# Patient Record
Sex: Female | Born: 1990 | Race: White | Hispanic: No | Marital: Married | Smoking: Current every day smoker
Health system: Southern US, Community
[De-identification: ages and names within clinical notes are randomized; demographics above are authoritative.]

## PROBLEM LIST (undated history)

## (undated) ENCOUNTER — Inpatient Hospital Stay (HOSPITAL_COMMUNITY): Payer: Self-pay

## (undated) DIAGNOSIS — F419 Anxiety disorder, unspecified: Secondary | ICD-10-CM

## (undated) DIAGNOSIS — N3010 Interstitial cystitis (chronic) without hematuria: Secondary | ICD-10-CM

## (undated) DIAGNOSIS — M797 Fibromyalgia: Secondary | ICD-10-CM

## (undated) DIAGNOSIS — K219 Gastro-esophageal reflux disease without esophagitis: Secondary | ICD-10-CM

## (undated) DIAGNOSIS — K589 Irritable bowel syndrome without diarrhea: Secondary | ICD-10-CM

## (undated) DIAGNOSIS — E039 Hypothyroidism, unspecified: Secondary | ICD-10-CM

## (undated) DIAGNOSIS — R011 Cardiac murmur, unspecified: Secondary | ICD-10-CM

## (undated) DIAGNOSIS — E063 Autoimmune thyroiditis: Secondary | ICD-10-CM

## (undated) DIAGNOSIS — F411 Generalized anxiety disorder: Secondary | ICD-10-CM

## (undated) DIAGNOSIS — F32A Depression, unspecified: Secondary | ICD-10-CM

## (undated) DIAGNOSIS — F988 Other specified behavioral and emotional disorders with onset usually occurring in childhood and adolescence: Secondary | ICD-10-CM

## (undated) HISTORY — PX: NO PAST SURGERIES: SHX2092

## (undated) HISTORY — PX: APPENDECTOMY: SHX54

## (undated) HISTORY — PX: UPPER GI ENDOSCOPY: SHX6162

## (undated) HISTORY — PX: MYRINGOTOMY WITH TUBE PLACEMENT: SHX5663

## (undated) HISTORY — PX: OTHER SURGICAL HISTORY: SHX169

## (undated) HISTORY — PX: TYMPANOSTOMY TUBE PLACEMENT: SHX32

---
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
## 2011-01-20 ENCOUNTER — Encounter (HOSPITAL_COMMUNITY): Payer: Self-pay | Admitting: Psychiatry

## 2011-04-02 NOTE — Procedures (Signed)
EEG NUMBER:  [id number].   CLINICAL HISTORY:  A 20 year old with a bitemporal headaches and syncope.  Study is being done to look for the presence of seizures.   PROCEDURE:  The tracing is carried out on a 32-channel digital Cadwell  recorder reformatted to 16-channel montages with one devoted to EKG. The  patient was awake during the recording. The International 10/20 system of  lead placement used. The patient takes Topamax for her headaches.   DESCRIPTION OF FINDINGS:  Dominant frequency is 10 Hz 20 microvolt activity,  is well regulated, background activity is a mixture of under 20 microvolt  theta range components and frontally predominant beta.  Hyperventilation and  photic stimulation caused no significant change in background. The patient  does not change state of arousal.   EKG showed a regular sinus rhythm with ventricular response of 72 beats per  minute.   IMPRESSION:  Normal waking record.      Deanna Artis. Sharene Skeans, M.D.  Electronically Signed     ZOX:WRUE  D:  12/28/2005 22:50:08  T:  12/29/2005 11:37:41  Job #:  [id number]
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
## 2011-04-02 NOTE — Discharge Summary (Signed)
Linda [name], Linda [name] NO.:  [id number]   MEDICAL RECORD NO.:  [id number]           PATIENT TYPE:  OBV   LOCATION:  A315                          FACILITY:  [facility]   PHYSICIAN:  Donna Bernard, M.D.DATE OF BIRTH:  May 07, 1991   DATE OF ADMISSION:  DATE OF DISCHARGE:  01/10/2007LH                                 DISCHARGE SUMMARY   FINAL DIAGNOSES:  1.  Near syncope.  2.  Hyperventilation.  3.  Acute anxiety.  4.  Headaches.   DISPOSITION:  1.  Patient is discharged home.  2.  Patient to remain out of school today and return tomorrow.  3.  Follow up with our office as scheduled.  4.  Xanax 0.5 mg #15 one as needed for anxiety, may repeat one in 6 hours.      Use sparingly.   HISTORY AND PHYSICAL:  For initial H&P, please see H&P as dictated.   HOSPITAL COURSE:  This patient is a 20 year old white female who has had  several spells in the last six weeks that sounded pretty much like vasovagal  episodes accompanied by near syncope and high anxiety and headaches.  She  was in the midst of workup.  We were due to do an MRI and MRA soon on her  for this.  She is also due to see a Neurologist soon.  She came to the  emergency room after having suffered a particularly severe spell (please see  H&P for further details).  When ER was preparing to discharge her, the  patient nearly fell out and nearly passed out and developed crying and  shaking and was brought in for observation.  I saw the patient that evening  and literally sat at the bedside for an entire hour discussing with the  family the nature of vasovagal episodes and the many varying symptoms that  can occur with this.  I feel this is a high likelihood.  The patient has  done well in the hospital.  We did use low-dose Xanax and Reglan and  __________ for symptomatology.  Today, the patient is feeling much better.  Her appetite is back.  Her neurological and physiological exam is within  normal limits.  The  patient is discharged home.   DIAGNOSIS:  Same.   DISPOSITION:  As noted above.     Donna Bernard, M.D.  Electronically Signed    WSL/MEDQ  D:  11/24/2005  T:  11/24/2005  Job:  [id number]

## 2011-09-08 ENCOUNTER — Encounter (INDEPENDENT_AMBULATORY_CARE_PROVIDER_SITE_OTHER): Payer: 59 | Admitting: Psychiatry

## 2011-09-08 DIAGNOSIS — F909 Attention-deficit hyperactivity disorder, unspecified type: Secondary | ICD-10-CM

## 2011-10-13 ENCOUNTER — Encounter (HOSPITAL_COMMUNITY): Payer: 59 | Admitting: Psychiatry

## 2011-10-16 ENCOUNTER — Other Ambulatory Visit (HOSPITAL_COMMUNITY): Payer: Self-pay | Admitting: Psychiatry

## 2012-02-26 ENCOUNTER — Emergency Department (HOSPITAL_COMMUNITY)
Admission: EM | Admit: 2012-02-26 | Discharge: 2012-02-27 | Disposition: A | Discharge status: Home / Self Care | Payer: No Typology Code available for payment source | Attending: Emergency Medicine | Admitting: Emergency Medicine

## 2012-02-26 ENCOUNTER — Emergency Department (HOSPITAL_COMMUNITY): Payer: No Typology Code available for payment source

## 2012-02-26 ENCOUNTER — Encounter (HOSPITAL_COMMUNITY): Payer: Self-pay | Admitting: *Deleted

## 2012-02-26 DIAGNOSIS — R42 Dizziness and giddiness: Secondary | ICD-10-CM | POA: Insufficient documentation

## 2012-02-26 DIAGNOSIS — S139XXA Sprain of joints and ligaments of unspecified parts of neck, initial encounter: Secondary | ICD-10-CM | POA: Insufficient documentation

## 2012-02-26 DIAGNOSIS — S0990XA Unspecified injury of head, initial encounter: Secondary | ICD-10-CM | POA: Insufficient documentation

## 2012-02-26 DIAGNOSIS — M542 Cervicalgia: Secondary | ICD-10-CM | POA: Insufficient documentation

## 2012-02-26 DIAGNOSIS — S161XXA Strain of muscle, fascia and tendon at neck level, initial encounter: Secondary | ICD-10-CM

## 2012-02-26 DIAGNOSIS — R51 Headache: Secondary | ICD-10-CM | POA: Insufficient documentation
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
## 2012-02-27 MED ORDER — HYDROCODONE-ACETAMINOPHEN 5-325 MG PO TABS: 1.0000 | ORAL_TABLET | Freq: Four times a day (QID) | ORAL | Status: AC | PRN

## 2012-02-27 MED ORDER — HYDROCODONE-ACETAMINOPHEN 5-325 MG PO TABS
1.0000 | ORAL_TABLET | Freq: Once | ORAL | Status: DC
  Filled 2012-02-27: qty 1

## 2012-02-27 MED ORDER — NAPROXEN 500 MG PO TABS: 500.0000 mg | ORAL_TABLET | Freq: Two times a day (BID) | ORAL | Status: DC
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
## 2012-03-07 ENCOUNTER — Ambulatory Visit (HOSPITAL_COMMUNITY)
Admission: RE | Admit: 2012-03-07 | Discharge: 2012-03-07 | Disposition: A | Discharge status: Home / Self Care | Payer: No Typology Code available for payment source | Source: Ambulatory Visit | Attending: Family Medicine | Admitting: Family Medicine

## 2012-03-07 DIAGNOSIS — M542 Cervicalgia: Secondary | ICD-10-CM | POA: Insufficient documentation

## 2012-03-07 DIAGNOSIS — IMO0002 Reserved for concepts with insufficient information to code with codable children: Secondary | ICD-10-CM | POA: Insufficient documentation

## 2012-03-07 DIAGNOSIS — M25519 Pain in unspecified shoulder: Secondary | ICD-10-CM | POA: Insufficient documentation

## 2012-03-07 DIAGNOSIS — S161XXA Strain of muscle, fascia and tendon at neck level, initial encounter: Secondary | ICD-10-CM | POA: Insufficient documentation

## 2012-03-07 DIAGNOSIS — IMO0001 Reserved for inherently not codable concepts without codable children: Secondary | ICD-10-CM | POA: Insufficient documentation

## 2012-03-07 DIAGNOSIS — M6281 Muscle weakness (generalized): Secondary | ICD-10-CM | POA: Insufficient documentation
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
## 2012-03-09 ENCOUNTER — Ambulatory Visit (HOSPITAL_COMMUNITY)
Admission: RE | Admit: 2012-03-09 | Discharge: 2012-03-09 | Disposition: A | Discharge status: Home / Self Care | Payer: No Typology Code available for payment source | Source: Ambulatory Visit | Attending: Family Medicine | Admitting: Family Medicine

## 2012-03-09 DIAGNOSIS — IMO0002 Reserved for concepts with insufficient information to code with codable children: Secondary | ICD-10-CM

## 2012-03-09 DIAGNOSIS — S161XXA Strain of muscle, fascia and tendon at neck level, initial encounter: Secondary | ICD-10-CM

## 2012-03-09 DIAGNOSIS — M25519 Pain in unspecified shoulder: Secondary | ICD-10-CM

## 2012-03-09 NOTE — Progress Notes (Signed)
Occupational Therapy Treatment  Patient Details  Name: Linda [name] MRN: [id number] Date of Birth: 1991/05/26  Today's Date: 03/09/2012 Time: 9147-8295 Time Calculation (min): 38 min Manual Therapy 38' Visit#: 2  of 8   Re-eval: 04/04/12    Subjective Symptoms/Limitations Symptoms: S:  I did the exercises, they made me sore, but not too sore. Pain Assessment Currently in Pain?: Yes Pain Score:   5 Pain Location: Shoulder Pain Orientation: Left Pain Type: Acute pain  Precautions/Restrictions     Exercise/Treatments    Manual Therapy Manual Therapy: Myofascial release Myofascial Release: MFR to platysmus, bilateral SCM, trapezius, pectoral region, chest release, scapular release, cervcial and bilateral arm manual traction, suboccipital release (vetebral artery test completed) completed this date to decrease pain and restrictions and increase pain free mobility in her cervical and shoulder regions.  [phone]  Occupational Therapy Assessment and Plan OT Assessment and Plan Clinical Impression Statement: A:  Patient with decreased pain and increased mobility this date.  Discussed making a conscious effort to swing arms when walking and to turn at neck rather than waist when looking to right and left.  Patient visually less guarded and more relaxed after MFR session. OT Plan: P:  Continue MFR to decrease pain and restrictions.  Add therapeutic exercises.   Goals Short Term Goals Time to Complete Short Term Goals: 2 weeks Short Term Goal 1: Patient will be educated on HEP. Short Term Goal 2: Patient will decrease fascial restrictions from moderate to min mod in her cervical and shoulder regions. Short Term Goal 3: Patient will decrease pain in cervical and left shoulder region to 4/10 when styling her hair. Short Term Goal 4: Patient will rotate at neck rather than hips when looking right or left 50% of the time. Short Term Goal 5: Patient will increase left shoulder AROM by  50% for increased ability to fasten her bra and comb her hair. Long Term Goals Time to Complete Long Term Goals: 4 weeks Long Term Goal 1: Patient will return to full duties at work and school. Long Term Goal 2: Patient will decrease fascial restrictions to minimal in her cervical and shoulder region. Long Term Goal 3: Patient will decrease pain to 1-2/10 in her cervical and shoulder regions while completing work and school activities. Long Term Goal 4: Patient will increase neck and shoulder AROM to WNL for increased ability to complete ADLs. Long Term Goal 5: Patient will increase left shoulder strength to 5/5 for increased ability to lift boxes at work. Additional Long Term Goals?: Yes Long Term Goal 6: Patient will rotate at neck rather than hips when looking right or left 100% of the time.  Problem List Patient Active Problem List  Diagnoses  . Cervical strain  . Sprain and strain of unspecified site of shoulder and upper arm  . Pain in joint, shoulder region    End of Session Activity Tolerance: Patient tolerated treatment well General Behavior During Session: [facility] for tasks performed Cognition: [facility] for tasks performed  GO No functional reporting required  Shirlean Mylar, OTR/L  03/09/2012, 9:47 AM

## 2012-03-14 ENCOUNTER — Ambulatory Visit (HOSPITAL_COMMUNITY)
Admission: RE | Admit: 2012-03-14 | Discharge: 2012-03-14 | Disposition: A | Discharge status: Home / Self Care | Payer: No Typology Code available for payment source | Source: Ambulatory Visit | Attending: Family Medicine | Admitting: Family Medicine

## 2012-03-14 DIAGNOSIS — IMO0002 Reserved for concepts with insufficient information to code with codable children: Secondary | ICD-10-CM

## 2012-03-14 DIAGNOSIS — M25519 Pain in unspecified shoulder: Secondary | ICD-10-CM

## 2012-03-14 DIAGNOSIS — S161XXA Strain of muscle, fascia and tendon at neck level, initial encounter: Secondary | ICD-10-CM

## 2012-03-14 NOTE — Progress Notes (Signed)
Occupational Therapy Treatment  Patient Details  Name: Linda [name] MRN: [id number] Date of Birth: Apr 09, 1991  Today's Date: 03/14/2012 Time: 1191-4782 Time Calculation (min): 69 min  Visit#: 3  of 8   Re-eval: 04/04/12 Manual Therapy 852-930 38' Therapeutic Exercise [id number] 31' Assessment Diagnosis: Cervical and Left Shoulder Strain Next MD Visit: 04/03/12  Subjective Symptoms/Limitations Symptoms: S:  It is a little better  Precautions/Restrictions     Exercise/Treatments Supine Protraction: PROM;AAROM;10 reps Horizontal ABduction: PROM;AAROM;10 reps External Rotation: PROM;AAROM;10 reps Internal Rotation: PROM;AAROM;10 reps Flexion: PROM;AAROM;10 reps ABduction: PROM;AAROM;10 reps Seated Elevation: AROM;10 reps Extension: AROM;10 reps Retraction: AROM;10 reps Row: AROM;10 reps   Therapy Ball Flexion: 10 reps ABduction: 10 reps   Cervical Exercises  Neck Flexion: AROM;10 reps Neck Extension: AROM;10 reps Neck Retraction: AROM;10 reps Neck Lateral Flexion - Right: AROM;10 reps Neck Lateral Flexion - Left: AROM;10 reps Neck Rotation - Right: AROM;10 reps Neck Rotation - Left: AROM;10 reps     Manual Therapy Manual Therapy: Myofascial release Myofascial Release: MFR and manual stretching to bilateral UE, cross hand chest release, scapular release, bilateral SCM and suboccipital release to decrease pain and restrictions and increase pain  free AROM and return to ADL activities.  Occupational Therapy Assessment and Plan OT Assessment and Plan Clinical Impression Statement: A:  Added multiple new exercises which patient tolerated well and combination of manual and exercise decreased pain to 1/10 Rehab Potential: Excellent OT Plan: P:  Increase reps with AAROM and AROM.   Goals Short Term Goals Time to Complete Short Term Goals: 2 weeks Short Term Goal 1: Patient will be educated on HEP. Short Term Goal 2: Patient will decrease fascial restrictions  from moderate to min mod in her cervical and shoulder regions. Short Term Goal 3: Patient will decrease pain in cervical and left shoulder region to 4/10 when styling her hair. Short Term Goal 4: Patient will rotate at neck rather than hips when looking right or left 50% of the time. Short Term Goal 5: Patient will increase left shoulder AROM by 50% for increased ability to fasten her bra and comb her hair. Long Term Goals Time to Complete Long Term Goals: 4 weeks Long Term Goal 1: Patient will return to full duties at work and school. Long Term Goal 2: Patient will decrease fascial restrictions to minimal in her cervical and shoulder region. Long Term Goal 3: Patient will decrease pain to 1-2/10 in her cervical and shoulder regions while completing work and school activities. Long Term Goal 4: Patient will increase neck and shoulder AROM to WNL for increased ability to complete ADLs. Long Term Goal 5: Patient will increase left shoulder strength to 5/5 for increased ability to lift boxes at work. Additional Long Term Goals?: Yes Long Term Goal 6: Patient will rotate at neck rather than hips when looking right or left 100% of the time.  Problem List Patient Active Problem List  Diagnoses  . Cervical strain  . Sprain and strain of unspecified site of shoulder and upper arm  . Pain in joint, shoulder region    End of Session Activity Tolerance: Patient tolerated treatment well General Behavior During Session: [facility] for tasks performed Cognition: [facility] for tasks performed  GO No functional reporting required   Dequavious Harshberger L. Sourish Allender, COTA/L  03/14/2012, 1:46 PM

## 2012-03-16 ENCOUNTER — Ambulatory Visit (HOSPITAL_COMMUNITY)
Admission: RE | Admit: 2012-03-16 | Discharge: 2012-03-16 | Disposition: A | Discharge status: Home / Self Care | Payer: No Typology Code available for payment source | Source: Ambulatory Visit | Attending: Family Medicine | Admitting: Family Medicine

## 2012-03-16 DIAGNOSIS — IMO0001 Reserved for inherently not codable concepts without codable children: Secondary | ICD-10-CM | POA: Insufficient documentation

## 2012-03-16 DIAGNOSIS — M6281 Muscle weakness (generalized): Secondary | ICD-10-CM | POA: Insufficient documentation

## 2012-03-16 DIAGNOSIS — S161XXA Strain of muscle, fascia and tendon at neck level, initial encounter: Secondary | ICD-10-CM

## 2012-03-16 DIAGNOSIS — M25519 Pain in unspecified shoulder: Secondary | ICD-10-CM | POA: Insufficient documentation

## 2012-03-16 DIAGNOSIS — M542 Cervicalgia: Secondary | ICD-10-CM | POA: Insufficient documentation

## 2012-03-16 DIAGNOSIS — IMO0002 Reserved for concepts with insufficient information to code with codable children: Secondary | ICD-10-CM

## 2012-03-16 NOTE — Progress Notes (Signed)
Occupational Therapy Treatment Patient Details  Name: Linda [name] MRN: [id number] Date of Birth: Jul 15, 1991  Today's Date: 03/16/2012 Time: 4696-2952 OT Time Calculation (min): 64 min Manual Therapy [phone] 27' Therapeutic Exercises [id number] 37' Visit#: 4  of 8   Re-eval: 04/04/12     Subjective Symptoms/Limitations Symptoms: S:  I feel like I am 80% better. Pain Assessment Currently in Pain?: No/denies Pain Score: 0-No pain  Precautions/Restrictions   N/A  Exercise/Treatments Supine Protraction: PROM;AROM;10 reps Horizontal ABduction: PROM;AROM;10 reps External Rotation: PROM;AROM;10 reps Internal Rotation: PROM;AROM;10 reps Flexion: PROM;AROM;10 reps ABduction: PROM;AROM;10 reps Seated Elevation: AROM;10 reps Extension: 10 reps;Theraband (red) Retraction: 10 reps;Theraband (red) Row: 10 reps;Theraband (red) Protraction: AROM;10 reps Horizontal ABduction: AROM;10 reps External Rotation: AROM;10 reps Internal Rotation: AROM;10 reps Flexion: AROM;10 reps Abduction: AROM;10 reps Therapy Ball Flexion: 15 reps ABduction: 15 reps ROM / Strengthening / Isometric Strengthening UBE (Upper Arm Bike): 3' and 3' 1.5 Thumb Tacks: 1' "W" Arms: 10 X to V Arms: 10 Prot/Ret//Elev/Dep: 1'   Cervical Exercises  Neck Flexion: AROM;10 reps Neck Extension: AROM;10 reps Neck Retraction: AROM;10 reps Neck Lateral Flexion - Right: AROM;10 reps Neck Lateral Flexion - Left: AROM;10 reps Neck Rotation - Right: AROM;10 reps Neck Rotation - Left: AROM;10 reps   Manual Therapy Manual Therapy: Myofascial release Myofascial Release: MFR and manual stretching to bilateral upper extremities, scapular release, bilateral SCM and suboccipital relese, manual traction to cervical and BUE.  [phone]  Occupational Therapy Assessment and Plan OT Assessment and Plan Clinical Impression Statement: A:  Patient using neck rotation more so than trunk rotation when looking right and left.  Less  guarding and more fluid arm movements when walking. OT Plan: P:  Attempt 1# in supine.   Goals Short Term Goals Time to Complete Short Term Goals: 2 weeks Short Term Goal 1: Patient will be educated on HEP. Short Term Goal 1 Progress: Progressing toward goal Short Term Goal 2: Patient will decrease fascial restrictions from moderate to min mod in her cervical and shoulder regions. Short Term Goal 2 Progress: Progressing toward goal Short Term Goal 3: Patient will decrease pain in cervical and left shoulder region to 4/10 when styling her hair. Short Term Goal 3 Progress: Progressing toward goal Short Term Goal 4: Patient will rotate at neck rather than hips when looking right or left 50% of the time. Short Term Goal 4 Progress: Progressing toward goal Short Term Goal 5: Patient will increase left shoulder AROM by 50% for increased ability to fasten her bra and comb her hair. Short Term Goal 5 Progress: Progressing toward goal Long Term Goals Time to Complete Long Term Goals: 4 weeks Long Term Goal 1: Patient will return to full duties at work and school. Long Term Goal 1 Progress: Progressing toward goal Long Term Goal 2: Patient will decrease fascial restrictions to minimal in her cervical and shoulder region. Long Term Goal 2 Progress: Progressing toward goal Long Term Goal 3: Patient will decrease pain to 1-2/10 in her cervical and shoulder regions while completing work and school activities. Long Term Goal 3 Progress: Progressing toward goal Long Term Goal 4: Patient will increase neck and shoulder AROM to WNL for increased ability to complete ADLs. Long Term Goal 4 Progress: Progressing toward goal Long Term Goal 5: Patient will increase left shoulder strength to 5/5 for increased ability to lift boxes at work. Long Term Goal 5 Progress: Progressing toward goal Additional Long Term Goals?: Yes Long Term Goal 6: Patient will rotate at  neck rather than hips when looking right or left  100% of the time. Long Term Goal 6 Progress: Progressing toward goal  Problem List Patient Active Problem List  Diagnoses  . Cervical strain  . Sprain and strain of unspecified site of shoulder and upper arm  . 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  GO No functional reporting required  Shirlean Mylar, OTR/L  03/16/2012, 10:17 AM

## 2012-03-21 ENCOUNTER — Ambulatory Visit (HOSPITAL_COMMUNITY): Payer: No Typology Code available for payment source | Admitting: Occupational Therapy

## 2012-03-23 ENCOUNTER — Ambulatory Visit (HOSPITAL_COMMUNITY)
Admission: RE | Admit: 2012-03-23 | Discharge: 2012-03-23 | Disposition: A | Discharge status: Home / Self Care | Payer: No Typology Code available for payment source | Source: Ambulatory Visit | Attending: Family Medicine | Admitting: Family Medicine

## 2012-03-23 DIAGNOSIS — M25519 Pain in unspecified shoulder: Secondary | ICD-10-CM

## 2012-03-23 DIAGNOSIS — IMO0002 Reserved for concepts with insufficient information to code with codable children: Secondary | ICD-10-CM

## 2012-03-23 DIAGNOSIS — S161XXA Strain of muscle, fascia and tendon at neck level, initial encounter: Secondary | ICD-10-CM

## 2012-03-23 NOTE — Progress Notes (Signed)
Occupational Therapy Treatment Patient Details  Name: Linda [name] MRN: [id number] Date of Birth: 31-Jul-1991  Today's Date: 03/23/2012 Time: 4098-1191 OT Time Calculation (min): 58 min Manual THerapy 852-920 28' Therapeutic Exercises 920-950 30' Visit#: 5  of 8   Re-eval: 04/04/12    Subjective Symptoms/Limitations Symptoms: S:  I feel like I am 90% better. Pain Assessment Currently in Pain?: No/denies Pain Score: 0-No pain Pain Orientation: Left Pain Type: Acute pain O:  Exercise/Treatments Seated Extension: 15 reps;Theraband (green) Retraction: 15 reps;Theraband (green) Row: 15 reps;Theraband (green) Protraction: Strengthening;10 reps Protraction Weight (lbs): 1 Horizontal ABduction: Strengthening;10 reps Horizontal ABduction Weight (lbs): 1 External Rotation: Strengthening;10 reps External Rotation Weight (lbs): 1 Internal Rotation: Strengthening;10 reps Internal Rotation Weight (lbs): 1 Flexion: Strengthening;10 reps Flexion Weight (lbs): 1 Abduction: Strengthening;10 reps ABduction Weight (lbs): 1 Therapy Ball Flexion: Other (comment) (dc) ABduction: Other (comment) (dc) ROM / Strengthening / Isometric Strengthening UBE (Upper Arm Bike): 3' and 3' 2.0 Cybex Press: 1.5 plate;15 reps Cybex Row: 1.5 plate;15 reps Thumb Tacks: 1' "W" Arms: 1# x 15 X to V Arms: 1# x 15 Prot/Ret//Elev/Dep: 1'      Manual Therapy Manual Therapy: Myofascial release Myofascial Release: MFR and manual stretching to bilateral upper extremities, scapular release, bilateral SCM and suboccipital release, manual traction to cervical region and LUE.  [phone]  Occupational Therapy Assessment and Plan OT Assessment and Plan Clinical Impression Statement: A: Minimal fascial restrictions noted this date.  Transitioned to strengthening rather than mobility exercises. OT Plan: P:  Increase to 2# with seated strengthening as tolerated.   Goals Short Term Goals Time to Complete Short  Term Goals: 2 weeks Short Term Goal 1: Patient will be educated on HEP. Short Term Goal 1 Progress: Progressing toward goal Short Term Goal 2: Patient will decrease fascial restrictions from moderate to min mod in her cervical and shoulder regions. Short Term Goal 2 Progress: Progressing toward goal Short Term Goal 3: Patient will decrease pain in cervical and left shoulder region to 4/10 when styling her hair. Short Term Goal 3 Progress: Progressing toward goal Short Term Goal 4: Patient will rotate at neck rather than hips when looking right or left 50% of the time. Short Term Goal 4 Progress: Progressing toward goal Short Term Goal 5: Patient will increase left shoulder AROM by 50% for increased ability to fasten her bra and comb her hair. Short Term Goal 5 Progress: Progressing toward goal Long Term Goals Time to Complete Long Term Goals: 4 weeks Long Term Goal 1: Patient will return to full duties at work and school. Long Term Goal 1 Progress: Progressing toward goal Long Term Goal 2: Patient will decrease fascial restrictions to minimal in her cervical and shoulder region. Long Term Goal 2 Progress: Progressing toward goal Long Term Goal 3: Patient will decrease pain to 1-2/10 in her cervical and shoulder regions while completing work and school activities. Long Term Goal 3 Progress: Progressing toward goal Long Term Goal 4: Patient will increase neck and shoulder AROM to WNL for increased ability to complete ADLs. Long Term Goal 4 Progress: Progressing toward goal Long Term Goal 5: Patient will increase left shoulder strength to 5/5 for increased ability to lift boxes at work. Long Term Goal 5 Progress: Progressing toward goal Additional Long Term Goals?: Yes Long Term Goal 6: Patient will rotate at neck rather than hips when looking right or left 100% of the time. Long Term Goal 6 Progress: Progressing toward goal  Problem List Patient Active  Problem List  Diagnoses  . Cervical  strain  . Sprain and strain of unspecified site of shoulder and upper arm  . Pain in joint, shoulder region    End of Session Activity Tolerance: Patient tolerated treatment well General Behavior During Session: [facility] for tasks performed Cognition: [facility] for tasks performed  GO No functional reporting required  Shirlean Mylar, OTR/L  03/23/2012, 10:30 AM

## 2012-03-28 ENCOUNTER — Telehealth (HOSPITAL_COMMUNITY): Payer: Self-pay

## 2012-03-28 ENCOUNTER — Ambulatory Visit (HOSPITAL_COMMUNITY): Payer: No Typology Code available for payment source | Admitting: Occupational Therapy

## 2012-03-30 ENCOUNTER — Ambulatory Visit (HOSPITAL_COMMUNITY): Payer: No Typology Code available for payment source | Admitting: Specialist

## 2012-04-04 ENCOUNTER — Ambulatory Visit (HOSPITAL_COMMUNITY): Payer: No Typology Code available for payment source | Admitting: Occupational Therapy

## 2012-04-06 ENCOUNTER — Ambulatory Visit (HOSPITAL_COMMUNITY): Payer: No Typology Code available for payment source | Admitting: Specialist

## 2012-04-11 ENCOUNTER — Ambulatory Visit (HOSPITAL_COMMUNITY)
Admission: RE | Admit: 2012-04-11 | Discharge: 2012-04-11 | Disposition: A | Discharge status: Home / Self Care | Payer: No Typology Code available for payment source | Source: Ambulatory Visit | Attending: Family Medicine | Admitting: Family Medicine

## 2012-04-11 ENCOUNTER — Ambulatory Visit (HOSPITAL_COMMUNITY): Payer: No Typology Code available for payment source | Admitting: Occupational Therapy

## 2012-04-11 DIAGNOSIS — S161XXA Strain of muscle, fascia and tendon at neck level, initial encounter: Secondary | ICD-10-CM

## 2012-04-11 DIAGNOSIS — IMO0002 Reserved for concepts with insufficient information to code with codable children: Secondary | ICD-10-CM

## 2012-04-11 DIAGNOSIS — M25519 Pain in unspecified shoulder: Secondary | ICD-10-CM

## 2012-04-11 NOTE — Progress Notes (Signed)
Occupational Therapy Treatment Patient Details  Name: JELITZA MANNINEN MRN: [id number] Date of Birth: September 01, 1991  Today's Date: 04/11/2012 Time: 4098-1191 OT Time Calculation (min): 22 min  Visit#: 6  of 8   Re-eval:   Assessment Diagnosis: Cervical and Left Shoulder Strain   Subjective Symptoms/Limitations Symptoms: S:  I havn't had any pain.  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 Pain Assessment Currently in Pain?: No/denies  Precautions/Restrictions     Occupational Therapy Assessment and Plan OT Assessment and Plan Clinical Impression Statement: A:  See progress note Rehab Potential: Excellent OT Plan: P:  D/C to HEP   Goals Short Term Goals Time to Complete Short Term Goals: 2 weeks Short Term Goal 1: Patient will be educated on HEP. Short Term Goal 1 Progress: Met Short Term Goal 2: Patient will decrease fascial restrictions from moderate to min mod in her cervical and shoulder regions. Short Term Goal 2 Progress: Met Short Term Goal 3: Patient will decrease pain in cervical and left shoulder region to 4/10 when styling her hair. Short Term Goal 3 Progress: Met Short Term Goal 4: Patient will rotate at neck rather than hips when looking right or left 50% of the time. Short Term Goal 4 Progress: Met Short Term Goal 5: Patient will increase left shoulder AROM by 50% for increased ability to fasten her bra and comb her hair. Short Term Goal 5 Progress: Met Long Term Goals Time to Complete Long Term Goals: 4 weeks Long Term Goal 1: Patient will return to full duties at work and school. Long Term Goal 1 Progress: Met Long Term Goal 2: Patient will decrease fascial restrictions to minimal in her cervical and shoulder region. Long Term Goal 2 Progress: Met Long Term Goal 3: Patient will decrease pain to 1-2/10 in her cervical and shoulder regions while completing work and school activities. Long Term Goal 3 Progress: Met Long Term Goal 4: Patient will increase neck and  shoulder AROM to WNL for increased ability to complete ADLs. Long Term Goal 4 Progress: Met Long Term Goal 5: Patient will increase left shoulder strength to 5/5 for increased ability to lift boxes at work. Additional Long Term Goals?: Yes Long Term Goal 6: Patient will rotate at neck rather than hips when looking right or left 100% of the time. Long Term Goal 6 Progress: Met  Problem List Patient Active Problem List  Diagnoses  . Cervical strain  . Sprain and strain of unspecified site of shoulder and upper arm  . Pain in joint, shoulder region    End of Session Activity Tolerance: Patient tolerated treatment well General Behavior During Session: [facility] for tasks performed Cognition: [facility] for tasks performed OT Plan of Care OT Home Exercise Plan: Educated on cervical stretches.  Written, demonstration and verbal instructions given to patient.    Trai Ells L. Butch Otterson, COTA/L  04/11/2012, 10:04 AM

## 2012-04-13 ENCOUNTER — Ambulatory Visit (HOSPITAL_COMMUNITY): Payer: No Typology Code available for payment source | Admitting: Specialist

## 2012-06-20 ENCOUNTER — Other Ambulatory Visit: Payer: Self-pay | Admitting: Family Medicine

## 2012-06-20 DIAGNOSIS — R102 Pelvic and perineal pain: Secondary | ICD-10-CM

## 2012-06-22 ENCOUNTER — Ambulatory Visit (HOSPITAL_COMMUNITY)
Admission: RE | Admit: 2012-06-22 | Discharge: 2012-06-22 | Disposition: A | Discharge status: Home / Self Care | Payer: 59 | Source: Ambulatory Visit | Attending: Family Medicine | Admitting: Family Medicine

## 2012-06-22 ENCOUNTER — Other Ambulatory Visit: Payer: Self-pay | Admitting: Family Medicine

## 2012-06-22 ENCOUNTER — Ambulatory Visit (HOSPITAL_COMMUNITY): Payer: 59

## 2012-06-22 ENCOUNTER — Other Ambulatory Visit (HOSPITAL_COMMUNITY): Payer: Self-pay | Admitting: Family Medicine

## 2012-06-22 DIAGNOSIS — R102 Pelvic and perineal pain: Secondary | ICD-10-CM

## 2012-06-22 DIAGNOSIS — R300 Dysuria: Secondary | ICD-10-CM | POA: Insufficient documentation

## 2012-06-22 DIAGNOSIS — N949 Unspecified condition associated with female genital organs and menstrual cycle: Secondary | ICD-10-CM | POA: Insufficient documentation

## 2012-09-28 ENCOUNTER — Inpatient Hospital Stay (HOSPITAL_COMMUNITY): Payer: 59

## 2012-09-28 ENCOUNTER — Inpatient Hospital Stay (HOSPITAL_COMMUNITY)
Admission: AD | Admit: 2012-09-28 | Discharge: 2012-09-28 | Disposition: A | Discharge status: Home / Self Care | Payer: 59 | Source: Ambulatory Visit | Attending: Family Medicine | Admitting: Family Medicine

## 2012-09-28 ENCOUNTER — Encounter (HOSPITAL_COMMUNITY): Payer: Self-pay

## 2012-09-28 DIAGNOSIS — R109 Unspecified abdominal pain: Secondary | ICD-10-CM | POA: Insufficient documentation

## 2012-09-28 DIAGNOSIS — O26899 Other specified pregnancy related conditions, unspecified trimester: Secondary | ICD-10-CM

## 2012-09-28 DIAGNOSIS — O99891 Other specified diseases and conditions complicating pregnancy: Secondary | ICD-10-CM | POA: Insufficient documentation

## 2012-09-28 DIAGNOSIS — N949 Unspecified condition associated with female genital organs and menstrual cycle: Secondary | ICD-10-CM

## 2012-09-28 HISTORY — DX: Irritable bowel syndrome, unspecified: K58.9

## 2012-09-28 LAB — CBC
Hemoglobin: 12.2 g/dL (ref 12.0–15.0)
MCH: 28.8 pg (ref 26.0–34.0)
MCHC: 32.8 g/dL (ref 30.0–36.0)
Platelets: 283 10*3/uL (ref 150–400)
RDW: 12.6 % (ref 11.5–15.5)
WBC: 10.1 10*3/uL (ref 4.0–10.5)

## 2012-09-28 LAB — URINE MICROSCOPIC-ADD ON

## 2012-09-28 LAB — URINALYSIS, ROUTINE W REFLEX MICROSCOPIC
Nitrite: NEGATIVE
Protein, ur: NEGATIVE mg/dL
Urobilinogen, UA: 0.2 mg/dL (ref 0.0–1.0)

## 2012-09-28 NOTE — MAU Note (Signed)
3 positive pregnancy tests at home. Lower abdominal & vaginal pain x1 week, sharp and intermittent. Denies vaginal bleeding. Vaginal discharge is clear, no odor.

## 2012-09-28 NOTE — Discharge Instructions (Signed)
Pregnancy - First Trimester During sexual intercourse, millions of sperm go into the vagina. Only 1 sperm will penetrate and fertilize the female egg while it is in the Fallopian tube. One week later, the fertilized egg implants into the wall of the uterus. An embryo begins to develop into a baby. At 6 to 8 weeks, the eyes and face are formed and the heartbeat can be seen on ultrasound. At the end of 12 weeks (first trimester), all the baby's organs are formed. Now that you are pregnant, you will want to do everything you can to have a healthy baby. Two of the most important things are to get good prenatal care and follow your caregiver's instructions. Prenatal care is all the medical care you receive before the baby's birth. It is given to prevent, find, and treat problems during the pregnancy and childbirth. PRENATAL EXAMS  During prenatal visits, your weight, blood pressure and urine are checked. This is done to make sure you are healthy and progressing normally during the pregnancy.  A pregnant woman should gain 25 to 35 pounds during the pregnancy. However, if you are over weight or underweight, your caregiver will advise you regarding your weight.  Your caregiver will ask and answer questions for you.  Blood work, cervical cultures, other necessary tests and a Pap test are done during your prenatal exams. These tests are done to check on your health and the probable health of your baby. Tests are strongly recommended and done for HIV with your permission. This is the virus that causes AIDS. These tests are done because medications can be given to help prevent your baby from being born with this infection should you have been infected without knowing it. Blood work is also used to find out your blood type, previous infections and follow your blood levels (hemoglobin).  Low hemoglobin (anemia) is common during pregnancy. Iron and vitamins are given to help prevent this. Later in the pregnancy, blood  tests for diabetes will be done along with any other tests if any problems develop. You may need tests to make sure you and the baby are doing well.  You may need other tests to make sure you and the baby are doing well. CHANGES DURING THE FIRST TRIMESTER (THE FIRST 3 MONTHS OF PREGNANCY) Your body goes through many changes during pregnancy. They vary from person to person. Talk to your caregiver about changes you notice and are concerned about. Changes can include:  Your menstrual period stops.  The egg and sperm carry the genes that determine what you look like. Genes from you and your partner are forming a baby. The female genes determine whether the baby is a boy or a girl.  Your body increases in girth and you may feel bloated.  Feeling sick to your stomach (nauseous) and throwing up (vomiting). If the vomiting is uncontrollable, call your caregiver.  Your breasts will begin to enlarge and become tender.  Your nipples may stick out more and become darker.  The need to urinate more. Painful urination may mean you have a bladder infection.  Tiring easily.  Loss of appetite.  Cravings for certain kinds of food.  At first, you may gain or lose a couple of pounds.  You may have changes in your emotions from day to day (excited to be pregnant or concerned something may go wrong with the pregnancy and baby).  You may have more vivid and strange dreams. HOME CARE INSTRUCTIONS   It is very important   to avoid all smoking, alcohol and un-prescribed drugs during your pregnancy. These affect the formation and growth of the baby. Avoid chemicals while pregnant to ensure the delivery of a healthy infant.  Start your prenatal visits by the 12th week of pregnancy. They are usually scheduled monthly at first, then more often in the last 2 months before delivery. Keep your caregiver's appointments. Follow your caregiver's instructions regarding medication use, blood and lab tests, exercise, and  diet.  During pregnancy, you are providing food for you and your baby. Eat regular, well-balanced meals. Choose foods such as meat, fish, milk and other low fat dairy products, vegetables, fruits, and whole-grain breads and cereals. Your caregiver will tell you of the ideal weight gain.  You can help morning sickness by keeping soda crackers at the bedside. Eat a couple before arising in the morning. You may want to use the crackers without salt on them.  Eating 4 to 5 small meals rather than 3 large meals a day also may help the nausea and vomiting.  Drinking liquids between meals instead of during meals also seems to help nausea and vomiting.  A physical sexual relationship may be continued throughout pregnancy if there are no other problems. Problems may be early (premature) leaking of amniotic fluid from the membranes, vaginal bleeding, or belly (abdominal) pain.  Exercise regularly if there are no restrictions. Check with your caregiver or physical therapist if you are unsure of the safety of some of your exercises. Greater weight gain will occur in the last 2 trimesters of pregnancy. Exercising will help:  Control your weight.  Keep you in shape.  Prepare you for labor and delivery.  Help you lose your pregnancy weight after you deliver your baby.  Wear a good support or jogging bra for breast tenderness during pregnancy. This may help if worn during sleep too.  Ask when prenatal classes are available. Begin classes when they are offered.  Do not use hot tubs, steam rooms or saunas.  Wear your seat belt when driving. This protects you and your baby if you are in an accident.  Avoid raw meat, uncooked cheese, cat litter boxes and soil used by cats throughout the pregnancy. These carry germs that can cause birth defects in the baby.  The first trimester is a good time to visit your dentist for your dental health. Getting your teeth cleaned is OK. Use a softer toothbrush and brush  gently during pregnancy.  Ask for help if you have financial, counseling or nutritional needs during pregnancy. Your caregiver will be able to offer counseling for these needs as well as refer you for other special needs.  Do not take any medications or herbs unless told by your caregiver.  Inform your caregiver if there is any mental or physical domestic violence.  Make a list of emergency phone numbers of family, friends, hospital, and police and fire departments.  Write down your questions. Take them to your prenatal visit.  Do not douche.  Do not cross your legs.  If you have to stand for long periods of time, rotate you feet or take small steps in a circle.  You may have more vaginal secretions that may require a sanitary pad. Do not use tampons or scented sanitary pads. MEDICATIONS AND DRUG USE IN PREGNANCY  Take prenatal vitamins as directed. The vitamin should contain 1 milligram of folic acid. Keep all vitamins out of reach of children. Only a couple vitamins or tablets containing iron may be   fatal to a baby or young child when ingested.  Avoid use of all medications, including herbs, over-the-counter medications, not prescribed or suggested by your caregiver. Only take over-the-counter or prescription medicines for pain, discomfort, or fever as directed by your caregiver. Do not use aspirin, ibuprofen, or naproxen unless directed by your caregiver.  Let your caregiver also know about herbs you may be using.  Alcohol is related to a number of birth defects. This includes fetal alcohol syndrome. All alcohol, in any form, should be avoided completely. Smoking will cause low birth rate and premature babies.  Street or illegal drugs are very harmful to the baby. They are absolutely forbidden. A baby born to an addicted mother will be addicted at birth. The baby will go through the same withdrawal an adult does.  Let your caregiver know about any medications that you have to take  and for what reason you take them. MISCARRIAGE IS COMMON DURING PREGNANCY A miscarriage does not mean you did something wrong. It is not a reason to worry about getting pregnant again. Your caregiver will help you with questions you may have. If you have a miscarriage, you may need minor surgery. SEEK MEDICAL CARE IF:  You have any concerns or worries during your pregnancy. It is better to call with your questions if you feel they cannot wait, rather than worry about them. SEEK IMMEDIATE MEDICAL CARE IF:   An unexplained oral temperature above 102 F (38.9 C) develops, or as your caregiver suggests.  You have leaking of fluid from the vagina (birth canal). If leaking membranes are suspected, take your temperature and inform your caregiver of this when you call.  There is vaginal spotting or bleeding. Notify your caregiver of the amount and how many pads are used.  You develop a bad smelling vaginal discharge with a change in the color.  You continue to feel sick to your stomach (nauseated) and have no relief from remedies suggested. You vomit blood or coffee ground-like materials.  You lose more than 2 pounds of weight in 1 week.  You gain more than 2 pounds of weight in 1 week and you notice swelling of your face, hands, feet, or legs.  You gain 5 pounds or more in 1 week (even if you do not have swelling of your hands, face, legs, or feet).  You get exposed to German measles and have never had them.  You are exposed to fifth disease or chickenpox.  You develop belly (abdominal) pain. Round ligament discomfort is a common non-cancerous (benign) cause of abdominal pain in pregnancy. Your caregiver still must evaluate this.  You develop headache, fever, diarrhea, pain with urination, or shortness of breath.  You fall or are in a car accident or have any kind of trauma.  There is mental or physical violence in your home. Document Released: 10/26/2001 Document Revised: 01/24/2012  Document Reviewed: 04/29/2009 [employer] Patient Information 2013 [employer].  

## 2012-09-28 NOTE — MAU Note (Signed)
Patient states she has noticed a sharp, stabbing pain in the vaginal area over last week. No bleeding associated, some increased frequency of urination.

## 2012-09-28 NOTE — MAU Provider Note (Signed)
History     CSN: [id number]  Arrival date and time: 09/28/12 2001   First Provider Initiated Contact with Patient 09/28/12 2117      Chief Complaint  Patient presents with  . Abdominal Pain   HPI  Linda [name] is a 21 y.o. G1P0 who presents today with abdominal pain X 1 week. She has a history of IBS.  LNMP: 07/29/12  Past Medical History  Diagnosis Date  . Asthma   . IBS (irritable bowel syndrome)     Past Surgical History  Procedure Date  . Upper gi endoscopy   . Tympanostomy tube placement     Family History  Problem Relation Age of Onset  . Cancer Paternal Uncle   . COPD Maternal Grandfather   . Stroke Paternal Grandfather     History  Substance Use Topics  . Smoking status: Former Smoker -- 0.5 packs/day    Types: Cigarettes  . Smokeless tobacco: Not on file  . Alcohol Use: No    Allergies:  Allergies  Allergen Reactions  . Sulfa Antibiotics Diarrhea and Nausea Only    Prescriptions prior to admission  Medication Sig Dispense Refill  . acetaminophen (TYLENOL) 500 MG tablet Take 500 mg by mouth daily as needed. For pain/headache      . albuterol (PROVENTIL HFA;VENTOLIN HFA) 108 (90 BASE) MCG/ACT inhaler Inhale 2 puffs into the lungs 2 (two) times daily as needed. For shortness of breath/asthma      . ibuprofen (ADVIL,MOTRIN) 200 MG tablet Take 800 mg by mouth daily as needed. For pain      . loratadine (CLARITIN) 10 MG tablet Take 10 mg by mouth daily.      . meclizine (ANTIVERT) 25 MG tablet Take 25 mg by mouth daily as needed. For dizziness      . Melatonin 3 MG TABS Take 1 tablet by mouth at bedtime.      . promethazine (PHENERGAN) 25 MG tablet Take 12.5 mg by mouth daily as needed. For nausea        Review of Systems  Constitutional: Negative for fever.  Gastrointestinal: Positive for nausea, diarrhea and constipation.  Genitourinary: Positive for dysuria and frequency. Negative for urgency.  Neurological: Positive for dizziness.  Negative for headaches.   Physical Exam   Blood pressure 117/69, pulse 86, temperature 98.4 F (36.9 C), temperature source Oral, resp. rate 18, height 5\' 5"  (1.651 m), weight 99.882 kg (220 lb 3.2 oz), last menstrual period 08/15/2012, SpO2 100.00%.  Physical Exam  Nursing note and vitals reviewed. Constitutional: She is oriented to person, place, and time. She appears well-developed and well-nourished.  Cardiovascular: Normal rate.   Respiratory: Effort normal.  GI: Soft. She exhibits no distension.  Genitourinary:        External: normal Vagina: normal. Scant amount of white discharge Cervix: pink, smooth. No CMT Uterus: 6 weeks size   Neurological: She is alert and oriented to person, place, and time.  Skin: Skin is warm and dry.    MAU Course  Procedures  *RADIOLOGY REPORT*  Clinical Data: early pregnancy and pain,pelvic pain, obesity; ;  OBSTETRIC <14 WK [place] AND TRANSVAGINAL OB US  Technique: Both transabdominal and transvaginal ultrasound  examinations were performed for complete evaluation of the  gestation as well as the maternal uterus, adnexal regions, and  pelvic cul-de-sac.  Comparison: 06/22/2012 pelvic ultrasound.  Findings: There is a single intrauterine gestation. Crown-rump  length is 3.8 mm for an estimated gestational age of [redacted] weeks 1  day.  Fetal heart rate 122 beats per minute. No subchorionic hemorrhage.  Ovaries are symmetric in size and echotexture. No adnexal masses.  No free fluid.  IMPRESSION:  6-week-1-day intrauterine pregnancy. Fetal heart rate 122 beats  per minute.  Original Report Authenticated By: Charlett Nose, M.D.  Results for orders placed during the hospital encounter of 09/28/12 (from the past 24 hour(s))  CBC     Status: Normal   Collection Time   09/28/12  8:07 PM      Component Value Range   WBC 10.1  4.0 - 10.5 K/uL   RBC 4.23  3.87 - 5.11 MIL/uL   Hemoglobin 12.2  12.0 - 15.0 g/dL   HCT 16.1  09.6 - 04.5 %   MCV 87.9   78.0 - 100.0 fL   MCH 28.8  26.0 - 34.0 pg   MCHC 32.8  30.0 - 36.0 g/dL   RDW 40.9  81.1 - 91.4 %   Platelets 283  150 - 400 K/uL  ABO/RH     Status: Normal (Preliminary result)   Collection Time   09/28/12  8:07 PM      Component Value Range   ABO/RH(D) A POS    HCG, QUANTITATIVE, PREGNANCY     Status: Abnormal   Collection Time   09/28/12  8:07 PM      Component Value Range   hCG, Beta Chain, Quant, S 17016 (*) <5 mIU/mL  URINALYSIS, ROUTINE W REFLEX MICROSCOPIC     Status: Abnormal   Collection Time   09/28/12  9:30 PM      Component Value Range   Color, Urine YELLOW  YELLOW   APPearance CLEAR  CLEAR   Specific Gravity, Urine 1.010  1.005 - 1.030   pH 6.5  5.0 - 8.0   Glucose, UA NEGATIVE  NEGATIVE mg/dL   Hgb urine dipstick TRACE (*) NEGATIVE   Bilirubin Urine NEGATIVE  NEGATIVE   Ketones, ur NEGATIVE  NEGATIVE mg/dL   Protein, ur NEGATIVE  NEGATIVE mg/dL   Urobilinogen, UA 0.2  0.0 - 1.0 mg/dL   Nitrite NEGATIVE  NEGATIVE   Leukocytes, UA SMALL (*) NEGATIVE  URINE MICROSCOPIC-ADD ON     Status: Abnormal   Collection Time   09/28/12  9:30 PM      Component Value Range   Squamous Epithelial / LPF FEW (*) RARE   WBC, UA 7-10  <3 WBC/hpf   RBC / HPF 0-2  <3 RBC/hpf   Bacteria, UA FEW (*) RARE     Assessment and Plan   1. Pelvic pain complicating pregnancy   Begin prenatal care as soon as possible.   [name] 09/28/2012, 9:19 PM

## 2012-09-28 NOTE — MAU Note (Signed)
LMP was beginning of October or some time in September but was not normal for patient. States she has irregular periods.

## 2012-09-29 LAB — GC/CHLAMYDIA PROBE AMP, GENITAL: GC Probe Amp, Genital: NEGATIVE

## 2012-09-29 NOTE — MAU Provider Note (Signed)
Chart reviewed and agree with management and plan.  

## 2012-09-30 LAB — URINE CULTURE

## 2012-11-23 ENCOUNTER — Encounter (HOSPITAL_COMMUNITY): Payer: Self-pay | Admitting: *Deleted

## 2012-11-23 ENCOUNTER — Inpatient Hospital Stay (HOSPITAL_COMMUNITY)
Admission: AD | Admit: 2012-11-23 | Discharge: 2012-11-23 | Disposition: A | Discharge status: Home / Self Care | Payer: 59 | Source: Ambulatory Visit | Attending: Obstetrics & Gynecology | Admitting: Obstetrics & Gynecology

## 2012-11-23 DIAGNOSIS — O26899 Other specified pregnancy related conditions, unspecified trimester: Secondary | ICD-10-CM

## 2012-11-23 DIAGNOSIS — R109 Unspecified abdominal pain: Secondary | ICD-10-CM

## 2012-11-23 DIAGNOSIS — O99891 Other specified diseases and conditions complicating pregnancy: Secondary | ICD-10-CM | POA: Insufficient documentation

## 2012-11-23 LAB — URINALYSIS, ROUTINE W REFLEX MICROSCOPIC
Ketones, ur: NEGATIVE mg/dL
Leukocytes, UA: NEGATIVE
Protein, ur: NEGATIVE mg/dL
Urobilinogen, UA: 0.2 mg/dL (ref 0.0–1.0)

## 2012-11-23 LAB — CBC
MCHC: 33.6 g/dL (ref 30.0–36.0)
MCV: 87.3 fL (ref 78.0–100.0)
Platelets: 240 10*3/uL (ref 150–400)
RDW: 13 % (ref 11.5–15.5)
WBC: 7.9 10*3/uL (ref 4.0–10.5)

## 2012-11-23 LAB — WET PREP, GENITAL
Clue Cells Wet Prep HPF POC: NONE SEEN
Trich, Wet Prep: NONE SEEN

## 2012-11-23 NOTE — MAU Provider Note (Addendum)
History     CSN: [id number]  Arrival date and time: 11/23/12 1150   First Provider Initiated Contact with Patient 11/23/12 1323      Chief Complaint  Patient presents with  . Abdominal Pain   HPI  Pt is [redacted]w[redacted]d pregnant and presents with diffuse lower abdominal pain and some more intense on her right lower quadrant.  The pain began yesterday morning and she took 2 Tylenol with relief.  The pain resumes last night and then resolved and pt was able to sleep last night.  She has not eaten today.  The pain is worse when she moves.  She also states that she has a clear vaginal discharge that burns.  Pt denies spotting or bleeding or UTI symptoms.  Pt has a hx of IBS controlled with Miralax. Pt has been taking Zofran for nausea.    Past Medical History  Diagnosis Date  . Asthma   . IBS (irritable bowel syndrome)     Past Surgical History  Procedure Date  . Upper gi endoscopy   . Tympanostomy tube placement     Family History  Problem Relation Age of Onset  . Cancer Paternal Uncle   . COPD Maternal Grandfather   . Stroke Paternal Grandfather     History  Substance Use Topics  . Smoking status: Former Smoker -- 0.5 packs/day    Types: Cigarettes  . Smokeless tobacco: Never Used  . Alcohol Use: No    Allergies:  Allergies  Allergen Reactions  . Sulfa Antibiotics Diarrhea and Nausea Only    Prescriptions prior to admission  Medication Sig Dispense Refill  . acetaminophen (TYLENOL) 500 MG tablet Take 500 mg by mouth daily as needed. For pain/headache      . albuterol (PROVENTIL HFA;VENTOLIN HFA) 108 (90 BASE) MCG/ACT inhaler Inhale 2 puffs into the lungs 2 (two) times daily as needed. For shortness of breath/asthma      . loratadine (CLARITIN) 10 MG tablet Take 10 mg by mouth daily.      . meclizine (ANTIVERT) 25 MG tablet Take 25 mg by mouth daily as needed. For dizziness      . promethazine (PHENERGAN) 25 MG tablet Take 12.5 mg by mouth daily as needed. For nausea         Review of Systems  Constitutional: Positive for chills. Negative for fever.  Gastrointestinal: Positive for nausea and abdominal pain.  Genitourinary: Negative for dysuria, urgency and frequency.  Neurological: Negative for headaches.   Physical Exam   Blood pressure 118/62, pulse 79, temperature 98.3 F (36.8 C), temperature source Oral, resp. rate 18, height 5\' 5"  (1.651 m), weight 211 lb (95.709 kg), last menstrual period 08/15/2012.  Physical Exam  Nursing note and vitals reviewed. Constitutional: She is oriented to person, place, and time. She appears well-developed and well-nourished.  HENT:  Head: Normocephalic.  Eyes: Pupils are equal, round, and reactive to light.  Cardiovascular: Normal rate.   Respiratory: Effort normal.  GI: Soft.  Genitourinary:       Cervix  Closed, long; no vaginal discharge noted; uterus gravid 14 week size nontender  Musculoskeletal: Normal range of motion.  Neurological: She is alert and oriented to person, place, and time.  Skin: Skin is warm and dry.  Psychiatric: She has a normal mood and affect.    MAU Course  Procedures  Results for orders placed during the hospital encounter of 11/23/12 (from the past 24 hour(s))  URINALYSIS, ROUTINE W REFLEX MICROSCOPIC  Status: Normal   Collection Time   11/23/12 12:16 PM      Component Value Range   Color, Urine YELLOW  YELLOW   APPearance CLEAR  CLEAR   Specific Gravity, Urine 1.020  1.005 - 1.030   pH 8.0  5.0 - 8.0   Glucose, UA NEGATIVE  NEGATIVE mg/dL   Hgb urine dipstick NEGATIVE  NEGATIVE   Bilirubin Urine NEGATIVE  NEGATIVE   Ketones, ur NEGATIVE  NEGATIVE mg/dL   Protein, ur NEGATIVE  NEGATIVE mg/dL   Urobilinogen, UA 0.2  0.0 - 1.0 mg/dL   Nitrite NEGATIVE  NEGATIVE   Leukocytes, UA NEGATIVE  NEGATIVE  CBC     Status: Abnormal   Collection Time   11/23/12  1:38 PM      Component Value Range   WBC 7.9  4.0 - 10.5 K/uL   RBC 3.95  3.87 - 5.11 MIL/uL   Hemoglobin 11.6 (*)  12.0 - 15.0 g/dL   HCT 16.1 (*) 09.6 - 04.5 %   MCV 87.3  78.0 - 100.0 fL   MCH 29.4  26.0 - 34.0 pg   MCHC 33.6  30.0 - 36.0 g/dL   RDW 40.9  81.1 - 91.4 %   Platelets 240  150 - 400 K/uL  WET PREP, GENITAL     Status: Abnormal   Collection Time   11/23/12  1:53 PM      Component Value Range   Yeast Wet Prep HPF POC NONE SEEN  NONE SEEN   Trich, Wet Prep NONE SEEN  NONE SEEN   Clue Cells Wet Prep HPF POC NONE SEEN  NONE SEEN   WBC, Wet Prep HPF POC FEW (*) NONE SEEN  GC/chlamydia culture pending  Assessment and Plan  Abdominal pain in pregnancy- discussed possible reasons- round ligament information given and comfort measures Confirmed IUP [redacted]w[redacted]d-  F/u for prenatal care ASAP and then will get anatomy scan with provider  [facility] 11/23/2012, 1:26 PM

## 2012-11-23 NOTE — MAU Note (Signed)
Started yesterday morning. Took 2 tylenol and it stopped, started again last night. Was able to sleep, but woke up this morning with pain.  Has been feeling really dehydrated, is burning up right now.  Been really nauseous. Denies diarrhea.

## 2012-12-02 NOTE — MAU Provider Note (Signed)
If pt returns to MAU, would do cervical exam.  Attestation of Attending Supervision of Advanced Practitioner (CNM/NP): Evaluation and management procedures were performed by the Advanced Practitioner under my supervision and collaboration. I have reviewed the Advanced Practitioner's note and chart, and I agree with the management and plan.  Masako Overall H. 12:18 PM

## 2012-12-05 NOTE — MAU Provider Note (Signed)
Attestation of Attending Supervision of Advanced Practitioner (CNM/NP): Evaluation and management procedures were performed by the Advanced Practitioner under my supervision and collaboration. I have reviewed the Advanced Practitioner's note and chart, and I agree with the management and plan.  Davidlee Jeanbaptiste H. 2:31 PM

## 2012-12-29 ENCOUNTER — Emergency Department (INDEPENDENT_AMBULATORY_CARE_PROVIDER_SITE_OTHER)
Admission: EM | Admit: 2012-12-29 | Discharge: 2012-12-29 | Disposition: A | Discharge status: Home / Self Care | Payer: 59 | Source: Home / Self Care | Attending: Family Medicine | Admitting: Family Medicine

## 2012-12-29 ENCOUNTER — Encounter (HOSPITAL_COMMUNITY): Payer: Self-pay | Admitting: *Deleted

## 2012-12-29 DIAGNOSIS — K219 Gastro-esophageal reflux disease without esophagitis: Secondary | ICD-10-CM

## 2012-12-29 DIAGNOSIS — J029 Acute pharyngitis, unspecified: Secondary | ICD-10-CM

## 2012-12-29 LAB — POCT INFECTIOUS MONO SCREEN: Mono Screen: NEGATIVE

## 2012-12-29 MED ORDER — BENZOCAINE 20 % MT GEL: 1.0000 "application " | Freq: Two times a day (BID) | OROMUCOSAL | Status: DC | PRN

## 2012-12-29 MED ORDER — FAMOTIDINE 10 MG PO TABS: 40.0000 mg | ORAL_TABLET | Freq: Two times a day (BID) | ORAL | Status: DC | PRN

## 2012-12-29 NOTE — ED Notes (Signed)
Patient states she went to doctor last Monday and was given Amoxicillin but has had no relief. She states she now has bilateral ear pain and fever/chills. Patient is 5 months pregnant.

## 2012-12-29 NOTE — ED Provider Notes (Signed)
History     CSN: [id number]  Arrival date & time 12/29/12  1717   First MD Initiated Contact with Patient 12/29/12 1723      Chief Complaint  Patient presents with  . URI    (Consider location/radiation/quality/duration/timing/severity/associated sxs/prior treatment) HPI Comments: 22 year old female 5 months pregnant, former smoker with history of asthma. Here complaining of sore throat for almost 2 weeks. Patient states she was seen by her primary care provider last Monday and was diagnosed with upper respiratory infection and had a prescription for amoxicillin the patient has taken 4 days. Denies relief of her sore throat. Patient reports symptoms were associated with subjective fever and chills initially. Has not have fever today she has not taken any medications for fever and her temperature here is 97.8 Fahrenheit. Reports bilateral ear pain and nasal congestion. Denies abdominal pain or rash. No headache, nausea, vomiting or diarrhea.   Patient admits to acid reflux and reports intermittent recent sternal burning sensation.  Not taking any medications for reflux.   Past Medical History  Diagnosis Date  . Asthma   . IBS (irritable bowel syndrome)     Past Surgical History  Procedure Laterality Date  . Upper gi endoscopy    . Tympanostomy tube placement      Family History  Problem Relation Age of Onset  . Cancer Paternal Uncle   . COPD Maternal Grandfather   . Stroke Paternal Grandfather     History  Substance Use Topics  . Smoking status: Former Smoker -- 0.50 packs/day    Types: Cigarettes  . Smokeless tobacco: Never Used  . Alcohol Use: No    OB History   Grav Para Term Preterm Abortions TAB SAB Ect Mult Living   1               Review of Systems  Constitutional: Negative for fever and appetite change.  HENT: Positive for ear pain, congestion and sore throat. Negative for trouble swallowing.   Gastrointestinal: Negative for nausea, vomiting, abdominal  pain and diarrhea.  Skin: Negative for rash.  All other systems reviewed and are negative.    Allergies  Sulfa antibiotics  Home Medications   Current Outpatient Rx  Name  Route  Sig  Dispense  Refill  . acetaminophen (TYLENOL) 500 MG tablet   Oral   Take 500 mg by mouth daily as needed. For pain/headache         . albuterol (PROVENTIL HFA;VENTOLIN HFA) 108 (90 BASE) MCG/ACT inhaler   Inhalation   Inhale 2 puffs into the lungs 2 (two) times daily as needed. For shortness of breath/asthma         . benzocaine (HURRICAINE) 20 % GEL   Mouth/Throat   Use as directed 1 application in the mouth or throat 2 (two) times daily as needed.   11.9 g   0   . famotidine (PEPCID) 10 MG tablet   Oral   Take 4 tablets (40 mg total) by mouth 2 (two) times daily as needed for heartburn.   20 tablet   0   . ondansetron (ZOFRAN) 4 MG tablet   Oral   Take 4 mg by mouth daily as needed. For nausea         . Phenazopyridine HCl (AZO TABS PO)   Oral   Take 1 tablet by mouth once.         . promethazine (PHENERGAN) 25 MG tablet   Oral   Take 12.5 mg by mouth  daily as needed. For nausea           BP 95/56  Pulse 88  Temp(Src) 97.8 F (36.6 C) (Oral)  Resp 18  SpO2 97%  LMP 08/15/2012  Physical Exam  Vitals reviewed. Constitutional: She is oriented to person, place, and time. She appears well-developed and well-nourished. No distress.  HENT:  Head: Normocephalic and atraumatic.  Nasal Congestion with erythema and swelling of nasal turbinates, clear rhinorrhea. pharyngeal erythema with few ulcerated areas no exudates. No uvula deviation. No trismus. TM's increased vascular markings and some dullness bilaterally appears to be clear fluid behind and some calcium deposits opacifying the membrane, no redness, no swelling.  Eyes: Conjunctivae and EOM are normal. Pupils are equal, round, and reactive to light. Right eye exhibits no discharge. Left eye exhibits no discharge. No  scleral icterus.  Neck: Neck supple. No thyromegaly present.  Cardiovascular: Normal rate, regular rhythm and normal heart sounds.   Pulmonary/Chest: Effort normal and breath sounds normal. No respiratory distress. She has no wheezes. She has no rales. She exhibits no tenderness.  Abdominal: Soft. There is no tenderness.  No HSM pregnant  Lymphadenopathy:    She has no cervical adenopathy.  Neurological: She is alert and oriented to person, place, and time.  Skin: No rash noted. She is not diaphoretic.    ED Course  Procedures (including critical care time)  Labs Reviewed  POCT INFECTIOUS MONO SCREEN  POCT RAPID STREP A ([facility]RG CARE ONLY)   No results found.   1. Pharyngitis   2. Acid reflux       MDM  Impress sore throat and pharyngitis related to findings on examination related to acid reflux. Strep and mono RapidTest were both normal. Reassuring physical exam. Supportive care about acid reflux during pregnancy discussed with patient and provided in writing. Also recommended to use over-the-counter TUMS 1-3 times a day as needed. If no improvement or worsening; prescribed Pepcid low dose 10 mg twice a day when necessary. Prescribed Orabase gel when necessary. I think is appropriate to complete amoxicillin for possible resolving ear infection although I don't think amoxicillin is going to improve her sore throat. Recommended to take prenatal vitamins and followup with her OB or primary care provider to monitor her symptoms.       Sharin Grave, MD 12/30/12 [phone]

## 2013-01-17 ENCOUNTER — Encounter (HOSPITAL_COMMUNITY): Payer: Self-pay | Admitting: *Deleted

## 2013-01-17 ENCOUNTER — Inpatient Hospital Stay (HOSPITAL_COMMUNITY)
Admission: AD | Admit: 2013-01-17 | Discharge: 2013-01-17 | Disposition: A | Discharge status: Home / Self Care | Payer: 59 | Source: Ambulatory Visit | Attending: Obstetrics and Gynecology | Admitting: Obstetrics and Gynecology

## 2013-01-17 DIAGNOSIS — R079 Chest pain, unspecified: Secondary | ICD-10-CM

## 2013-01-17 DIAGNOSIS — O99891 Other specified diseases and conditions complicating pregnancy: Secondary | ICD-10-CM | POA: Insufficient documentation

## 2013-01-17 DIAGNOSIS — K219 Gastro-esophageal reflux disease without esophagitis: Secondary | ICD-10-CM | POA: Insufficient documentation

## 2013-01-17 DIAGNOSIS — S29011A Strain of muscle and tendon of front wall of thorax, initial encounter: Secondary | ICD-10-CM

## 2013-01-17 HISTORY — DX: Cardiac murmur, unspecified: R01.1

## 2013-01-17 MED ORDER — FAMOTIDINE 20 MG PO TABS
20.0000 mg | ORAL_TABLET | Freq: Once | ORAL | Status: AC
  Administered 2013-01-17: 20 mg via ORAL
  Filled 2013-01-17: qty 1

## 2013-01-17 MED ORDER — ACETAMINOPHEN 325 MG PO TABS
650.0000 mg | ORAL_TABLET | Freq: Once | ORAL | Status: AC
  Administered 2013-01-17: 650 mg via ORAL
  Filled 2013-01-17: qty 2

## 2013-01-17 MED ORDER — CYCLOBENZAPRINE HCL 10 MG PO TABS: 10.0000 mg | ORAL_TABLET | Freq: Two times a day (BID) | ORAL | Status: DC | PRN

## 2013-01-17 MED ORDER — CYCLOBENZAPRINE HCL 10 MG PO TABS
10.0000 mg | ORAL_TABLET | Freq: Once | ORAL | Status: AC
  Administered 2013-01-17: 10 mg via ORAL
  Filled 2013-01-17: qty 1

## 2013-01-17 NOTE — MAU Note (Signed)
Started 3-4 days ago.  Feels like someone is reaching in grabbing and squeezing heart.  Pt has hx of asthma- states has been hard to breath.

## 2013-01-17 NOTE — MAU Provider Note (Signed)
History     CSN: [id number]  Arrival date and time: 01/17/13 4098   First Shlomo Seres Initiated Contact with Patient 01/17/13 1841      Chief Complaint  Patient presents with  . Chest Pain   HPI Ms. Linda [name] is a 22 y.o. G1P0 at [redacted]w[redacted]d who presents to MAU today with complaint of chest pain x 3-4 days. The pain is sharp and comes and goes. She states that she has had some difficulty breathing since the pain started occasionally. She rates the pain at 7/10 now. It is limited to the left side of her chest near the midline. The patient states that she had an episode of chest pain about 2 years ago and had EKG that showed "palpitations." That was performed at her PCP. She denies vaginal bleeding, vaginal discharge or abdominal pain. The patient called [facility] when the pain started and they told her to call her PCP. She called her PCP and he advised her to come here for evaluation. The patient states that she had URI within the last few weeks and has also had issues with reflex symptoms during the pregnancy that she has been taking Tums BID for without adequate control.   OB History   Grav Para Term Preterm Abortions TAB SAB Ect Mult Living   1               Past Medical History  Diagnosis Date  . Asthma   . IBS (irritable bowel syndrome)     Past Surgical History  Procedure Laterality Date  . Upper gi endoscopy    . Tympanostomy tube placement      Family History  Problem Relation Age of Onset  . Cancer Paternal Uncle   . COPD Maternal Grandfather   . Stroke Paternal Grandfather     History  Substance Use Topics  . Smoking status: Former Smoker -- 0.50 packs/day    Types: Cigarettes  . Smokeless tobacco: Never Used  . Alcohol Use: No    Allergies:  Allergies  Allergen Reactions  . Sulfa Antibiotics Diarrhea and Nausea Only    Prescriptions prior to admission  Medication Sig Dispense Refill  . acetaminophen (TYLENOL) 500 MG tablet Take 500 mg by  mouth daily as needed. For pain/headache      . albuterol (PROVENTIL HFA;VENTOLIN HFA) 108 (90 BASE) MCG/ACT inhaler Inhale 2 puffs into the lungs 2 (two) times daily as needed. For shortness of breath/asthma      . benzocaine (HURRICAINE) 20 % GEL Use as directed 1 application in the mouth or throat 2 (two) times daily as needed.  11.9 g  0  . famotidine (PEPCID) 10 MG tablet Take 4 tablets (40 mg total) by mouth 2 (two) times daily as needed for heartburn.  20 tablet  0  . ondansetron (ZOFRAN) 4 MG tablet Take 4 mg by mouth daily as needed. For nausea      . Phenazopyridine HCl (AZO TABS PO) Take 1 tablet by mouth once.      . promethazine (PHENERGAN) 25 MG tablet Take 12.5 mg by mouth daily as needed. For nausea        Review of Systems  Cardiovascular: Positive for chest pain and palpitations.  Gastrointestinal: Negative for abdominal pain.   Physical Exam   Blood pressure 119/55, pulse 97, temperature 98.1 F (36.7 C), temperature source Oral, resp. rate 20, last menstrual period 08/15/2012, SpO2 100.00%.  Physical Exam  Constitutional: She is oriented to person,  place, and time. She appears well-developed and well-nourished. No distress.  HENT:  Head: Normocephalic and atraumatic.  Cardiovascular: Normal rate, regular rhythm and normal heart sounds.   Respiratory: Effort normal and breath sounds normal. No respiratory distress.  GI: Soft. Bowel sounds are normal. She exhibits no distension and no mass. There is no tenderness. There is no rebound and no guarding.  Musculoskeletal:       Arms: Neurological: She is alert and oriented to person, place, and time.  Skin: Skin is warm and dry. No erythema.  Psychiatric: She has a normal mood and affect.   EKG - Normal sinus rhythm  MAU Course  Procedures None  MDM EKG - normal sinus rhythm Flexeril, Tylenol and Pepcid today in MAU for most likely cause of pain.  Possible muscular origin as pain is reproducible with  palpation History of Reflux with this pregnancy, not adequately controlled at this time Patient reports significant improvement of symptoms  Assessment and Plan  A: Chest wall strain Acid reflux  P: Discharge home Patient encouraged to continue tylenol as needed for discomfort Patient encouraged to pick up Rx for Pepcid previously prescribed for reflux Rx for Flexeril sent to patient's pharmacy, may be used PRN for chest discomfort AVS contains information about GERD diet Patient should keep follow-up with [facility] for prenatal care as scheduled Patient may return to MAU as needed or if her condition were to change or worsen   Freddi Starr, PA-C  01/17/2013, 6:47 PM

## 2013-01-17 NOTE — MAU Note (Signed)
Pt states she has been having chest pain for 3-4 days and pt states pain has become worse today. Pt was referred by primary doc after being referred to primary doc by ob doc.

## 2013-01-17 NOTE — Progress Notes (Signed)
Pt states she is feeling better after medication. Pt states her mother is on her way to pick pt up

## 2013-01-30 ENCOUNTER — Ambulatory Visit (INDEPENDENT_AMBULATORY_CARE_PROVIDER_SITE_OTHER): Payer: 59 | Admitting: Nurse Practitioner

## 2013-01-30 ENCOUNTER — Encounter: Payer: Self-pay | Admitting: Nurse Practitioner

## 2013-01-30 VITALS — BP 124/66 | Temp 98.3°F | Wt 220.0 lb

## 2013-01-30 DIAGNOSIS — J300 Vasomotor rhinitis: Secondary | ICD-10-CM

## 2013-01-30 DIAGNOSIS — K121 Other forms of stomatitis: Secondary | ICD-10-CM

## 2013-01-30 DIAGNOSIS — R609 Edema, unspecified: Secondary | ICD-10-CM

## 2013-01-30 DIAGNOSIS — O0001 Abdominal pregnancy with intrauterine pregnancy: Secondary | ICD-10-CM

## 2013-01-30 DIAGNOSIS — K1370 Unspecified lesions of oral mucosa: Secondary | ICD-10-CM

## 2013-01-30 LAB — POCT UA - MICROSCOPIC ONLY: WBC, Ur, HPF, POC: 0

## 2013-01-30 LAB — POCT UA - GLUCOSE/PROTEIN
Glucose, UA: NEGATIVE
Protein, UA: NEGATIVE

## 2013-01-30 NOTE — Progress Notes (Signed)
  Subjective:    Patient ID: Linda [name], female    DOB: [birth year], 22 y.o.   MRN: [id number]  HPI Subjective: Presents complaints of sore area on the left side of her tongue for the past 2 days. Was white in appearance last night, now is clear. Occasional pain depending on what she eats. No fever. No rash. Nausea but no vomiting. Now on Pepcid for her acid reflux. Mild head congestion. Some popping in the left ear, no pain. Occasional cough. Slight wheeze at times, has not needed her inhaler. Patient is 6 months pregnant. Off-and-on mild edema in the extremities more so in the feet. Gets regular followup with her OB doctor. No headache or photosensitivity.    Review of Systems     Objective:   Physical ExamObjective: NAD. Alert, oriented. TMs clear effusion, no erythema. Shallow minimally erythematous erosion noted on the left side of the tongue, oral mucosa clear. Pharynx clear. Neck supple with minimal adenopathy. Lungs clear. Heart regular rate rhythm. Lower extremities trace pitting edema. Strong pedal pulses. Toes warm with good capillary refill. UA negative for protein. Urine microscopic epi cells TNTC. BP 124/66.        Assessment & Plan:   Assessment: #1 peripheral edema #2 abdominal/intrauterine pregnancy #3 mouth ulcer resolving #4 vasomotor rhinitis Plan: #1 limit sodium in her diet. Continue regular followup with her obstetrician. Warning signs reviewed regarding preeclampsia. Benadryl mixed with Maalox 1:1 switch and spit when necessary. OTC meds for her sinus congestion per obstetrician. Call back by the end of the week if no improvement, sooner if worse. [facility]

## 2013-02-07 ENCOUNTER — Encounter (HOSPITAL_COMMUNITY): Payer: Self-pay | Admitting: *Deleted

## 2013-02-07 ENCOUNTER — Inpatient Hospital Stay (HOSPITAL_COMMUNITY)
Admission: AD | Admit: 2013-02-07 | Discharge: 2013-02-07 | Disposition: A | Discharge status: Home / Self Care | Payer: 59 | Source: Ambulatory Visit | Attending: Obstetrics & Gynecology | Admitting: Obstetrics & Gynecology

## 2013-02-07 DIAGNOSIS — J029 Acute pharyngitis, unspecified: Secondary | ICD-10-CM | POA: Insufficient documentation

## 2013-02-07 DIAGNOSIS — R030 Elevated blood-pressure reading, without diagnosis of hypertension: Secondary | ICD-10-CM | POA: Insufficient documentation

## 2013-02-07 DIAGNOSIS — J45901 Unspecified asthma with (acute) exacerbation: Secondary | ICD-10-CM | POA: Insufficient documentation

## 2013-02-07 DIAGNOSIS — B370 Candidal stomatitis: Secondary | ICD-10-CM | POA: Insufficient documentation

## 2013-02-07 DIAGNOSIS — M7989 Other specified soft tissue disorders: Secondary | ICD-10-CM | POA: Insufficient documentation

## 2013-02-07 DIAGNOSIS — O99891 Other specified diseases and conditions complicating pregnancy: Secondary | ICD-10-CM | POA: Insufficient documentation

## 2013-02-07 DIAGNOSIS — J069 Acute upper respiratory infection, unspecified: Secondary | ICD-10-CM | POA: Insufficient documentation

## 2013-02-07 HISTORY — DX: Interstitial cystitis (chronic) without hematuria: N30.10

## 2013-02-07 LAB — CBC
Hemoglobin: 10.8 g/dL — ABNORMAL LOW (ref 12.0–15.0)
Platelets: 237 10*3/uL (ref 150–400)
RBC: 3.72 MIL/uL — ABNORMAL LOW (ref 3.87–5.11)
WBC: 9.6 10*3/uL (ref 4.0–10.5)

## 2013-02-07 LAB — URINALYSIS, ROUTINE W REFLEX MICROSCOPIC
Bilirubin Urine: NEGATIVE
Leukocytes, UA: NEGATIVE
Nitrite: NEGATIVE
Specific Gravity, Urine: 1.015 (ref 1.005–1.030)
Urobilinogen, UA: 0.2 mg/dL (ref 0.0–1.0)
pH: 7.5 (ref 5.0–8.0)

## 2013-02-07 LAB — COMPREHENSIVE METABOLIC PANEL
ALT: 23 U/L (ref 0–35)
Calcium: 9.6 mg/dL (ref 8.4–10.5)
GFR calc Af Amer: >90 mL/min (ref >90)
Glucose, Bld: 80 mg/dL (ref 70–99)
Sodium: 138 mEq/L (ref 135–145)
Total Protein: 6.3 g/dL (ref 6.0–8.3)

## 2013-02-07 MED ORDER — FLUCONAZOLE 150 MG PO TABS
150.0000 mg | ORAL_TABLET | ORAL | Status: AC
  Administered 2013-02-07: 150 mg via ORAL
  Filled 2013-02-07: qty 1

## 2013-02-07 MED ORDER — LIDOCAINE VISCOUS 2 % MT SOLN: 20.0000 mL | OROMUCOSAL | Status: DC | PRN

## 2013-02-07 MED ORDER — LIDOCAINE VISCOUS 2 % MT SOLN
20.0000 mL | OROMUCOSAL | Status: DC
  Filled 2013-02-07: qty 20

## 2013-02-07 NOTE — MAU Note (Signed)
Patient states that she has been getting prenatal care with [facility] until about 3 weeks ago and she quit the practice. Is waiting for them to transfer her records to [facility] but has not been done yet so she is without a MD at this time. States she has asthma and has been taking a Z-pack for URI with 2 days left on RX(given by primary MD). States she has a sore throat, increased swelling and difficulty breathing at times. Inhaler does not work. Denies bleeding, leaking or contractions. Reports a little fetal movement today.

## 2013-02-07 NOTE — MAU Provider Note (Signed)
Chief Complaint:  Sore Throat and Leg Swelling   First Provider Initiated Contact with Patient 02/07/13 1920      HPI: Linda [name] is a 22 y.o. G1P0 at 69w2dwho presents to maternity admissions reporting upper respiratory infection x2 in last month, treated with abx by her primary care provider.  The first infection resolved but came back 2 weeks ago and has not improved.  She had a white irritated tongue last week and now has blisters on her tongue.  She has nasal and chest congestion and reports some SOB with this.  She has not had fever or chills at any point during her symptoms.  She has a hx of asthma treated in the past with inhaled steroids and albuterol, but has only used PRN albuterol for the last 2 years.  She is concerned about her leg swelling also and has heard that high blood pressure can be related to this.  Pt denies trying albuterol for respiratory difficulty.  She reports good fetal movement, denies LOF, vaginal bleeding, vaginal itching/burning, urinary symptoms, h/a, epigastric pain, visual disturbances, dizziness, n/v, or fever/chills.    Past Medical History: Past Medical History  Diagnosis Date  . Asthma   . IBS (irritable bowel syndrome)   . Heart murmur   . Interstitial cystitis     Past obstetric history: OB History   Grav Para Term Preterm Abortions TAB SAB Ect Mult Living   1              # Outc Date GA Lbr Len/2nd Wgt Sex Del Anes PTL Lv   1 CUR               Past Surgical History: Past Surgical History  Procedure Laterality Date  . Upper gi endoscopy    . Tympanostomy tube placement    . No past surgeries      Family History: Family History  Problem Relation Age of Onset  . Cancer Paternal Uncle   . COPD Maternal Grandfather   . Stroke Paternal Grandfather     Social History: History  Substance Use Topics  . Smoking status: Former Smoker -- 0.50 packs/day    Types: Cigarettes  . Smokeless tobacco: Never Used  . Alcohol Use: No     Allergies:  Allergies  Allergen Reactions  . Sulfa Antibiotics Diarrhea and Nausea Only    Meds:  Prescriptions prior to admission  Medication Sig Dispense Refill  . acetaminophen (TYLENOL) 500 MG tablet Take 500 mg by mouth daily as needed for pain. For pain/headache      . albuterol (PROVENTIL HFA;VENTOLIN HFA) 108 (90 BASE) MCG/ACT inhaler Inhale 2 puffs into the lungs 2 (two) times daily as needed. For shortness of breath/asthma      . famotidine (PEPCID) 10 MG tablet Take 40 mg by mouth daily as needed for heartburn.      . loratadine (CLARITIN) 10 MG tablet Take 10 mg by mouth daily as needed for allergies.      Marland Kitchen ondansetron (ZOFRAN) 4 MG tablet Take 4 mg by mouth daily as needed. For nausea      . Pediatric Multiple Vit-C-FA (FLINSTONES GUMMIES OMEGA-3 DHA PO) Take 2 each by mouth daily.      . [DISCONTINUED] famotidine (PEPCID) 10 MG tablet Take 4 tablets (40 mg total) by mouth 2 (two) times daily as needed for heartburn.  20 tablet  0    ROS: Pertinent findings in history of present illness.  Physical Exam  Blood pressure 131/68, pulse 87, resp. rate 16, height 5\' 6"  (1.676 m), weight 99.973 kg (220 lb 6.4 oz), last menstrual period 08/15/2012, SpO2 99.00%. GENERAL: Well-developed, well-nourished female in no acute distress.  HEENT: normocephalic, atraumatic Ears: TM's grey normal cone of light, scars along the posterior margin from tympanostomy tubes Nose: nasal mucosa is bluish and boggy, no purulent discharge Throat: tonsils non-erythematous, no exudates or soft palate petechiae Throat: 0.5 cm circumferential ulcerations with white boarder surrounding lesion on the posterior aspect of tongue    HEART: RRR, no m/g/r RESP: normal effort, CLAB, no w/r/r/ ABDOMEN: Soft, non-tender, gravid appropriate for gestational age, normoactive bowel sounds throughout EXTREMITIES: Nontender, no edema NEURO: alert and oriented SPECULUM EXAM: Deferred     FHT:  Baseline 140 ,  moderate variability, accelerations present (15x15), no decelerations Contractions: None  Results for orders placed during the hospital encounter of 02/07/13 (from the past 24 hour(s))  URINALYSIS, ROUTINE W REFLEX MICROSCOPIC     Status: Abnormal   Collection Time    02/07/13  3:30 PM      Result Value Range   Color, Urine YELLOW  YELLOW   APPearance CLOUDY (*) CLEAR   Specific Gravity, Urine 1.015  1.005 - 1.030   pH 7.5  5.0 - 8.0   Glucose, UA NEGATIVE  NEGATIVE mg/dL   Hgb urine dipstick NEGATIVE  NEGATIVE   Bilirubin Urine NEGATIVE  NEGATIVE   Ketones, ur NEGATIVE  NEGATIVE mg/dL   Protein, ur NEGATIVE  NEGATIVE mg/dL   Urobilinogen, UA 0.2  0.0 - 1.0 mg/dL   Nitrite NEGATIVE  NEGATIVE   Leukocytes, UA NEGATIVE  NEGATIVE  CBC     Status: Abnormal   Collection Time    02/07/13  8:00 PM      Result Value Range   WBC 9.6  4.0 - 10.5 K/uL   RBC 3.72 (*) 3.87 - 5.11 MIL/uL   Hemoglobin 10.8 (*) 12.0 - 15.0 g/dL   HCT 16.1 (*) 09.6 - 04.5 %   MCV 87.4  78.0 - 100.0 fL   MCH 29.0  26.0 - 34.0 pg   MCHC 33.2  30.0 - 36.0 g/dL   RDW 40.9  81.1 - 91.4 %   Platelets 237  150 - 400 K/uL    A/P: Linda [name] is a 22 y.o. G1P0 at 34w2dwho presents to maternity admissions reporting upper respiratory infection x2 in last month, treated with abx by her primary care provider.   # Asthma exacerbation: Pt mentions hx increased work of breathing and shortness of breath.  Pt did not try albuterol inhaler for relief.  Differential: Anxiety attack: Pt's breathing reported improved without intervention.  PE: Pt reports hx of bilateral lower extremity edema, however denies pain with deep inspiration and was able to maintain 100% O2 on room air.   -- lungs clear to auscultation -- cn't albuterol prn  -- f/u w/ pcp for pulmonary function testing, consider inhaled corticosteroid as needed  # Oral candidiasis: Pt mentions extensive antibiotic hx 2/2 upper respiratory infection.  White lesion  possible for candidial infection vs herpes orals.  Differential: Vitamin deficiency: consider niacin deficiency for changes to tongue.  Cron's disease: pt has hx of ibs, consider possible cron's disease with oral involvement.   -- oral diflucan -- viscous lidocaine -- f/u with pcp  # Elevated blood pressures per pt: Pt mentions hx of increasing blood pressures and swelling of lower extremities.  Pt concerned for pre-eclampsia due to swelling  and increasing blood pressures.  Pt has had normal blood pressures in MAU and a negative protein on urinalysis.   --continue routine prenatal care --reassurance provided about normal pregnancy, reasons for isolated increase in blood pressure, normal blood pressures today  Gregor Hams, MD Family Practice Resident   I have seen this patient and agree with the above resident's note.  [place], [place] Certified Nurse-Midwife

## 2013-02-18 ENCOUNTER — Inpatient Hospital Stay (HOSPITAL_COMMUNITY)
Admission: AD | Admit: 2013-02-18 | Discharge: 2013-02-18 | Disposition: A | Discharge status: Home / Self Care | Payer: 59 | Source: Ambulatory Visit | Attending: Obstetrics and Gynecology | Admitting: Obstetrics and Gynecology

## 2013-02-18 ENCOUNTER — Encounter (HOSPITAL_COMMUNITY): Payer: Self-pay

## 2013-02-18 DIAGNOSIS — O368190 Decreased fetal movements, unspecified trimester, not applicable or unspecified: Secondary | ICD-10-CM

## 2013-02-18 DIAGNOSIS — O368120 Decreased fetal movements, second trimester, not applicable or unspecified: Secondary | ICD-10-CM

## 2013-02-18 DIAGNOSIS — Z3689 Encounter for other specified antenatal screening: Secondary | ICD-10-CM

## 2013-02-18 DIAGNOSIS — R109 Unspecified abdominal pain: Secondary | ICD-10-CM | POA: Insufficient documentation

## 2013-02-18 HISTORY — DX: Anxiety disorder, unspecified: F41.9

## 2013-02-18 LAB — URINALYSIS, ROUTINE W REFLEX MICROSCOPIC
Nitrite: NEGATIVE
Specific Gravity, Urine: <1.005 — ABNORMAL LOW (ref 1.005–1.030)
pH: 7 (ref 5.0–8.0)

## 2013-02-18 LAB — URINE MICROSCOPIC-ADD ON

## 2013-02-18 NOTE — MAU Note (Addendum)
Pt states that baby has not been moving as much since yesterday. States she has some abdominal cramping that started yesterday as well, but denies vaginal bleeding.

## 2013-02-18 NOTE — MAU Provider Note (Signed)
Chief Complaint:  Decreased Fetal Movement   First Provider Initiated Contact with Patient 02/18/13 2109      HPI: Linda [name] is a 22 y.o. G1P0 at 77w6dwho presents to maternity admissions reporting decreased fetal movement all day today.  She has also had some intermittent cramping no more than 1-2 times per hour, which has not been painful.  She denies LOF, vaginal bleeding, vaginal itching/burning, urinary symptoms, h/a, dizziness, n/v, or fever/chills.     Past Medical History: Past Medical History  Diagnosis Date  . Asthma   . IBS (irritable bowel syndrome)   . Heart murmur   . Interstitial cystitis   . Anxiety     Past obstetric history: OB History   Grav Para Term Preterm Abortions TAB SAB Ect Mult Living   1              # Outc Date GA Lbr Len/2nd Wgt Sex Del Anes PTL Lv   1 CUR               Past Surgical History: Past Surgical History  Procedure Laterality Date  . Upper gi endoscopy    . Tympanostomy tube placement    . No past surgeries      Family History: Family History  Problem Relation Age of Onset  . Cancer Paternal Uncle   . COPD Maternal Grandfather   . Stroke Paternal Grandfather     Social History: History  Substance Use Topics  . Smoking status: Former Smoker -- 0.50 packs/day    Types: Cigarettes  . Smokeless tobacco: Never Used  . Alcohol Use: No    Allergies:  Allergies  Allergen Reactions  . Sulfa Antibiotics Diarrhea and Nausea Only    Meds:  Prescriptions prior to admission  Medication Sig Dispense Refill  . acetaminophen (TYLENOL) 500 MG tablet Take 500 mg by mouth daily as needed for pain. For pain/headache      . albuterol (PROVENTIL HFA;VENTOLIN HFA) 108 (90 BASE) MCG/ACT inhaler Inhale 2 puffs into the lungs 2 (two) times daily as needed. For shortness of breath/asthma      . famotidine (PEPCID) 10 MG tablet Take 40 mg by mouth daily as needed for heartburn.      . loratadine (CLARITIN) 10 MG tablet Take 10 mg by  mouth daily as needed for allergies.      Marland Kitchen ondansetron (ZOFRAN) 4 MG tablet Take 4 mg by mouth daily as needed. For nausea      . Pediatric Multiple Vit-C-FA (FLINSTONES GUMMIES OMEGA-3 DHA PO) Take 2 each by mouth daily.        ROS: Pertinent findings in history of present illness.  Physical Exam  Blood pressure 129/65, pulse 94, temperature 97 F (36.1 C), temperature source Oral, resp. rate 18, height 5\' 5"  (1.651 m), weight 100.699 kg (222 lb), last menstrual period 08/15/2012, SpO2 100.00%. GENERAL: Well-developed, well-nourished female in no acute distress.  HEENT: normocephalic HEART: normal rate RESP: normal effort ABDOMEN: Soft, non-tender, gravid appropriate for gestational age EXTREMITIES: Nontender, no edema NEURO: alert and oriented SPECULUM EXAM: Deferred     FHT:  Baseline 150 , moderate variability, accelerations present (15x15), no decelerations Contractions: None on toco or by palpation   Labs: Results for orders placed during the hospital encounter of 02/18/13 (from the past 24 hour(s))  URINALYSIS, ROUTINE W REFLEX MICROSCOPIC     Status: Abnormal   Collection Time    02/18/13  7:15 PM  Result Value Range   Color, Urine YELLOW  YELLOW   APPearance CLEAR  CLEAR   Specific Gravity, Urine <1.005 (*) 1.005 - 1.030   pH 7.0  5.0 - 8.0   Glucose, UA NEGATIVE  NEGATIVE mg/dL   Hgb urine dipstick TRACE (*) NEGATIVE   Bilirubin Urine NEGATIVE  NEGATIVE   Ketones, ur NEGATIVE  NEGATIVE mg/dL   Protein, ur NEGATIVE  NEGATIVE mg/dL   Urobilinogen, UA 0.2  0.0 - 1.0 mg/dL   Nitrite NEGATIVE  NEGATIVE   Leukocytes, UA TRACE (*) NEGATIVE  URINE MICROSCOPIC-ADD ON     Status: Abnormal   Collection Time    02/18/13  7:15 PM      Result Value Range   Squamous Epithelial / LPF RARE  RARE   WBC, UA 3-6  <3 WBC/hpf   RBC / HPF 3-6  <3 RBC/hpf   Bacteria, UA FEW (*) RARE    Assessment: 1. Decreased fetal movement in pregnancy, second trimester, not applicable or  unspecified fetus   2. NST (non-stress test) reactive     Plan: Called Dr Jackelyn Knife to review assessment and findings Discharge home Labor precautions and fetal kick counts (discussed [facility] normally start at 28 weeks) Drink plenty of fluids F/U as scheduled in office Return to MAU as needed    Medication List    ASK your doctor about these medications       acetaminophen 500 MG tablet  Commonly known as:  TYLENOL  Take 500 mg by mouth daily as needed for pain. For pain/headache     albuterol 108 (90 BASE) MCG/ACT inhaler  Commonly known as:  PROVENTIL HFA;VENTOLIN HFA  Inhale 2 puffs into the lungs 2 (two) times daily as needed. For shortness of breath/asthma     CLARITIN 10 MG tablet  Generic drug:  loratadine  Take 10 mg by mouth daily as needed for allergies.     famotidine 10 MG tablet  Commonly known as:  PEPCID  Take 40 mg by mouth daily as needed for heartburn.     FLINSTONES GUMMIES OMEGA-3 DHA PO  Take 2 each by mouth daily.     ondansetron 4 MG tablet  Commonly known as:  ZOFRAN  Take 4 mg by mouth daily as needed. For nausea        Sharen Counter Certified Nurse-Midwife 02/18/2013 9:22 PM

## 2013-02-20 LAB — URINE CULTURE

## 2013-02-22 NOTE — Progress Notes (Signed)
FHT from 4-6 reviewed.  Reactive NST, no significant decels or ctx.

## 2013-02-27 LAB — OB RESULTS CONSOLE HEPATITIS B SURFACE ANTIGEN: Hepatitis B Surface Ag: NEGATIVE

## 2013-03-18 ENCOUNTER — Inpatient Hospital Stay (HOSPITAL_COMMUNITY)
Admission: AD | Admit: 2013-03-18 | Discharge: 2013-03-23 | DRG: 765 | Disposition: A | Discharge status: Home / Self Care | Payer: 59 | Source: Ambulatory Visit | Attending: Obstetrics and Gynecology | Admitting: Obstetrics and Gynecology

## 2013-03-18 ENCOUNTER — Inpatient Hospital Stay (HOSPITAL_COMMUNITY): Payer: 59

## 2013-03-18 ENCOUNTER — Encounter (HOSPITAL_COMMUNITY): Payer: Self-pay | Admitting: Obstetrics and Gynecology

## 2013-03-18 DIAGNOSIS — O4590 Premature separation of placenta, unspecified, unspecified trimester: Principal | ICD-10-CM | POA: Diagnosis present

## 2013-03-18 DIAGNOSIS — O368190 Decreased fetal movements, unspecified trimester, not applicable or unspecified: Secondary | ICD-10-CM | POA: Diagnosis present

## 2013-03-18 DIAGNOSIS — O4100X0 Oligohydramnios, unspecified trimester, not applicable or unspecified: Secondary | ICD-10-CM | POA: Diagnosis present

## 2013-03-18 DIAGNOSIS — Z98891 History of uterine scar from previous surgery: Secondary | ICD-10-CM

## 2013-03-18 DIAGNOSIS — R209 Unspecified disturbances of skin sensation: Secondary | ICD-10-CM | POA: Diagnosis not present

## 2013-03-18 LAB — COMPREHENSIVE METABOLIC PANEL
ALT: 11 U/L (ref 0–35)
AST: 14 U/L (ref 0–37)
Alkaline Phosphatase: 211 U/L — ABNORMAL HIGH (ref 39–117)
CO2: 22 mEq/L (ref 19–32)
Calcium: 8.9 mg/dL (ref 8.4–10.5)
GFR calc Af Amer: >90 mL/min (ref >90)
GFR calc non Af Amer: >90 mL/min (ref >90)
Glucose, Bld: 120 mg/dL — ABNORMAL HIGH (ref 70–99)
Potassium: 2.7 mEq/L — CL (ref 3.5–5.1)
Sodium: 138 mEq/L (ref 135–145)

## 2013-03-18 LAB — CBC
HCT: 31.7 % — ABNORMAL LOW (ref 36.0–46.0)
Hemoglobin: 10.5 g/dL — ABNORMAL LOW (ref 12.0–15.0)
WBC: 13.3 10*3/uL — ABNORMAL HIGH (ref 4.0–10.5)

## 2013-03-18 LAB — URINALYSIS, ROUTINE W REFLEX MICROSCOPIC
Bilirubin Urine: NEGATIVE
Hgb urine dipstick: NEGATIVE
Ketones, ur: >80 mg/dL — AB
Leukocytes, UA: NEGATIVE
Specific Gravity, Urine: 1.02 (ref 1.005–1.030)
Urobilinogen, UA: 0.2 mg/dL (ref 0.0–1.0)
pH: 7.5 (ref 5.0–8.0)

## 2013-03-18 MED ORDER — LACTATED RINGERS IV SOLN
INTRAVENOUS | Status: DC
  Administered 2013-03-19: 01:00:00 via INTRAVENOUS

## 2013-03-18 MED ORDER — NIFEDIPINE 10 MG PO CAPS
10.0000 mg | ORAL_CAPSULE | Freq: Once | ORAL | Status: AC
  Administered 2013-03-18: 10 mg via ORAL
  Filled 2013-03-18: qty 1

## 2013-03-18 MED ORDER — BUTORPHANOL TARTRATE 1 MG/ML IJ SOLN
1.0000 mg | INTRAMUSCULAR | Status: DC | PRN
  Administered 2013-03-18: 1 mg via INTRAVENOUS
  Filled 2013-03-18: qty 1

## 2013-03-18 MED ORDER — POTASSIUM CHLORIDE CRYS ER 20 MEQ PO TBCR
20.0000 meq | EXTENDED_RELEASE_TABLET | Freq: Two times a day (BID) | ORAL | Status: DC
  Administered 2013-03-19 (×3): 20 meq via ORAL
  Filled 2013-03-18 (×6): qty 1

## 2013-03-18 MED ORDER — ACETAMINOPHEN 325 MG PO TABS: 650.0000 mg | ORAL_TABLET | ORAL | Status: DC | PRN

## 2013-03-18 MED ORDER — HYDROMORPHONE HCL PF 1 MG/ML IJ SOLN: 1.0000 mg | INTRAMUSCULAR | Status: DC | PRN

## 2013-03-18 MED ORDER — CALCIUM CARBONATE ANTACID 500 MG PO CHEW: 2.0000 | CHEWABLE_TABLET | ORAL | Status: DC | PRN

## 2013-03-18 MED ORDER — PRENATAL MULTIVITAMIN CH
1.0000 | ORAL_TABLET | Freq: Every day | ORAL | Status: DC
  Filled 2013-03-18: qty 1

## 2013-03-18 MED ORDER — LORAZEPAM 2 MG/ML IJ SOLN
0.5000 mg | Freq: Once | INTRAMUSCULAR | Status: AC
  Administered 2013-03-19: 0.5 mg via INTRAVENOUS
  Filled 2013-03-18: qty 1

## 2013-03-18 MED ORDER — NIFEDIPINE 10 MG PO CAPS
20.0000 mg | ORAL_CAPSULE | Freq: Once | ORAL | Status: AC
  Administered 2013-03-18: 20 mg via ORAL
  Filled 2013-03-18: qty 2

## 2013-03-18 MED ORDER — BETAMETHASONE SOD PHOS & ACET 6 (3-3) MG/ML IJ SUSP
12.0000 mg | INTRAMUSCULAR | Status: AC
  Administered 2013-03-18 – 2013-03-19 (×2): 12 mg via INTRAMUSCULAR
  Filled 2013-03-18 (×2): qty 2

## 2013-03-18 MED ORDER — DOCUSATE SODIUM 100 MG PO CAPS
100.0000 mg | ORAL_CAPSULE | Freq: Every day | ORAL | Status: DC
  Filled 2013-03-18: qty 1

## 2013-03-18 MED ORDER — ZOLPIDEM TARTRATE 5 MG PO TABS
5.0000 mg | ORAL_TABLET | Freq: Every evening | ORAL | Status: DC | PRN
  Administered 2013-03-19: 5 mg via ORAL
  Filled 2013-03-18: qty 1

## 2013-03-18 NOTE — MAU Note (Signed)
Pt attempted to get out of bed to get dressed to go home. Pain increased and she felt a gush of blood. Dr. Jackelyn Knife notified and plans to admit to [facility] for observation. Dr. Jackelyn Knife will enter the orders.

## 2013-03-18 NOTE — Progress Notes (Signed)
Pt refuses terbutaline. Pt told that if she continues to contract, she will continue.  to bleed and may go into labor . Pt says that she feels anxious and nauseus already.

## 2013-03-18 NOTE — MAU Provider Note (Signed)
  History     CSN: [id number]  Arrival date and time: 03/18/13 1234   None     Chief Complaint  Patient presents with  . Vaginal Bleeding   HPI  Linda [name] is a 22 y.o. G1P0 at [redacted]w[redacted]d who presents today with vaginal bleeding and pain. She states that around 0500 today she started to have constant pain, and then at about 1200 she noticed heavy bright red bleeding. The pain has gone from being constant to being intermittent, but is an 8/10. She denies any complications with the pregnancy other than "some fluctuating blood pressures".   Past Medical History  Diagnosis Date  . Asthma   . IBS (irritable bowel syndrome)   . Heart murmur   . Interstitial cystitis   . Anxiety     Past Surgical History  Procedure Laterality Date  . Upper gi endoscopy    . Tympanostomy tube placement    . No past surgeries      Family History  Problem Relation Age of Onset  . Cancer Paternal Uncle   . COPD Maternal Grandfather   . Stroke Paternal Grandfather     History  Substance Use Topics  . Smoking status: Former Smoker -- 0.50 packs/day    Types: Cigarettes  . Smokeless tobacco: Never Used  . Alcohol Use: No    Allergies:  Allergies  Allergen Reactions  . Sulfa Antibiotics Diarrhea and Nausea Only    Prescriptions prior to admission  Medication Sig Dispense Refill  . acetaminophen (TYLENOL) 500 MG tablet Take 500 mg by mouth daily as needed for pain. For pain/headache      . albuterol (PROVENTIL HFA;VENTOLIN HFA) 108 (90 BASE) MCG/ACT inhaler Inhale 2 puffs into the lungs 2 (two) times daily as needed. For shortness of breath/asthma      . famotidine (PEPCID) 10 MG tablet Take 40 mg by mouth daily as needed for heartburn.      . loratadine (CLARITIN) 10 MG tablet Take 10 mg by mouth daily as needed for allergies.      Marland Kitchen ondansetron (ZOFRAN) 4 MG tablet Take 4 mg by mouth daily as needed. For nausea      . Pediatric Multiple Vit-C-FA (FLINSTONES GUMMIES OMEGA-3 DHA PO) Take  2 each by mouth daily.        ROS Physical Exam   Last menstrual period 08/15/2012.  Physical Exam  Nursing note and vitals reviewed. Constitutional: She is oriented to person, place, and time. She appears well-developed and well-nourished. No distress.  Cardiovascular: Normal rate.   Respiratory: Effort normal.  GI: Soft.  Genitourinary:   External: no lesion, bloody Vagina: moderate amount of BRB in the vagina Cervix: appears normal, difficult to assess 2/2 to oozing of blood. Overall healthy and blood is not coming from cervix. Appears closed, but again, difficult to assess. Will defer digital exam pending ultrasound.  Uterus: AGA, non-tender, ctx q2  Neurological: She is alert and oriented to person, place, and time.  Skin: Skin is warm and dry.  Psychiatric: She has a normal mood and affect.    MAU Course  Procedures  1259: Dr. Jackelyn Knife notified, will come to the unit to evaluate the patient.   Assessment and Plan  Vaginal bleeding in the 3rd trimester Contractions Reassruing FHTs at this time.   Care assumed by Dr. Jackelyn Knife.   [name] 03/18/2013, 1:00 PM

## 2013-03-18 NOTE — Progress Notes (Signed)
Moderate amt of bright red bleeding noted on peri pad.

## 2013-03-18 NOTE — Progress Notes (Signed)
See initial eval note by CNM.  30 W 6D with vaginal bleeding FHT reassuring, probable ctx q 3 min VE- closed/30/-3, minimal blood on the glove U/s by tech, no obvious abruption, no previa, possible clot in amniotic cavity  Will try Procardia for ctx and monitor.  If ctx and bleeding improve may be able to send home.  If ctx or bleeding persist will admit for observation.

## 2013-03-18 NOTE — Progress Notes (Signed)
Dr. Jackelyn Knife notified that pt is refusing both terbutaline and procardia. Orders received for 1/2mg  iv ativan. Maybe pt will reconsider terbutaline if she feels less anxious.

## 2013-03-18 NOTE — Progress Notes (Signed)
Dr. Jackelyn Knife notified of pt's vaginal bleeding, uc's, and c/o sl vag pressure with uc's. Orders received for either procardia 20mg  po or terbutaline .25mg  sq. Discussed options with pt and family. Pt refuses procardia. States she does not like how it made her feel. States she will think about taking the sq injection of terbutaline and will let us know what she decides.

## 2013-03-18 NOTE — MAU Note (Signed)
Linda [name] is here with complaints of vaginal bleeding; she is [redacted]w[redacted]d, G1. Pt first noticed the bleeding at 1130

## 2013-03-18 NOTE — MAU Note (Signed)
Dr.Meisinger called and notified of patients Fetal strip and pain. Pt is not feeling any contractions and no contractions have been picked up on the monitor. Pt complains of right sided abdominal pain that comes and goes. Bleeding is small amount; pad assessed, quarter size of frank blood on pad. Ok to discharge patient home with bleeding precautions.

## 2013-03-19 LAB — BASIC METABOLIC PANEL
CO2: 20 mEq/L (ref 19–32)
Calcium: 8.9 mg/dL (ref 8.4–10.5)
GFR calc Af Amer: >90 mL/min (ref >90)
GFR calc non Af Amer: >90 mL/min (ref >90)
Sodium: 138 mEq/L (ref 135–145)

## 2013-03-19 LAB — CBC
HCT: 27.8 % — ABNORMAL LOW (ref 36.0–46.0)
Hemoglobin: 8.9 g/dL — ABNORMAL LOW (ref 12.0–15.0)
MCH: 28.2 pg (ref 26.0–34.0)
MCHC: 32 g/dL (ref 30.0–36.0)
MCV: 88 fL (ref 78.0–100.0)
RBC: 3.16 MIL/uL — ABNORMAL LOW (ref 3.87–5.11)

## 2013-03-19 MED ORDER — ONDANSETRON HCL 4 MG PO TABS
8.0000 mg | ORAL_TABLET | Freq: Once | ORAL | Status: DC
  Filled 2013-03-19: qty 2

## 2013-03-19 MED ORDER — LACTATED RINGERS IV SOLN
Freq: Once | INTRAVENOUS | Status: AC
  Administered 2013-03-19: 09:00:00 via INTRAVENOUS

## 2013-03-19 MED ORDER — POTASSIUM CHLORIDE 10 MEQ/100ML IV SOLN
10.0000 meq | INTRAVENOUS | Status: AC
  Administered 2013-03-19 (×2): 10 meq via INTRAVENOUS
  Filled 2013-03-19 (×3): qty 100

## 2013-03-19 NOTE — Progress Notes (Signed)
Patient ID: Linda [name], female   DOB: 1991-05-04, 22 y.o.   MRN: [id number]  Pt with continued pain.  Long discussion with pt and family re abruption, fetal well-being, and decreased K+ - inclusing mgmt, Also d/w family and pt r/b/a of LTCS as likely route of delivery.  D/w pt patients as mother AND child.    AFVSS gen NAD  FHTs 150's, mod var; some variables toco none  Abd soft, some guarding fundally, no rebound  22yo at 31wk with likely abruption  NICU consult Oral K+ - CMP in AM Cont continuous monitoring (EFM and toco)  Cont type and screen [place] in AM with MFM to eval baby and placenta

## 2013-03-19 NOTE — Progress Notes (Signed)
Pt c/o numbness and tingling in hands and feet. Pt also c/o not being able to move her fingers and thumbs. Both thumbs are noted to be stiff. Discussed signs/symptoms of Hypokalemia with pt and family members.

## 2013-03-19 NOTE — Progress Notes (Signed)
Ur chart review completed.  

## 2013-03-19 NOTE — Progress Notes (Signed)
RN called to room after pt up void.  Pt changing her pad and covered one peripad and off the sides on the blue pad.  No clots noted.

## 2013-03-19 NOTE — Progress Notes (Signed)
MD notified of VB episode.  No orders at this time.  RN will cont to monitor

## 2013-03-19 NOTE — Progress Notes (Signed)
Tracing maternal HR

## 2013-03-19 NOTE — Progress Notes (Signed)
Pt rolling all over the bed c/o pain in her IV site, hand and arm.  Fetal Heart tracing intermittently tracing.  Ice pack on arm not helping.  Offered to pt that we could change her IV site.  Pt agrees to change IV site.

## 2013-03-19 NOTE — H&P (Signed)
History    CSN: [id number]  Arrival date and time: 03/18/13 1234  None  Chief Complaint   Patient presents with   .  Vaginal Bleeding    HPI  KNIYAH KHUN is a 22 y.o. G1P0 at [redacted]w[redacted]d who presents today with vaginal bleeding and pain. She states that around 0500 today she started to have constant pain, and then at about 1200 she noticed heavy bright red bleeding. The pain has gone from being constant to being intermittent, but is an 8/10. She denies any complications with the pregnancy other than "some fluctuating blood pressures".  Past Medical History   Diagnosis  Date   .  Asthma    .  IBS (irritable bowel syndrome)    .  Heart murmur    .  Interstitial cystitis    .  Anxiety     Past Surgical History   Procedure  Laterality  Date   .  Upper gi endoscopy     .  Tympanostomy tube placement     .  No past surgeries      Family History   Problem  Relation  Age of Onset   .  Cancer  Paternal Uncle    .  COPD  Maternal Grandfather    .  Stroke  Paternal Grandfather     History   Substance Use Topics   .  Smoking status:  Former Smoker -- 0.50 packs/day     Types:  Cigarettes   .  Smokeless tobacco:  Never Used   .  Alcohol Use:  No    Allergies:  Allergies   Allergen  Reactions   .  Sulfa Antibiotics  Diarrhea and Nausea Only    Prescriptions prior to admission   Medication  Sig  Dispense  Refill   .  acetaminophen (TYLENOL) 500 MG tablet  Take 500 mg by mouth daily as needed for pain. For pain/headache     .  albuterol (PROVENTIL HFA;VENTOLIN HFA) 108 (90 BASE) MCG/ACT inhaler  Inhale 2 puffs into the lungs 2 (two) times daily as needed. For shortness of breath/asthma     .  famotidine (PEPCID) 10 MG tablet  Take 40 mg by mouth daily as needed for heartburn.     .  loratadine (CLARITIN) 10 MG tablet  Take 10 mg by mouth daily as needed for allergies.     Marland Kitchen  ondansetron (ZOFRAN) 4 MG tablet  Take 4 mg by mouth daily as needed. For nausea     .  Pediatric Multiple  Vit-C-FA (FLINSTONES GUMMIES OMEGA-3 DHA PO)  Take 2 each by mouth daily.      ROS  Physical Exam   Last menstrual period 08/15/2012.  Physical Exam  Nursing note and vitals reviewed.  Constitutional: She is oriented to person, place, and time. She appears well-developed and well-nourished. No distress.  Cardiovascular: Normal rate.  Respiratory: Effort normal.  GI: Soft.  Genitourinary:   External: no lesion, bloody Vagina: moderate amount of BRB in the vagina Cervix: appears normal, difficult to assess 2/2 to oozing of blood. Overall healthy and blood is not coming from cervix. Appears closed, but again, difficult to assess. Will defer digital exam pending ultrasound.  Uterus: AGA, non-tender, ctx q2  Neurological: She is alert and oriented to person, place, and time.  Skin: Skin is warm and dry.  Psychiatric: She has a normal mood and affect.   MAU Course   Procedures  1259: Dr. Jackelyn Knife notified, will  come to the unit to evaluate the patient.  Assessment and Plan   Vaginal bleeding in the 3rd trimester  Contractions  Reassruing FHTs at this time.  Care assumed by Dr. Jackelyn Knife.   31W 0D today, admitted due to increasing pain and steady light bleeding last night.  Pt declined any tocolytics.  On CMP, found to be hypokalemic, labs o/w normal.  PO K+ ordered. This am, bleeding is scant, ctx and pain are improved.  C/o cramps in hands and feet. Afeb, VSS FHT- Cat I, no ctx tracing HGB stable Will saline lock IV, continue PO K+ and try some IV K+ since having symptoms, advance to regular diet, recheck labs at noon.  Received first betamethasone yesterday pm, will get second dose later today.  Will evaluate later today for continued observation vs. Discharge.

## 2013-03-19 NOTE — Progress Notes (Signed)
MD informed of FHR decels

## 2013-03-19 NOTE — Progress Notes (Signed)
Back in the room with the pt and pot now refusing the Zofran at this time. Saying that she is feeling better but still a little dizzy.  Pt also stating that she didn't get much sleep last night and that she was exhausted.  RN encouraging rest, pt stating that she cannot nap with the K+ infusing.  Pt encouraged that after her next 2 doses were completed maybe she could nap.  Pt seems frustrated but tolerating at this time.

## 2013-03-19 NOTE — Consult Note (Signed)
Asked by Dr. Ellyn Hack to provide prenatal consultation for patient at risk for preterm delivery due to vaginal bleeding/presumed abruption.  Mother is 21y.o. Linda [name] is now 31.[redacted] weeks EGA, with pregnancy complicated by bleeding of bright red blood and pain.  She is being treated with betamethasone (2nd dose about 1730 today).  Discussed usual expectations for preterm infant at [redacted] weeks gestation with patient and her mother, including possible needs for DR resuscitation, respiratory support, IV access, and antibiotics.  Presented optimistic prognosis for survival but with possible complications and possible length of stay in NICU until 36 - [redacted] wks EGA.  Discussed advantages of feeding with mother's milk, she plans to pump postnatally.  Patient was attentive and had appropriate questions, she was appreciative of my input.  Thank you for the consultation.  Face-to-face time 20 minutes Total time 30 minutes

## 2013-03-19 NOTE — Progress Notes (Signed)
Md called and informed that pt c/o dizziness and nausea. VSS.  Pt was asking if the K+ could make her feel like that. Pt informed that it was unlikely the K+, pt was nauseated this morning before the K+ IV started, unable to eat a lot.  Orders received form MD for Zofran.

## 2013-03-20 ENCOUNTER — Encounter (HOSPITAL_COMMUNITY): Payer: Self-pay | Admitting: Anesthesiology

## 2013-03-20 ENCOUNTER — Observation Stay (HOSPITAL_COMMUNITY): Payer: 59 | Admitting: Anesthesiology

## 2013-03-20 ENCOUNTER — Encounter (HOSPITAL_COMMUNITY)
Admission: AD | Disposition: A | Discharge status: Home / Self Care | Payer: Self-pay | Source: Ambulatory Visit | Attending: Obstetrics and Gynecology

## 2013-03-20 ENCOUNTER — Inpatient Hospital Stay (HOSPITAL_COMMUNITY): Payer: 59

## 2013-03-20 LAB — COMPREHENSIVE METABOLIC PANEL
ALT: 8 U/L (ref 0–35)
AST: 11 U/L (ref 0–37)
Albumin: 2.4 g/dL — ABNORMAL LOW (ref 3.5–5.2)
Alkaline Phosphatase: 181 U/L — ABNORMAL HIGH (ref 39–117)
Glucose, Bld: 117 mg/dL — ABNORMAL HIGH (ref 70–99)
Potassium: 3 mEq/L — ABNORMAL LOW (ref 3.5–5.1)
Sodium: 139 mEq/L (ref 135–145)
Total Protein: 5.9 g/dL — ABNORMAL LOW (ref 6.0–8.3)

## 2013-03-20 LAB — CBC
HCT: 27.4 % — ABNORMAL LOW (ref 36.0–46.0)
MCHC: 31.8 g/dL (ref 30.0–36.0)
MCV: 89.5 fL (ref 78.0–100.0)
RDW: 14.1 % (ref 11.5–15.5)

## 2013-03-20 SURGERY — Surgical Case
Anesthesia: Spinal | Site: Abdomen | Wound class: Clean Contaminated

## 2013-03-20 MED ORDER — 0.9 % SODIUM CHLORIDE (POUR BTL) OPTIME
TOPICAL | Status: DC | PRN
  Administered 2013-03-20: 1000 mL

## 2013-03-20 MED ORDER — WITCH HAZEL-GLYCERIN EX PADS: 1.0000 "application " | MEDICATED_PAD | CUTANEOUS | Status: DC | PRN

## 2013-03-20 MED ORDER — DIPHENHYDRAMINE HCL 50 MG/ML IJ SOLN: 12.5000 mg | INTRAMUSCULAR | Status: DC | PRN

## 2013-03-20 MED ORDER — NALBUPHINE HCL 10 MG/ML IJ SOLN
5.0000 mg | INTRAMUSCULAR | Status: DC | PRN
  Filled 2013-03-20: qty 1

## 2013-03-20 MED ORDER — PHENYLEPHRINE HCL 10 MG/ML IJ SOLN
INTRAMUSCULAR | Status: DC | PRN
  Administered 2013-03-20 (×2): 80 ug via INTRAVENOUS
  Administered 2013-03-20: 40 ug via INTRAVENOUS

## 2013-03-20 MED ORDER — MEPERIDINE HCL 25 MG/ML IJ SOLN: 6.2500 mg | INTRAMUSCULAR | Status: DC | PRN

## 2013-03-20 MED ORDER — LACTATED RINGERS IV SOLN
INTRAVENOUS | Status: DC
  Administered 2013-03-20 (×3): via INTRAVENOUS

## 2013-03-20 MED ORDER — SODIUM CHLORIDE 0.9 % IJ SOLN: 3.0000 mL | INTRAMUSCULAR | Status: DC | PRN

## 2013-03-20 MED ORDER — SCOPOLAMINE 1 MG/3DAYS TD PT72
MEDICATED_PATCH | TRANSDERMAL | Status: AC
  Filled 2013-03-20: qty 1

## 2013-03-20 MED ORDER — ZOLPIDEM TARTRATE 5 MG PO TABS: 5.0000 mg | ORAL_TABLET | Freq: Every evening | ORAL | Status: DC | PRN

## 2013-03-20 MED ORDER — MORPHINE SULFATE 0.5 MG/ML IJ SOLN
INTRAMUSCULAR | Status: AC
  Filled 2013-03-20: qty 10

## 2013-03-20 MED ORDER — HYDROMORPHONE HCL PF 1 MG/ML IJ SOLN
INTRAMUSCULAR | Status: AC
  Filled 2013-03-20: qty 1

## 2013-03-20 MED ORDER — PHENYLEPHRINE HCL 10 MG/ML IJ SOLN
INTRAMUSCULAR | Status: DC | PRN
  Administered 2013-03-20: 80 ug via INTRAVENOUS

## 2013-03-20 MED ORDER — NALBUPHINE HCL 10 MG/ML IJ SOLN
5.0000 mg | INTRAMUSCULAR | Status: DC | PRN
  Administered 2013-03-20: 5 mg via SUBCUTANEOUS
  Filled 2013-03-20: qty 1

## 2013-03-20 MED ORDER — SIMETHICONE 80 MG PO CHEW: 80.0000 mg | CHEWABLE_TABLET | ORAL | Status: DC | PRN

## 2013-03-20 MED ORDER — KETOROLAC TROMETHAMINE 60 MG/2ML IM SOLN: 60.0000 mg | Freq: Once | INTRAMUSCULAR | Status: AC | PRN

## 2013-03-20 MED ORDER — DIBUCAINE 1 % RE OINT: 1.0000 "application " | TOPICAL_OINTMENT | RECTAL | Status: DC | PRN

## 2013-03-20 MED ORDER — LANOLIN HYDROUS EX OINT: 1.0000 "application " | TOPICAL_OINTMENT | CUTANEOUS | Status: DC | PRN

## 2013-03-20 MED ORDER — DIPHENHYDRAMINE HCL 25 MG PO CAPS: 25.0000 mg | ORAL_CAPSULE | Freq: Four times a day (QID) | ORAL | Status: DC | PRN

## 2013-03-20 MED ORDER — CITRIC ACID-SODIUM CITRATE 334-500 MG/5ML PO SOLN
ORAL | Status: AC
  Administered 2013-03-20: 30 mL
  Filled 2013-03-20: qty 15

## 2013-03-20 MED ORDER — HYDROMORPHONE HCL PF 1 MG/ML IJ SOLN: 0.2500 mg | INTRAMUSCULAR | Status: DC | PRN

## 2013-03-20 MED ORDER — OXYTOCIN 10 UNIT/ML IJ SOLN
INTRAMUSCULAR | Status: AC
  Filled 2013-03-20: qty 3

## 2013-03-20 MED ORDER — MENTHOL 3 MG MT LOZG: 1.0000 | LOZENGE | OROMUCOSAL | Status: DC | PRN

## 2013-03-20 MED ORDER — NALOXONE HCL 1 MG/ML IJ SOLN
1.0000 ug/kg/h | INTRAVENOUS | Status: DC | PRN
  Filled 2013-03-20: qty 2

## 2013-03-20 MED ORDER — ONDANSETRON HCL 4 MG/2ML IJ SOLN
INTRAMUSCULAR | Status: DC | PRN
  Administered 2013-03-20: 4 mg via INTRAVENOUS

## 2013-03-20 MED ORDER — ALBUTEROL SULFATE HFA 108 (90 BASE) MCG/ACT IN AERS
2.0000 | INHALATION_SPRAY | Freq: Two times a day (BID) | RESPIRATORY_TRACT | Status: DC | PRN
  Filled 2013-03-20: qty 6.7

## 2013-03-20 MED ORDER — PRENATAL MULTIVITAMIN CH
1.0000 | ORAL_TABLET | Freq: Every day | ORAL | Status: DC
  Administered 2013-03-21 – 2013-03-23 (×3): 1 via ORAL
  Filled 2013-03-20 (×3): qty 1

## 2013-03-20 MED ORDER — OXYTOCIN 40 UNITS IN LACTATED RINGERS INFUSION - SIMPLE MED: 62.5000 mL/h | INTRAVENOUS | Status: AC

## 2013-03-20 MED ORDER — KETOROLAC TROMETHAMINE 30 MG/ML IJ SOLN: 30.0000 mg | Freq: Four times a day (QID) | INTRAMUSCULAR | Status: AC | PRN

## 2013-03-20 MED ORDER — LACTATED RINGERS IV SOLN
INTRAVENOUS | Status: DC
  Administered 2013-03-20 – 2013-03-21 (×3): via INTRAVENOUS

## 2013-03-20 MED ORDER — CEFAZOLIN SODIUM-DEXTROSE 2-3 GM-% IV SOLR
INTRAVENOUS | Status: DC | PRN
  Administered 2013-03-20: 2 g via INTRAVENOUS

## 2013-03-20 MED ORDER — CEFAZOLIN SODIUM-DEXTROSE 2-3 GM-% IV SOLR
INTRAVENOUS | Status: AC
  Filled 2013-03-20: qty 50

## 2013-03-20 MED ORDER — MORPHINE SULFATE (PF) 0.5 MG/ML IJ SOLN
INTRAMUSCULAR | Status: DC | PRN
  Administered 2013-03-20: .2 mg via INTRATHECAL

## 2013-03-20 MED ORDER — KETOROLAC TROMETHAMINE 60 MG/2ML IM SOLN
INTRAMUSCULAR | Status: AC
  Administered 2013-03-20: 60 mg via INTRAMUSCULAR
  Filled 2013-03-20: qty 2

## 2013-03-20 MED ORDER — ONDANSETRON HCL 4 MG/2ML IJ SOLN: 4.0000 mg | Freq: Three times a day (TID) | INTRAMUSCULAR | Status: DC | PRN

## 2013-03-20 MED ORDER — IBUPROFEN 600 MG PO TABS
600.0000 mg | ORAL_TABLET | Freq: Four times a day (QID) | ORAL | Status: DC
  Administered 2013-03-21 – 2013-03-23 (×8): 600 mg via ORAL
  Filled 2013-03-20 (×9): qty 1

## 2013-03-20 MED ORDER — FENTANYL CITRATE 0.05 MG/ML IJ SOLN
INTRAMUSCULAR | Status: DC | PRN
  Administered 2013-03-20: 12.5 ug via INTRATHECAL

## 2013-03-20 MED ORDER — OXYCODONE-ACETAMINOPHEN 5-325 MG PO TABS
1.0000 | ORAL_TABLET | ORAL | Status: DC | PRN
  Filled 2013-03-20: qty 1

## 2013-03-20 MED ORDER — DIPHENHYDRAMINE HCL 25 MG PO CAPS
25.0000 mg | ORAL_CAPSULE | ORAL | Status: DC | PRN
  Administered 2013-03-20: 25 mg via ORAL
  Filled 2013-03-20: qty 1

## 2013-03-20 MED ORDER — DIPHENHYDRAMINE HCL 50 MG/ML IJ SOLN: 25.0000 mg | INTRAMUSCULAR | Status: DC | PRN

## 2013-03-20 MED ORDER — ONDANSETRON HCL 4 MG/2ML IJ SOLN: 4.0000 mg | INTRAMUSCULAR | Status: DC | PRN

## 2013-03-20 MED ORDER — ONDANSETRON HCL 4 MG PO TABS: 4.0000 mg | ORAL_TABLET | ORAL | Status: DC | PRN

## 2013-03-20 MED ORDER — PHENYLEPHRINE 40 MCG/ML (10ML) SYRINGE FOR IV PUSH (FOR BLOOD PRESSURE SUPPORT)
PREFILLED_SYRINGE | INTRAVENOUS | Status: AC
  Filled 2013-03-20: qty 5

## 2013-03-20 MED ORDER — TETANUS-DIPHTH-ACELL PERTUSSIS 5-2.5-18.5 LF-MCG/0.5 IM SUSP
0.5000 mL | Freq: Once | INTRAMUSCULAR | Status: AC
  Administered 2013-03-22: 0.5 mL via INTRAMUSCULAR
  Filled 2013-03-20: qty 0.5

## 2013-03-20 MED ORDER — KETOROLAC TROMETHAMINE 30 MG/ML IJ SOLN: 15.0000 mg | Freq: Once | INTRAMUSCULAR | Status: DC | PRN

## 2013-03-20 MED ORDER — KETOROLAC TROMETHAMINE 30 MG/ML IJ SOLN
30.0000 mg | Freq: Four times a day (QID) | INTRAMUSCULAR | Status: AC | PRN
  Administered 2013-03-20 – 2013-03-21 (×3): 30 mg via INTRAVENOUS
  Filled 2013-03-20 (×3): qty 1

## 2013-03-20 MED ORDER — SCOPOLAMINE 1 MG/3DAYS TD PT72
1.0000 | MEDICATED_PATCH | Freq: Once | TRANSDERMAL | Status: DC
  Administered 2013-03-20: 1.5 mg via TRANSDERMAL

## 2013-03-20 MED ORDER — SIMETHICONE 80 MG PO CHEW
80.0000 mg | CHEWABLE_TABLET | Freq: Three times a day (TID) | ORAL | Status: DC
  Administered 2013-03-20 – 2013-03-23 (×10): 80 mg via ORAL

## 2013-03-20 MED ORDER — SENNOSIDES-DOCUSATE SODIUM 8.6-50 MG PO TABS
2.0000 | ORAL_TABLET | Freq: Every day | ORAL | Status: DC
  Administered 2013-03-20 – 2013-03-22 (×3): 2 via ORAL

## 2013-03-20 MED ORDER — NALOXONE HCL 0.4 MG/ML IJ SOLN: 0.4000 mg | INTRAMUSCULAR | Status: DC | PRN

## 2013-03-20 MED ORDER — BUPIVACAINE IN DEXTROSE 0.75-8.25 % IT SOLN
INTRATHECAL | Status: DC | PRN
  Administered 2013-03-20: 1.5 mL via INTRATHECAL

## 2013-03-20 MED ORDER — METOCLOPRAMIDE HCL 5 MG/ML IJ SOLN: 10.0000 mg | Freq: Three times a day (TID) | INTRAMUSCULAR | Status: DC | PRN

## 2013-03-20 MED ORDER — FENTANYL CITRATE 0.05 MG/ML IJ SOLN
INTRAMUSCULAR | Status: AC
  Filled 2013-03-20: qty 2

## 2013-03-20 MED ORDER — ONDANSETRON HCL 4 MG/2ML IJ SOLN
INTRAMUSCULAR | Status: AC
  Filled 2013-03-20: qty 2

## 2013-03-20 MED ORDER — OXYTOCIN 10 UNIT/ML IJ SOLN
40.0000 [IU] | INTRAVENOUS | Status: DC | PRN
  Administered 2013-03-20: 40 [IU] via INTRAVENOUS

## 2013-03-20 MED ORDER — PROMETHAZINE HCL 25 MG/ML IJ SOLN: 6.2500 mg | INTRAMUSCULAR | Status: DC | PRN

## 2013-03-20 SURGICAL SUPPLY — 33 items
APL SKNCLS STERI-STRIP NONHPOA (GAUZE/BANDAGES/DRESSINGS) ×2
BENZOIN TINCTURE PRP APPL 2/3 (GAUZE/BANDAGES/DRESSINGS) ×2 IMPLANT
CLOTH BEACON ORANGE TIMEOUT ST (SAFETY) ×2 IMPLANT
CONTAINER PREFILL 10% NBF 15ML (MISCELLANEOUS) IMPLANT
DRAPE LG THREE QUARTER DISP (DRAPES) ×2 IMPLANT
DRSG OPSITE POSTOP 4X10 (GAUZE/BANDAGES/DRESSINGS) ×2 IMPLANT
DURAPREP 26ML APPLICATOR (WOUND CARE) ×2 IMPLANT
ELECT REM PT RETURN 9FT ADLT (ELECTROSURGICAL) ×2
ELECTRODE REM PT RTRN 9FT ADLT (ELECTROSURGICAL) ×1 IMPLANT
EXTRACTOR VACUUM KIWI (MISCELLANEOUS) IMPLANT
EXTRACTOR VACUUM M CUP 4 TUBE (SUCTIONS) IMPLANT
GLOVE BIO SURGEON STRL SZ 6.5 (GLOVE) ×2 IMPLANT
GOWN STRL REIN XL XLG (GOWN DISPOSABLE) ×4 IMPLANT
KIT ABG SYR 3ML LUER SLIP (SYRINGE) ×1 IMPLANT
NDL HYPO 25X5/8 SAFETYGLIDE (NEEDLE) IMPLANT
NEEDLE HYPO 25X5/8 SAFETYGLIDE (NEEDLE) ×2 IMPLANT
NS IRRIG 1000ML POUR BTL (IV SOLUTION) ×2 IMPLANT
PACK C SECTION WH (CUSTOM PROCEDURE TRAY) ×2 IMPLANT
PAD OB MATERNITY 4.3X12.25 (PERSONAL CARE ITEMS) ×2 IMPLANT
RTRCTR C-SECT PINK 25CM LRG (MISCELLANEOUS) ×2 IMPLANT
STAPLER VISISTAT 35W (STAPLE) IMPLANT
STRIP CLOSURE SKIN 1/2X4 (GAUZE/BANDAGES/DRESSINGS) ×2 IMPLANT
SUT CHROMIC 1 CTX 36 (SUTURE) ×4 IMPLANT
SUT PLAIN 0 NONE (SUTURE) IMPLANT
SUT PLAIN 2 0 XLH (SUTURE) ×1 IMPLANT
SUT VIC AB 0 CT1 27 (SUTURE) ×4
SUT VIC AB 0 CT1 27XBRD ANBCTR (SUTURE) ×2 IMPLANT
SUT VIC AB 2-0 CT1 27 (SUTURE) ×4
SUT VIC AB 2-0 CT1 TAPERPNT 27 (SUTURE) IMPLANT
SUT VIC AB 4-0 KS 27 (SUTURE) ×1 IMPLANT
TOWEL OR 17X24 6PK STRL BLUE (TOWEL DISPOSABLE) ×6 IMPLANT
TRAY FOLEY CATH 14FR (SET/KITS/TRAYS/PACK) ×2 IMPLANT
WATER STERILE IRR 1000ML POUR (IV SOLUTION) ×1 IMPLANT

## 2013-03-20 NOTE — Transfer of Care (Signed)
Immediate Anesthesia Transfer of Care Note  Patient: Linda [name]  Procedure(s) Performed: Procedure(s): CESAREAN SECTION (N/A)  Patient Location: PACU  Anesthesia Type:Spinal  Level of Consciousness: awake, alert  and oriented  Airway & Oxygen Therapy: Patient Spontanous Breathing  Post-op Assessment: Report given to PACU RN and Post -op Vital signs reviewed and stable  Post vital signs: Reviewed and stable  Complications: No apparent anesthesia complications

## 2013-03-20 NOTE — Progress Notes (Signed)
Back from ultrasound. Dr [name] at bedside discussing plan of care.

## 2013-03-20 NOTE — Anesthesia Postprocedure Evaluation (Signed)
Anesthesia Post Note  Patient: Linda [name]  Procedure(s) Performed: Procedure(s) (LRB): CESAREAN SECTION (N/A)  Anesthesia type: Spinal  Patient location: PACU  Post pain: Pain level controlled  Post assessment: Post-op Vital signs reviewed  Last Vitals:  Filed Vitals:   03/20/13 1048  BP: 136/58  Pulse: 94  Temp: 36.8 C  Resp: 18    Post vital signs: Reviewed  Level of consciousness: awake  Complications: No apparent anesthesia complications

## 2013-03-20 NOTE — Anesthesia Procedure Notes (Signed)
Spinal  Patient location during procedure: OR Start time: 03/20/2013 11:15 AM End time: 03/20/2013 11:23 AM Staffing Anesthesiologist: Sandrea Hughs Performed by: anesthesiologist  Preanesthetic Checklist Completed: patient identified, site marked, surgical consent, pre-op evaluation, timeout performed, IV checked, risks and benefits discussed and monitors and equipment checked Spinal Block Patient position: sitting Prep: DuraPrep Patient monitoring: heart rate, cardiac monitor, continuous pulse ox and blood pressure Approach: midline Location: L3-4 Injection technique: single-shot Needle Needle type: Sprotte  Needle gauge: 24 G Needle length: 9 cm Needle insertion depth: 7 cm Assessment Sensory level: T4

## 2013-03-20 NOTE — Brief Op Note (Signed)
03/18/2013 - 03/20/2013  12:33 PM  PATIENT:  Linda [name]  22 y.o. female  PRE-OPERATIVE DIAGNOSIS:  Abruption                                                       [redacted] week gestation                                                       BPP 4/8  POST-OPERATIVE DIAGNOSIS:  same  PROCEDURE:  Procedure(s): CESAREAN SECTION (N/A)   High Transverse--(about 1/3 up the fundus) with 2 layer closure  SURGEON:  Surgeon(s) and Role:    * [name], MD - Primary  ANESTHESIA:   spinal  EBL:  Total I/O In: 2400 [I.V.:2400] Out: 900 [Urine:200; Blood:700]  BLOOD ADMINISTERED:none  DRAINS: Urinary Catheter (Foley)   LOCAL MEDICATIONS USED:  NONE  SPECIMEN:  Placenta to pathology  DISPOSITION OF SPECIMEN:  PATHOLOGY  COUNTS:  YES  TOURNIQUET:  * No tourniquets in log *  DICTATION: .Dragon Dictation  PLAN OF CARE: Admit to inpatient   PATIENT DISPOSITION:  PACU - hemodynamically stable.

## 2013-03-20 NOTE — Progress Notes (Signed)
To mfm for ultrasound via wheelchair.

## 2013-03-20 NOTE — Progress Notes (Signed)
Patient ID: CARENA STREAM, female   DOB: 07-02-1991, 22 y.o.   MRN: [id number] Phone call from Dr. Harlon Flor recommending immediate delivery of [name] for probable abruption and BPP of 4/8.  Very little fluid around baby, mostly clot.  Pt has been seen by NICU prior and they are notified.  Pt counseled on risks and benefits of c-section including bleeding, infection and possible damage to bowel and bladder.  Pt understands and agrees to proceed.

## 2013-03-20 NOTE — Op Note (Signed)
Operative note  Preoperative diagnosis Preterm pregnancy at 31 weeks Placental abruption Nonreassuring biophysical profile of 4/8  Postoperative diagnosis Same  Procedure Primary cesarean section with uterine incision slightly above the lower uterine segment and transverse  Surgeon Dr. Huel Cote  Anesthesia Spinal  Fluids Estimated blood loss 800 cc Urine output 150 cc clear urine IV fluids 2200 cc LR  Findings A viable female infant in the vertex presentation.  Apgars 6 and 6 and 7. Weight pending at time of dictation. The ovaries and tubes appeared normal. The lower uterine segment was slightly narrowed, a transverse incision was made approximately 1/3 of the way up the fundus and once the baby delivered appeared to be slightly higher than the lower uterine segment.  Specimen Placenta sent to pathology  Procedure note After informed consent was obtained from the patient on the anterior the unit she was moved to the operating room and spinal anesthesia was obtained without difficulty. She was prepped and draped in the normal sterile fashion in the dorsal supine position with a leftward tilt. An appropriate time out was performed. Fetal monitoring just prior to the prep was reassuring. A Pfannenstiel skin incision was then made with the scalpel and carried through to the underlying layer of fascia by sharp dissection and Bovie cautery. The fascia was then nicked in the midline and the incision extended laterally with Mayo scissors. The inferior aspect the incision was grasped Coker clamps elevated and dissected off the underlying rectus muscles. The superior aspect was likewise dissected off the rectus muscles. Rectus muscles were separated in the midline and the peritoneal cavity entered bluntly. The peritoneal incision was then extended with with careful attention to avoid both bowel and bladder. The Alexis retractor was then placed within the incision. The lower uterine segment  was carefully examined and though slightly narrowed did appear large enough for a transverse incision. This was made with the scalpel and the uterine cavity itself entered bluntly. The infant's head was then delivered atraumatically and the nose and mouth bulb suctioned. There were some clots noted to be in the amniotic fluid when the baby delivered and the placenta had adherent clots as well. The cord was clamped and cut and infant handed to the waiting NICU team with a good cry.  The placenta was delivered and handed off to pathology. The uterus was then exteriorized and cleared of all clots and debris. At this point it was carefully examined and seen that the uterine incision did appear to be slightly above the lower uterine segment, and was higher on the fundus then a traditional low transverse C-section. The uterine incision was then carefully closed in 2 layers the first a running locked layer of 1-0 chromic and the second an imbricating layer of the same suture. This was reinforced with 3-0 Vicryl at the right angle and along the midline where there was some minor serosal bleeding noted. When all appeared hemostatic the uterus was removed to the abdomen and the incision observed for several minutes. There was no active bleeding noted so all instruments and sponges were removed from the abdomen as well as the Alexis retractor. The rectus muscles and peritoneum were then closed with several interrupted mattress sutures of 2-0 Vicryl. The fascia was then closed with 0 Vicryl in a running fashion. The subcutaneous tissue was closed with 30 plain in a running fashion. The skin was closed in a subcuticular stitch of 4-0 Vicryl on a Keith needle. Benzoin and Steri-Strips were utilized to  reinforce the incision and all instruments and sponge counts were again correct. The patient was taken to the recovery room and the baby to the NICU stable.

## 2013-03-20 NOTE — Progress Notes (Signed)
Linda [name]  was seen today for an ultrasound appointment.  See full report in [facility].  Comments: [name] is currently admitted due to vaginal bleeding, abdominal pain and suspected placental abruption. She is currently on continuous fetal monitoring which has overall been reassuring.  She has completed a course of betamethasone.  Oligohydramnios is noted with an AFI of 3.7 cm.  Near the uterine fundus, there appears to be some particulate matter in the amniotic cavity that is suspicisous for blood clots.  Limited views of the fetal anatomy were obtained due to oligohydramnios and maternal body habitus.  The patient reports decreased fetal movement through most of the morning.  Impression: Single IUP at 31 0/7 weeks Limited views of the fetal anatomy obtained due to olighydramnios. ? clot noted in the amniotic cavity consistent with history of suspected placental abruption Estimated fetal weight at the 80th %tile for gestational age Oligohydramnios noted with an AFI of 3.7 cm. BPP 4/8  Recommendations: Recommend delivery based on suspected placental abruption and BPP of 4/8. Findings and recommendations discussed with Dr. [name].  Patient will return to antepartum unit for continuous fetal monitoriing pending delivery.  Alpha Gula, MD

## 2013-03-20 NOTE — Progress Notes (Signed)
Patient ID: Linda [name], female   DOB: 1991-08-18, 22 y.o.   MRN: [id number]  Pt c/o lower abdominal cramping since 4 am.  States unsure re fetal movement, no LOF, cont sm VB, occ cramping  AFVSS  gen NAD, uncomf  FHTs 140's, mod var toco occ, irritability  Abd soft, no guarding, rebound.  Poorly reproducible pain  22yo G1 at 31+ with ? Abruption/abd pain and VB MFM [place] today Cont close monitoring diet to clear fluids

## 2013-03-20 NOTE — Anesthesia Preprocedure Evaluation (Signed)
Anesthesia Evaluation  Patient identified by MRN, date of birth, ID band Patient awake    Reviewed: Allergy & Precautions, H&P , NPO status , Patient's Chart, lab work & pertinent test results  Airway Mallampati: II TM Distance: >3 FB Neck ROM: full    Dental no notable dental hx.    Pulmonary    Pulmonary exam normal       Cardiovascular negative cardio ROS      Neuro/Psych    GI/Hepatic negative GI ROS, Neg liver ROS,   Endo/Other  negative endocrine ROS  Renal/GU negative Renal ROS     Musculoskeletal negative musculoskeletal ROS (+)   Abdominal (+) + obese,   Peds negative pediatric ROS (+)  Hematology negative hematology ROS (+)   Anesthesia Other Findings   Reproductive/Obstetrics (+) Pregnancy                           Anesthesia Physical Anesthesia Plan  ASA: II and emergent  Anesthesia Plan: Spinal   Post-op Pain Management:    Induction:   Airway Management Planned:   Additional Equipment:   Intra-op Plan:   Post-operative Plan:   Informed Consent: I have reviewed the patients History and Physical, chart, labs and discussed the procedure including the risks, benefits and alternatives for the proposed anesthesia with the patient or authorized representative who has indicated his/her understanding and acceptance.     Plan Discussed with:   Anesthesia Plan Comments:         Anesthesia Quick Evaluation

## 2013-03-21 ENCOUNTER — Encounter (HOSPITAL_COMMUNITY): Payer: Self-pay | Admitting: Obstetrics and Gynecology

## 2013-03-21 LAB — CBC
MCV: 89.2 fL (ref 78.0–100.0)
Platelets: 216 10*3/uL (ref 150–400)
RBC: 2.49 MIL/uL — ABNORMAL LOW (ref 3.87–5.11)
RDW: 14.2 % (ref 11.5–15.5)
WBC: 12.4 10*3/uL — ABNORMAL HIGH (ref 4.0–10.5)

## 2013-03-21 MED ORDER — PNEUMOCOCCAL VAC POLYVALENT 25 MCG/0.5ML IJ INJ
0.5000 mL | INJECTION | INTRAMUSCULAR | Status: AC
  Administered 2013-03-22: 0.5 mL via INTRAMUSCULAR
  Filled 2013-03-21: qty 0.5

## 2013-03-21 NOTE — Anesthesia Postprocedure Evaluation (Signed)
Anesthesia Post Note  Patient: Linda [name]  Procedure(s) Performed: Procedure(s) (LRB): CESAREAN SECTION (N/A)  Anesthesia type: Spinal  Patient location: Women's unit  Post pain: Pain level controlled  Post assessment: Post-op Vital signs reviewed  Last Vitals:  Filed Vitals:   03/21/13 0534  BP: 110/51  Pulse: 79  Temp: 36.9 C  Resp: 19    Post vital signs: Reviewed  Level of consciousness: awake  Complications: No apparent anesthesia complications

## 2013-03-21 NOTE — Progress Notes (Signed)
CSW attempted to meet with MOB to complete assessment for NICU admission, but she was in the bathroom and requested that CSW come back at a later time.

## 2013-03-21 NOTE — Progress Notes (Signed)
Subjective: Postpartum Day 1: Cesarean Delivery Patient reports tolerating PO.  Has sat in chair but not ambulated much yet.  Pain controlled.  Foley out, but has not voided yet.  Objective: Vital signs in last 24 hours: Temp:  [97.5 F (36.4 C)-99.4 F (37.4 C)] 98.5 F (36.9 C) (05/07 0534) Pulse Rate:  [69-94] 79 (05/07 0534) Resp:  [18-22] 19 (05/07 0534) BP: (101-136)/(50-96) 110/51 mmHg (05/07 0534) SpO2:  [97 %-100 %] 98 % (05/07 0534)  Physical Exam:  General: alert and cooperative Lochia: appropriate Uterine Fundus: firm Incision: C/D/I    Recent Labs  03/20/13 0825 03/21/13 0533  HGB 8.7* 7.1*  HCT 27.4* 22.2*    Assessment/Plan: Status post Cesarean section. Doing well postoperatively.  Continue current care.  Will see if symptomatic with anemia when ambulating.  Briefly d/w pt transfusion  Linda [name] W 03/21/2013, 8:44 AM

## 2013-03-22 LAB — TYPE AND SCREEN
Antibody Screen: NEGATIVE
Unit division: 0
Unit division: 0

## 2013-03-22 NOTE — Progress Notes (Signed)
Subjective: Postpartum Day 2 Cesarean Delivery Patient reports tolerating PO, + flatus and no problems voiding.    Objective: Vital signs in last 24 hours: Temp:  [97.7 F (36.5 C)-98.4 F (36.9 C)] 97.7 F (36.5 C) (05/08 0534) Pulse Rate:  [64-80] 64 (05/08 0534) Resp:  [18-20] 18 (05/08 0534) BP: (107-121)/(51-73) 116/53 mmHg (05/08 0534) SpO2:  [99 %-100 %] 99 % (05/07 2220)  Physical Exam:  General: alert and cooperative Lochia: appropriate Uterine Fundus: firm Incision: C/D/I    Recent Labs  03/20/13 0825 03/21/13 0533  HGB 8.7* 7.1*  HCT 27.4* 22.2*    Assessment/Plan: Status post Cesarean section. Doing well postoperatively.  Continue current care. Baby doing well in NICU.  Linda [name] 03/22/2013, 9:27 AM

## 2013-03-22 NOTE — Discharge Summary (Signed)
Obstetric Discharge Summary Reason for Admission: cesarean section for placental abruption Prenatal Procedures: NST and ultrasound Intrapartum Procedures: c-section high transverse Postpartum Procedures: none Complications-Operative and Postpartum: anemia Hemoglobin  Date Value Range Status  03/21/2013 7.1* 12.0 - 15.0 g/dL Final     DELTA CHECK NOTED     REPEATED TO VERIFY     HCT  Date Value Range Status  03/21/2013 22.2* 36.0 - 46.0 % Final    Physical Exam:  General: alert and cooperative Lochia: appropriate Uterine Fundus: firm Incision: C/D/I  Discharge Diagnoses: Preterm pregnancy delivered at 31+weeks                                         Placental abruption  Discharge Information: Date: 03/23/2013 Activity: pelvic rest Diet: routine Medications: Ibuprofen and Percocet Condition: improved Instructions: refer to practice specific booklet Discharge to: home Follow-up Information   Follow up with [name], MD. Schedule an appointment as soon as possible for a visit in 2 weeks. (incision check)    Contact information:   [address], [address] [place] [address] [phone]       Newborn Data: Live born female  Birth Weight: 4 lb 11.5 oz (2140 g) APGAR: 6, 6  Home with In NICU.  [name] 03/23/2013, 9:21 AM

## 2013-03-23 MED ORDER — OXYCODONE-ACETAMINOPHEN 5-325 MG PO TABS: 1.0000 | ORAL_TABLET | ORAL | Status: DC | PRN

## 2013-03-23 MED ORDER — IBUPROFEN 600 MG PO TABS: 600.0000 mg | ORAL_TABLET | Freq: Four times a day (QID) | ORAL | Status: DC

## 2013-03-23 NOTE — Progress Notes (Signed)
Subjective: Postpartum Day 3: Cesarean Delivery Patient reports tolerating PO, + flatus and no problems voiding.    Objective: Vital signs in last 24 hours: Temp:  [97.9 F (36.6 C)-98.3 F (36.8 C)] 97.9 F (36.6 C) (05/09 0539) Pulse Rate:  [65-88] 65 (05/09 0539) Resp:  [16-18] 16 (05/09 0539) BP: (108-124)/(55-79) 124/79 mmHg (05/09 0539) SpO2:  [96 %-100 %] 96 % (05/09 0539)  Physical Exam:  General: alert and cooperative Lochia: appropriate Uterine Fundus: firm Incision: C/D/I    Recent Labs  03/21/13 0533  HGB 7.1*  HCT 22.2*    Assessment/Plan: Status post Cesarean section. Doing well postoperatively.  Discharge home with standard precautions and return to office in 2 weeks.  Linda [name] 03/23/2013, 9:17 AM

## 2013-03-23 NOTE — Progress Notes (Signed)
Pt ambulated out  Teaching complete 

## 2013-03-23 NOTE — Lactation Note (Signed)
This note was copied from the chart of Linda Tarynn Garling. Lactation Consultation Note Mom's RN called to request a pump rental. Instructed mom to complete her paper work and take it to [facility] to complete the rental and get her pump. Dad states he knows where [facility] is located. Instructed mom to take her pump parts with her. Mom has no other questions or concerns.  Patient Name: Linda [name] [id number] Date: 03/23/2013     Maternal Data    Feeding Feeding Type: Breast Milk with Formula added Feeding method: Tube/Gavage Length of feed: 30 min  LATCH Score/Interventions                      Lactation Tools Discussed/Used     Consult Status      Lenard Forth 03/23/2013, 2:47 PM

## 2013-03-23 NOTE — Progress Notes (Signed)
Clinical Social Work Department PSYCHOSOCIAL ASSESSMENT - MATERNAL/CHILD 03/21/2013  Patient:  Linda [name],Linda [name]  Account Number:  [id number]  Admit Date:  03/18/2013  Childs Name:   Linda [name]    Clinical Social Worker:  Deyja Sochacki, LCSW   Date/Time:  03/21/2013 04:30 PM  Date Referred:  03/21/2013   Referral source  NICU     Referred reason  NICU   Other referral source:    I:  FAMILY / HOME ENVIRONMENT Child's legal guardian:  PARENT  Guardian - Name Guardian - Age Guardian - Address  Mekia Heathcock [address], [place], [place] [address]  Damien [name]     Other household support members/support persons Other support:   MOB states she has a great support system of family and friends.  She and FOB are in a relationship and he is involved and supportive.    II  PSYCHOSOCIAL DATA Information Source:  Patient Interview  Financial and Community Resources Employment:   Financial resources:  Private Insurance If Medicaid - County:  [place]  School / Grade:   Maternity Care Coordinator / Child Services Coordination / Early Interventions:  Cultural issues impacting care:   None indicated    III  STRENGTHS Strengths  Adequate Resources  Compliance with medical plan  Home prepared for Child (including basic supplies)  Supportive family/friends  Other - See comment  Understanding of illness   Strength comment:  CSW will provide MOB with a pediatrician list   IV  RISK FACTORS AND CURRENT PROBLEMS Current Problem:  YES   Risk Factor & Current Problem Patient Issue Family Issue Risk Factor / Current Problem Comment  Mental Illness Y N MOB-Anxiety   N N     V  SOCIAL WORK ASSESSMENT  CSW met with MOB in the 3rd floor hallway while she was walking with her RN.  CSW asked if CSW could meet with her in her room when she was done walking and she said that we could talk while she walked.  CSW did not think was ideal, as it was not as private as her room would  be, but CSW agreed to talk with her in the halls.  The assessment was also somewhat limited by her level of pain, which she seemed to be most focused on.  She reports having a great support system and everything she needs at home for baby.  She states she, FOB and their families are very happy and excited about the baby.  She seems to have a fairly good understanding of the situation, but again, her pain seems to be her main focus at this time.  CSW asked her about her hx of Anx when no one else was around.  She states her symptoms are minimal at this time and she is currently not taking medications for it.  CSW discussed signs and symptoms of PPD as well as common emotions related to the NICU experience and she states she feels comfortable talking with her doctor if symptoms arise.  CSW explained ongoing support services offered by NICU CSW and gave contact information.  MOB states no questions or needs at this time and states no issues with transportation.  CSW informed MOB of [facility] as an option to stay closer to the baby at times, but stated that it is not available this week.  CSW gave MOB the phone number for [facility] [place] [facility] in order to follow up directly if she is interested in staying at the   house at any point in time during baby's hospitalization.   VI SOCIAL WORK PLAN Social Work Plan  Psychosocial Support/Ongoing Assessment of Needs   Type of pt/family education:   PPD/Common emotions related to the NICU   If child protective services report - county:   If child protective services report - date:   Information/referral to community resources comment:   No referral needs noted at this time.   Other social work plan:    

## 2013-03-24 ENCOUNTER — Encounter (HOSPITAL_COMMUNITY): Payer: Self-pay | Admitting: *Deleted

## 2013-03-24 ENCOUNTER — Inpatient Hospital Stay (HOSPITAL_COMMUNITY)
Admission: AD | Admit: 2013-03-24 | Discharge: 2013-03-24 | Disposition: A | Discharge status: Home / Self Care | Payer: 59 | Source: Ambulatory Visit | Attending: Obstetrics and Gynecology | Admitting: Obstetrics and Gynecology

## 2013-03-24 DIAGNOSIS — O864 Pyrexia of unknown origin following delivery: Secondary | ICD-10-CM | POA: Insufficient documentation

## 2013-03-24 DIAGNOSIS — F411 Generalized anxiety disorder: Secondary | ICD-10-CM

## 2013-03-24 DIAGNOSIS — O9279 Other disorders of lactation: Secondary | ICD-10-CM

## 2013-03-24 LAB — URINALYSIS, ROUTINE W REFLEX MICROSCOPIC
Nitrite: NEGATIVE
Specific Gravity, Urine: 1.01 (ref 1.005–1.030)
Urobilinogen, UA: 0.2 mg/dL (ref 0.0–1.0)
pH: 7.5 (ref 5.0–8.0)

## 2013-03-24 LAB — URINE MICROSCOPIC-ADD ON

## 2013-03-24 MED ORDER — CETIRIZINE-PSEUDOEPHEDRINE ER 5-120 MG PO TB12: 1.0000 | ORAL_TABLET | Freq: Every day | ORAL | Status: DC

## 2013-03-24 MED ORDER — POTASSIUM CHLORIDE ER 10 MEQ PO TBCR: 10.0000 meq | EXTENDED_RELEASE_TABLET | Freq: Two times a day (BID) | ORAL | Status: DC

## 2013-03-24 MED ORDER — LORAZEPAM 0.5 MG PO TABS: 0.5000 mg | ORAL_TABLET | Freq: Three times a day (TID) | ORAL | Status: DC

## 2013-03-24 MED ORDER — MONTELUKAST SODIUM 10 MG PO TABS: 10.0000 mg | ORAL_TABLET | Freq: Every day | ORAL | Status: DC

## 2013-03-24 NOTE — MAU Provider Note (Signed)
History     CSN: [id number]  Arrival date and time: 03/24/13 1330   First Provider Initiated Contact with Patient 03/24/13 1433      Chief Complaint  Patient presents with  . Fever   HPI This is a 22 y.o. female who is 4 days post C/S who presents with c/o fever last night with chills, hot/cold, and feeling short of breath when she tries to take a deep breath.  Baby is in NICU so pt is pumping.  RN Note: Pt reports she is post partum 03/20/13 had emergency c-section for an abruption. Discharged yesterday from hospital. Had fever last night 100.6. today still having chills and feels a little SOB and having chest pain when she deep breaths       OB History   Grav Para Term Preterm Abortions TAB SAB Ect Mult Living   1 1 0 1 0 0 0 0 0 1       Past Medical History  Diagnosis Date  . Asthma   . IBS (irritable bowel syndrome)   . Heart murmur   . Interstitial cystitis   . Anxiety     Past Surgical History  Procedure Laterality Date  . Upper gi endoscopy    . Tympanostomy tube placement    . No past surgeries    . Cesarean section N/A 03/20/2013    Procedure: CESAREAN SECTION;  Surgeon: [name], MD;  Location: [facility];  Service: Obstetrics;  Laterality: N/A;    Family History  Problem Relation Age of Onset  . Cancer Paternal Uncle   . COPD Maternal Grandfather   . Stroke Paternal Grandfather     History  Substance Use Topics  . Smoking status: Former Smoker -- 0.50 packs/day    Types: Cigarettes  . Smokeless tobacco: Never Used  . Alcohol Use: No    Allergies:  Allergies  Allergen Reactions  . Sulfa Antibiotics Diarrhea and Nausea Only    Prescriptions prior to admission  Medication Sig Dispense Refill  . albuterol (PROVENTIL HFA;VENTOLIN HFA) 108 (90 BASE) MCG/ACT inhaler Inhale 2 puffs into the lungs 2 (two) times daily as needed. For shortness of breath/asthma      . ibuprofen (ADVIL,MOTRIN) 600 MG tablet Take 1 tablet (600 mg total) by mouth  every 6 (six) hours.  30 tablet  1  . Pediatric Multiple Vit-C-FA (FLINSTONES GUMMIES OMEGA-3 DHA PO) Take 2 each by mouth daily.      . simethicone (MYLICON) 80 MG chewable tablet Chew 80 mg by mouth every 6 (six) hours as needed for flatulence.      Marland Kitchen oxyCODONE-acetaminophen (PERCOCET/ROXICET) 5-325 MG per tablet Take 1-2 tablets by mouth every 4 (four) hours as needed.  30 tablet  0    Review of Systems  Constitutional: Positive for fever and chills. Negative for malaise/fatigue.  HENT: Positive for congestion and sore throat.   Respiratory: Positive for shortness of breath. Negative for wheezing.   Gastrointestinal: Negative for nausea, vomiting, abdominal pain, diarrhea and constipation.  Neurological: Positive for weakness. Negative for headaches.  Psychiatric/Behavioral: The patient is nervous/anxious.    Physical Exam   Blood pressure 124/66, pulse 83, temperature 98.4 F (36.9 C), resp. rate 18, height 5' 5.5" (1.664 m), weight 230 lb 3.2 oz (104.418 kg), SpO2 97.00%, currently breastfeeding.  Physical Exam  Constitutional: She is oriented to person, place, and time. She appears well-developed and well-nourished. No distress.  HENT:  Head: Normocephalic.  Cardiovascular: Normal rate.   Respiratory: Effort  normal and breath sounds normal. No respiratory distress. She has no wheezes. She has no rales. She exhibits no tenderness.  Breasts engorged No overt mastitis  GI: Soft.  Musculoskeletal: Normal range of motion.  Neurological: She is alert and oriented to person, place, and time.  Skin: Skin is warm and dry.  Psychiatric: She has a normal mood and affect.    MAU Course  Procedures   Assessment and Plan  A:  Postpartum fever      Probable engorgement, doubt mastitis P:  Discussed with Dr Ellyn Hack       Discussed pumping and massaging breasts to empty       Will add zyrtec and singulair for allergy/asthma      Limited Rx for ativan, advised use half tablet first to  minimize risk to baby  [facility] 03/24/2013, 2:48 PM

## 2013-03-24 NOTE — MAU Note (Signed)
Pt reports she is post partum 03/20/13 had emergency c-section for an abruption. Discharged yesterday from hospital. Had fever last night 100.6. today still having chills and feels a little SOB and having chest pain when she deep breaths

## 2013-03-24 NOTE — MAU Note (Signed)
Pt has swelling in lower extremities.

## 2013-03-25 LAB — URINE CULTURE

## 2013-04-03 ENCOUNTER — Inpatient Hospital Stay (HOSPITAL_COMMUNITY)
Admission: AD | Admit: 2013-04-03 | Discharge: 2013-04-03 | Disposition: A | Discharge status: Home / Self Care | Payer: 59 | Source: Ambulatory Visit | Attending: Obstetrics and Gynecology | Admitting: Obstetrics and Gynecology

## 2013-04-03 DIAGNOSIS — Z4889 Encounter for other specified surgical aftercare: Secondary | ICD-10-CM

## 2013-04-03 DIAGNOSIS — O909 Complication of the puerperium, unspecified: Secondary | ICD-10-CM | POA: Insufficient documentation

## 2013-04-03 NOTE — MAU Note (Signed)
Pt concerned incision is raised and red, unsure if is normal

## 2013-04-03 NOTE — MAU Provider Note (Signed)
History     CSN: [id number]  Arrival date and time: 04/03/13 1436   None     Chief Complaint  Patient presents with  . Wound Check   HPI This is a 22 y.o. female who is 2 weeks post C/S who presents with c/o incision being raised, when it was not raised before. Denies pain there or redness.   Has been back to office for incision check.  RN Note: Pt concerned incision is raised and red, unsure if is normal      OB History   Grav Para Term Preterm Abortions TAB SAB Ect Mult Living   1 1 0 1 0 0 0 0 0 1       Past Medical History  Diagnosis Date  . Asthma   . IBS (irritable bowel syndrome)   . Heart murmur   . Interstitial cystitis   . Anxiety     Past Surgical History  Procedure Laterality Date  . Upper gi endoscopy    . Tympanostomy tube placement    . No past surgeries    . Cesarean section N/A 03/20/2013    Procedure: CESAREAN SECTION;  Surgeon: [name], MD;  Location: [facility];  Service: Obstetrics;  Laterality: N/A;    Family History  Problem Relation Age of Onset  . Cancer Paternal Uncle   . COPD Maternal Grandfather   . Stroke Paternal Grandfather     History  Substance Use Topics  . Smoking status: Former Smoker -- 0.50 packs/day    Types: Cigarettes  . Smokeless tobacco: Never Used  . Alcohol Use: No    Allergies:  Allergies  Allergen Reactions  . Sulfa Antibiotics Diarrhea and Nausea Only    Prescriptions prior to admission  Medication Sig Dispense Refill  . albuterol (PROVENTIL HFA;VENTOLIN HFA) 108 (90 BASE) MCG/ACT inhaler Inhale 2 puffs into the lungs 2 (two) times daily as needed. For shortness of breath/asthma      . cetirizine-pseudoephedrine (ZYRTEC-D) 5-120 MG per tablet Take 1 tablet by mouth daily.  30 tablet  0  . ibuprofen (ADVIL,MOTRIN) 600 MG tablet Take 1 tablet (600 mg total) by mouth every 6 (six) hours.  30 tablet  1  . LORazepam (ATIVAN) 0.5 MG tablet Take 1 tablet (0.5 mg total) by mouth every 8 (eight) hours.   10 tablet  0  . montelukast (SINGULAIR) 10 MG tablet Take 1 tablet (10 mg total) by mouth at bedtime.  10 tablet  0  . oxyCODONE-acetaminophen (PERCOCET/ROXICET) 5-325 MG per tablet Take 1-2 tablets by mouth every 4 (four) hours as needed.  30 tablet  0  . Pediatric Multiple Vit-C-FA (FLINSTONES GUMMIES OMEGA-3 DHA PO) Take 2 each by mouth daily.      . potassium chloride (K-DUR) 10 MEQ tablet Take 1 tablet (10 mEq total) by mouth 2 (two) times daily.  8 tablet  0  . simethicone (MYLICON) 80 MG chewable tablet Chew 80 mg by mouth every 6 (six) hours as needed for flatulence.        Review of Systems  Constitutional: Negative for fever, chills and malaise/fatigue.  Gastrointestinal: Negative for abdominal pain.       Edges of incision seem raised   Physical Exam   currently breastfeeding.  Physical Exam  Constitutional: She is oriented to person, place, and time. She appears well-developed and well-nourished. No distress.  Cardiovascular: Normal rate.   Respiratory: Effort normal.  GI: Soft. There is no tenderness. There is no rebound and  no guarding.  Incision well approximated No erethema There may be slight induration of incision but no edema or abnormality obvious Nontender  Musculoskeletal: Normal range of motion.  Neurological: She is alert and oriented to person, place, and time.  Skin: Skin is warm and dry.  Psychiatric: She has a normal mood and affect.    MAU Course  Procedures   Assessment and Plan  A:  Post Cesarean Delivery      Normal Incisional healing  P:  Discussed with Dr Jackelyn Knife      Pt to followup as scheduled for 6 week check up  [facility] 04/03/2013, 3:47 PM

## 2013-04-07 ENCOUNTER — Inpatient Hospital Stay (HOSPITAL_COMMUNITY)
Admission: AD | Admit: 2013-04-07 | Discharge: 2013-04-07 | Disposition: A | Discharge status: Home / Self Care | Payer: 59 | Source: Ambulatory Visit | Attending: Obstetrics and Gynecology | Admitting: Obstetrics and Gynecology

## 2013-04-07 ENCOUNTER — Encounter (HOSPITAL_COMMUNITY): Payer: Self-pay | Admitting: Family

## 2013-04-07 DIAGNOSIS — O9081 Anemia of the puerperium: Secondary | ICD-10-CM | POA: Insufficient documentation

## 2013-04-07 DIAGNOSIS — O909 Complication of the puerperium, unspecified: Secondary | ICD-10-CM | POA: Insufficient documentation

## 2013-04-07 DIAGNOSIS — D649 Anemia, unspecified: Secondary | ICD-10-CM

## 2013-04-07 DIAGNOSIS — R002 Palpitations: Secondary | ICD-10-CM | POA: Insufficient documentation

## 2013-04-07 DIAGNOSIS — R209 Unspecified disturbances of skin sensation: Secondary | ICD-10-CM | POA: Insufficient documentation

## 2013-04-07 DIAGNOSIS — D6489 Other specified anemias: Secondary | ICD-10-CM | POA: Insufficient documentation

## 2013-04-07 LAB — URINE MICROSCOPIC-ADD ON

## 2013-04-07 LAB — CBC
HCT: 32.8 % — ABNORMAL LOW (ref 36.0–46.0)
RBC: 3.81 MIL/uL — ABNORMAL LOW (ref 3.87–5.11)
RDW: 13.1 % (ref 11.5–15.5)

## 2013-04-07 LAB — URINALYSIS, ROUTINE W REFLEX MICROSCOPIC
Nitrite: NEGATIVE
Specific Gravity, Urine: 1.015 (ref 1.005–1.030)
Urobilinogen, UA: 0.2 mg/dL (ref 0.0–1.0)
pH: 7.5 (ref 5.0–8.0)

## 2013-04-07 LAB — COMPREHENSIVE METABOLIC PANEL
AST: 11 U/L (ref 0–37)
Albumin: 3.3 g/dL — ABNORMAL LOW (ref 3.5–5.2)
Alkaline Phosphatase: 98 U/L (ref 39–117)
BUN: 14 mg/dL (ref 6–23)
Chloride: 102 mEq/L (ref 96–112)
Potassium: 4 mEq/L (ref 3.5–5.1)
Total Bilirubin: 0.5 mg/dL (ref 0.3–1.2)

## 2013-04-07 NOTE — MAU Provider Note (Signed)
History     CSN: [id number]  Arrival date and time: 04/07/13 1654   First Provider Initiated Contact with Patient 04/07/13 1735      Chief Complaint  Patient presents with  . Postpartum Complications   HPI Linda [name] 22 y.o. Client is 2 weeks postpartum from C/S delivery at 31 weeks due to placental abruption.  The baby is in NICU.  She is coming in today as she is not feeling well.  She has some concerns about a firm area that has developed under one side of her incision and is worried that it might be a blood collection there.  She is having some heart rate palpitations and is concerned her potassium level might be low.  It was low before discharge and she had a prescription for potassium supplement that she did take and finished in a few days.  She is feeling heaviness and tingling in her legs for the past couple of days - thinking it is related to low potassium as that is how her legs felt when her potassium was low.    OB History   Grav Para Term Preterm Abortions TAB SAB Ect Mult Living   1 1 0 1 0 0 0 0 0 1       Past Medical History  Diagnosis Date  . Asthma   . IBS (irritable bowel syndrome)   . Heart murmur   . Interstitial cystitis   . Anxiety     Past Surgical History  Procedure Laterality Date  . Upper gi endoscopy    . Tympanostomy tube placement    . No past surgeries    . Cesarean section N/A 03/20/2013    Procedure: CESAREAN SECTION;  Surgeon: [name], MD;  Location: [facility];  Service: Obstetrics;  Laterality: N/A;    Family History  Problem Relation Age of Onset  . Cancer Paternal Uncle   . COPD Maternal Grandfather   . Stroke Paternal Grandfather     History  Substance Use Topics  . Smoking status: Former Smoker -- 0.50 packs/day    Types: Cigarettes  . Smokeless tobacco: Never Used  . Alcohol Use: No    Allergies:  Allergies  Allergen Reactions  . Sulfa Antibiotics Diarrhea and Nausea Only    Prescriptions prior to  admission  Medication Sig Dispense Refill  . cetirizine-pseudoephedrine (ZYRTEC-D) 5-120 MG per tablet Take 1 tablet by mouth daily.  30 tablet  0  . ibuprofen (ADVIL,MOTRIN) 600 MG tablet Take 1 tablet (600 mg total) by mouth every 6 (six) hours.  30 tablet  1  . LORazepam (ATIVAN) 0.5 MG tablet Take 1 tablet (0.5 mg total) by mouth every 8 (eight) hours.  10 tablet  0  . Pediatric Multiple Vit-C-FA (FLINSTONES GUMMIES OMEGA-3 DHA PO) Take 2 each by mouth daily.      Marland Kitchen albuterol (PROVENTIL HFA;VENTOLIN HFA) 108 (90 BASE) MCG/ACT inhaler Inhale 2 puffs into the lungs 2 (two) times daily as needed. For shortness of breath/asthma        Review of Systems  Constitutional: Negative for fever.  Cardiovascular: Positive for palpitations.  Gastrointestinal: Negative for nausea, vomiting and abdominal pain.  Genitourinary: Negative for dysuria.       Lump behind C/S scar  Neurological:       Tingling in lower legs   Physical Exam   Blood pressure 112/65, pulse 92, temperature 97.9 F (36.6 C), temperature source Oral, resp. rate 18, height 5' 5.5" (1.664 m), weight  210 lb 6.4 oz (95.437 kg), SpO2 99.00%, currently breastfeeding.  Physical Exam  Nursing note and vitals reviewed. Constitutional: She is oriented to person, place, and time. She appears well-developed and well-nourished.  HENT:  Head: Normocephalic.  Eyes: EOM are normal.  Neck: Neck supple.  Cardiovascular: Normal rate, regular rhythm and normal heart sounds.   No murmur heard. Respiratory: Effort normal and breath sounds normal. No respiratory distress. She has no wheezes.  GI: Soft. There is no tenderness. There is no rebound and no guarding.  C/S scar is healing well.  No redness.  No tenderness with palpation.  No open areas.  No drainage.  Healing ridge of firmer tissue palpated with thicker area on right side.  Nontender.  Musculoskeletal: Normal range of motion.  Mild edema in both ankles and lower legs.   Neurological: She is alert and oriented to person, place, and time.  Skin: Skin is warm and dry.  Psychiatric: She has a normal mood and affect.    MAU Course  Procedures Results for orders placed during the hospital encounter of 04/07/13 (from the past 24 hour(s))  URINALYSIS, ROUTINE W REFLEX MICROSCOPIC     Status: Abnormal   Collection Time    04/07/13  5:00 PM      Result Value Range   Color, Urine YELLOW  YELLOW   APPearance CLEAR  CLEAR   Specific Gravity, Urine 1.015  1.005 - 1.030   pH 7.5  5.0 - 8.0   Glucose, UA NEGATIVE  NEGATIVE mg/dL   Hgb urine dipstick LARGE (*) NEGATIVE   Bilirubin Urine NEGATIVE  NEGATIVE   Ketones, ur NEGATIVE  NEGATIVE mg/dL   Protein, ur NEGATIVE  NEGATIVE mg/dL   Urobilinogen, UA 0.2  0.0 - 1.0 mg/dL   Nitrite NEGATIVE  NEGATIVE   Leukocytes, UA SMALL (*) NEGATIVE  URINE MICROSCOPIC-ADD ON     Status: Abnormal   Collection Time    04/07/13  5:00 PM      Result Value Range   Squamous Epithelial / LPF FEW (*) RARE   WBC, UA 7-10  <3 WBC/hpf   RBC / HPF TOO NUMEROUS TO COUNT  <3 RBC/hpf   Urine-Other MUCOUS PRESENT    CBC     Status: Abnormal   Collection Time    04/07/13  5:50 PM      Result Value Range   WBC 9.4  4.0 - 10.5 K/uL   RBC 3.81 (*) 3.87 - 5.11 MIL/uL   Hemoglobin 10.3 (*) 12.0 - 15.0 g/dL   HCT 40.9 (*) 81.1 - 91.4 %   MCV 86.1  78.0 - 100.0 fL   MCH 27.0  26.0 - 34.0 pg   MCHC 31.4  30.0 - 36.0 g/dL   RDW 78.2  95.6 - 21.3 %   Platelets 322  150 - 400 K/uL  COMPREHENSIVE METABOLIC PANEL     Status: Abnormal   Collection Time    04/07/13  5:50 PM      Result Value Range   Sodium 138  135 - 145 mEq/L   Potassium 4.0  3.5 - 5.1 mEq/L   Chloride 102  96 - 112 mEq/L   CO2 28  19 - 32 mEq/L   Glucose, Bld 90  70 - 99 mg/dL   BUN 14  6 - 23 mg/dL   Creatinine, Ser 0.86  0.50 - 1.10 mg/dL   Calcium 9.5  8.4 - 57.8 mg/dL   Total Protein 6.5  6.0 -  8.3 g/dL   Albumin 3.3 (*) 3.5 - 5.2 g/dL   AST 11  0 - 37 U/L    ALT 8  0 - 35 U/L   Alkaline Phosphatase 98  39 - 117 U/L   Total Bilirubin 0.5  0.3 - 1.2 mg/dL   GFR calc non Af Amer >90  >90 mL/min   GFR calc Af Amer >90  >90 mL/min   MDM Consult with Dr. Ambrose Mantle  - reviewed results  Assessment and Plan  Normal C/S incision Normal potassium level Improving anemia Heart rate palpitations  Plan Keep your postpartum checkup appointment. See your primary care doctor if your palpitations worsen. Do not sit for long periods.  Make sure you are walking around every hour. Call your doctor if you have questions or concerns.   Alonie Gazzola 04/07/2013, 5:49 PM

## 2013-04-07 NOTE — MAU Note (Signed)
Pt reports she has a "knot " behind the right side of her c-section scar.. Stated she just does not feel good. Having chest pain pressure around her heart and fees like she can't catch her breath at times. Pt had c-section  2 weeks ago for abruption baby still in NICU.Marland Kitchen

## 2013-04-07 NOTE — MAU Note (Addendum)
Patient presents to MAU with c/o feeling a "knot" under her c-section incision last night after cleaning; reports tenderness at site. Patient delivered via c/s on 5/6.  Denies fever, chills, or drainage from incision site. Patient is breastfeeding and baby is currently in NICU (delivered at [redacted]w[redacted]d)  Patient also reports having a low K+ level at discharge; was prescribed PO K+ supplement; reports heart palpitations and legs feeling heavy.

## 2013-04-13 ENCOUNTER — Encounter: Payer: Self-pay | Admitting: Family Medicine

## 2013-04-13 ENCOUNTER — Ambulatory Visit (INDEPENDENT_AMBULATORY_CARE_PROVIDER_SITE_OTHER): Payer: 59 | Admitting: Family Medicine

## 2013-04-13 VITALS — BP 124/72 | Temp 98.0°F | Wt 212.4 lb

## 2013-04-13 DIAGNOSIS — F419 Anxiety disorder, unspecified: Secondary | ICD-10-CM

## 2013-04-13 DIAGNOSIS — J4520 Mild intermittent asthma, uncomplicated: Secondary | ICD-10-CM

## 2013-04-13 DIAGNOSIS — F411 Generalized anxiety disorder: Secondary | ICD-10-CM

## 2013-04-13 DIAGNOSIS — J45909 Unspecified asthma, uncomplicated: Secondary | ICD-10-CM

## 2013-04-13 DIAGNOSIS — R300 Dysuria: Secondary | ICD-10-CM

## 2013-04-13 LAB — POCT URINALYSIS DIPSTICK
Spec Grav, UA: 1.015
pH, UA: 7

## 2013-04-13 MED ORDER — AMOXICILLIN 500 MG PO TABS: ORAL_TABLET | ORAL | Status: AC

## 2013-04-13 NOTE — Progress Notes (Signed)
  Subjective:    Patient ID: Linda [name], female    DOB: 08-06-1991, 22 y.o.   MRN: [id number]  Back Pain This is a new problem. The current episode started in the past 7 days. The problem occurs constantly. The problem has been gradually worsening since onset. The pain is present in the lumbar spine. The pain does not radiate. The pain is at a severity of 4/10. The pain is moderate. Associated symptoms include dysuria.      Review of Systems  Genitourinary: Positive for dysuria.  Musculoskeletal: Positive for back pain.       Objective:   Physical Exam  Alert no acute distress. Lungs clear. Heart regular in rhythm. No CVA tenderness. Some pain to percussion low back. Abdomen benign. Urinalysis 4-8 white blood cells per high-power field.      Assessment & Plan:  Impression UTI with secondary back discomfort. Plan a mocks 500 3 times a day. Symptomatic care discussed. Warning signs discussed. WSL

## 2013-04-15 DIAGNOSIS — F419 Anxiety disorder, unspecified: Secondary | ICD-10-CM | POA: Insufficient documentation

## 2013-04-15 DIAGNOSIS — J45909 Unspecified asthma, uncomplicated: Secondary | ICD-10-CM | POA: Insufficient documentation

## 2013-04-20 ENCOUNTER — Telehealth: Payer: Self-pay | Admitting: Family Medicine

## 2013-04-20 MED ORDER — CEFPROZIL 500 MG PO TABS: 500.0000 mg | ORAL_TABLET | Freq: Two times a day (BID) | ORAL | Status: DC

## 2013-04-20 NOTE — Telephone Encounter (Signed)
Pt feels like her symptoms for her UTI from last week have not subsided, does she need a recheck or just call in something stronger? Linda [name]

## 2013-04-20 NOTE — Telephone Encounter (Signed)
cefzil 500 bid for 7 d

## 2013-04-20 NOTE — Telephone Encounter (Signed)
Was seen on 04/13/13 and prescribed Amoxil 500 mg TID x 7 days.

## 2013-04-20 NOTE — Telephone Encounter (Signed)
RX sent into pharmacy. Patient was notified. 

## 2013-04-22 ENCOUNTER — Encounter (HOSPITAL_COMMUNITY): Payer: Self-pay | Admitting: *Deleted

## 2013-04-22 ENCOUNTER — Inpatient Hospital Stay (HOSPITAL_COMMUNITY)
Admission: AD | Admit: 2013-04-22 | Discharge: 2013-04-22 | Disposition: A | Discharge status: Home / Self Care | Payer: 59 | Source: Ambulatory Visit | Attending: Obstetrics and Gynecology | Admitting: Obstetrics and Gynecology

## 2013-04-22 DIAGNOSIS — N949 Unspecified condition associated with female genital organs and menstrual cycle: Secondary | ICD-10-CM | POA: Insufficient documentation

## 2013-04-22 DIAGNOSIS — O99893 Other specified diseases and conditions complicating puerperium: Secondary | ICD-10-CM | POA: Insufficient documentation

## 2013-04-22 DIAGNOSIS — R109 Unspecified abdominal pain: Secondary | ICD-10-CM | POA: Insufficient documentation

## 2013-04-22 DIAGNOSIS — M549 Dorsalgia, unspecified: Secondary | ICD-10-CM | POA: Insufficient documentation

## 2013-04-22 DIAGNOSIS — S39012A Strain of muscle, fascia and tendon of lower back, initial encounter: Secondary | ICD-10-CM

## 2013-04-22 LAB — URINALYSIS, ROUTINE W REFLEX MICROSCOPIC
Bilirubin Urine: NEGATIVE
Glucose, UA: NEGATIVE mg/dL
Hgb urine dipstick: NEGATIVE
Ketones, ur: NEGATIVE mg/dL
pH: 6 (ref 5.0–8.0)

## 2013-04-22 LAB — CBC
HCT: 34.7 % — ABNORMAL LOW (ref 36.0–46.0)
Hemoglobin: 11 g/dL — ABNORMAL LOW (ref 12.0–15.0)
MCH: 27 pg (ref 26.0–34.0)
MCV: 85.3 fL (ref 78.0–100.0)
RBC: 4.07 MIL/uL (ref 3.87–5.11)

## 2013-04-22 LAB — COMPREHENSIVE METABOLIC PANEL
BUN: 10 mg/dL (ref 6–23)
CO2: 28 mEq/L (ref 19–32)
Calcium: 9.6 mg/dL (ref 8.4–10.5)
Chloride: 103 mEq/L (ref 96–112)
Creatinine, Ser: 0.82 mg/dL (ref 0.50–1.10)
GFR calc Af Amer: >90 mL/min (ref >90)
GFR calc non Af Amer: >90 mL/min (ref >90)
Glucose, Bld: 93 mg/dL (ref 70–99)
Total Bilirubin: 0.4 mg/dL (ref 0.3–1.2)

## 2013-04-22 LAB — URINE MICROSCOPIC-ADD ON

## 2013-04-22 MED ORDER — CYCLOBENZAPRINE HCL 10 MG PO TABS
10.0000 mg | ORAL_TABLET | Freq: Once | ORAL | Status: AC
  Administered 2013-04-22: 10 mg via ORAL
  Filled 2013-04-22: qty 1

## 2013-04-22 MED ORDER — CYCLOBENZAPRINE HCL 10 MG PO TABS: 10.0000 mg | ORAL_TABLET | Freq: Two times a day (BID) | ORAL | Status: DC | PRN

## 2013-04-22 NOTE — MAU Provider Note (Signed)
History     CSN: [id number]  Arrival date and time: 04/22/13 5643   First Provider Initiated Contact with Patient 04/22/13 2138      Chief Complaint  Patient presents with  . Back Pain   HPI  Linda [name] is a  22 y.o. G1P0101 who is here S/P c-section. She states that about 3 weeks ago she started to have "severe" back pain. She was treated for a UIT, and did not feel any relief. So, she was prescribed a cephalosporin. She has taken 3 doses of that, but the pain continues. She states that she has a lot of lower abdominal pain as well as "strange" smelling vaginal discharge. She is rating the pain 7/10  Past Medical History  Diagnosis Date  . Asthma   . IBS (irritable bowel syndrome)   . Heart murmur   . Interstitial cystitis   . Anxiety     Past Surgical History  Procedure Laterality Date  . Upper gi endoscopy    . Tympanostomy tube placement    . No past surgeries    . Cesarean section N/A 03/20/2013    Procedure: CESAREAN SECTION;  Surgeon: [name], MD;  Location: [facility];  Service: Obstetrics;  Laterality: N/A;    Family History  Problem Relation Age of Onset  . Cancer Paternal Uncle   . COPD Maternal Grandfather   . Stroke Paternal Grandfather     History  Substance Use Topics  . Smoking status: Former Smoker -- 0.50 packs/day    Types: Cigarettes  . Smokeless tobacco: Never Used  . Alcohol Use: No    Allergies:  Allergies  Allergen Reactions  . Sulfa Antibiotics Diarrhea and Nausea Only    Prescriptions prior to admission  Medication Sig Dispense Refill  . albuterol (PROVENTIL HFA;VENTOLIN HFA) 108 (90 BASE) MCG/ACT inhaler Inhale 2 puffs into the lungs 2 (two) times daily as needed. For shortness of breath/asthma      . cefPROZIL (CEFZIL) 500 MG tablet Take 1 tablet (500 mg total) by mouth 2 (two) times daily.  14 tablet  0  . ibuprofen (ADVIL,MOTRIN) 600 MG tablet Take 1 tablet (600 mg total) by mouth every 6 (six) hours.  30 tablet  1   . LORazepam (ATIVAN) 0.5 MG tablet Take 1 tablet (0.5 mg total) by mouth every 8 (eight) hours.  10 tablet  0  . Pediatric Multiple Vit-C-FA (FLINSTONES GUMMIES OMEGA-3 DHA PO) Take 2 each by mouth daily.        Review of Systems  Constitutional: Negative for fever and chills.  Respiratory: Negative for shortness of breath.   Cardiovascular: Positive for palpitations.  Gastrointestinal: Positive for nausea and diarrhea. Negative for vomiting and constipation.  Genitourinary: Positive for dysuria. Negative for urgency, frequency and hematuria.  Musculoskeletal: Negative for myalgias.  Neurological: Negative for dizziness.   Physical Exam   Blood pressure 114/80, pulse 100, temperature 98.2 F (36.8 C), temperature source Oral, resp. rate 20, height 5' 5.5" (1.664 m), weight 95.255 kg (210 lb), SpO2 100.00%.  Physical Exam  Nursing note and vitals reviewed. Constitutional: She is oriented to person, place, and time. She appears well-developed and well-nourished. No distress.  Cardiovascular: Normal rate.   Respiratory: Effort normal.  GI: Soft. There is no tenderness.  Genitourinary:   No CVA tenderness External: no lesion Vagina: small amount of brown discharge seen Cervix: pink, smooth Uterus: normal size, normal involution, non-tender   Neurological: She is alert and oriented to person,  place, and time.  Skin: Skin is warm and dry.  Psychiatric: She has a normal mood and affect.    MAU Course  Procedures  Results for orders placed during the hospital encounter of 04/22/13 (from the past 24 hour(s))  URINALYSIS, ROUTINE W REFLEX MICROSCOPIC     Status: Abnormal   Collection Time    04/22/13  8:17 PM      Result Value Range   Color, Urine YELLOW  YELLOW   APPearance CLEAR  CLEAR   Specific Gravity, Urine 1.015  1.005 - 1.030   pH 6.0  5.0 - 8.0   Glucose, UA NEGATIVE  NEGATIVE mg/dL   Hgb urine dipstick NEGATIVE  NEGATIVE   Bilirubin Urine NEGATIVE  NEGATIVE    Ketones, ur NEGATIVE  NEGATIVE mg/dL   Protein, ur NEGATIVE  NEGATIVE mg/dL   Urobilinogen, UA 0.2  0.0 - 1.0 mg/dL   Nitrite NEGATIVE  NEGATIVE   Leukocytes, UA SMALL (*) NEGATIVE  URINE MICROSCOPIC-ADD ON     Status: None   Collection Time    04/22/13  8:17 PM      Result Value Range   Squamous Epithelial / LPF RARE  RARE   WBC, UA 0-2  <3 WBC/hpf   RBC / HPF 0-2  <3 RBC/hpf  CBC     Status: Abnormal   Collection Time    04/22/13  9:39 PM      Result Value Range   WBC 13.0 (*) 4.0 - 10.5 K/uL   RBC 4.07  3.87 - 5.11 MIL/uL   Hemoglobin 11.0 (*) 12.0 - 15.0 g/dL   HCT 16.1 (*) 09.6 - 04.5 %   MCV 85.3  78.0 - 100.0 fL   MCH 27.0  26.0 - 34.0 pg   MCHC 31.7  30.0 - 36.0 g/dL   RDW 40.9  81.1 - 91.4 %   Platelets 419 (*) 150 - 400 K/uL  COMPREHENSIVE METABOLIC PANEL     Status: None   Collection Time    04/22/13  9:39 PM      Result Value Range   Sodium 139  135 - 145 mEq/L   Potassium 3.6  3.5 - 5.1 mEq/L   Chloride 103  96 - 112 mEq/L   CO2 28  19 - 32 mEq/L   Glucose, Bld 93  70 - 99 mg/dL   BUN 10  6 - 23 mg/dL   Creatinine, Ser 7.82  0.50 - 1.10 mg/dL   Calcium 9.6  8.4 - 95.6 mg/dL   Total Protein 7.4  6.0 - 8.3 g/dL   Albumin 3.5  3.5 - 5.2 g/dL   AST 12  0 - 37 U/L   ALT 13  0 - 35 U/L   Alkaline Phosphatase 108  39 - 117 U/L   Total Bilirubin 0.4  0.3 - 1.2 mg/dL   GFR calc non Af Amer >90  >90 mL/min   GFR calc Af Amer >90  >90 mL/min    2215: Spoke with Dr. [name], patient may have Flexeril for back strain. Continue with antibiotics.  Assessment and Plan   1. Back strain, initial encounter    RX: flexeril 10mg  BID PRN #20 with 0 RF Discussed causes for back strain and comfort measures Continue abx as prescribed.   [name] 04/22/2013, 9:39 PM

## 2013-04-22 NOTE — MAU Note (Signed)
S/p C/S 03/20/2013, reports "extreme" back pain since about 3 weeks after C/S , was seen at [facility] and had kidney infection, took amoxicillin for 7 days and it still wasn't better, started on Cipro 2 days ago but pain is still really bad. States pain in lower back. Vaginal bleeding has stopped .

## 2013-05-10 ENCOUNTER — Ambulatory Visit (INDEPENDENT_AMBULATORY_CARE_PROVIDER_SITE_OTHER): Payer: 59 | Admitting: Nurse Practitioner

## 2013-05-10 DIAGNOSIS — F411 Generalized anxiety disorder: Secondary | ICD-10-CM

## 2013-05-10 DIAGNOSIS — F410 Panic disorder [episodic paroxysmal anxiety] without agoraphobia: Secondary | ICD-10-CM

## 2013-05-10 DIAGNOSIS — F419 Anxiety disorder, unspecified: Secondary | ICD-10-CM

## 2013-05-10 MED ORDER — BUSPIRONE HCL 10 MG PO TABS: ORAL_TABLET | ORAL | Status: DC

## 2013-05-10 MED ORDER — LORAZEPAM 0.5 MG PO TABS: ORAL_TABLET | ORAL | Status: DC

## 2013-05-11 ENCOUNTER — Encounter: Payer: Self-pay | Admitting: Nurse Practitioner

## 2013-05-11 NOTE — Progress Notes (Signed)
Subjective:  Presents for complaints of spells of difficulty breathing occurring one to 2 times per day since her C-section on 03/20/13. Had a spinal for her C-section. Was not intubated. Describes as a tight feeling in her chest where it's hard to get her breath and feels like she is "going to die". Can last several hours. Has tried her inhaler with no improvement. Slight improvement lorazepam. Unassociated with activity. Has been under a lot of stress since becoming a new mother. No fevers. Minimal cough. Is not breast-feeding. Was given low-dose lorazepam by her gynecologist for anxiety. Denies suicidal/homicidal thoughts or ideation. PMH includes chronic anxiety.  Objective:   There were no vitals taken for this visit. NAD. Alert, oriented. Lungs clear. Heart regular rate rhythm. No tachypnea. Normal color. Very pale. Her hemoglobin has steadily improved and is now up to 11.0. Lower extremities no edema.  Assessment:Panic attacks  Chronic anxiety  Plan: Meds ordered this encounter  Medications  . Cetirizine-Pseudoephedrine (ZYRTEC-D PO)    Sig: Take by mouth.  . medroxyPROGESTERone (PROVERA) 5 MG tablet    Sig: Take 5 mg by mouth daily.  . busPIRone (BUSPAR) 10 MG tablet    Sig: One twice a day  X 6 days then if needed and tolerated take 2 BID    Dispense:  60 tablet    Refill:  0    Order Specific Question:  Supervising Provider    Answer:  Merlyn Albert [[id number]]  . LORazepam (ATIVAN) 0.5 MG tablet    Sig: 1/2 to 1 po BID prn nerves    Dispense:  30 tablet    Refill:  0    1/2 to one tablet at a time, used sparingly    Order Specific Question:  Supervising Provider    Answer:  Adam Phenix [[id number]]   Recommend slow titration of BuSpar, can start with half a pill twice a day for few days then 1 twice a day for few days then 1-1/2 then 2. Stop medicine and call if any adverse effects. Reviewed potential side effects. If no problems with medications, recheck in 3-4 weeks. Recommend  using lorazepam very sparingly. Reviewed addiction potential. Patient defers mental health counseling at this time.

## 2013-05-11 NOTE — Assessment & Plan Note (Signed)
Recommend slow titration of BuSpar, can start with half a pill twice a day for few days then 1 twice a day for few days then 1-1/2 then 2. Stop medicine and call if any adverse effects. Reviewed potential side effects. If no problems with medications, recheck in 3-4 weeks. Recommend using lorazepam very sparingly. Reviewed addiction potential. Patient defers mental health counseling at this time.

## 2013-05-22 ENCOUNTER — Telehealth: Payer: Self-pay | Admitting: Family Medicine

## 2013-05-22 NOTE — Telephone Encounter (Signed)
Notified patient will require another ov with carolyn. Transferred to front desk to schedule appt.

## 2013-05-22 NOTE — Telephone Encounter (Signed)
Will require another ov with carolyn

## 2013-05-22 NOTE — Telephone Encounter (Signed)
Patient says that Buspar that [name] put her on is causing her to be anxious. She would like to try alternative meds. [facility]

## 2013-05-24 ENCOUNTER — Ambulatory Visit (INDEPENDENT_AMBULATORY_CARE_PROVIDER_SITE_OTHER): Payer: 59 | Admitting: Nurse Practitioner

## 2013-05-24 ENCOUNTER — Telehealth: Payer: Self-pay | Admitting: Family Medicine

## 2013-05-24 ENCOUNTER — Inpatient Hospital Stay (HOSPITAL_COMMUNITY)
Admission: AD | Admit: 2013-05-24 | Discharge: 2013-05-24 | Disposition: A | Discharge status: Home / Self Care | Payer: 59 | Source: Ambulatory Visit | Attending: Obstetrics and Gynecology | Admitting: Obstetrics and Gynecology

## 2013-05-24 ENCOUNTER — Encounter (HOSPITAL_COMMUNITY): Payer: Self-pay | Admitting: *Deleted

## 2013-05-24 ENCOUNTER — Telehealth: Payer: Self-pay | Admitting: Nurse Practitioner

## 2013-05-24 ENCOUNTER — Encounter: Payer: Self-pay | Admitting: Nurse Practitioner

## 2013-05-24 VITALS — BP 116/78 | HR 80 | Wt 206.8 lb

## 2013-05-24 DIAGNOSIS — F419 Anxiety disorder, unspecified: Secondary | ICD-10-CM

## 2013-05-24 DIAGNOSIS — F329 Major depressive disorder, single episode, unspecified: Secondary | ICD-10-CM

## 2013-05-24 DIAGNOSIS — F411 Generalized anxiety disorder: Secondary | ICD-10-CM

## 2013-05-24 DIAGNOSIS — F3289 Other specified depressive episodes: Secondary | ICD-10-CM | POA: Insufficient documentation

## 2013-05-24 DIAGNOSIS — O99345 Other mental disorders complicating the puerperium: Secondary | ICD-10-CM

## 2013-05-24 MED ORDER — CITALOPRAM HYDROBROMIDE 20 MG PO TABS: ORAL_TABLET | ORAL | Status: DC

## 2013-05-24 NOTE — Telephone Encounter (Signed)
error 

## 2013-05-24 NOTE — MAU Note (Signed)
Patient states feels "overwhelmed" and feels like "can't do this anymore" for about a week now. Denies feelings of self-harm or harm to others. Has no other complaints at this time. Patient states was taking zoloft and welbutrin prior to pregnancy off and on but has not taken them since she had been pregnant. Patient has flat affect. Observed crying at times.

## 2013-05-24 NOTE — MAU Provider Note (Signed)
History     CSN: [id number]  Arrival date and time: 05/24/13 1753   First Provider Initiated Contact with Patient 05/24/13 1817      No chief complaint on file.  HPI Linda [name] is 22 y.o. G1P0101 was at the hospital today to have her blood drawn, stories conflicting whether it was for her PCP to eval vitamin deficiency or for the baby.  Delivered 5/6 by Dr. Senaida [name] by C-Section for placental abruption.  Did well after her surgery.   Her son, Linda [name], was in NICU for 6 weeks.  She comes into MAU after begin upset and crying in the lab.  Has a history of anxiety and depression, dx at age 72.  Has taken medication for these dxs off and on since age 70.   She was seen by her PCP, Dr. Weyman Croon office, 2 weeks ago for anxiety and was give Rx for Buspar.   She went back in today because she thought the medication was making her more anxious.  Was given Rx for Celexa with instructions to begin tonight.  "I have always had anxiety but its gotten worse since the delivery".  "Suffered though pregnancy without medication".  She attributes the increase of anxiety to son's stay in NICU and "I worry and obsess over everything that could happen".  Husband works second shift, tired from work.  She is not getting very much sleep--5-7 interrupted hours of sleep.  Her Mother and Grandmother help out.  Episodes of crying are intermittent.  Difficulty getting out of bed sometimes.  " I feel like I haven't been depressed until the past few days but I have had the anxiety for a while".   Began first pack of  Micronor Sunday, feels it caused depressive sxs.  Took 3 pills and has not taken yesterdays or todays. Patient denies suicidal thought or plans.  Does feel like "I am going to end up in a crazy house because I obsess over him".  Has seen Dr. Lucianne [name] in [place] in the past but not since delivery.   Past Medical History  Diagnosis Date  . Asthma   . IBS (irritable bowel syndrome)   . Heart murmur   .  Interstitial cystitis   . Anxiety     Past Surgical History  Procedure Laterality Date  . Upper gi endoscopy    . Tympanostomy tube placement    . No past surgeries    . Cesarean section N/A 03/20/2013    Procedure: CESAREAN SECTION;  Surgeon: [name], MD;  Location: [facility];  Service: Obstetrics;  Laterality: N/A;    Family History  Problem Relation Age of Onset  . Cancer Paternal Uncle   . COPD Maternal Grandfather   . Stroke Paternal Grandfather     History  Substance Use Topics  . Smoking status: Former Smoker -- 0.50 packs/day    Types: Cigarettes  . Smokeless tobacco: Never Used  . Alcohol Use: No    Allergies:  Allergies  Allergen Reactions  . Sulfa Antibiotics Diarrhea and Nausea Only    Prescriptions prior to admission  Medication Sig Dispense Refill  . albuterol (PROVENTIL HFA;VENTOLIN HFA) 108 (90 BASE) MCG/ACT inhaler Inhale 2 puffs into the lungs 2 (two) times daily as needed. For shortness of breath/asthma      . cetirizine-pseudoephedrine (ZYRTEC-D) 5-120 MG per tablet Take 1 tablet by mouth at bedtime.      Marland Kitchen LORazepam (ATIVAN) 0.5 MG tablet Take 0.25-0.5 mg by mouth 2 (  two) times daily as needed for anxiety. 1/2 to 1 po BID prn nerves      . ondansetron (ZOFRAN) 8 MG tablet Take 8 mg by mouth every 8 (eight) hours as needed for nausea.       . Pediatric Multiple Vit-C-FA (FLINSTONES GUMMIES OMEGA-3 DHA PO) Take 2 each by mouth daily.      . [DISCONTINUED] LORazepam (ATIVAN) 0.5 MG tablet 1/2 to 1 po BID prn nerves  30 tablet  0  . citalopram (CELEXA) 20 MG tablet Take 10-20 mg by mouth at bedtime. 1/2 po q HS x 6 days; then one po q HS      . [DISCONTINUED] citalopram (CELEXA) 20 MG tablet 1/2 po q HS x 6 days; then one po q HS  30 tablet  0    Review of Systems  Psychiatric/Behavioral: Positive for depression. Negative for suicidal ideas. The patient is nervous/anxious.        Flat affect, tearful at times.   Grandmother in with she and her  infant.   Physical Exam   Blood pressure 125/86, pulse 81, temperature 98.1 F (36.7 C), temperature source Oral, resp. rate 18, height 5\' 5"  (1.651 m), weight 206 lb (93.441 kg), SpO2 98.00%, not currently breastfeeding.  Physical Exam  Constitutional: She is oriented to person, place, and time. She appears well-developed and well-nourished.  Neurological: She is alert and oriented to person, place, and time.  Psychiatric:  Flat affect, tearful at times.    MAU Course  Procedures  MDM 18:45  Reported HPI and MSE to Dr. Senaida [name].  She explained the patient called the office today stating her PCP needed labs drawn so she came here.  Dr. Senaida [name] is familiar with this patient.  Order given for ACT evaluation and to call her with their assessment. 20:25  Care turned over to H. Mathews Robinsons, CNM.  She will call Dr. Senaida [name] after ACT eval.   2124: ACT team has been to see the patient. She does not meet criteria for inpatient admission. She has contracted for safety. Dr. Senaida [name] updated with ACT team assessment. OK for dc home, and FU with PCP as needed/scheduled. Return to MAU with any worsening sx or SI/HI.   Assessment and Plan   1. Depression   2. Anxiety    Continue current treatment plan Return to MAU with any worsening symptoms or suicidal/homicidal ideations FU with PCP as planned  KEY,EVE M 05/24/2013, 6:20 PM   Linda [name]

## 2013-05-24 NOTE — MAU Note (Signed)
Plan of care discussed with the pt and her grandmother-pt is calm with a flat affect-states she is very afraid and nervous-emotional support given-PO fluids given to drink-grandmother of the pt is present and holding the baby

## 2013-05-24 NOTE — Telephone Encounter (Signed)
Patient is calling to see if she can have a refill of her Lorazepam wrote so she doesn't run out because she doesn't follow up with you until after she is out.

## 2013-05-24 NOTE — MAU Note (Signed)
ACT Team paged.

## 2013-05-24 NOTE — BH Assessment (Signed)
Assessment Note   Linda [name] is an 22 y.o. female presenting with increased anxiety.  Pt denies SI/HI, AVH and delusions at the time of the assessment.  Pt denies SA hx.  Pt presented to the ED for blood work for her son and began experiencing a panic attack.  Pt recently gave birth to her first child 2 months ago.  Pt is married x6 months and lives with her 52 month old sone and husband.  Pt denies prior inpatient admissions.  Pt endorses past opt hx 3 years ago with [facility].  Pt endorses severe anxiety and daily panic attacks.  Pt states "I just feel like I'm losing my mind, I don't know how to describe it."  Pt endorses increased anxiety since the birth of her son, increased tearfulness, insomnia, poor appetite and malaise.  Pt endorses completing ADL without incident.  Pt states she and her husband are having some marital strife due to financial problems and accusations of infidelity from members of the community.  Pt endorses past SIB via cutting in the 7th grade related to bullying.  Pt denies hx of physical, emotional and sexual abuse.  Pt endorses that her son has a B12 and vitamin deficiency requiring ongoing medical care which causes she and her husband stress.  Pt endorses a positive support system with her entire family including her husband.  Pt states her family lives nearby.  Pt was prescribed Celexa by her PCP today and has not started the medication.  Pt does not meet inpatient criteria at this time.  Pt presents with anxious mood and flat affect.  Pt states she is able to contract for safety.  Therapist conferred with Dr. [name], pt's OBGYN, on the phone who stated the pt is historically anxious and has a history of poor follow through with medication.  Dr. [name] was informed the pt denies SI/HI, AVH and delusions.  PPt was seen and medically cleared by Thressa Sheller, NP.  Pt signed No-Harm Contract and agreed to present to [facility] in [place], [place] tomorrow morning 8am.             Axis I: Generalized Anxiety Disorder Axis II: Deferred Axis III:  Past Medical History  Diagnosis Date  . Asthma   . IBS (irritable bowel syndrome)   . Heart murmur   . Interstitial cystitis   . Anxiety    Axis IV: economic problems, other psychosocial or environmental problems and problems related to social environment Axis V: 51-60 moderate symptoms  Past Medical History:  Past Medical History  Diagnosis Date  . Asthma   . IBS (irritable bowel syndrome)   . Heart murmur   . Interstitial cystitis   . Anxiety     Past Surgical History  Procedure Laterality Date  . Upper gi endoscopy    . Tympanostomy tube placement    . No past surgeries    . Cesarean section N/A 03/20/2013    Procedure: CESAREAN SECTION;  Surgeon: [name], MD;  Location: [facility];  Service: Obstetrics;  Laterality: N/A;  . Cesarean section      Family History:  Family History  Problem Relation Age of Onset  . Cancer Paternal Uncle   . COPD Maternal Grandfather   . Stroke Paternal Grandfather     Social History:  reports that she has quit smoking. Her smoking use included Cigarettes. She smoked 0.50 packs per day. She has never used smokeless tobacco. She reports that she does not  drink alcohol or use illicit drugs.  Additional Social History:     CIWA: CIWA-Ar BP: 125/86 mmHg Pulse Rate: 81 COWS:    Allergies:  Allergies  Allergen Reactions  . Sulfa Antibiotics Diarrhea and Nausea Only    Home Medications:  Medications Prior to Admission  Medication Sig Dispense Refill  . albuterol (PROVENTIL HFA;VENTOLIN HFA) 108 (90 BASE) MCG/ACT inhaler Inhale 2 puffs into the lungs 2 (two) times daily as needed. For shortness of breath/asthma      . busPIRone (BUSPAR) 10 MG tablet Take 10 mg by mouth 2 (two) times daily.      . cetirizine-pseudoephedrine (ZYRTEC-D) 5-120 MG per tablet Take 1 tablet by mouth at bedtime.      Marland Kitchen LORazepam (ATIVAN) 0.5 MG tablet Take 0.25-0.5 mg by  mouth 2 (two) times daily as needed for anxiety. 1/2 to 1 po BID prn nerves      . norethindrone-ethinyl estradiol-iron (MICROGESTIN FE,GILDESS FE,LOESTRIN FE) 1.5-30 MG-MCG tablet Take 1 tablet by mouth daily.      . ondansetron (ZOFRAN) 8 MG tablet Take 8 mg by mouth every 8 (eight) hours as needed for nausea.       . Pediatric Multiple Vit-C-FA (FLINSTONES GUMMIES OMEGA-3 DHA PO) Take 2 each by mouth daily.      . [DISCONTINUED] LORazepam (ATIVAN) 0.5 MG tablet 1/2 to 1 po BID prn nerves  30 tablet  0  . citalopram (CELEXA) 20 MG tablet Take 10-20 mg by mouth at bedtime. 1/2 po q HS x 6 days; then one po q HS      . [DISCONTINUED] citalopram (CELEXA) 20 MG tablet 1/2 po q HS x 6 days; then one po q HS  30 tablet  0    OB/GYN Status:  No LMP recorded.  General Assessment Data Location of Assessment: [facility] ACT Assessment: Yes Living Arrangements: Spouse/significant other Can pt return to current living arrangement?: Yes Admission Status: Voluntary Is patient capable of signing voluntary admission?: Yes Transfer from: Home Referral Source: Self/Family/Friend     Risk to self Suicidal Ideation: No Suicidal Intent: No Is patient at risk for suicide?: No Suicidal Plan?: No Access to Means: No What has been your use of drugs/alcohol within the last 12 months?: none endorsed Previous Attempts/Gestures: Yes How many times?: 1 Other Self Harm Risks: cutting behaviors in 7th grade, postpartum Triggers for Past Attempts: Other (Comment) (bullying at school) Intentional Self Injurious Behavior: None Family Suicide History: No Recent stressful life event(s): Conflict (Comment);Other (Comment) (problems in marriage, recent childbirth) Persecutory voices/beliefs?: No Depression: Yes Depression Symptoms: Despondent;Insomnia;Tearfulness;Isolating;Loss of interest in usual pleasures;Feeling worthless/self pity;Feeling angry/irritable Substance abuse history and/or treatment for substance  abuse?: No Suicide prevention information given to non-admitted patients: Yes  Risk to Others Homicidal Ideation: No Thoughts of Harm to Others: No Current Homicidal Intent: No Current Homicidal Plan: No Access to Homicidal Means: No Identified Victim: none History of harm to others?: No Assessment of Violence: None Noted Violent Behavior Description: none noted Does patient have access to weapons?: No Criminal Charges Pending?: No Does patient have a court date: No  Psychosis Hallucinations: None noted Delusions:  (Paranoia)  Mental Status Report Appear/Hygiene: Disheveled Eye Contact: Good Motor Activity: Freedom of movement;Unremarkable Speech: Logical/coherent Level of Consciousness: Alert Mood: Depressed;Anxious Affect: Depressed Anxiety Level: Panic Attacks Panic attack frequency: daily Most recent panic attack: today Thought Processes: Coherent;Relevant Judgement: Unimpaired Orientation: Person;Place;Time;Situation Obsessive Compulsive Thoughts/Behaviors: Severe  Cognitive Functioning Concentration: Normal Memory: Remote Intact;Recent Impaired IQ: Average  Insight: Good Impulse Control: Good Appetite: Poor Weight Loss: 8 Weight Gain: 0 Sleep: Decreased Total Hours of Sleep: 5 Vegetative Symptoms: None  ADLScreening [facility] Assessment Services) Patient's cognitive ability adequate to safely complete daily activities?: Yes Patient able to express need for assistance with ADLs?: Yes Independently performs ADLs?: Yes (appropriate for developmental age)  Abuse/Neglect [facility]) Physical Abuse: Denies Verbal Abuse: Denies Sexual Abuse: Denies  Prior Inpatient Therapy Prior Inpatient Therapy: No Prior Therapy Dates: none Prior Therapy Facilty/Provider(s): none Reason for Treatment: none  Prior Outpatient Therapy Prior Outpatient Therapy: Yes Prior Therapy Dates: 3 years ago Prior Therapy Facilty/Provider(s): [facility] Reason for Treatment:  anxiety  ADL Screening (condition at time of admission) Patient's cognitive ability adequate to safely complete daily activities?: Yes Is the patient deaf or have difficulty hearing?: No Does the patient have difficulty seeing, even when wearing glasses/contacts?: No Does the patient have difficulty concentrating, remembering, or making decisions?: No Patient able to express need for assistance with ADLs?: Yes Does the patient have difficulty dressing or bathing?: No Independently performs ADLs?: Yes (appropriate for developmental age) Does the patient have difficulty walking or climbing stairs?: No Weakness of Legs: None Weakness of Arms/Hands: None  Home Assistive Devices/Equipment Home Assistive Devices/Equipment: None  Therapy Consults (therapy consults require a physician order) PT Evaluation Needed: No OT Evalulation Needed: No SLP Evaluation Needed: No Abuse/Neglect Assessment (Assessment to be complete while patient is alone) Physical Abuse: Denies Verbal Abuse: Denies Sexual Abuse: Denies Exploitation of patient/patient's resources: Denies Self-Neglect: Denies Values / Beliefs Cultural Requests During Hospitalization: None Spiritual Requests During Hospitalization: None Consults Spiritual Care Consult Needed: No   Nutrition Screen- [facility] Adult/[facility]/AP Patient's home diet: Regular  Additional Information 1:1 In Past 12 Months?: No CIRT Risk: No Elopement Risk: No Does patient have medical clearance?: Yes     Disposition:  Disposition Initial Assessment Completed for this Encounter: Yes Disposition of Patient: Referred to Patient referred to: Outpatient clinic referral  On Site Evaluation by:   Reviewed with Physician:     Ena Dawley [facility] 05/24/2013 8:40 PM

## 2013-05-24 NOTE — MAU Note (Signed)
ACT Team paged again.

## 2013-05-25 ENCOUNTER — Encounter: Payer: Self-pay | Admitting: Nurse Practitioner

## 2013-05-25 ENCOUNTER — Other Ambulatory Visit: Payer: Self-pay | Admitting: Nurse Practitioner

## 2013-05-25 MED ORDER — LORAZEPAM 0.5 MG PO TABS: ORAL_TABLET | ORAL | Status: DC

## 2013-05-25 NOTE — Assessment & Plan Note (Signed)
Stop BuSpar. Start Celexa 20 mg a half tab by mouth daily x6 days then 1 by mouth daily. DC med and call if any significant adverse effects. Otherwise recheck in one month. Patient given information to set up mental health counseling.

## 2013-05-25 NOTE — Progress Notes (Signed)
Subjective:  Presents for recheck on her anxiety. Had to stop BuSpar due to increase anxiety symptoms. Also admits to some postpartum depression symptoms. Denies any suicidal or homicidal thoughts or ideation. Is interested in getting some counseling at this point.  Objective:   BP 116/78  Pulse 80  Wt 206 lb 12.8 oz (93.804 kg)  BMI 33.88 kg/m2 NAD. Alert, oriented. Calm affect. Lungs clear. Heart regular rate rhythm.  Assessment:Chronic anxiety  Post partum depression  Plan: Stop BuSpar. Start Celexa 20 mg a half tab by mouth daily x6 days then 1 by mouth daily. DC med and call if any significant adverse effects. Otherwise recheck in one month. Patient given information to set up mental health counseling.

## 2013-05-28 DIAGNOSIS — F53 Postpartum depression: Secondary | ICD-10-CM | POA: Insufficient documentation

## 2013-05-28 NOTE — Assessment & Plan Note (Signed)
Plan: Stop BuSpar. Start Celexa 20 mg a half tab by mouth daily x6 days then 1 by mouth daily. DC med and call if any significant adverse effects. Otherwise recheck in one month. Patient given information to set up mental health counseling.

## 2013-05-31 ENCOUNTER — Ambulatory Visit: Payer: 59 | Admitting: Nurse Practitioner

## 2013-06-20 ENCOUNTER — Telehealth: Payer: Self-pay | Admitting: Nurse Practitioner

## 2013-06-20 NOTE — Telephone Encounter (Signed)
Patient is having problems with her medication and she said it is important that she is seen tomorrow and it cannot wait until Friday. She is asking that you please call her first thing in the morning.

## 2013-06-20 NOTE — Telephone Encounter (Signed)
Please call patient and get more information.  Not sure which medication but probably Celexa.  Thanks.

## 2013-06-20 NOTE — Telephone Encounter (Signed)
Patient scheduled appt with you tomm - not sure if med is working .

## 2013-06-20 NOTE — Telephone Encounter (Signed)
Gave appt for thursday

## 2013-06-21 ENCOUNTER — Ambulatory Visit (INDEPENDENT_AMBULATORY_CARE_PROVIDER_SITE_OTHER): Payer: 59 | Admitting: Nurse Practitioner

## 2013-06-21 ENCOUNTER — Encounter: Payer: Self-pay | Admitting: Nurse Practitioner

## 2013-06-21 VITALS — BP 116/80 | Ht 65.5 in | Wt 204.8 lb

## 2013-06-21 DIAGNOSIS — J321 Chronic frontal sinusitis: Secondary | ICD-10-CM

## 2013-06-21 DIAGNOSIS — F411 Generalized anxiety disorder: Secondary | ICD-10-CM

## 2013-06-21 DIAGNOSIS — O99345 Other mental disorders complicating the puerperium: Secondary | ICD-10-CM

## 2013-06-21 DIAGNOSIS — F53 Postpartum depression: Secondary | ICD-10-CM

## 2013-06-21 DIAGNOSIS — F329 Major depressive disorder, single episode, unspecified: Secondary | ICD-10-CM

## 2013-06-21 DIAGNOSIS — F419 Anxiety disorder, unspecified: Secondary | ICD-10-CM

## 2013-06-21 MED ORDER — AMOXICILLIN 875 MG PO TABS: 875.0000 mg | ORAL_TABLET | Freq: Two times a day (BID) | ORAL | Status: DC

## 2013-06-21 MED ORDER — CITALOPRAM HYDROBROMIDE 20 MG PO TABS: ORAL_TABLET | ORAL | Status: DC

## 2013-06-22 ENCOUNTER — Ambulatory Visit: Payer: 59 | Admitting: Nurse Practitioner

## 2013-06-22 NOTE — Assessment & Plan Note (Signed)
Increase Celexa 20 mg one half tabs by mouth daily for at least 2 weeks, then if needed increase to 2 by mouth daily and call back to the office at that time. Continue counseling 

## 2013-06-22 NOTE — Progress Notes (Signed)
Subjective:   Presents for recheck. Celexa started working well initially, although doing better still having some rare panic attacks, had one yesterday lasting about 30 minutes. Has begun seeing a mental health counselor. Denies any suicidal or homicidal thoughts or ideation. Denies any sleep issues. Denies any adverse effects. The past 3 days patient has had sore throat and cough producing green mucus at times. No wheezing. No fever. Right frontal area headache. No ear pain.  Objective:   BP 116/80  Ht 5' 5.5" (1.664 m)  Wt 204 lb 12.8 oz (92.897 kg)  BMI 33.55 kg/m2  Breastfeeding? No NAD. Alert, oriented. TMs clear effusion, no erythema. Pharynx injected with PND noted. Neck supple with mild soft nontender adenopathy. Lungs clear. Heart regular rate rhythm.  Assessment:Frontal sinusitis  Chronic anxiety  Plan: Meds ordered this encounter  Medications  . citalopram (CELEXA) 20 MG tablet    Sig: 1 1/2 po qd    Dispense:  45 tablet    Refill:  5    Order Specific Question:  Supervising Provider    Answer:  Merlyn Albert [[id number]]  . amoxicillin (AMOXIL) 875 MG tablet    Sig: Take 1 tablet (875 mg total) by mouth 2 (two) times daily.    Dispense:  20 tablet    Refill:  0    Order Specific Question:  Supervising Provider    Answer:  Merlyn Albert [[id number]]   Increase Celexa 20 mg one half tabs by mouth daily for at least 2 weeks, then if needed increase to 2 by mouth daily and call back to the office at that time. Continue counseling. OTC meds as directed for congestion. Recheck in 3 months, call back sooner if any problems.

## 2013-06-22 NOTE — Assessment & Plan Note (Signed)
Increase Celexa 20 mg one half tabs by mouth daily for at least 2 weeks, then if needed increase to 2 by mouth daily and call back to the office at that time. Continue counseling

## 2013-06-26 ENCOUNTER — Ambulatory Visit: Payer: 59 | Admitting: Nurse Practitioner

## 2013-07-10 ENCOUNTER — Encounter: Payer: Self-pay | Admitting: Family Medicine

## 2013-07-10 ENCOUNTER — Ambulatory Visit (INDEPENDENT_AMBULATORY_CARE_PROVIDER_SITE_OTHER): Payer: 59 | Admitting: Family Medicine

## 2013-07-10 ENCOUNTER — Telehealth: Payer: Self-pay | Admitting: Family Medicine

## 2013-07-10 VITALS — BP 132/82 | Temp 97.6°F | Ht 65.75 in | Wt 207.0 lb

## 2013-07-10 DIAGNOSIS — J329 Chronic sinusitis, unspecified: Secondary | ICD-10-CM

## 2013-07-10 MED ORDER — BENZONATATE 100 MG PO CAPS: 100.0000 mg | ORAL_CAPSULE | Freq: Four times a day (QID) | ORAL | Status: DC | PRN

## 2013-07-10 MED ORDER — AMOXICILLIN 500 MG PO TABS: 500.0000 mg | ORAL_TABLET | Freq: Three times a day (TID) | ORAL | Status: DC

## 2013-07-10 MED ORDER — LEVOFLOXACIN 500 MG PO TABS: 500.0000 mg | ORAL_TABLET | Freq: Every day | ORAL | Status: DC

## 2013-07-10 NOTE — Telephone Encounter (Signed)
Sent in amox 500 tid ten days to [facility]. Patient was notified.

## 2013-07-10 NOTE — Telephone Encounter (Signed)
amox 500 tid ten d 

## 2013-07-10 NOTE — Progress Notes (Signed)
  Subjective:    Patient ID: Linda [name], female    DOB: 07-22-1991, 22 y.o.   MRN: [id number]  Cough This is a new problem. The current episode started in the past 7 days. Associated symptoms include ear congestion, nasal congestion, a sore throat and wheezing. Associated symptoms comments: nausea. She has tried steroid inhaler (antibiotic) for the symptoms. Her past medical history is significant for asthma.   Got better, now worse again. Gunky, colored mucous.  Not breast feeding. Ha pressure, a lot of coughint, quit smokeing   Review of Systems  HENT: Positive for sore throat.   Respiratory: Positive for cough and wheezing.    ROS otherwise negative     Objective:   Physical Exam  Alert mild malaise. Nasal congestion. Frontal tenderness. Pharynx slight erythema. Lungs occasional cough. Heart rare rhythm.      Assessment & Plan:  Impression rhinosinusitis/bronchitis. Plan Levaquin daily 10 days. Symptomatic care discussed. Tessalon Perles when necessary for cough.

## 2013-07-10 NOTE — Telephone Encounter (Signed)
Pt spoke with Pharmacist and he told her she needed to call us to get another script due to the fact that the Levaquin will interact with her celexa [facility], pharmacist stated she should get amoxicillin

## 2013-07-12 ENCOUNTER — Ambulatory Visit (INDEPENDENT_AMBULATORY_CARE_PROVIDER_SITE_OTHER): Payer: 59 | Admitting: Nurse Practitioner

## 2013-07-12 ENCOUNTER — Encounter: Payer: Self-pay | Admitting: Nurse Practitioner

## 2013-07-12 ENCOUNTER — Encounter: Payer: Self-pay | Admitting: Family Medicine

## 2013-07-12 VITALS — BP 108/60 | Temp 98.7°F | Ht 66.0 in | Wt 207.0 lb

## 2013-07-12 DIAGNOSIS — J069 Acute upper respiratory infection, unspecified: Secondary | ICD-10-CM

## 2013-07-12 MED ORDER — ALBUTEROL SULFATE HFA 108 (90 BASE) MCG/ACT IN AERS: 2.0000 | INHALATION_SPRAY | RESPIRATORY_TRACT | Status: DC | PRN

## 2013-07-12 MED ORDER — PREDNISONE 20 MG PO TABS: ORAL_TABLET | ORAL | Status: DC

## 2013-07-12 MED ORDER — CEFPROZIL 500 MG PO TABS: 500.0000 mg | ORAL_TABLET | Freq: Two times a day (BID) | ORAL | Status: DC

## 2013-07-14 ENCOUNTER — Encounter: Payer: Self-pay | Admitting: Nurse Practitioner

## 2013-07-14 NOTE — Progress Notes (Signed)
Subjective:  Presents complaints of cough and congestion that has been persistent over the past few weeks see previous notes. Was prescribed Levaquin on 8/26. Was concerned about potential interactions with her Celexa. Will switch back to another course of amoxicillin. Over the past few days has run a slight fever. Frequent cough producing green sputum. Slight wheezing over the past couple days, use her albuterol once a day yesterday. No headache. Slight sore throat. No ear pain. No vomiting diarrhea or abdominal pain.  Objective:   BP 108/60  Temp(Src) 98.7 F (37.1 C) (Oral)  Ht 5\' 6"  (1.676 m)  Wt 207 lb (93.895 kg)  BMI 33.43 kg/m2 NAD. Alert, oriented. TMs clear effusion, no erythema. Pharynx mildly injected with PND noted. Neck supple with mild soft nontender adenopathy. Lungs clear. Heart regular rate rhythm. Occasional harsh congested cough noted.  Assessment:Acute upper respiratory infections of unspecified site  mild reactive airways Plan: Meds ordered this encounter  Medications  . albuterol (PROVENTIL HFA;VENTOLIN HFA) 108 (90 BASE) MCG/ACT inhaler    Sig: Inhale 2 puffs into the lungs every 4 (four) hours as needed for wheezing. For shortness of breath/asthma    Dispense:  1 Inhaler    Refill:  5    Order Specific Question:  Supervising Provider    Answer:  Merlyn Albert [[id number]]  . cefPROZIL (CEFZIL) 500 MG tablet    Sig: Take 1 tablet (500 mg total) by mouth 2 (two) times daily.    Dispense:  20 tablet    Refill:  0    Order Specific Question:  Supervising Provider    Answer:  Merlyn Albert [[id number]]  . predniSONE (DELTASONE) 20 MG tablet    Sig: 3 po qd x 3 d then 2 po qd x 3 d then 1 po qd x 3 d    Dispense:  18 tablet    Refill:  0    Order Specific Question:  Supervising Provider    Answer:  Merlyn Albert [[id number]]   hold on prednisone prescription over the holiday weekend in case wheezing worsens. OTC meds as directed for cough. Call back next week if no  improvement, sooner if worse.

## 2013-07-19 ENCOUNTER — Encounter: Payer: Self-pay | Admitting: Nurse Practitioner

## 2013-07-19 ENCOUNTER — Ambulatory Visit (INDEPENDENT_AMBULATORY_CARE_PROVIDER_SITE_OTHER): Payer: 59 | Admitting: Nurse Practitioner

## 2013-07-19 ENCOUNTER — Telehealth: Payer: Self-pay | Admitting: Family Medicine

## 2013-07-19 ENCOUNTER — Encounter: Payer: Self-pay | Admitting: Family Medicine

## 2013-07-19 VITALS — BP 120/80 | Temp 98.3°F | Ht 65.0 in | Wt 209.6 lb

## 2013-07-19 DIAGNOSIS — J209 Acute bronchitis, unspecified: Secondary | ICD-10-CM

## 2013-07-19 DIAGNOSIS — J45901 Unspecified asthma with (acute) exacerbation: Secondary | ICD-10-CM

## 2013-07-19 MED ORDER — CEFPROZIL 500 MG PO TABS: 500.0000 mg | ORAL_TABLET | Freq: Two times a day (BID) | ORAL | Status: DC

## 2013-07-19 NOTE — Telephone Encounter (Signed)
Pt calling to state she filled this script cefPROZIL (CEFZIL) 500 MG tablet an has still 5 days left to take. Can we call in a refill or five days worth to [facility]

## 2013-07-19 NOTE — Telephone Encounter (Signed)
She should have about 3 days left which should be enough.  If she needs a refill (still has color to her mucus) tell her to call us back.

## 2013-07-19 NOTE — Telephone Encounter (Signed)
Patient stated she lost the rest of her Rx and needs 5 more days called in. Rx sent electronically to [facility]. Patient notified.

## 2013-07-20 ENCOUNTER — Encounter: Payer: Self-pay | Admitting: Nurse Practitioner

## 2013-07-20 NOTE — Assessment & Plan Note (Signed)
Assessment:Asthma with acute exacerbation  Acute bronchitis  Plan: Continue albuterol inhaler as directed when necessary. Strongly recommend patient start her prednisone taper as directed. Complete Cefzil as directed. Call back in 3 days if no improvement, call or go to ER sooner if worse.

## 2013-07-20 NOTE — Progress Notes (Signed)
Subjective:  Presents for recheck of her bronchitis. Cough is slightly better. No color to her mucus. No fever. Having some wheezing, using her albuterol inhaler twice yesterday, none today. Still has prednisone prescription home, has not started this.  Objective:   BP 120/80  Temp(Src) 98.3 F (36.8 C)  Ht 5\' 5"  (1.651 m)  Wt 209 lb 9.6 oz (95.074 kg)  BMI 34.88 kg/m2 NAD. Alert, oriented. Mild pallor, no cyanosis. TMs mild clear effusion, no erythema. Pharynx clear. Neck supple with mild soft nontender adenopathy. Lungs scattered faint expiratory wheezes noted, no tachypnea. Heart regular rate rhythm. Nurse attempted to give patient albuterol nebulizer treatment but had to stop at the beginning due to a panic attack.  Assessment:Asthma with acute exacerbation  Acute bronchitis  Plan: Continue albuterol inhaler as directed when necessary. Strongly recommend patient start her prednisone taper as directed. Complete Cefzil as directed. Call back in 3 days if no improvement, call or go to ER sooner if worse.

## 2013-07-24 ENCOUNTER — Telehealth: Payer: Self-pay | Admitting: Family Medicine

## 2013-07-24 NOTE — Telephone Encounter (Signed)
Transferred patient to front desk to schedule appointment.  

## 2013-07-24 NOTE — Telephone Encounter (Signed)
Pt thinks she has a possible ear infection and wants to just ask a few questions about it. Advised we can't diagnose over the phone.

## 2013-07-25 ENCOUNTER — Ambulatory Visit (INDEPENDENT_AMBULATORY_CARE_PROVIDER_SITE_OTHER): Payer: 59 | Admitting: Family Medicine

## 2013-07-25 ENCOUNTER — Encounter: Payer: Self-pay | Admitting: Family Medicine

## 2013-07-25 VITALS — BP 122/82 | Temp 98.2°F | Ht 65.0 in | Wt 215.6 lb

## 2013-07-25 DIAGNOSIS — J329 Chronic sinusitis, unspecified: Secondary | ICD-10-CM

## 2013-07-25 MED ORDER — AMOXICILLIN-POT CLAVULANATE 875-125 MG PO TABS: 1.0000 | ORAL_TABLET | Freq: Two times a day (BID) | ORAL | Status: AC

## 2013-07-25 NOTE — Progress Notes (Signed)
  Subjective:    Patient ID: Linda [name], female    DOB: 09/19/1991, 22 y.o.   MRN: [id number]  Otalgia  There is pain in both ears. This is a new problem. The current episode started in the past 7 days. The problem occurs every few hours. There has been no fever. Associated symptoms include coughing, headaches and a sore throat.    Patient also notes frontal headache. Comes and goes sharp in nature minimal radiation.    Review of Systems  HENT: Positive for ear pain and sore throat.   Respiratory: Positive for cough.   Neurological: Positive for headaches.   ROS otherwise negative    Objective:   Physical Exam Alert moderate malaise. Frontal tenderness. Pharynx erythematous neck supple. Lungs clear heart regular rate and rhythm.       Assessment & Plan:  Impression acute rhinosinusitis plan Augmentin twice a day 10 days. Symptomatic care discussed. WSL

## 2013-08-28 ENCOUNTER — Encounter: Payer: Self-pay | Admitting: Family Medicine

## 2013-08-28 ENCOUNTER — Ambulatory Visit (INDEPENDENT_AMBULATORY_CARE_PROVIDER_SITE_OTHER): Payer: 59 | Admitting: Family Medicine

## 2013-08-28 VITALS — BP 122/78 | Temp 97.7°F | Ht 65.0 in | Wt 216.0 lb

## 2013-08-28 DIAGNOSIS — J329 Chronic sinusitis, unspecified: Secondary | ICD-10-CM

## 2013-08-28 MED ORDER — AMOXICILLIN-POT CLAVULANATE 875-125 MG PO TABS: 1.0000 | ORAL_TABLET | Freq: Two times a day (BID) | ORAL | Status: AC

## 2013-08-28 NOTE — Progress Notes (Signed)
  Subjective:    Patient ID: Linda [name], female    DOB: [birth year], 22 y.o.   MRN: [id number]  Sinusitis This is a new problem. The current episode started in the past 7 days. There has been no fever. Associated symptoms include congestion, ear pain, headaches, sinus pressure and a sore throat. Past treatments include acetaminophen. The treatment provided mild relief.   No fever, non smoker  Does not feel like allergies  Ears have been hurting for days, and now pressure in the cheeks.     Review of Systems  HENT: Positive for congestion, ear pain, sinus pressure and sore throat.   Neurological: Positive for headaches.       Objective:   Physical Exam Alert moderate malaise. Frontal maxillary tenderness. Pharynx erythematous neck tender anterior nodes. Lungs clear. Heart regular in rhythm.       Assessment & Plan:  Impression acute sinusitis plan Augmentin twice a day 10 days. Symptomatic care discussed. WSL

## 2013-09-14 ENCOUNTER — Ambulatory Visit (INDEPENDENT_AMBULATORY_CARE_PROVIDER_SITE_OTHER): Payer: 59 | Admitting: Family Medicine

## 2013-09-14 ENCOUNTER — Encounter: Payer: Self-pay | Admitting: Family Medicine

## 2013-09-14 VITALS — BP 112/70 | Temp 97.9°F | Ht 66.0 in | Wt 218.0 lb

## 2013-09-14 DIAGNOSIS — J029 Acute pharyngitis, unspecified: Secondary | ICD-10-CM

## 2013-09-14 DIAGNOSIS — J069 Acute upper respiratory infection, unspecified: Secondary | ICD-10-CM

## 2013-09-14 MED ORDER — CEFPROZIL 500 MG PO TABS: 500.0000 mg | ORAL_TABLET | Freq: Two times a day (BID) | ORAL | Status: DC

## 2013-09-14 NOTE — Patient Instructions (Signed)
Fexofenadine 180 mg one daily ( generic Allegra)  Second choice : Loratadine 10 mg (generic Claritin)

## 2013-09-14 NOTE — Progress Notes (Signed)
  Subjective:    Patient ID: Linda [name], female    DOB: 07-Apr-1991, 22 y.o.   MRN: [id number]  Sore Throat  This is a new problem. The current episode started in the past 7 days. The problem has been gradually worsening. Neither side of throat is experiencing more pain than the other. There has been no fever. The pain is at a severity of 6/10. The pain is moderate. Associated symptoms include coughing, headaches, swollen glands and trouble swallowing. Associated symptoms comments: Watery eyes. She has tried nothing for the symptoms. The treatment provided no relief.   PMH frequent sinus illnesses.   Review of Systems  HENT: Positive for trouble swallowing.   Respiratory: Positive for cough.   Neurological: Positive for headaches.       Objective:   Physical Exam Lungs clear hearts regular pulse normal mild sinus tenderness throat is normal neck supple  No sign of strep     Assessment & Plan:  Viral URI with secondary sinus infection antibiotics as recommended get filled if persistent symptoms Cefzil twice a day 10 days hopefully over the next few days or runs along the course and go

## 2013-09-15 LAB — STREP A DNA PROBE: GASP: NEGATIVE

## 2013-09-24 ENCOUNTER — Ambulatory Visit: Payer: 59 | Admitting: Nurse Practitioner

## 2013-09-24 ENCOUNTER — Ambulatory Visit: Payer: 59 | Admitting: Family Medicine

## 2013-09-26 ENCOUNTER — Ambulatory Visit: Payer: 59

## 2013-09-27 ENCOUNTER — Encounter: Payer: Self-pay | Admitting: Family Medicine

## 2013-09-27 ENCOUNTER — Ambulatory Visit (INDEPENDENT_AMBULATORY_CARE_PROVIDER_SITE_OTHER): Payer: 59 | Admitting: Family Medicine

## 2013-09-27 VITALS — BP 100/80 | Temp 97.9°F | Ht 66.0 in | Wt 220.2 lb

## 2013-09-27 DIAGNOSIS — R350 Frequency of micturition: Secondary | ICD-10-CM

## 2013-09-27 DIAGNOSIS — B373 Candidiasis of vulva and vagina: Secondary | ICD-10-CM

## 2013-09-27 DIAGNOSIS — Z23 Encounter for immunization: Secondary | ICD-10-CM

## 2013-09-27 LAB — POCT URINALYSIS DIPSTICK
Blood, UA: 250
Protein, UA: 30
pH, UA: 5

## 2013-09-27 MED ORDER — FLUCONAZOLE 150 MG PO TABS: ORAL_TABLET | ORAL | Status: DC

## 2013-09-27 NOTE — Progress Notes (Signed)
  Subjective:    Patient ID: Linda [name], female    DOB: January 06, 1991, 22 y.o.   MRN: [id number]  HPI Patient has been experiencing vaginal itching and slight odor. Patient has also been experiencing frequent urination. It has been present for about 1 week now. Has used monistat with no relief. Patient is currently on her cycle.   frequncy and burning, with a slight odor to the urine  Notes itching, calmed down but not 100 per cent  Cycles medium,  Notes increased urination    Review of Systems No fever no chills no back pain recent antibiotics no change in bowel habits ROS otherwise negative    Objective:   Physical Exam  Alert vitals stable. HEENT normal. Lungs clear. Heart rare rhythm. No CVA tenderness. Abdomen benign.  Urinalysis positive for yeast. Negative for white blood cells or bacteria.      Assessment & Plan:  Impression likely yeast urethritis/vaginitis discussed plan Diflucan 1 tablet several days apart. Symptomatic care discussed. WSL

## 2013-10-16 ENCOUNTER — Ambulatory Visit (INDEPENDENT_AMBULATORY_CARE_PROVIDER_SITE_OTHER): Payer: 59 | Admitting: Family Medicine

## 2013-10-16 ENCOUNTER — Encounter: Payer: Self-pay | Admitting: Family Medicine

## 2013-10-16 VITALS — BP 128/88 | Temp 98.0°F | Ht 66.0 in | Wt 222.6 lb

## 2013-10-16 DIAGNOSIS — H6693 Otitis media, unspecified, bilateral: Secondary | ICD-10-CM

## 2013-10-16 DIAGNOSIS — H6690 Otitis media, unspecified, unspecified ear: Secondary | ICD-10-CM

## 2013-10-16 NOTE — Progress Notes (Signed)
   Subjective:    Patient ID: Linda [name], female    DOB: 1991-05-02, 22 y.o.   MRN: [id number]  HPI Patient is here today for a double ear infection.  She went to urgent care on Nov 24th. They prescribed her clarithromycin (which she is still taking) and Norel.   Notes pain in both ear. Significant depression near. Pain at times. Popping at times.  Headache frontal in nature comes and goes.  Low-grade fever.  Review of Systems No vomiting no diarrhea no rash no weight loss no weight gain somewhat diminished energy ROS otherwise negative    Objective:   Physical Exam  alert hydration good frontal maxillary tenderness evident. Pharynx slight erythema neck supple. TMs bilateral effusion. Lungs clear heart regular in rhythm       Assessment & Plan:  Impression bilateral otitis media. On further history patient has been mistakenly taking clarithromycin only once per day. She will double up to 2 daily. If this does not help prescription written. Plan Omnicef 300 twice a day if pain persists. Symptomatic care discussed. WSL

## 2013-10-17 ENCOUNTER — Telehealth: Payer: Self-pay | Admitting: Family Medicine

## 2013-10-17 NOTE — Telephone Encounter (Signed)
Patient requesting refill of her Norel  [facility] send to [facility] [place]  Please call pt when done [phone]

## 2013-10-18 NOTE — Telephone Encounter (Signed)
Need paper chart please.  

## 2013-10-18 NOTE — Telephone Encounter (Signed)
Chart sent back °

## 2013-10-18 NOTE — Telephone Encounter (Signed)
[facility] to clarify med. Not on medlist.

## 2013-10-18 NOTE — Telephone Encounter (Signed)
Linda Fail do you have chart? Have we clarified medication? [name]

## 2013-10-23 NOTE — Telephone Encounter (Signed)
Patient wants a prescription decongestant called in for sinus congestion

## 2013-10-23 NOTE — Telephone Encounter (Signed)
Patient notified

## 2013-10-23 NOTE — Telephone Encounter (Signed)
There are no prescription decongestants that i use--use otc sudafed

## 2013-10-29 ENCOUNTER — Encounter: Payer: Self-pay | Admitting: Family Medicine

## 2013-10-29 ENCOUNTER — Ambulatory Visit (INDEPENDENT_AMBULATORY_CARE_PROVIDER_SITE_OTHER): Payer: 59 | Admitting: Family Medicine

## 2013-10-29 VITALS — BP 110/62 | Temp 98.2°F | Ht 66.0 in | Wt 224.0 lb

## 2013-10-29 DIAGNOSIS — A088 Other specified intestinal infections: Secondary | ICD-10-CM

## 2013-10-29 DIAGNOSIS — A084 Viral intestinal infection, unspecified: Secondary | ICD-10-CM

## 2013-10-29 DIAGNOSIS — R112 Nausea with vomiting, unspecified: Secondary | ICD-10-CM

## 2013-10-29 NOTE — Patient Instructions (Signed)
Use the zofran as needed for nausea and vomiting  Tylenol as needed

## 2013-10-29 NOTE — Progress Notes (Signed)
   Subjective:    Patient ID: Linda [name], female    DOB: 09-20-1991, 22 y.o.   MRN: [id number]  Emesis  This is a new problem. The current episode started in the past 7 days. The problem occurs intermittently. The problem has been unchanged. The emesis has an appearance of stomach contents. The maximum temperature recorded prior to her arrival was 100 - 100.9 F. She has tried nothing for the symptoms. The treatment provided no relief.   Started around three days ago, started right off with nausea and dizy  Felt bad middle of the night and vomited,  yest during day, better, then threw up, feeling bubbly and queazzy  Appetite hungry but can't eat, no sig diarrhea, feels a little achey, not the usual cramping   A little cough and cong  Review of Systems  Gastrointestinal: Positive for vomiting.   minimal cough no headache no weakness decent appetite     Objective:   Physical Exam Alert no apparent distress. HEENT normal. Lungs clear. Heart rare in rhythm abdomen hyperactive bowel sounds no rebound no guarding this diffuse very minimal tenderness.  Results for orders placed in visit on 10/29/13  POCT URINE PREGNANCY      Result Value Range   Preg Test, Ur Negative          Assessment & Plan:  Impression viral gastroenteritis discussed. Negative pregnancy test per patient request. plan Zofran when necessary for nausea. Symptomatic care discussed. Warning signs discussed. WSL

## 2013-12-13 ENCOUNTER — Telehealth: Payer: Self-pay | Admitting: Family Medicine

## 2013-12-13 NOTE — Telephone Encounter (Signed)
Last office visit 10/29/13 

## 2013-12-13 NOTE — Telephone Encounter (Signed)
Patient needs refill on Ativan 0.5 mg no refills left completely out. [facility]

## 2013-12-14 ENCOUNTER — Encounter: Payer: Self-pay | Admitting: Family Medicine

## 2013-12-14 MED ORDER — LORAZEPAM 0.5 MG PO TABS: ORAL_TABLET | ORAL | Status: DC

## 2013-12-14 NOTE — Telephone Encounter (Signed)
Last seen 05/25/13

## 2013-12-14 NOTE — Addendum Note (Signed)
Addended by: Metro KungICHARDS, Jessamy Torosyan M on: 12/14/2013 04:44 PM   Modules accepted: Orders

## 2013-12-14 NOTE — Telephone Encounter (Signed)
Med faxed to [facility] pt. Notified

## 2013-12-14 NOTE — Telephone Encounter (Signed)
Patient needs refill on Ativan 30 mg.

## 2013-12-14 NOTE — Telephone Encounter (Signed)
Ok times one no ref 

## 2013-12-14 NOTE — Telephone Encounter (Signed)
Patient chart is under review she called wanting refill on Ativan 30 mg cant get it to go to the doctor for refill review.

## 2014-01-02 ENCOUNTER — Telehealth: Payer: Self-pay | Admitting: Family Medicine

## 2014-01-02 ENCOUNTER — Other Ambulatory Visit: Payer: Self-pay | Admitting: Nurse Practitioner

## 2014-01-02 MED ORDER — CITALOPRAM HYDROBROMIDE 20 MG PO TABS: ORAL_TABLET | ORAL | Status: DC

## 2014-01-02 NOTE — Telephone Encounter (Signed)
Patient needs Rx for Celexa. She misplaced her Rx that she was on so she did not get to take it last night. Needs refill ASAP.    [facility]

## 2014-01-07 ENCOUNTER — Encounter: Payer: Self-pay | Admitting: Family Medicine

## 2014-01-07 ENCOUNTER — Ambulatory Visit (INDEPENDENT_AMBULATORY_CARE_PROVIDER_SITE_OTHER): Payer: 59 | Admitting: Family Medicine

## 2014-01-07 VITALS — BP 110/70 | Temp 98.0°F | Ht 65.0 in | Wt 231.0 lb

## 2014-01-07 DIAGNOSIS — J329 Chronic sinusitis, unspecified: Secondary | ICD-10-CM

## 2014-01-07 DIAGNOSIS — J029 Acute pharyngitis, unspecified: Secondary | ICD-10-CM

## 2014-01-07 DIAGNOSIS — J310 Chronic rhinitis: Secondary | ICD-10-CM

## 2014-01-07 LAB — POCT RAPID STREP A (OFFICE): RAPID STREP A SCREEN: NEGATIVE

## 2014-01-07 MED ORDER — AMOXICILLIN-POT CLAVULANATE 875-125 MG PO TABS: 1.0000 | ORAL_TABLET | Freq: Two times a day (BID) | ORAL | Status: DC

## 2014-01-07 MED ORDER — AMOXICILLIN 500 MG PO CAPS: 500.0000 mg | ORAL_CAPSULE | Freq: Three times a day (TID) | ORAL | Status: DC

## 2014-01-07 NOTE — Progress Notes (Signed)
   Subjective:    Patient ID: Linda [name], female    DOB: 07-Oct-1991, 23 y.o.   MRN: [id number]  Cough This is a new problem. The current episode started in the past 7 days. Associated symptoms include shortness of breath. Associated symptoms comments: Chest congestion. Treatments tried: allegra.   Bad sneezing and prod cough  Diminished energy  No fever  Swallow painful  No vom no diarrhea  Smokes these days, no recent inhaler use  Patient wonders if this may be strep throat. Review of Systems  Respiratory: Positive for cough and shortness of breath.    No vomiting no diarrhea no rash ROS otherwise negative    Objective:   Physical Exam  Alert moderate malaise. Vital stable. Pharynx slight erythema. Neck supple. Lungs clear. Heart regular in rhythm. Moderate nasal congestion.  Results for orders placed in visit on 01/07/14  POCT RAPID STREP A (OFFICE)      Result Value Ref Range   Rapid Strep A Screen Negative  Negative         Assessment & Plan:  Impression acute pharyngitis with rhinosinusitis component. Plan Augmentin twice a day 10 days. Symptomatic care discussed. Warning signs discussed. WSL

## 2014-01-08 LAB — STREP A DNA PROBE: GASP: NEGATIVE

## 2014-01-28 ENCOUNTER — Encounter: Payer: Self-pay | Admitting: Nurse Practitioner

## 2014-01-28 ENCOUNTER — Ambulatory Visit (INDEPENDENT_AMBULATORY_CARE_PROVIDER_SITE_OTHER): Payer: 59 | Admitting: Nurse Practitioner

## 2014-01-28 VITALS — BP 118/78 | Temp 98.2°F | Ht 65.0 in | Wt 228.0 lb

## 2014-01-28 DIAGNOSIS — R300 Dysuria: Secondary | ICD-10-CM

## 2014-01-28 DIAGNOSIS — A088 Other specified intestinal infections: Secondary | ICD-10-CM

## 2014-01-28 DIAGNOSIS — N938 Other specified abnormal uterine and vaginal bleeding: Secondary | ICD-10-CM

## 2014-01-28 DIAGNOSIS — A084 Viral intestinal infection, unspecified: Secondary | ICD-10-CM

## 2014-01-28 DIAGNOSIS — N949 Unspecified condition associated with female genital organs and menstrual cycle: Secondary | ICD-10-CM

## 2014-01-28 LAB — POCT URINALYSIS DIPSTICK
Blood, UA: 250
SPEC GRAV UA: 1.02
pH, UA: 5

## 2014-01-28 LAB — POCT URINE PREGNANCY: Preg Test, Ur: NEGATIVE

## 2014-01-31 ENCOUNTER — Encounter: Payer: Self-pay | Admitting: Nurse Practitioner

## 2014-01-31 LAB — POCT UA - MICROSCOPIC ONLY
Bacteria, U Microscopic: NEGATIVE
RBC, urine, microscopic: POSITIVE

## 2014-01-31 NOTE — Progress Notes (Signed)
Subjective:  Presents with complaints of vomiting and diarrhea that occurred over the weekend. None today. Low-grade fever last night. Body. Low back pain. No CVA or flank tenderness. No urinary frequency. Some dysuria off-and-on for the past 2 weeks. Urinary pressure. No history of recent UTI. Same sexual partner. No vaginal discharge. Having heavy menstrual cycle once a month lasting about 7 days. Has had 2 negative pregnancy test recently. Has had medium menstrual flow for the past week, currently on her cycle.  Objective:   BP 118/78  Temp(Src) 98.2 F (36.8 C)  Ht 5\' 5"  (1.651 m)  Wt 228 lb (103.42 kg)  BMI 37.94 kg/m2 NAD. Alert, oriented. Lungs clear. Heart regular rate rhythm. Abdomen soft nondistended with minimal epigastric area tenderness, no pelvic area tenderness noted. No CVA tenderness. Urine microscopic RBCs noted associated with menses, otherwise clear. Urine hCG negative.  Assessment:Viral gastroenteritis  Dysuria - Plan: POCT urinalysis dipstick, POCT UA - Microscopic Only  DUB (dysfunctional uterine bleeding) - Plan: POCT urine pregnancy  Plan: Discussed options to help her menorrhagia and provide contraceptives. Patient plans to have another child within the next few years. Defers contraceptives today but will get back with us if she decides to start them. Continue clear liquids, gradually resume regular diet. Call back if symptoms persist.

## 2014-02-04 ENCOUNTER — Encounter: Payer: Self-pay | Admitting: Nurse Practitioner

## 2014-02-04 ENCOUNTER — Ambulatory Visit (INDEPENDENT_AMBULATORY_CARE_PROVIDER_SITE_OTHER): Payer: 59 | Admitting: Nurse Practitioner

## 2014-02-04 VITALS — BP 120/70 | Ht 65.0 in | Wt 231.1 lb

## 2014-02-04 DIAGNOSIS — Z3009 Encounter for other general counseling and advice on contraception: Secondary | ICD-10-CM

## 2014-02-04 MED ORDER — ETONOGESTREL-ETHINYL ESTRADIOL 0.12-0.015 MG/24HR VA RING: VAGINAL_RING | VAGINAL | Status: DC

## 2014-02-04 MED ORDER — PHENTERMINE HCL 37.5 MG PO TABS: 37.5000 mg | ORAL_TABLET | Freq: Every day | ORAL | Status: DC

## 2014-02-07 ENCOUNTER — Encounter: Payer: Self-pay | Admitting: Nurse Practitioner

## 2014-02-07 NOTE — Progress Notes (Signed)
Subjective:  Presents to discuss her birth control. Plan to conceive began in one to 2 years. Same sexual partner. No vaginal discharge or pelvic pain. Also complaining about excessive weight gain. 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 Is planning on taking a second job in the near future. Diet very poor. Eats a lot of fast food. [facility] paternal aunt and paternal uncle have a history of thyroid disease. Would like to talk weight loss medicine.about  Objective:   BP 120/70  Ht 5\' 5"  (1.651 m)  Wt 231 lb 2 oz (104.838 kg)  BMI 38.46 kg/m2 NAD. Alert, oriented. Lungs clear. Heart regular rate rhythm.  Assessment:  Problem List Items Addressed This Visit     Other   Morbid obesity   Relevant Medications      phentermine (ADIPEX-P) 37.5 MG tablet    Other Visit Diagnoses   Other general counseling and advice for contraceptive management    -  Primary       Plan: Discussed options regarding birth control. Patient wishes to start NuvaRing. Discussed concerns and questions.  Meds ordered this encounter  Medications  . fexofenadine (ALLEGRA) 180 MG tablet    Sig: Take 180 mg by mouth daily.  . phentermine (ADIPEX-P) 37.5 MG tablet    Sig: Take 1 tablet (37.5 mg total) by mouth daily before breakfast.    Dispense:  30 tablet    Refill:  0    Order Specific Question:  Supervising Provider    Answer:  Merlyn AlbertLUKING, WILLIAM S [[id number]]  . etonogestrel-ethinyl estradiol (NUVARING) 0.12-0.015 MG/24HR vaginal ring    Sig: Insert one (1) ring vaginally and leave in place for three (3) weeks, then remove for one (1) week.    Dispense:  1 each    Refill:  11    Order Specific Question:  Supervising Provider    Answer:  Merlyn AlbertLUKING, WILLIAM S [[id number]]   Trial of phentermine, reviewed potential adverse effects. DC med and call if any problems. Encourage regular activity and healthy diet. Return in about 1 month (around 03/07/2014).

## 2014-02-19 ENCOUNTER — Ambulatory Visit (INDEPENDENT_AMBULATORY_CARE_PROVIDER_SITE_OTHER): Payer: 59 | Admitting: Family Medicine

## 2014-02-19 ENCOUNTER — Other Ambulatory Visit: Payer: Self-pay | Admitting: Family Medicine

## 2014-02-19 ENCOUNTER — Encounter: Payer: Self-pay | Admitting: Family Medicine

## 2014-02-19 VITALS — BP 106/68 | Temp 98.5°F | Ht 65.0 in | Wt 230.0 lb

## 2014-02-19 DIAGNOSIS — J329 Chronic sinusitis, unspecified: Secondary | ICD-10-CM

## 2014-02-19 DIAGNOSIS — J310 Chronic rhinitis: Secondary | ICD-10-CM

## 2014-02-19 MED ORDER — FLUTICASONE PROPIONATE 50 MCG/ACT NA SUSP: 2.0000 | Freq: Every day | NASAL | Status: DC

## 2014-02-19 MED ORDER — CEFDINIR 300 MG PO CAPS: 300.0000 mg | ORAL_CAPSULE | Freq: Two times a day (BID) | ORAL | Status: DC

## 2014-02-19 NOTE — Progress Notes (Signed)
   Subjective:    Patient ID: Linda [name], female    DOB: December 13, 1990, 23 y.o.   MRN: [id number]  Cough This is a new problem. The current episode started in the past 7 days. Associated symptoms include ear congestion and nasal congestion. Treatments tried: allergy meds.    Throat started hurting  Then tickle  thenky productive   No fever  No sickness before Bad allergies  Takes allegra prn Headache and pressure in the ears      Review of Systems  Respiratory: Positive for cough.    no vomiting no diarrhea no high fever ROS otherwise negative     Objective:   Physical Exam  Alert mild malaise HET moderate his congestion pharynx normal neck supple. Lungs clear frontal tenderness heart regular in rhythm.      Assessment & Plan:  Impression 1 allergic rhinitis with rhinosinusitis plan Omnicef twice a day 10 days. Add Flonase 2 sprays each nostril daily. Maintain Allegra or similar symptomatic care discussed recheck if persists. WSL

## 2014-03-22 ENCOUNTER — Ambulatory Visit: Payer: 59 | Admitting: Nurse Practitioner

## 2014-04-09 ENCOUNTER — Other Ambulatory Visit: Payer: Self-pay | Admitting: Nurse Practitioner

## 2014-04-16 ENCOUNTER — Ambulatory Visit (INDEPENDENT_AMBULATORY_CARE_PROVIDER_SITE_OTHER): Payer: 59 | Admitting: Family Medicine

## 2014-04-16 ENCOUNTER — Encounter: Payer: Self-pay | Admitting: Family Medicine

## 2014-04-16 VITALS — BP 118/60 | Ht 66.0 in | Wt 241.0 lb

## 2014-04-16 DIAGNOSIS — R635 Abnormal weight gain: Secondary | ICD-10-CM

## 2014-04-16 DIAGNOSIS — R300 Dysuria: Secondary | ICD-10-CM

## 2014-04-16 LAB — POCT URINALYSIS DIPSTICK
PH UA: 6
SPEC GRAV UA: 1.01

## 2014-04-16 MED ORDER — PHENTERMINE HCL 37.5 MG PO TABS: 37.5000 mg | ORAL_TABLET | Freq: Every day | ORAL | Status: DC

## 2014-04-16 MED ORDER — CEFPROZIL 500 MG PO TABS: 500.0000 mg | ORAL_TABLET | Freq: Two times a day (BID) | ORAL | Status: DC

## 2014-04-16 NOTE — Progress Notes (Signed)
   Subjective:    Patient ID: Linda [name], female    DOB: 1991-05-15, 23 y.o.   MRN: [id number]  Dysuria  This is a new problem. Episode onset: 2 days ago. There has been no fever. Associated symptoms include flank pain, nausea and urgency. Associated symptoms comments: abd pain. Treatments tried: azo, tylenol.   Pt lost script for phentermine. Would like to know if she can get another script.    Review of Systems  Gastrointestinal: Positive for nausea.  Genitourinary: Positive for dysuria, urgency and flank pain.   no vomiting diarrhea no cough     Objective:   Physical Exam Lungs clear heart regular flanks nontender abdomen soft no guarding or rebound  Urinalysis occasional WBC     Assessment & Plan:  #1 weight loss. Patient lost prescription reassure her phentermine 37.5 mg, #30, 1 every morning, 2 refills, followup in the fall  #2 dysuria/UTI-Cefzil 500 twice a day for the next 5 days. Followup if high fevers flank pain or worse  #3 patient concerned about weight gain she relates that she is going to do her best to watch her diet. She is going to cutback Celexa to one tablet daily she will try to stay physically active

## 2014-04-17 ENCOUNTER — Encounter: Payer: Self-pay | Admitting: Family Medicine

## 2014-05-09 ENCOUNTER — Other Ambulatory Visit: Payer: Self-pay | Admitting: Family Medicine

## 2014-05-10 NOTE — Telephone Encounter (Signed)
Ok plus 2 ref 

## 2014-05-14 ENCOUNTER — Telehealth: Payer: Self-pay | Admitting: *Deleted

## 2014-05-14 MED ORDER — LORAZEPAM 0.5 MG PO TABS: ORAL_TABLET | ORAL | Status: DC

## 2014-05-14 NOTE — Telephone Encounter (Signed)
May refill this and 2 additional refills 

## 2014-05-14 NOTE — Telephone Encounter (Signed)
Needs refill on [facility]azepam [facility] [place] today if possible. Patient has requested this med twice from pharm. Last seen 04/16/14.

## 2014-05-14 NOTE — Telephone Encounter (Signed)
Left message on voicemail notifying patient that RX will be faxed to pharmacy today.

## 2014-06-14 ENCOUNTER — Encounter: Payer: Self-pay | Admitting: Nurse Practitioner

## 2014-06-14 ENCOUNTER — Ambulatory Visit (INDEPENDENT_AMBULATORY_CARE_PROVIDER_SITE_OTHER): Payer: 59 | Admitting: Nurse Practitioner

## 2014-06-14 VITALS — BP 112/68 | Temp 98.0°F | Ht 65.0 in | Wt 238.0 lb

## 2014-06-14 DIAGNOSIS — A499 Bacterial infection, unspecified: Secondary | ICD-10-CM

## 2014-06-14 DIAGNOSIS — R5381 Other malaise: Secondary | ICD-10-CM

## 2014-06-14 DIAGNOSIS — B9689 Other specified bacterial agents as the cause of diseases classified elsewhere: Secondary | ICD-10-CM

## 2014-06-14 DIAGNOSIS — N760 Acute vaginitis: Secondary | ICD-10-CM

## 2014-06-14 DIAGNOSIS — R300 Dysuria: Secondary | ICD-10-CM

## 2014-06-14 DIAGNOSIS — N3010 Interstitial cystitis (chronic) without hematuria: Secondary | ICD-10-CM | POA: Insufficient documentation

## 2014-06-14 DIAGNOSIS — R5383 Other fatigue: Secondary | ICD-10-CM

## 2014-06-14 DIAGNOSIS — F988 Other specified behavioral and emotional disorders with onset usually occurring in childhood and adolescence: Secondary | ICD-10-CM

## 2014-06-14 LAB — POCT URINALYSIS DIPSTICK
SPEC GRAV UA: 1.02
pH, UA: 6

## 2014-06-14 LAB — POCT WET PREP WITH KOH
Epithelial Wet Prep HPF POC: POSITIVE
KOH Prep POC: NEGATIVE
RBC WET PREP PER HPF POC: NEGATIVE
TRICHOMONAS UA: NEGATIVE
WBC Wet Prep HPF POC: NEGATIVE
pH: 5

## 2014-06-14 LAB — POCT UA - MICROSCOPIC ONLY
BACTERIA, U MICROSCOPIC: NEGATIVE
RBC, URINE, MICROSCOPIC: NEGATIVE

## 2014-06-14 MED ORDER — METRONIDAZOLE 0.75 % VA GEL: 1.0000 | Freq: Every day | VAGINAL | Status: DC

## 2014-06-14 MED ORDER — AMPHETAMINE-DEXTROAMPHET ER 10 MG PO CP24: 10.0000 mg | ORAL_CAPSULE | Freq: Every day | ORAL | Status: DC

## 2014-06-14 MED ORDER — FLUCONAZOLE 150 MG PO TABS: ORAL_TABLET | ORAL | Status: DC

## 2014-06-14 NOTE — Patient Instructions (Signed)
Linda [name] or Linda [name]

## 2014-06-19 LAB — TSH: TSH: 9.113 u[IU]/mL — AB (ref 0.350–4.500)

## 2014-06-20 ENCOUNTER — Other Ambulatory Visit: Payer: Self-pay | Admitting: *Deleted

## 2014-06-20 DIAGNOSIS — E039 Hypothyroidism, unspecified: Secondary | ICD-10-CM

## 2014-06-20 NOTE — Progress Notes (Signed)
Patient notified and verbalized understanding of the test results. No further questions. 

## 2014-06-21 ENCOUNTER — Encounter: Payer: Self-pay | Admitting: Nurse Practitioner

## 2014-06-21 ENCOUNTER — Other Ambulatory Visit: Payer: Self-pay | Admitting: Nurse Practitioner

## 2014-06-21 DIAGNOSIS — E039 Hypothyroidism, unspecified: Secondary | ICD-10-CM | POA: Insufficient documentation

## 2014-06-21 MED ORDER — LEVOTHYROXINE SODIUM 50 MCG PO TABS: 50.0000 ug | ORAL_TABLET | Freq: Every day | ORAL | Status: DC

## 2014-06-21 NOTE — Progress Notes (Signed)
Subjective:  Presents for complaints of vaginal odor after intercourse is been going on for about 3 years. Light brown to clear discharge, no itching or burning. Has done some douching in the past, none lately. Slight burning with urination at times, has a history of interstitial cystitis being followed by urology. No frequency or urgency. No fever. Same sexual partner. No pelvic pain. No back pain. Also requesting restart on her ADD medicine, getting ready to start classes up in a few weeks. Would like to start on low-dose at first. Not on regular contraceptive, not having intercourse at this time. Discussed IUD.  Objective:   BP 112/68  Temp(Src) 98 F (36.7 C)  Ht 5\' 5"  (1.651 m)  Wt 238 lb (107.956 kg)  BMI 39.61 kg/m2 NAD. Alert, oriented. Lungs clear. Heart regular rate rhythm. Abdomen soft nondistended nontender. No CVA or flank tenderness. External GU no rashes or lesions; a cotton swab of vaginal discharge was obtained. UA trace leukocytes. Urine microscopic rare WBC with multiple epithelials cells. Wet prep pH 5.0 with occasional clue cells and rare yeast minimal bacteria.  Assessment: Dysuria - Plan: POCT urinalysis dipstick, POCT UA - Microscopic Only  Bacterial vaginosis - Plan: POCT Wet Prep with KOH  ADD (attention deficit disorder) without hyperactivity  Other malaise and fatigue - Plan: TSH  Plan: Meds ordered this encounter  Medications  . metroNIDAZOLE (METROGEL VAGINAL) 0.75 % vaginal gel    Sig: Place 1 Applicatorful vaginally at bedtime. X 5 d    Dispense:  70 g    Refill:  0    Order Specific Question:  Supervising Provider    Answer:  Merlyn AlbertLUKING, WILLIAM S [[id number]]  . fluconazole (DIFLUCAN) 150 MG tablet    Sig: One po qd prn yeast infection; may repeat in 3-4 days if needed    Dispense:  2 tablet    Refill:  0    Order Specific Question:  Supervising Provider    Answer:  Merlyn AlbertLUKING, WILLIAM S [[id number]]  . amphetamine-dextroamphetamine (ADDERALL XR) 10 MG 24 hr capsule     Sig: Take 1 capsule (10 mg total) by mouth daily.    Dispense:  30 capsule    Refill:  0    Order Specific Question:  Supervising Provider    Answer:  Merlyn AlbertLUKING, WILLIAM S [[id number]]   Call back if symptoms worsen or persist. Also let us know about dosing for her Adderall.

## 2014-06-24 ENCOUNTER — Telehealth: Payer: Self-pay | Admitting: Family Medicine

## 2014-06-24 MED ORDER — ALBUTEROL SULFATE HFA 108 (90 BASE) MCG/ACT IN AERS
2.0000 | INHALATION_SPRAY | RESPIRATORY_TRACT | Status: DC | PRN
Start: 2014-06-24 — End: 2015-06-06

## 2014-06-24 NOTE — Telephone Encounter (Signed)
[facility] [place]

## 2014-06-24 NOTE — Telephone Encounter (Signed)
Medication sent to pharmacy  

## 2014-07-12 ENCOUNTER — Encounter: Payer: Self-pay | Admitting: Family Medicine

## 2014-07-12 ENCOUNTER — Ambulatory Visit (INDEPENDENT_AMBULATORY_CARE_PROVIDER_SITE_OTHER): Payer: 59 | Admitting: Family Medicine

## 2014-07-12 VITALS — BP 118/78 | Ht 65.0 in | Wt 234.0 lb

## 2014-07-12 DIAGNOSIS — I889 Nonspecific lymphadenitis, unspecified: Secondary | ICD-10-CM

## 2014-07-12 NOTE — Progress Notes (Signed)
   Subjective:    Patient ID: Linda [name], female    DOB: March 13, 1991, 22 y.o.   MRN: [id number]  HPI Patient is here today with sore throat  & swollen tonsils. Patient has no other concerns.  Pt went to [facility]  Hx if abscess abd wiried about the potential for this  Was told might be viral and might be strep  Took two doses of keflex but hasn't changed much, though some improvement.   No sig fever  Son has had a viral illness since last week  Review of Systems No vomiting no diarrhea no rash no headache ROS otherwise negative    Objective:   Physical Exam  Alert slight malaise. Vital stable. Lungs clear. Heart regular rate and rhythm. Pharynx erythematous no particular enlargement. Right anterior nodes tender.      Assessment & Plan:  Impression 1 lymphadenitis #2 pharyngitis plan maintain Keflex. Switch to Biaxin if worsens. Warning signs discussed. No evidence of tonsillar abscess discussed. WSL and

## 2014-07-30 ENCOUNTER — Encounter: Payer: Self-pay | Admitting: Family Medicine

## 2014-07-30 ENCOUNTER — Ambulatory Visit (INDEPENDENT_AMBULATORY_CARE_PROVIDER_SITE_OTHER): Payer: 59 | Admitting: Family Medicine

## 2014-07-30 VITALS — BP 110/78 | Temp 98.0°F | Ht 65.0 in | Wt 231.0 lb

## 2014-07-30 DIAGNOSIS — J310 Chronic rhinitis: Secondary | ICD-10-CM

## 2014-07-30 DIAGNOSIS — J329 Chronic sinusitis, unspecified: Secondary | ICD-10-CM

## 2014-07-30 MED ORDER — FLUCONAZOLE 150 MG PO TABS: ORAL_TABLET | ORAL | Status: DC

## 2014-07-30 MED ORDER — AMOXICILLIN-POT CLAVULANATE 875-125 MG PO TABS: 1.0000 | ORAL_TABLET | Freq: Two times a day (BID) | ORAL | Status: AC

## 2014-07-30 NOTE — Progress Notes (Signed)
   Subjective:    Patient ID: Linda [name], female    DOB: 1991-09-06, 23 y.o.   MRN: [id number]  Cough This is a new problem. The current episode started in the past 7 days. The problem has been unchanged. The cough is productive of sputum. Associated symptoms include a fever, headaches, shortness of breath and wheezing. Nothing aggravates the symptoms. Treatments tried: tylenol. The treatment provided no relief.   Patient states that she has no other concerns at this time.   Face flushed  Headache dull  Prod ncough diminished energy  Patient unfortunately still smoking.   Review of Systems  Constitutional: Positive for fever.  Respiratory: Positive for cough, shortness of breath and wheezing.   Neurological: Positive for headaches.       Objective:   Physical Exam Alert moderate malaise. Frontal tenderness mass or tenderness Lungs rare wheeze heart regular in rhythm       Assessment & Plan:  Impression rhinosinusitis with reactive airways plan antibiotics prescribed. Albuterol when necessary for wheezing. Symptomatic care discussed. WSL

## 2014-08-05 ENCOUNTER — Other Ambulatory Visit: Payer: Self-pay | Admitting: Nurse Practitioner

## 2014-08-05 ENCOUNTER — Encounter: Payer: Self-pay | Admitting: Nurse Practitioner

## 2014-08-05 ENCOUNTER — Other Ambulatory Visit: Payer: Self-pay | Admitting: *Deleted

## 2014-08-05 ENCOUNTER — Ambulatory Visit (INDEPENDENT_AMBULATORY_CARE_PROVIDER_SITE_OTHER): Payer: 59 | Admitting: Nurse Practitioner

## 2014-08-05 VITALS — BP 116/74 | Ht 65.0 in | Wt 234.0 lb

## 2014-08-05 DIAGNOSIS — F411 Generalized anxiety disorder: Secondary | ICD-10-CM

## 2014-08-05 DIAGNOSIS — N912 Amenorrhea, unspecified: Secondary | ICD-10-CM

## 2014-08-05 DIAGNOSIS — F419 Anxiety disorder, unspecified: Secondary | ICD-10-CM

## 2014-08-05 DIAGNOSIS — Z79899 Other long term (current) drug therapy: Secondary | ICD-10-CM

## 2014-08-05 DIAGNOSIS — R5381 Other malaise: Secondary | ICD-10-CM

## 2014-08-05 DIAGNOSIS — Z3009 Encounter for other general counseling and advice on contraception: Secondary | ICD-10-CM

## 2014-08-05 DIAGNOSIS — B373 Candidiasis of vulva and vagina: Secondary | ICD-10-CM

## 2014-08-05 DIAGNOSIS — Z113 Encounter for screening for infections with a predominantly sexual mode of transmission: Secondary | ICD-10-CM

## 2014-08-05 DIAGNOSIS — B3731 Acute candidiasis of vulva and vagina: Secondary | ICD-10-CM

## 2014-08-05 DIAGNOSIS — Z7251 High risk heterosexual behavior: Secondary | ICD-10-CM

## 2014-08-05 DIAGNOSIS — Z0000 Encounter for general adult medical examination without abnormal findings: Secondary | ICD-10-CM

## 2014-08-05 DIAGNOSIS — R5383 Other fatigue: Secondary | ICD-10-CM

## 2014-08-05 LAB — POCT URINE PREGNANCY: Preg Test, Ur: NEGATIVE

## 2014-08-05 LAB — POCT WET PREP WITH KOH
Bacteria Wet Prep HPF POC: NEGATIVE
Clue Cells Wet Prep HPF POC: NEGATIVE
EPITHELIAL WET PREP PER HPF POC: POSITIVE
KOH Prep POC: NEGATIVE
RBC WET PREP PER HPF POC: NEGATIVE
Trichomonas, UA: NEGATIVE
WBC Wet Prep HPF POC: NEGATIVE
Yeast Wet Prep HPF POC: POSITIVE
pH: 4.5

## 2014-08-05 LAB — HCG, SERUM, QUALITATIVE: PREG SERUM: NEGATIVE

## 2014-08-05 MED ORDER — CLONAZEPAM 0.5 MG PO TABS: 0.5000 mg | ORAL_TABLET | Freq: Two times a day (BID) | ORAL | Status: DC | PRN

## 2014-08-05 MED ORDER — CITALOPRAM HYDROBROMIDE 40 MG PO TABS: 40.0000 mg | ORAL_TABLET | Freq: Every day | ORAL | Status: DC

## 2014-08-06 LAB — GC/CHLAMYDIA PROBE AMP
CT PROBE, AMP APTIMA: NEGATIVE
GC PROBE AMP APTIMA: NEGATIVE

## 2014-08-07 ENCOUNTER — Encounter: Payer: Self-pay | Admitting: Nurse Practitioner

## 2014-08-07 NOTE — Progress Notes (Signed)
Subjective:  Presents for c/o vaginal discharge that began 4 days ago. Slight green color and odor. Minimal fever. Minimal pelvic pain. Had unprotected sex with a new partner 8 days ago. Did not use condoms. No genital lesions. Slight dysuria. No menses for at least 2 months. Has gained significant weight. Separated from her husband x 2 months. He left her and the baby. Has increased her Celexa to 40 mg per day. Experiencing anxiety and depression flare up. Taking Ativan 3 x per week with no relief of panic attacks. Has done counseling before; would like to restart this.   Objective:   BP 116/74  Ht  (1.651 m)  Wt 234 lb (106.142 kg)  BMI 38.94 kg/m2  LMP 07/11/2014 NAD. Alert, oriented. Lungs clear. Heart RRR. abd soft, nondistended, nontender. External GU: normal. Cervix normal in appearance, no CMT. Very small amount thick white discharge noted. Bimanual exam: no tenderness or obvious masses; exam limited due to abd girth.  Results for orders placed in visit on 08/05/14  POCT URINE PREGNANCY      Result Value Ref Range   Preg Test, Ur Negative    POCT WET PREP WITH KOH      Result Value Ref Range   Trichomonas, UA Negative     Clue Cells Wet Prep HPF POC neg     Epithelial Wet Prep HPF POC pos     Yeast Wet Prep HPF POC pos     Bacteria Wet Prep HPF POC neg     RBC Wet Prep HPF POC neg     WBC Wet Prep HPF POC neg     KOH Prep POC Negative     pH 4.5       Assessment:  Problem List Items Addressed This Visit     Other   Chronic anxiety   Relevant Medications      citalopram (CELEXA) tablet    Other Visit Diagnoses   Unprotected sexual intercourse    -  Primary    Relevant Orders       POCT urine pregnancy (Completed)       HIV antibody       RPR       Hepatitis C antibody    Vagina, candidiasis        Relevant Orders       POCT Wet Prep with KOH (Completed)    Other malaise and fatigue        Relevant Orders       TSH       Insulin, Fasting    Screen for STD  (sexually transmitted disease)        Relevant Orders       GC/chlamydia probe amp, urine       HIV antibody       RPR       Hepatitis C antibody    Amenorrhea        Routine general medical examination at a health care facility        Relevant Orders       Lipid panel    Encounter for long-term (current) use of other medications        Relevant Orders       Hepatic function panel       Basic metabolic panel       Plan:  Meds ordered this encounter  Medications  . clonazePAM (KLONOPIN) 0.5 MG tablet    Sig: Take 1 tablet (0.5 mg total) by  mouth 2 (two) times daily as needed for anxiety.    Dispense:  30 tablet    Refill:  0    Order Specific Question:  Supervising Provider    Answer:  Merlyn Albert [[id number]]  . citalopram (CELEXA) 40 MG tablet    Sig: Take 1 tablet (40 mg total) by mouth daily.    Dispense:  90 tablet    Refill:  1    Order Specific Question:  Supervising Provider    Answer:  Merlyn Albert [[id number]]   Take Diflucan as directed. Will refer back to counselor. Call back sooner if any problems. Will also refer for Mirena per her request. Discussed safe sex issues.

## 2014-08-20 ENCOUNTER — Encounter: Payer: Self-pay | Admitting: Family Medicine

## 2014-08-20 ENCOUNTER — Ambulatory Visit (INDEPENDENT_AMBULATORY_CARE_PROVIDER_SITE_OTHER): Payer: 59 | Admitting: Family Medicine

## 2014-08-20 VITALS — BP 116/78 | Ht 65.0 in | Wt 241.0 lb

## 2014-08-20 DIAGNOSIS — J310 Chronic rhinitis: Secondary | ICD-10-CM

## 2014-08-20 DIAGNOSIS — J329 Chronic sinusitis, unspecified: Secondary | ICD-10-CM

## 2014-08-20 MED ORDER — CEFDINIR 300 MG PO CAPS: 300.0000 mg | ORAL_CAPSULE | Freq: Two times a day (BID) | ORAL | Status: DC

## 2014-08-20 NOTE — Progress Notes (Signed)
   Subjective:    Patient ID: Linda RossettiLisabeth D [name], female    DOB: 10-26-1991, 23 y.o.   MRN: [id number]  Sore Throat  This is a new problem. The current episode started in the past 7 days. The problem has been gradually worsening. There has been no fever (98.9). Associated symptoms include coughing and headaches. Associated symptoms comments: Sore throat, runny nose, coughing up phlegm.. She has tried acetaminophen for the symptoms. The treatment provided no relief.   Cough productive of hemoptysis.  Patient does smoke.  Frontal headache intermittently Review of Systems  Respiratory: Positive for cough.   Neurological: Positive for headaches.   No rash    Objective:   Physical Exam Alert no apparent distress. Vitals stable. Frontal mass or tenderness evident. Pharynx normal neck supple lungs rare wheeze with deep breath heart regular in rhythm.       Assessment & Plan:  Impression rhinosinusitis/bronchitis plan Omnicef twice a day 10 days. Stop smoking. Albuterol when necessary. Symptomatic care discussed. WSL

## 2014-09-03 ENCOUNTER — Telehealth: Payer: Self-pay | Admitting: Family Medicine

## 2014-09-03 DIAGNOSIS — F4323 Adjustment disorder with mixed anxiety and depressed mood: Secondary | ICD-10-CM

## 2014-09-03 NOTE — Telephone Encounter (Signed)
[facility] for her to come see them for anxiety.

## 2014-09-04 NOTE — Telephone Encounter (Signed)
Ok lets 

## 2014-09-04 NOTE — Telephone Encounter (Signed)
Referral ordered in Epic and sent to Referral Coordinator.

## 2014-09-16 ENCOUNTER — Encounter: Payer: Self-pay | Admitting: Family Medicine

## 2014-09-17 ENCOUNTER — Encounter: Payer: 59 | Admitting: Adult Health

## 2014-09-30 ENCOUNTER — Encounter: Payer: 59 | Admitting: Adult Health

## 2014-10-17 ENCOUNTER — Ambulatory Visit (INDEPENDENT_AMBULATORY_CARE_PROVIDER_SITE_OTHER): Payer: 59 | Admitting: Nurse Practitioner

## 2014-10-17 ENCOUNTER — Encounter: Payer: Self-pay | Admitting: Nurse Practitioner

## 2014-10-17 ENCOUNTER — Ambulatory Visit (HOSPITAL_COMMUNITY): Payer: Self-pay | Admitting: Psychiatry

## 2014-10-17 VITALS — BP 112/74 | Temp 98.5°F | Ht 65.5 in | Wt 238.0 lb

## 2014-10-17 DIAGNOSIS — N390 Urinary tract infection, site not specified: Secondary | ICD-10-CM

## 2014-10-17 DIAGNOSIS — Z113 Encounter for screening for infections with a predominantly sexual mode of transmission: Secondary | ICD-10-CM

## 2014-10-17 DIAGNOSIS — F988 Other specified behavioral and emotional disorders with onset usually occurring in childhood and adolescence: Secondary | ICD-10-CM

## 2014-10-17 DIAGNOSIS — F900 Attention-deficit hyperactivity disorder, predominantly inattentive type: Secondary | ICD-10-CM

## 2014-10-17 LAB — POCT URINALYSIS DIPSTICK
Blood, UA: 250
PH UA: 7

## 2014-10-17 LAB — POCT UA - MICROSCOPIC ONLY: BACTERIA, U MICROSCOPIC: POSITIVE

## 2014-10-17 MED ORDER — NITROFURANTOIN MONOHYD MACRO 100 MG PO CAPS: 100.0000 mg | ORAL_CAPSULE | Freq: Two times a day (BID) | ORAL | Status: DC

## 2014-10-17 MED ORDER — AMPHETAMINE-DEXTROAMPHET ER 20 MG PO CP24: 20.0000 mg | ORAL_CAPSULE | ORAL | Status: DC

## 2014-10-17 MED ORDER — AMPHETAMINE-DEXTROAMPHET ER 30 MG PO CP24: 30.0000 mg | ORAL_CAPSULE | ORAL | Status: DC

## 2014-10-18 LAB — GC/CHLAMYDIA PROBE AMP, URINE
Chlamydia, Swab/Urine, PCR: NEGATIVE
GC Probe Amp, Urine: NEGATIVE

## 2014-10-20 ENCOUNTER — Encounter: Payer: Self-pay | Admitting: Nurse Practitioner

## 2014-10-20 NOTE — Progress Notes (Signed)
Patient ID: Linda RossettiLisabeth D [name], female   DOB: [birth year], 23 y.o.   MRN: [id number]  Subjective:  Presents for routine follow-up of her ADD. Can tell a slight difference while she is on Adderall XR 10 mg. Now working full-time. Was on 40 mg at one point, requesting titration back to this dose. Also complaining of urinary symptoms for the past 6 days. Fever initially which has resolved. Dysuria. Urgency. Frequency. No mid back or flank pain. No vaginal discharge. Taking Tylenol and AZO. No new sexual partners. At the very end of the visit, patient requested testing for GC and Chlamydia. Taking fluids well. Note she is on her menses today.  Objective:   BP 112/74 mmHg  Temp(Src) 98.5 F (36.9 C) (Oral)  Ht 5' 5.5" (1.664 m)  Wt 238 lb (107.956 kg)  BMI 38.99 kg/m2 NAD. Alert, oriented. Lungs clear. Heart regular rate rhythm. No CVA or flank tenderness. Abdomen soft nondistended with mild suprapubic area discomfort. Results for orders placed or performed in visit on 10/17/14  GC/chlamydia probe amp, urine  Result Value Ref Range   Chlamydia, Swab/Urine, PCR NEGATIVE NEGATIVE   GC Probe Amp, Urine NEGATIVE NEGATIVE  POCT urinalysis dipstick  Result Value Ref Range   Color, UA     Clarity, UA     Glucose, UA     Bilirubin, UA ++    Ketones, UA     Spec Grav, UA <=1.005    Blood, UA 250    pH, UA 7.0    Protein, UA     Urobilinogen, UA     Nitrite, UA     Leukocytes, UA small (1+)   POCT UA - Microscopic Only  Result Value Ref Range   WBC, Ur, HPF, POC 0-5    RBC, urine, microscopic 5+    Bacteria, U Microscopic pos    Mucus, UA     Epithelial cells, urine per micros occas    Crystals, Ur, HPF, POC     Casts, Ur, LPF, POC     Yeast, UA       Assessment:  Problem List Items Addressed This Visit      Other   ADD (attention deficit disorder) without hyperactivity    Other Visit Diagnoses    Urinary tract infection without hematuria, site unspecified    -  Primary    Relevant  Medications       MACROBID 100 MG PO CAPS    Other Relevant Orders       POCT urinalysis dipstick (Completed)       GC/chlamydia probe amp, urine (Completed)       POCT UA - Microscopic Only (Completed)    Screening for STD (sexually transmitted disease)        Relevant Orders       GC/chlamydia probe amp, urine (Completed)       Plan: Note the blood under microscopic exam was most likely due to her menses. GC and Chlamydia pending. Meds ordered this encounter  Medications  . DISCONTD: citalopram (CELEXA) 20 MG tablet    Sig:     Refill:  0  . nitrofurantoin, macrocrystal-monohydrate, (MACROBID) 100 MG capsule    Sig: Take 1 capsule (100 mg total) by mouth 2 (two) times daily.    Dispense:  10 capsule    Refill:  0    Order Specific Question:  Supervising Provider    Answer:  Merlyn AlbertLUKING, WILLIAM S [[id number]]  . DISCONTD: amphetamine-dextroamphetamine (ADDERALL XR) 20 MG 24  hr capsule    Sig: Take 1 capsule (20 mg total) by mouth every morning.    Dispense:  30 capsule    Refill:  0    Order Specific Question:  Supervising Provider    Answer:  Merlyn AlbertLUKING, WILLIAM S [[id number]]  . DISCONTD: amphetamine-dextroamphetamine (ADDERALL XR) 30 MG 24 hr capsule    Sig: Take 1 capsule (30 mg total) by mouth every morning.    Dispense:  30 capsule    Refill:  0    May fill 30 days from 10/17/14    Order Specific Question:  Supervising Provider    Answer:  Merlyn AlbertLUKING, WILLIAM S [[id number]]  . amphetamine-dextroamphetamine (ADDERALL XR) 30 MG 24 hr capsule    Sig: Take 1 capsule (30 mg total) by mouth every morning.    Dispense:  30 capsule    Refill:  0    May fill 60 days from 10/17/14    Order Specific Question:  Supervising Provider    Answer:  Merlyn AlbertLUKING, WILLIAM S [[id number]]   Call back in 4-5 days if no improvement in symptoms, sooner if worse. Warning signs reviewed. Also will titrate Adderall dose up to 30 mg. Patient to call back if any problems. Return in about 3 months (around 01/16/2015).

## 2014-11-04 ENCOUNTER — Telehealth (HOSPITAL_COMMUNITY): Payer: Self-pay | Admitting: *Deleted

## 2014-11-04 NOTE — Telephone Encounter (Signed)
lmtcb on pt home number 8:13am 11-04-14 to resch appt due to provider out of office. Number provided

## 2014-11-22 NOTE — Progress Notes (Signed)
Patient notified and verbalized understanding of test results. No further questions.  

## 2014-12-02 ENCOUNTER — Telehealth (HOSPITAL_COMMUNITY): Payer: Self-pay | Admitting: *Deleted

## 2014-12-02 ENCOUNTER — Encounter: Payer: Self-pay | Admitting: Family Medicine

## 2014-12-02 ENCOUNTER — Ambulatory Visit (INDEPENDENT_AMBULATORY_CARE_PROVIDER_SITE_OTHER): Payer: 59 | Admitting: Nurse Practitioner

## 2014-12-02 ENCOUNTER — Encounter: Payer: Self-pay | Admitting: Nurse Practitioner

## 2014-12-02 ENCOUNTER — Ambulatory Visit (HOSPITAL_COMMUNITY): Payer: Self-pay | Admitting: Psychiatry

## 2014-12-02 VITALS — BP 118/72 | Temp 97.9°F | Ht 65.5 in | Wt 242.0 lb

## 2014-12-02 DIAGNOSIS — J329 Chronic sinusitis, unspecified: Secondary | ICD-10-CM

## 2014-12-02 DIAGNOSIS — J029 Acute pharyngitis, unspecified: Secondary | ICD-10-CM

## 2014-12-02 MED ORDER — AMOXICILLIN-POT CLAVULANATE 875-125 MG PO TABS: 1.0000 | ORAL_TABLET | Freq: Two times a day (BID) | ORAL | Status: DC

## 2014-12-02 NOTE — Progress Notes (Signed)
Subjective:  Presents complaints of swelling in the tonsils low-grade fever and white patches that began 5 days ago. Minimal cough. No wheezing. Facial area headache. Ear pain. No rash. Has taken 4 doses of Augmentin over the weekend.  Objective:   BP 118/72 mmHg  Temp(Src) 97.9 F (36.6 C) (Oral)  Ht 5' 5.5" (1.664 m)  Wt 242 lb (109.77 kg)  BMI 39.64 kg/m2 NAD. Alert, oriented. TMs clear effusion with sclerotic changes on the left ear. Pharynx nonerythematous with probable tiny white patches on the tonsils more on the left side. Slight green PND noted. Neck supple with minimal anterior adenopathy, no posterior cervical adenopathy. Lungs clear. Heart regular rate rhythm.  Assessment:Rhinosinusitis  Exudative pharyngitis  Plan:  Meds ordered this encounter  Medications  . amoxicillin-clavulanate (AUGMENTIN) 875-125 MG per tablet    Sig: Take 1 tablet by mouth 2 (two) times daily.    Dispense:  20 tablet    Refill:  0    Order Specific Question:  Supervising Provider    Answer:  Merlyn AlbertLUKING, WILLIAM S [[id number]]   OTC meds as directed for congestion. Call back by the end of the week if no improvement, sooner if worse.

## 2015-01-07 ENCOUNTER — Telehealth (HOSPITAL_COMMUNITY): Payer: Self-pay | Admitting: *Deleted

## 2015-01-07 ENCOUNTER — Ambulatory Visit (HOSPITAL_COMMUNITY): Payer: Self-pay | Admitting: Psychiatry

## 2015-01-07 NOTE — Telephone Encounter (Signed)
Called pt mobile and lmtcb due to appt. Number provided

## 2015-01-07 NOTE — Telephone Encounter (Signed)
lmtcb on pt home number. Number provided 

## 2015-01-27 ENCOUNTER — Encounter: Payer: Self-pay | Admitting: Family Medicine

## 2015-01-27 ENCOUNTER — Ambulatory Visit (INDEPENDENT_AMBULATORY_CARE_PROVIDER_SITE_OTHER): Payer: 59 | Admitting: Family Medicine

## 2015-01-27 VITALS — BP 118/80 | Temp 98.4°F | Ht 65.5 in | Wt 244.4 lb

## 2015-01-27 DIAGNOSIS — H6503 Acute serous otitis media, bilateral: Secondary | ICD-10-CM

## 2015-01-27 MED ORDER — CEFDINIR 300 MG PO CAPS: 300.0000 mg | ORAL_CAPSULE | Freq: Two times a day (BID) | ORAL | Status: DC

## 2015-01-27 MED ORDER — FLUCONAZOLE 150 MG PO TABS: ORAL_TABLET | ORAL | Status: DC

## 2015-01-27 NOTE — Progress Notes (Signed)
   Subjective:    Patient ID: Mcarthur RossettiLisabeth D Lovelace, female    DOB: 1991-02-26, 24 y.o.   MRN: [id number]  Otalgia  There is pain in both ears. This is a new problem. The current episode started in the past 7 days. The problem occurs constantly. The problem has been unchanged. Maximum temperature: low grade fever. The pain is moderate. Associated symptoms include ear discharge. She has tried acetaminophen for the symptoms. The treatment provided no relief.   No other concerns at this time.  Some cough and cong  Low gr fevdr   Tylenol prn  resp infxn none at home   Review of Systems  HENT: Positive for ear discharge and ear pain.        Objective:   Physical Exam Alert moderate malaise. Hydration. H&T frontal maxillary tenderness pharynx erythematous neck supple. Lungs clear heart regular in rhythm       Assessment & Plan:  Impression bilateral otitis with rhinosinusitis plan antibiotics prescribed. Symptomatic care discussed. Warning signs discussed. WSL

## 2015-01-29 ENCOUNTER — Encounter (HOSPITAL_COMMUNITY): Payer: Self-pay

## 2015-01-29 ENCOUNTER — Inpatient Hospital Stay (HOSPITAL_COMMUNITY)
Admission: AD | Admit: 2015-01-29 | Discharge: 2015-01-30 | Disposition: A | Discharge status: Home / Self Care | Payer: 59 | Source: Ambulatory Visit | Attending: Obstetrics & Gynecology | Admitting: Obstetrics & Gynecology

## 2015-01-29 DIAGNOSIS — R112 Nausea with vomiting, unspecified: Secondary | ICD-10-CM | POA: Diagnosis present

## 2015-01-29 DIAGNOSIS — Z3202 Encounter for pregnancy test, result negative: Secondary | ICD-10-CM | POA: Diagnosis not present

## 2015-01-29 DIAGNOSIS — K529 Noninfective gastroenteritis and colitis, unspecified: Secondary | ICD-10-CM | POA: Insufficient documentation

## 2015-01-29 DIAGNOSIS — Z87891 Personal history of nicotine dependence: Secondary | ICD-10-CM | POA: Insufficient documentation

## 2015-01-29 DIAGNOSIS — A09 Infectious gastroenteritis and colitis, unspecified: Secondary | ICD-10-CM | POA: Diagnosis not present

## 2015-01-29 LAB — POCT PREGNANCY, URINE: Preg Test, Ur: NEGATIVE

## 2015-01-29 LAB — URINALYSIS, ROUTINE W REFLEX MICROSCOPIC
Bilirubin Urine: NEGATIVE
Glucose, UA: NEGATIVE mg/dL
HGB URINE DIPSTICK: NEGATIVE
KETONES UR: NEGATIVE mg/dL
Nitrite: NEGATIVE
PROTEIN: NEGATIVE mg/dL
Specific Gravity, Urine: 1.025 (ref 1.005–1.030)
Urobilinogen, UA: 0.2 mg/dL (ref 0.0–1.0)
pH: 6 (ref 5.0–8.0)

## 2015-01-29 LAB — URINE MICROSCOPIC-ADD ON

## 2015-01-29 MED ORDER — LOPERAMIDE HCL 2 MG PO CAPS
4.0000 mg | ORAL_CAPSULE | Freq: Once | ORAL | Status: AC
  Administered 2015-01-30: 4 mg via ORAL
  Filled 2015-01-29: qty 2

## 2015-01-29 MED ORDER — ONDANSETRON 8 MG PO TBDP
8.0000 mg | ORAL_TABLET | Freq: Once | ORAL | Status: AC
  Administered 2015-01-30: 8 mg via ORAL
  Filled 2015-01-29: qty 1

## 2015-01-29 NOTE — MAU Provider Note (Signed)
Chief Complaint: Possible Pregnancy and Abdominal Pain   First Provider Initiated Contact with Patient 01/29/15 2350     SUBJECTIVE HPI: Linda [name] is a 24 y.o. [id number] female who presents with nausea, vomiting and diarrhea today. Vomited more times than she can count. Approximately 4 episodes of watery, foul-smelling diarrhea. Also concerned because she has not had a period since January. Normally has monthly cycles. Has not seen gynecologist for this problem. Has not taken pregnancy test. Has been having generalized abdominal cramping all day. 2-year-old son has had similar symptoms.  Hasn't tried anything for her symptoms. Able to keep down fluids, but not food.  Past Medical History  Diagnosis Date  . Asthma   . IBS (irritable bowel syndrome)   . Heart murmur   . Interstitial cystitis   . Anxiety    OB History  Gravida Para Term Preterm AB SAB TAB Ectopic Multiple Living     # Outcome Date GA Lbr Len/2nd Weight Sex Delivery Anes PTL Lv  1 Preterm 03/20/13 [redacted]w[redacted]d  2.14 kg (4 lb 11.5 oz) M CS-LTranv Spinal  Y     Past Surgical History  Procedure Laterality Date  . Upper gi endoscopy    . Tympanostomy tube placement    . No past surgeries    . Cesarean section N/A 03/20/2013    Procedure: CESAREAN SECTION;  Surgeon: [name], MD;  Location: [facility];  Service: Obstetrics;  Laterality: N/A;  . Cesarean section     History   Social History  . Marital Status: Single    Spouse Name: N/A  . Number of Children: N/A  . Years of Education: N/A   Occupational History  . Not on file.   Social History Main Topics  . Smoking status: Former Smoker -- 0.50 packs/day    Types: Cigarettes  . Smokeless tobacco: Never Used  . Alcohol Use: No  . Drug Use: No  . Sexual Activity: Yes   Other Topics Concern  . Not on file   Social History Narrative   No current facility-administered medications on file prior to encounter.   Current Outpatient  Prescriptions on File Prior to Encounter  Medication Sig Dispense Refill  . albuterol (PROVENTIL HFA;VENTOLIN HFA) 108 (90 BASE) MCG/ACT inhaler Inhale 2 puffs into the lungs every 4 (four) hours as needed for wheezing. For shortness of breath/asthma 1 Inhaler 5  . cefdinir (OMNICEF) 300 MG capsule Take 1 capsule (300 mg total) by mouth 2 (two) times daily. 20 capsule 0  . citalopram (CELEXA) 40 MG tablet Take 1 tablet (40 mg total) by mouth daily. 90 tablet 1  . amphetamine-dextroamphetamine (ADDERALL XR) 30 MG 24 hr capsule Take 1 capsule (30 mg total) by mouth every morning. 30 capsule 0  . clonazePAM (KLONOPIN) 0.5 MG tablet Take 1 tablet (0.5 mg total) by mouth 2 (two) times daily as needed for anxiety. 30 tablet 0  . fluconazole (DIFLUCAN) 150 MG tablet One three d apart (Patient not taking: Reported on 01/29/2015) 2 tablet 1  . levothyroxine (SYNTHROID, LEVOTHROID) 50 MCG tablet TAKE 1 TABLET BY MOUTH EVERY DAY 90 tablet 1   Allergies  Allergen Reactions  . Buspar [Buspirone] Other (See Comments)    Increased anxiety symptoms  . Sulfa Antibiotics Diarrhea and Nausea Only  Review of Systems  Constitutional: Negative for fever and chills.  HENT: Negative for congestion and sore throat.   Respiratory: Negative for cough.  Gastrointestinal: Positive for nausea, vomiting, abdominal pain and diarrhea. Negative for constipation and blood in stool.  Genitourinary: Negative for dysuria, urgency, frequency, hematuria and flank pain.  Musculoskeletal: Positive for myalgias. Negative for neck pain.  Neurological: Positive for weakness. Negative for dizziness.    OBJECTIVE Blood pressure 122/64, pulse 102, temperature 97.8 F (36.6 C), temperature source Oral, resp. rate 20, height  (1.626 m), weight 109.43 kg (241 lb 4 oz), last menstrual period 11/30/2014, SpO2 100 %, not currently breastfeeding. GENERAL: Well-developed, well-nourished, obese female in mild distress.  HEENT:  Normocephalic. Uterus membranes moist. HEART: normal rate RESP: normal effort ABDOMEN: Soft, non-tender NEURO: Alert and oriented SPECULUM EXAM: Deferred  LAB RESULTS Results for orders placed or performed during the hospital encounter of 01/29/15 (from the past 24 hour(s))  Urinalysis, Routine w reflex microscopic     Status: Abnormal   Collection Time: 01/29/15 10:58 PM  Result Value Ref Range   Color, Urine YELLOW YELLOW   APPearance CLEAR CLEAR   Specific Gravity, Urine 1.025 1.005 - 1.030   pH 6.0 5.0 - 8.0   Glucose, UA NEGATIVE NEGATIVE mg/dL   Hgb urine dipstick NEGATIVE NEGATIVE   Bilirubin Urine NEGATIVE NEGATIVE   Ketones, ur NEGATIVE NEGATIVE mg/dL   Protein, ur NEGATIVE NEGATIVE mg/dL   Urobilinogen, UA 0.2 0.0 - 1.0 mg/dL   Nitrite NEGATIVE NEGATIVE   Leukocytes, UA SMALL (A) NEGATIVE  Urine microscopic-add on     Status: Abnormal   Collection Time: 01/29/15 10:58 PM  Result Value Ref Range   Squamous Epithelial / LPF FEW (A) RARE   WBC, UA 3-6 <3 WBC/hpf   RBC / HPF 0-2 <3 RBC/hpf   Bacteria, UA FEW (A) RARE  Pregnancy, urine POC     Status: None   Collection Time: 01/29/15 11:22 PM  Result Value Ref Range   Preg Test, Ur NEGATIVE NEGATIVE    IMAGING No results found.  MAU COURSE Zofran ODT and Imodium given. C. difficile PCR collected.  No further vomiting. Nausea much better. Able to keep down fluids well in MAU.  ASSESSMENT 1. Gastroenteritis presumed infectious    PLAN Discharge home in stable condition. Clear liquid diet 24 hours, advance diet as tolerated C. difficile PCR pending.  Encouraged frequent soap and water handwashing. Follow-up Information    Follow up with Harlow Asa, MD.   Specialty:  Family Medicine   Why:  As needed if symptoms worsen   Contact information:   [address] [place] [address] [phone]       Follow up with [facility].   Why:  As needed in emergencies   Contact information:   [address] [place] [address]       Follow up with [name], MD.   Specialty:  Obstetrics and Gynecology   Why:  for menstrual irregularities   Contact information:   [address] [place] [address] [phone]         Medication List    STOP taking these medications        amphetamine-dextroamphetamine 30 MG 24 hr capsule  Commonly known as:  ADDERALL XR     clonazePAM 0.5 MG tablet  Commonly known as:  KLONOPIN     levothyroxine 50 MCG tablet  Commonly known as:  SYNTHROID, LEVOTHROID      TAKE these medications        albuterol 108 (90 BASE) MCG/ACT inhaler  Commonly  known as:  PROVENTIL HFA;VENTOLIN HFA  Inhale 2 puffs into the lungs every 4 (four) hours as needed for wheezing. For shortness of breath/asthma     cefdinir 300 MG capsule  Commonly known as:  OMNICEF  Take 1 capsule (300 mg total) by mouth 2 (two) times daily.     citalopram 40 MG tablet  Commonly known as:  CELEXA  Take 1 tablet (40 mg total) by mouth daily.     fluconazole 150 MG tablet  Commonly known as:  DIFLUCAN  One three d apart     LORazepam 0.5 MG tablet  Commonly known as:  ATIVAN  Take 0.5 mg by mouth every 6 (six) hours as needed for anxiety.     ondansetron 8 MG disintegrating tablet  Commonly known as:  ZOFRAN ODT  Take 1 tablet (8 mg total) by mouth every 8 (eight) hours as needed for nausea or vomiting.       [place] Gianluca Chhim, CNM 01/29/2015  11:51 PM

## 2015-01-29 NOTE — MAU Note (Signed)
Last period sometime in January; doesn't know when. Hasn't taken pregnancy test at home. Vomiting since this morning; states hasn't been able to drink or eat all day. Diarrhea since this afternoon; 3-4 episodes. Denies vaginal bleeding. Lower abdominal & vaginal pain today. Felt warm this morning but didn't check temp.

## 2015-01-30 DIAGNOSIS — A09 Infectious gastroenteritis and colitis, unspecified: Secondary | ICD-10-CM

## 2015-01-30 LAB — CLOSTRIDIUM DIFFICILE BY PCR: CDIFFPCR: NEGATIVE

## 2015-01-30 MED ORDER — ACETAMINOPHEN 325 MG PO TABS
650.0000 mg | ORAL_TABLET | Freq: Once | ORAL | Status: AC
  Administered 2015-01-30: 650 mg via ORAL
  Filled 2015-01-30: qty 2

## 2015-01-30 MED ORDER — ONDANSETRON 8 MG PO TBDP
8.0000 mg | ORAL_TABLET | Freq: Three times a day (TID) | ORAL | Status: DC | PRN
Start: 2015-01-30 — End: 2015-06-09

## 2015-01-30 NOTE — Discharge Instructions (Signed)

## 2015-02-01 ENCOUNTER — Emergency Department: Payer: Self-pay | Admitting: Internal Medicine

## 2015-02-01 ENCOUNTER — Emergency Department: Payer: Self-pay | Admitting: Emergency Medicine

## 2015-03-07 ENCOUNTER — Telehealth: Payer: Self-pay | Admitting: Family Medicine

## 2015-03-07 MED ORDER — AMPHETAMINE-DEXTROAMPHET ER 30 MG PO CP24: 30.0000 mg | ORAL_CAPSULE | ORAL | Status: DC

## 2015-03-07 NOTE — Telephone Encounter (Signed)
Patient may have 2 week supply. Set up a follow-up office visit with Linda [name] within the next 2 weeks.

## 2015-03-07 NOTE — Telephone Encounter (Signed)
Notified patient may have 2 week supply. Set up a follow-up office visit with Linda [name] within the next 2 weeks. Script ready for pickup. Patient verbalized understanding.

## 2015-03-07 NOTE — Telephone Encounter (Signed)
Pt needs refill on her amphetamine-dextroamphetamine (ADDERALL XR) 20 MG 24 hr capsule (for some reason this is not on her med list but is listed in office note in December with Eber Jonesarolyn) Pt is completely out of this and needs refill as soon as possible.   Last OV for ADD was December Please call pt when done

## 2015-03-12 ENCOUNTER — Encounter: Payer: Self-pay | Admitting: Nurse Practitioner

## 2015-03-12 ENCOUNTER — Ambulatory Visit (INDEPENDENT_AMBULATORY_CARE_PROVIDER_SITE_OTHER): Payer: 59 | Admitting: Nurse Practitioner

## 2015-03-12 VITALS — BP 116/78 | Temp 97.8°F | Ht 65.0 in | Wt 244.4 lb

## 2015-03-12 DIAGNOSIS — R300 Dysuria: Secondary | ICD-10-CM

## 2015-03-12 DIAGNOSIS — N939 Abnormal uterine and vaginal bleeding, unspecified: Secondary | ICD-10-CM

## 2015-03-12 DIAGNOSIS — R102 Pelvic and perineal pain: Secondary | ICD-10-CM | POA: Diagnosis not present

## 2015-03-12 DIAGNOSIS — N390 Urinary tract infection, site not specified: Secondary | ICD-10-CM

## 2015-03-12 LAB — POCT UA - MICROSCOPIC ONLY

## 2015-03-12 LAB — POCT WET PREP WITH KOH
Clue Cells Wet Prep HPF POC: NEGATIVE
Epithelial Wet Prep HPF POC: POSITIVE
KOH Prep POC: NEGATIVE
RBC WET PREP PER HPF POC: NEGATIVE
Trichomonas, UA: NEGATIVE
pH, Wet Prep: 4.5

## 2015-03-12 LAB — POCT URINALYSIS DIPSTICK
SPEC GRAV UA: 1.025
pH, UA: 5

## 2015-03-12 LAB — HCG, SERUM, QUALITATIVE: HCG, BETA SUBUNIT, QUAL, SERUM: NEGATIVE m[IU]/mL (ref <6)

## 2015-03-12 LAB — POCT URINE PREGNANCY: Preg Test, Ur: NEGATIVE

## 2015-03-12 MED ORDER — NITROFURANTOIN MONOHYD MACRO 100 MG PO CAPS: 100.0000 mg | ORAL_CAPSULE | Freq: Two times a day (BID) | ORAL | Status: DC

## 2015-03-12 MED ORDER — AMPHETAMINE-DEXTROAMPHET ER 30 MG PO CP24: 30.0000 mg | ORAL_CAPSULE | ORAL | Status: DC

## 2015-03-13 ENCOUNTER — Encounter: Payer: Self-pay | Admitting: Nurse Practitioner

## 2015-03-13 LAB — GC/CHLAMYDIA PROBE AMP
CHLAMYDIA, DNA PROBE: NEGATIVE
Neisseria gonorrhoeae by PCR: NEGATIVE

## 2015-03-13 NOTE — Progress Notes (Signed)
Subjective:  Presents for c/o mid lower abd pain off/on x 1-2 months. Pain with urination. No urgency or frequency. Treated for urinary problems in Dec and Mar. Positive OTC pregnancy test end of March then had a normal menses beginning of April following this lasting about 7 days. Same sexual partner. No fever. No back pain. Some discharge with slight color at times, minimal odor. Nausea, no vomiting. BMs normal.   Objective:   BP 116/78 mmHg  Temp(Src) 97.8 F (36.6 C) (Oral)  Ht  (1.651 m)  Wt 244 lb 6.4 oz (110.859 kg)  BMI 40.67 kg/m2 NAD. Alert ,oriented. Lungs clear. No CVA or flank tenderness. Heart RRR. Abdomen obese, soft, non distended with minimal right pelvic area tenderness. External GU: no rashes or lesions. Vagina: large amount of thick slight yellow discharge. Cervix normal in appearance; no CMT. Bimanual exam no tenderness or obvious masses; limited due to abd girth. Results for orders placed or performed in visit on 03/12/15  hCG, serum, qualitative  Result Value Ref Range   hCG,Beta Subunit,Qual,Serum Negative Negative <6 mIU/mL  POCT urinalysis dipstick  Result Value Ref Range   Color, UA     Clarity, UA     Glucose, UA     Bilirubin, UA ++    Ketones, UA     Spec Grav, UA 1.025    Blood, UA     pH, UA 5.0    Protein, UA     Urobilinogen, UA     Nitrite, UA     Leukocytes, UA 4+   POCT urine pregnancy  Result Value Ref Range   Preg Test, Ur Negative   POCT Wet Prep with KOH  Result Value Ref Range   Trichomonas, UA Negative    Clue Cells Wet Prep HPF POC neg    Epithelial Wet Prep HPF POC pos    Yeast Wet Prep HPF POC     Bacteria Wet Prep HPF POC rare    RBC Wet Prep HPF POC neg    WBC Wet Prep HPF POC TNTC    KOH Prep POC Negative    pH, Wet Prep 4.5   POCT UA - Microscopic Only  Result Value Ref Range   WBC, Ur, HPF, POC TNTC    RBC, urine, microscopic occas    Bacteria, U Microscopic rare    Mucus, UA     Epithelial cells, urine per micros  rare    Crystals, Ur, HPF, POC     Casts, Ur, LPF, POC     Yeast, UA       Assessment: Dysuria - Plan: POCT urinalysis dipstick, GC/Chlamydia Probe Amp, POCT Wet Prep with KOH, POCT UA - Microscopic Only  Abnormal uterine bleeding - Plan: POCT urine pregnancy, hCG, serum, qualitative  Pelvic pain in female - Plan: POCT Wet Prep with KOH, POCT UA - Microscopic Only  Urinary tract infection without hematuria, site unspecified   Plan:  Meds ordered this encounter  Medications  . nitrofurantoin, macrocrystal-monohydrate, (MACROBID) 100 MG capsule    Sig: Take 1 capsule (100 mg total) by mouth 2 (two) times daily.    Dispense:  14 capsule    Refill:  0    Order Specific Question:  Supervising Provider    Answer:  Merlyn Albert [[id number]]  . amphetamine-dextroamphetamine (ADDERALL XR) 30 MG 24 hr capsule    Sig: Take 1 capsule (30 mg total) by mouth every morning.    Dispense:  14 capsule  Refill:  0    Order Specific Question:  Supervising Provider    Answer:  Merlyn AlbertLUKING, WILLIAM S [[id number]]   GC and CT pending. If negative, recommend trial of Metronidazole to see if this will clear up discharge. Discussed safe sex issues. Defers contraceptive.  Warning signs reviewed.  Return if symptoms worsen or fail to improve.

## 2015-03-14 ENCOUNTER — Other Ambulatory Visit: Payer: Self-pay | Admitting: Nurse Practitioner

## 2015-03-14 MED ORDER — METRONIDAZOLE 500 MG PO TABS: 500.0000 mg | ORAL_TABLET | Freq: Two times a day (BID) | ORAL | Status: DC

## 2015-03-17 ENCOUNTER — Telehealth: Payer: Self-pay | Admitting: Nurse Practitioner

## 2015-03-17 NOTE — Telephone Encounter (Signed)
Talked with patient and explained to her she needs to get the metronidazole and take it because it may help her symptoms. Linda JonesCarolyn recommended this last week. Told patient it was sent to the [facility] in [place]. Patient verbalized understanding and agreed.

## 2015-03-17 NOTE — Telephone Encounter (Signed)
Pt seen 4/27 an given macrobid but states she is still having issues She states she does not know what the flagly is an why its on her med  List she uses [facility]  [place]   Still having back pain, when urinating it's fine but still doesn't feel quite right   Low grade fever

## 2015-03-19 ENCOUNTER — Ambulatory Visit (INDEPENDENT_AMBULATORY_CARE_PROVIDER_SITE_OTHER): Payer: 59 | Admitting: Nurse Practitioner

## 2015-03-19 VITALS — BP 116/82 | Temp 98.4°F | Ht 65.0 in | Wt 243.8 lb

## 2015-03-19 DIAGNOSIS — M545 Low back pain, unspecified: Secondary | ICD-10-CM

## 2015-03-19 MED ORDER — NAPROXEN 500 MG PO TABS: 500.0000 mg | ORAL_TABLET | Freq: Two times a day (BID) | ORAL | Status: DC

## 2015-03-19 MED ORDER — CYCLOBENZAPRINE HCL 10 MG PO TABS: 10.0000 mg | ORAL_TABLET | Freq: Three times a day (TID) | ORAL | Status: DC | PRN

## 2015-03-19 NOTE — Patient Instructions (Signed)
Ice/heat application Icy hot smart relief TENS unit

## 2015-03-23 ENCOUNTER — Encounter: Payer: Self-pay | Admitting: Nurse Practitioner

## 2015-03-23 NOTE — Progress Notes (Signed)
Subjective:  Presents with complaints of localized low back pain for the past 5 days. Worse with bending. No specific history of injury. Minimal fever of 99. No dysuria urgency or frequency. Currently on metronidazole for BV. No nausea, vomiting 1. Slight constipation. Minimal abdominal pain. Objective:   BP 116/82 mmHg  Temp(Src) 98.4 F (36.9 C) (Oral)  Ht 5\' 5"  (1.651 m)  Wt 243 lb 12.8 oz (110.587 kg)  BMI 40.57 kg/m2 NAD. Alert, oriented. Lungs clear. Heart regular rate rhythm. Abdomen soft nondistended nontender. Mild tenderness to palpation of the lumbar area bilateral, SLR negative on left, slightly positive on the right. Reflexes normal limit lower extremities. Good active ROM of the back. See labs 4/27.  Assessment:  Bilateral low back pain without sciatica  Plan:  Meds ordered this encounter  Medications  . naproxen (NAPROSYN) 500 MG tablet    Sig: Take 1 tablet (500 mg total) by mouth 2 (two) times daily with a meal.    Dispense:  30 tablet    Refill:  0    Order Specific Question:  Supervising Provider    Answer:  Merlyn AlbertLUKING, WILLIAM S [[id number]]  . cyclobenzaprine (FLEXERIL) 10 MG tablet    Sig: Take 1 tablet (10 mg total) by mouth 3 (three) times daily as needed for muscle spasms.    Dispense:  30 tablet    Refill:  0    Order Specific Question:  Supervising Provider    Answer:  Merlyn AlbertLUKING, WILLIAM S [[id number]]   Encourage weight gain. Back exercises. Ice/heat applications. Expect gradual resolution. Call back if worsens or persists.

## 2015-04-24 ENCOUNTER — Telehealth: Payer: Self-pay | Admitting: Family Medicine

## 2015-04-24 MED ORDER — ADDERALL XR 30 MG PO CP24
30.0000 mg | ORAL_CAPSULE | Freq: Every day | ORAL | Status: DC
Start: 2015-04-24 — End: 2016-05-28

## 2015-04-24 NOTE — Telephone Encounter (Signed)
Please write new Rx for pt's amphetamine-dextroamphetamine (ADDERALL XR) 30 MG 24 hr capsule  Prior Auth was APPROVED by her [employer] coverage and expires 04/23/16 but it is for Name Brand Only  Please write Rx for dispense as written and notify pt when ready to pick up [phone]  (I left message on pt's voicemail that we would be writing new Rx and would call her when ready to pick up)

## 2015-04-24 NOTE — Telephone Encounter (Signed)
Rx printed and up front for patient pick up. Patient notified. 

## 2015-05-13 ENCOUNTER — Encounter: Payer: Self-pay | Admitting: Emergency Medicine

## 2015-05-13 ENCOUNTER — Ambulatory Visit
Admission: EM | Admit: 2015-05-13 | Discharge: 2015-05-13 | Disposition: A | Discharge status: Home / Self Care | Payer: 59 | Attending: Family Medicine | Admitting: Family Medicine

## 2015-05-13 DIAGNOSIS — J0100 Acute maxillary sinusitis, unspecified: Secondary | ICD-10-CM

## 2015-05-13 DIAGNOSIS — H65191 Other acute nonsuppurative otitis media, right ear: Secondary | ICD-10-CM

## 2015-05-13 MED ORDER — AMOXICILLIN-POT CLAVULANATE 875-125 MG PO TABS: 1.0000 | ORAL_TABLET | Freq: Two times a day (BID) | ORAL | Status: DC

## 2015-05-13 NOTE — ED Notes (Signed)
Patient states she has a runny nose and congestion x 2 to 3 weeks, coughing

## 2015-05-13 NOTE — ED Provider Notes (Signed)
CSN: [id number]     Arrival date & time 05/13/15  1235 History   First MD Initiated Contact with Patient 05/13/15 1324     Chief Complaint  Patient presents with  . Nasal Congestion   (Consider location/radiation/quality/duration/timing/severity/associated sxs/prior Treatment) Patient is a 24 y.o. female presenting with URI. The history is provided by the patient.  URI Presenting symptoms: congestion, cough, ear pain, facial pain and rhinorrhea   Presenting symptoms: no fatigue and no fever   Severity:  Moderate Onset quality:  Sudden Duration:  2 weeks Timing:  Constant Chronicity:  New Relieved by:  Nothing Associated symptoms: headaches and sinus pain   Associated symptoms: no arthralgias, no myalgias, no neck pain, no sneezing, no swollen glands and no wheezing   Risk factors comment:  Tobacco abuse   History reviewed. No pertinent past medical history. Past Surgical History  Procedure Laterality Date  . Myringotomy with tube placement     History reviewed. No pertinent family history. History  Substance Use Topics  . Smoking status: Current Every Day Smoker -- 0.50 packs/day  . Smokeless tobacco: Never Used  . Alcohol Use: Not on file   OB History    No data available     Review of Systems  Constitutional: Negative for fever and fatigue.  HENT: Positive for congestion, ear pain and rhinorrhea. Negative for sneezing.   Respiratory: Positive for cough. Negative for wheezing.   Musculoskeletal: Negative for myalgias, arthralgias and neck pain.  Neurological: Positive for headaches.    Allergies  Sulfa antibiotics and Buspar  Home Medications   Prior to Admission medications   Medication Sig Start Date End Date Taking? Authorizing Provider  albuterol (PROVENTIL HFA;VENTOLIN HFA) 108 (90 BASE) MCG/ACT inhaler Inhale into the lungs every 6 (six) hours as needed for wheezing or shortness of breath.   Yes Historical Provider, MD  citalopram (CELEXA) 40 MG tablet  Take 40 mg by mouth daily.   Yes Historical Provider, MD  fexofenadine (ALLEGRA) 180 MG tablet Take 180 mg by mouth daily.   Yes Historical Provider, MD  LORazepam (ATIVAN) 0.5 MG tablet Take 0.5 mg by mouth every 8 (eight) hours.   Yes Historical Provider, MD  amoxicillin-clavulanate (AUGMENTIN) 875-125 MG per tablet Take 1 tablet by mouth 2 (two) times daily. 05/13/15   Linda Mccallum, MD   BP 106/69 mmHg  Pulse 93  Temp(Src) 98.9 F (37.2 C) (Tympanic)  Ht  (1.651 m)  Wt 230 lb (104.327 kg)  BMI 38.27 kg/m2  SpO2 100% Physical Exam  Constitutional: She appears well-developed and well-nourished. No distress.  HENT:  Head: Normocephalic and atraumatic.  Right Ear: External ear and ear canal normal. Tympanic membrane is injected and bulging. A middle ear effusion is present.  Left Ear: Tympanic membrane, external ear and ear canal normal.  Nose: Mucosal edema and rhinorrhea present. No nose lacerations, sinus tenderness, nasal deformity, septal deviation or nasal septal hematoma. No epistaxis.  No foreign bodies. Right sinus exhibits maxillary sinus tenderness and frontal sinus tenderness. Left sinus exhibits maxillary sinus tenderness and frontal sinus tenderness.  Mouth/Throat: Uvula is midline, oropharynx is clear and moist and mucous membranes are normal. No oropharyngeal exudate.  Eyes: Conjunctivae and EOM are normal. Pupils are equal, round, and reactive to light. Right eye exhibits no discharge. Left eye exhibits no discharge. No scleral icterus.  Neck: Normal range of motion. Neck supple. No thyromegaly present.  Cardiovascular: Normal rate, regular rhythm and normal heart sounds.   Pulmonary/Chest: Effort  normal and breath sounds normal. No respiratory distress. She has no wheezes. She has no rales.  Lymphadenopathy:    She has no cervical adenopathy.  Neurological: She is alert.  Skin: No rash noted. She is not diaphoretic.  Nursing note and vitals reviewed.   ED Course   Procedures (including critical care time) Labs Review Labs Reviewed - No data to display  Imaging Review No results found.   MDM   1. Acute nonsuppurative otitis media of right ear   2. Acute maxillary sinusitis, recurrence not specified    Discharge Medication List as of 05/13/2015  1:41 PM    START taking these medications   Details  amoxicillin-clavulanate (AUGMENTIN) 875-125 MG per tablet Take 1 tablet by mouth 2 (two) times daily., Starting 05/13/2015, Until Discontinued, Normal      Plan: 1. diagnosis reviewed with patient 2. rx as per orders; risks, benefits, potential side effects reviewed with patient 3. Recommend supportive treatment with otc analgesics 4. F/u prn if symptoms worsen or don't improve    Linda Mccallumrlando Avier Jech, MD 05/13/15 1346

## 2015-06-05 ENCOUNTER — Ambulatory Visit (INDEPENDENT_AMBULATORY_CARE_PROVIDER_SITE_OTHER): Payer: 59 | Admitting: Family Medicine

## 2015-06-05 ENCOUNTER — Other Ambulatory Visit: Payer: Self-pay | Admitting: Family Medicine

## 2015-06-05 ENCOUNTER — Ambulatory Visit (HOSPITAL_COMMUNITY)
Admission: RE | Admit: 2015-06-05 | Discharge: 2015-06-05 | Disposition: A | Discharge status: Home / Self Care | Payer: 59 | Source: Ambulatory Visit | Attending: Family Medicine | Admitting: Family Medicine

## 2015-06-05 VITALS — Temp 97.8°F | Ht 65.0 in | Wt 248.0 lb

## 2015-06-05 DIAGNOSIS — J019 Acute sinusitis, unspecified: Secondary | ICD-10-CM

## 2015-06-05 DIAGNOSIS — R079 Chest pain, unspecified: Secondary | ICD-10-CM | POA: Diagnosis not present

## 2015-06-05 DIAGNOSIS — B9689 Other specified bacterial agents as the cause of diseases classified elsewhere: Secondary | ICD-10-CM

## 2015-06-05 DIAGNOSIS — J208 Acute bronchitis due to other specified organisms: Secondary | ICD-10-CM | POA: Diagnosis not present

## 2015-06-05 DIAGNOSIS — R0602 Shortness of breath: Secondary | ICD-10-CM | POA: Diagnosis not present

## 2015-06-05 DIAGNOSIS — R05 Cough: Secondary | ICD-10-CM

## 2015-06-05 DIAGNOSIS — R059 Cough, unspecified: Secondary | ICD-10-CM

## 2015-06-05 MED ORDER — HYDROCODONE-ACETAMINOPHEN 5-325 MG PO TABS: 1.0000 | ORAL_TABLET | Freq: Four times a day (QID) | ORAL | Status: DC | PRN

## 2015-06-05 MED ORDER — MELOXICAM 15 MG PO TABS: 15.0000 mg | ORAL_TABLET | Freq: Every day | ORAL | Status: DC

## 2015-06-05 MED ORDER — AMOXICILLIN-POT CLAVULANATE 875-125 MG PO TABS: 1.0000 | ORAL_TABLET | Freq: Two times a day (BID) | ORAL | Status: DC

## 2015-06-05 NOTE — Progress Notes (Signed)
   Subjective:    Patient ID: Linda [name], female    DOB: 1991/03/01, 24 y.o.   MRN: [id number]  Cough This is a new problem. Associated symptoms include ear pain and nasal congestion. Exacerbated by: advil, tylenol, inhaler. Treatments tried: advil, tylenol, inhaler.   Patient states started 3-4 days ago with head congestion sinus pressure drainage coughing chest wall pain discolored mucus. Denies sweats chills or high fever.   Review of Systems  HENT: Positive for ear pain.   Respiratory: Positive for cough.    no wheezing but having chest wall pain discomfort with certain movements and with cough     Objective:   Physical Exam  On examination lungs are clear head congestion noted ears normal throat normal not respiratory distress chest wall tenderness noted both left and right sternal border      Assessment & Plan:  Chest wall pain Acute bronchitis Possible acute rhinosinusitis Patient is a smoker is working to quit Patient advised to stay away from smoking X-ray pending/anti-inflammatory/home use of pain medicine short-term

## 2015-06-05 NOTE — Patient Instructions (Signed)
Smoking Cessation Quitting smoking is important to your health and has many advantages. However, it is not always easy to quit since nicotine is a very addictive drug. Oftentimes, people try 3 times or more before being able to quit. This document explains the best ways for you to prepare to quit smoking. Quitting takes hard work and a lot of effort, but you can do it. ADVANTAGES OF QUITTING SMOKING  You will live longer, feel better, and live better.  Your body will feel the impact of quitting smoking almost immediately.  Within 20 minutes, blood pressure decreases. Your pulse returns to its normal level.  After 8 hours, carbon monoxide levels in the blood return to normal. Your oxygen level increases.  After 24 hours, the chance of having a heart attack starts to decrease. Your breath, hair, and body stop smelling like smoke.  After 48 hours, damaged nerve endings begin to recover. Your sense of taste and smell improve.  After 72 hours, the body is virtually free of nicotine. Your bronchial tubes relax and breathing becomes easier.  After 2 to 12 weeks, lungs can hold more air. Exercise becomes easier and circulation improves.  The risk of having a heart attack, stroke, cancer, or lung disease is greatly reduced.  After 1 year, the risk of coronary heart disease is cut in half.  After 5 years, the risk of stroke falls to the same as a nonsmoker.  After 10 years, the risk of lung cancer is cut in half and the risk of other cancers decreases significantly.  After 15 years, the risk of coronary heart disease drops, usually to the level of a nonsmoker.  If you are pregnant, quitting smoking will improve your chances of having a healthy baby.  The people you live with, especially any children, will be healthier.  You will have extra money to spend on things other than cigarettes. QUESTIONS TO THINK ABOUT BEFORE ATTEMPTING TO QUIT You may want to talk about your answers with your  health care provider.  Why do you want to quit?  If you tried to quit in the past, what helped and what did not?  What will be the most difficult situations for you after you quit? How will you plan to handle them?  Who can help you through the tough times? Your family? Friends? A health care provider?  What pleasures do you get from smoking? What ways can you still get pleasure if you quit? Here are some questions to ask your health care provider:  How can you help me to be successful at quitting?  What medicine do you think would be best for me and how should I take it?  What should I do if I need more help?  What is smoking withdrawal like? How can I get information on withdrawal? GET READY  Set a quit date.  Change your environment by getting rid of all cigarettes, ashtrays, matches, and lighters in your home, car, or work. Do not let people smoke in your home.  Review your past attempts to quit. Think about what worked and what did not. GET SUPPORT AND ENCOURAGEMENT You have a better chance of being successful if you have help. You can get support in many ways.  Tell your family, friends, and coworkers that you are going to quit and need their support. Ask them not to smoke around you.  Get individual, group, or telephone counseling and support. Programs are available at local hospitals and health centers. Call   your local health department for information about programs in your area.  Spiritual beliefs and practices may help some smokers quit.  Download a "quit meter" on your computer to keep track of quit statistics, such as how long you have gone without smoking, cigarettes not smoked, and money saved.  Get a self-help book about quitting smoking and staying off tobacco. LEARN NEW SKILLS AND BEHAVIORS  Distract yourself from urges to smoke. Talk to someone, go for a walk, or occupy your time with a task.  Change your normal routine. Take a different route to work.  Drink tea instead of coffee. Eat breakfast in a different place.  Reduce your stress. Take a hot bath, exercise, or read a book.  Plan something enjoyable to do every day. Reward yourself for not smoking.  Explore interactive web-based programs that specialize in helping you quit. GET MEDICINE AND USE IT CORRECTLY Medicines can help you stop smoking and decrease the urge to smoke. Combining medicine with the above behavioral methods and support can greatly increase your chances of successfully quitting smoking.  Nicotine replacement therapy helps deliver nicotine to your body without the negative effects and risks of smoking. Nicotine replacement therapy includes nicotine gum, lozenges, inhalers, nasal sprays, and skin patches. Some may be available over-the-counter and others require a prescription.  Antidepressant medicine helps people abstain from smoking, but how this works is unknown. This medicine is available by prescription.  Nicotinic receptor partial agonist medicine simulates the effect of nicotine in your brain. This medicine is available by prescription. Ask your health care provider for advice about which medicines to use and how to use them based on your health history. Your health care provider will tell you what side effects to look out for if you choose to be on a medicine or therapy. Carefully read the information on the package. Do not use any other product containing nicotine while using a nicotine replacement product.  RELAPSE OR DIFFICULT SITUATIONS Most relapses occur within the first 3 months after quitting. Do not be discouraged if you start smoking again. Remember, most people try several times before finally quitting. You may have symptoms of withdrawal because your body is used to nicotine. You may crave cigarettes, be irritable, feel very hungry, cough often, get headaches, or have difficulty concentrating. The withdrawal symptoms are only temporary. They are strongest  when you first quit, but they will go away within 10-14 days. To reduce the chances of relapse, try to:  Avoid drinking alcohol. Drinking lowers your chances of successfully quitting.  Reduce the amount of caffeine you consume. Once you quit smoking, the amount of caffeine in your body increases and can give you symptoms, such as a rapid heartbeat, sweating, and anxiety.  Avoid smokers because they can make you want to smoke.  Do not let weight gain distract you. Many smokers will gain weight when they quit, usually less than 10 pounds. Eat a healthy diet and stay active. You can always lose the weight gained after you quit.  Find ways to improve your mood other than smoking. FOR MORE INFORMATION  www.smokefree.gov  Document Released: 10/26/2001 Document Revised: 03/18/2014 Document Reviewed: 02/10/2012 [employer] Patient Information 2015 [employer]. This information is not intended to replace advice given to you by your health care provider. Make sure you discuss any questions you have with your health care provider.  

## 2015-06-06 ENCOUNTER — Emergency Department
Admission: EM | Admit: 2015-06-06 | Discharge: 2015-06-06 | Disposition: A | Discharge status: Home / Self Care | Payer: 59 | Attending: Emergency Medicine | Admitting: Emergency Medicine

## 2015-06-06 ENCOUNTER — Encounter: Payer: Self-pay | Admitting: Emergency Medicine

## 2015-06-06 ENCOUNTER — Other Ambulatory Visit: Payer: Self-pay

## 2015-06-06 ENCOUNTER — Emergency Department: Payer: 59

## 2015-06-06 ENCOUNTER — Telehealth: Payer: Self-pay | Admitting: Family Medicine

## 2015-06-06 DIAGNOSIS — Z87891 Personal history of nicotine dependence: Secondary | ICD-10-CM | POA: Diagnosis not present

## 2015-06-06 DIAGNOSIS — R079 Chest pain, unspecified: Secondary | ICD-10-CM | POA: Diagnosis present

## 2015-06-06 DIAGNOSIS — Z791 Long term (current) use of non-steroidal anti-inflammatories (NSAID): Secondary | ICD-10-CM | POA: Diagnosis not present

## 2015-06-06 DIAGNOSIS — J45901 Unspecified asthma with (acute) exacerbation: Secondary | ICD-10-CM

## 2015-06-06 DIAGNOSIS — Z79899 Other long term (current) drug therapy: Secondary | ICD-10-CM | POA: Diagnosis not present

## 2015-06-06 LAB — CBC
HEMATOCRIT: 36.3 % (ref 35.0–47.0)
Hemoglobin: 11.9 g/dL — ABNORMAL LOW (ref 12.0–16.0)
MCH: 27.7 pg (ref 26.0–34.0)
MCHC: 32.7 g/dL (ref 32.0–36.0)
MCV: 84.5 fL (ref 80.0–100.0)
Platelets: 290 10*3/uL (ref 150–440)
RBC: 4.29 MIL/uL (ref 3.80–5.20)
RDW: 13.7 % (ref 11.5–14.5)
WBC: 10.4 10*3/uL (ref 3.6–11.0)

## 2015-06-06 LAB — BASIC METABOLIC PANEL
Anion gap: 9 (ref 5–15)
BUN: 7 mg/dL (ref 6–20)
CO2: 24 mmol/L (ref 22–32)
CREATININE: 0.78 mg/dL (ref 0.44–1.00)
Calcium: 8.9 mg/dL (ref 8.9–10.3)
Chloride: 104 mmol/L (ref 101–111)
GLUCOSE: 104 mg/dL — AB (ref 65–99)
POTASSIUM: 3.8 mmol/L (ref 3.5–5.1)
Sodium: 137 mmol/L (ref 135–145)

## 2015-06-06 MED ORDER — IPRATROPIUM-ALBUTEROL 0.5-2.5 (3) MG/3ML IN SOLN
3.0000 mL | Freq: Once | RESPIRATORY_TRACT | Status: AC
  Administered 2015-06-06: 3 mL via RESPIRATORY_TRACT
  Filled 2015-06-06: qty 3

## 2015-06-06 MED ORDER — ALBUTEROL SULFATE HFA 108 (90 BASE) MCG/ACT IN AERS: 2.0000 | INHALATION_SPRAY | RESPIRATORY_TRACT | Status: DC | PRN

## 2015-06-06 MED ORDER — ALBUTEROL SULFATE HFA 108 (90 BASE) MCG/ACT IN AERS: 2.0000 | INHALATION_SPRAY | Freq: Four times a day (QID) | RESPIRATORY_TRACT | Status: DC | PRN

## 2015-06-06 MED ORDER — TRAMADOL HCL 50 MG PO TABS: ORAL_TABLET | ORAL | Status: DC

## 2015-06-06 NOTE — Telephone Encounter (Signed)
Spoke with patient and informed patient to stop use of hydrocodone per Dr.Scott and informed her that Dr.Scott ordered tramadol that will be sent to [facility] in [place]. Also informed patient per Dr.Scott that if itching or reaction occurs to tramadol to D/c use. Hydrocodone d/c'd in epic.

## 2015-06-06 NOTE — ED Notes (Signed)
Patient presents to the ED with chest pain that is worse when she breathes of coughs.  Patient is complaining of pain to the right side and center of her chest.  Patient reports history of asthma.  Patient states she was seen by her PCP yesterday and was told she had "inflammation of the lungs" and was started on antibiotics.

## 2015-06-06 NOTE — Telephone Encounter (Signed)
#  1 discontinue medication #2 may try tramadol 50 mg 1 taken  3 times a day when necessary pain, #21 with one refill

## 2015-06-06 NOTE — ED Notes (Signed)
Pt sig other requesting to get in bed to warm pt, this RN consents

## 2015-06-06 NOTE — ED Provider Notes (Signed)
[facility] Emergency Department Provider Note  Time seen: 11:13 PM  I have reviewed the triage vital signs and the nursing notes.   HISTORY  Chief Complaint Chest Pain    HPI Linda [name] is a 24 y.o. female with a past medical history of asthma who presents the emergency department with shortness of breath and some chest pain. According to the patient for the past 2-3 days she has been coughing, feeling some shortness of breath, and having some intermittent chest pain.Patient describes the shortness of breath is mild to moderate, somewhat worse with exertion. Patient describes her chest pain is intermittent, somewhat worse when she breathes deeply or coughs. Chest pain as mild as well. Denies any fever, sputum production, recent leg pain or swelling.     Past Medical History  Diagnosis Date  . Asthma   . IBS (irritable bowel syndrome)   . Heart murmur   . Interstitial cystitis   . Anxiety     Patient Active Problem List   Diagnosis Date Noted  . Hypothyroidism 06/21/2014  . Interstitial cystitis 06/14/2014  . 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 Morbid obesity 02/07/2014  . 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 Asthma, chronic 04/15/2013  . Chronic anxiety 04/15/2013  . Cervical strain 03/07/2012  . Sprain and strain of unspecified site of shoulder and upper arm 03/07/2012  . Pain in joint, shoulder region 03/07/2012    Past Surgical History  Procedure Laterality Date  . Upper gi endoscopy    . Tympanostomy tube placement    . No past surgeries    . Cesarean section N/A 03/20/2013    Procedure: CESAREAN SECTION;  Surgeon: [name], MD;  Location: [facility];  Service: Obstetrics;  Laterality: N/A;  . Cesarean section      Current Outpatient Rx  Name  Route  Sig  Dispense  Refill  . ADDERALL XR 30 MG 24 hr capsule   Oral   Take 1 capsule (30 mg total) by mouth daily.   30 capsule   0    Dispense as written.    BRAND NAME ONLY   . albuterol (PROVENTIL HFA;VENTOLIN HFA) 108 (90 BASE) MCG/ACT inhaler   Inhalation   Inhale 2 puffs into the lungs every 4 (four) hours as needed for wheezing. For shortness of breath/asthma   1 Inhaler   5   . amoxicillin-clavulanate (AUGMENTIN) 875-125 MG per tablet   Oral   Take 1 tablet by mouth 2 (two) times daily.   20 tablet   0   . citalopram (CELEXA) 40 MG tablet   Oral   Take 1 tablet (40 mg total) by mouth daily.   90 tablet   1   . cyclobenzaprine (FLEXERIL) 10 MG tablet   Oral   Take 1 tablet (10 mg total) by mouth 3 (three) times daily as needed for muscle spasms.   30 tablet   0   . LORazepam (ATIVAN) 0.5 MG tablet   Oral   Take 0.5 mg by mouth every 6 (six) hours as needed for anxiety.         . meloxicam (MOBIC) 15 MG tablet      TAKE 1 TABLET(15 MG) BY MOUTH DAILY   30 tablet   0     **Patient requests 90 days supply**   . metroNIDAZOLE (FLAGYL) 500 MG tablet   Oral   Take 1 tablet (500 mg total) by mouth 2 (two) times daily  with a meal.   14 tablet   0   . naproxen (NAPROSYN) 500 MG tablet   Oral   Take 1 tablet (500 mg total) by mouth 2 (two) times daily with a meal.   30 tablet   0   . nitrofurantoin, macrocrystal-monohydrate, (MACROBID) 100 MG capsule   Oral   Take 1 capsule (100 mg total) by mouth 2 (two) times daily.   14 capsule   0   . ondansetron (ZOFRAN ODT) 8 MG disintegrating tablet   Oral   Take 1 tablet (8 mg total) by mouth every 8 (eight) hours as needed for nausea or vomiting.   20 tablet   1   . traMADol (ULTRAM) 50 MG tablet      Take 1 tablet three times a day as needed for pain   21 tablet   1     Allergies Buspar; Hydrocodone; and Sulfa antibiotics  Family History  Problem Relation Age of Onset  . Cancer Paternal Uncle   . COPD Maternal Grandfather   . Stroke Paternal Grandfather     Social History History  Substance Use Topics  . Smoking status:  Former Smoker -- 0.50 packs/day    Types: Cigarettes    Start date: 01/28/2013  . Smokeless tobacco: Never Used  . Alcohol Use: No    Review of Systems Constitutional: Negative for fever. Cardiovascular: positive for intermittent chest pain. Respiratory: Positive for shortness of breath.  Gastrointestinal: Negative for abdominal pain, vomiting and diarrhea. Neurological: Negative for headache 10-point ROS otherwise negative.  ____________________________________________   PHYSICAL EXAM:  VITAL SIGNS: ED Triage Vitals  Enc Vitals Group     BP 06/06/15 1843 119/62 mmHg     Pulse Rate 06/06/15 1840 84     Resp 06/06/15 1840 24     Temp 06/06/15 1840 99.1 F (37.3 C)     Temp Source 06/06/15 1840 Oral     SpO2 06/06/15 1843 97 %     Weight 06/06/15 1840 248 lb (112.492 kg)     Height 06/06/15 1840 5' 5.5" (1.664 m)     Head Cir --      Peak Flow --      Pain Score 06/06/15 1842 8     Pain Loc --      Pain Edu? --      Excl. in GC? --     Constitutional: Alert and oriented. Well appearing and in no distress. ENT   Mouth/Throat: Mucous membranes are moist. Cardiovascular: Normal rate, regular rhythm. No murmur Respiratory: Normal respiratory effort without tachypnea nor retractions. Mild expiratory wheeze bilaterally. Gastrointestinal: Soft and nontender. No distention.  Musculoskeletal: Nontender with normal range of motion in all extremities. No lower extremity tenderness or edema. Neurologic:  Normal speech and language. No gross focal neurologic deficits  Skin:  Skin is warm, dry and intact.   Psychiatric: Mood and affect are normal. Speech and behavior are normal.   ____________________________________________    EKG  EKG reviewed and interpreted by myself shows normal sinus rhythm at 79 bpm, narrow QRS, normal axis, normal intervals, no ST changes noted.  ____________________________________________    RADIOLOGY  Chest x-ray shows no acute  abnormalities.  ____________________________________________    INITIAL IMPRESSION / ASSESSMENT AND PLAN / ED COURSE  Pertinent labs & imaging results that were available during my care of the patient were reviewed by me and considered in my medical decision making (see chart for details).  Patient with 2-3 days  of cough, congestion, shortness of breath, and some chest pain intermittently. Patient's EKG is unremarkable, chest x-ray is clear, labs are largely within normal limits. Patient has mild expiratory wheeze bilaterally, we will treat with a DuoNeb. Patient has no albuterol inhaler, she is requesting a refill. Patient was also placed on antibiotics yesterday by her primary care doctor. No lower extremity swelling or pain noted. Normal heart rate, normal vitals, do not suspect PE clinically. We will discharge the patient with albuterol, and primary care follow-up on Monday if not better  ____________________________________________   FINAL CLINICAL IMPRESSION(S) / ED DIAGNOSES  Asthma exacerbation Chest pain   Minna Antis, MD 06/06/15 2326

## 2015-06-06 NOTE — Discharge Instructions (Signed)
Asthma °Asthma is a recurring condition in which the airways tighten and narrow. Asthma can make it difficult to breathe. It can cause coughing, wheezing, and shortness of breath. Asthma episodes, also called asthma attacks, range from minor to life-threatening. Asthma cannot be cured, but medicines and lifestyle changes can help control it. °CAUSES °Asthma is believed to be caused by inherited (genetic) and environmental factors, but its exact cause is unknown. Asthma may be triggered by allergens, lung infections, or irritants in the air. Asthma triggers are different for each person. Common triggers include:  °· Animal dander. °· Dust mites. °· Cockroaches. °· Pollen from trees or grass. °· Mold. °· Smoke. °· Air pollutants such as dust, household cleaners, hair sprays, aerosol sprays, paint fumes, strong chemicals, or strong odors. °· Cold air, weather changes, and winds (which increase molds and pollens in the air). °· Strong emotional expressions such as crying or laughing hard. °· Stress. °· Certain medicines (such as aspirin) or types of drugs (such as beta-blockers). °· Sulfites in foods and drinks. Foods and drinks that may contain sulfites include dried fruit, potato chips, and sparkling grape juice. °· Infections or inflammatory conditions such as the flu, a cold, or an inflammation of the nasal membranes (rhinitis). °· Gastroesophageal reflux disease (GERD). °· Exercise or strenuous activity. °SYMPTOMS °Symptoms may occur immediately after asthma is triggered or many hours later. Symptoms include: °· Wheezing. °· Excessive nighttime or early morning coughing. °· Frequent or severe coughing with a common cold. °· Chest tightness. °· Shortness of breath. °DIAGNOSIS  °The diagnosis of asthma is made by a review of your medical history and a physical exam. Tests may also be performed. These may include: °· Lung function studies. These tests show how much air you breathe in and out. °· Allergy  tests. °· Imaging tests such as X-rays. °TREATMENT  °Asthma cannot be cured, but it can usually be controlled. Treatment involves identifying and avoiding your asthma triggers. It also involves medicines. There are 2 classes of medicine used for asthma treatment:  °· Controller medicines. These prevent asthma symptoms from occurring. They are usually taken every day. °· Reliever or rescue medicines. These quickly relieve asthma symptoms. They are used as needed and provide short-term relief. °Your health care provider will help you create an asthma action plan. An asthma action plan is a written plan for managing and treating your asthma attacks. It includes a list of your asthma triggers and how they may be avoided. It also includes information on when medicines should be taken and when their dosage should be changed. An action plan may also involve the use of a device called a peak flow meter. A peak flow meter measures how well the lungs are working. It helps you monitor your condition. °HOME CARE INSTRUCTIONS  °· Take medicines only as directed by your health care provider. Speak with your health care provider if you have questions about how or when to take the medicines. °· Use a peak flow meter as directed by your health care provider. Record and keep track of readings. °· Understand and use the action plan to help minimize or stop an asthma attack without needing to seek medical care. °· Control your home environment in the following ways to help prevent asthma attacks: °¨ Do not smoke. Avoid being exposed to secondhand smoke. °¨ Change your heating and air conditioning filter regularly. °¨ Limit your use of fireplaces and wood stoves. °¨ Get rid of pests (such as roaches and   mice) and their droppings.  Throw away plants if you see mold on them.  Clean your floors and dust regularly. Use unscented cleaning products.  Try to have someone else vacuum for you regularly. Stay out of rooms while they are  being vacuumed and for a short while afterward. If you vacuum, use a dust mask from a hardware store, a double-layered or microfilter vacuum cleaner bag, or a vacuum cleaner with a HEPA filter.  Replace carpet with wood, tile, or vinyl flooring. Carpet can trap dander and dust.  Use allergy-proof pillows, mattress covers, and box spring covers.  Wash bed sheets and blankets every week in hot water and dry them in a dryer.  Use blankets that are made of polyester or cotton.  Clean bathrooms and kitchens with bleach. If possible, have someone repaint the walls in these rooms with mold-resistant paint. Keep out of the rooms that are being cleaned and painted.  Wash hands frequently. SEEK MEDICAL CARE IF:   You have wheezing, shortness of breath, or a cough even if taking medicine to prevent attacks.  The colored mucus you cough up (sputum) is thicker than usual.  Your sputum changes from clear or white to yellow, green, gray, or bloody.  You have any problems that may be related to the medicines you are taking (such as a rash, itching, swelling, or trouble breathing).  You are using a reliever medicine more than 2-3 times per week.  Your peak flow is still at 50-79% of your personal best after following your action plan for 1 hour.  You have a fever. SEEK IMMEDIATE MEDICAL CARE IF:   You seem to be getting worse and are unresponsive to treatment during an asthma attack.  You are short of breath even at rest.  You get short of breath when doing very little physical activity.  You have difficulty eating, drinking, or talking due to asthma symptoms.  You develop chest pain.  You develop a fast heartbeat.  You have a bluish color to your lips or fingernails.  You are light-headed, dizzy, or faint.  Your peak flow is less than 50% of your personal best. MAKE SURE YOU:   Understand these instructions.  Will watch your condition.  Will get help right away if you are not  doing well or get worse. Document Released: 11/01/2005 Document Revised: 03/18/2014 Document Reviewed: 05/31/2013 [facility] Patient Information 2015 [place], [place]. This information is not intended to replace advice given to you by your health care provider. Make sure you discuss any questions you have with your health care provider.  Chest Wall Pain Chest wall pain is pain in or around the bones and muscles of your chest. It may take up to 6 weeks to get better. It may take longer if you must stay physically active in your work and activities.  CAUSES  Chest wall pain may happen on its own. However, it may be caused by:  A viral illness like the flu.  Injury.  Coughing.  Exercise.  Arthritis.  Fibromyalgia.  Shingles. HOME CARE INSTRUCTIONS   Avoid overtiring physical activity. Try not to strain or perform activities that cause pain. This includes any activities using your chest or your abdominal and side muscles, especially if heavy weights are used.  Put ice on the sore area.  Put ice in a plastic bag.  Place a towel between your skin and the bag.  Leave the ice on for 15-20 minutes per hour while awake for the first  2 days.  Only take over-the-counter or prescription medicines for pain, discomfort, or fever as directed by your caregiver. SEEK IMMEDIATE MEDICAL CARE IF:   Your pain increases, or you are very uncomfortable.  You have a fever.  Your chest pain becomes worse.  You have new, unexplained symptoms.  You have nausea or vomiting.  You feel sweaty or lightheaded.  You have a cough with phlegm (sputum), or you cough up blood. MAKE SURE YOU:   Understand these instructions.  Will watch your condition.  Will get help right away if you are not doing well or get worse. Document Released: 11/01/2005 Document Revised: 01/24/2012 Document Reviewed: 06/28/2011 [facility] Patient Information 2015 [place], [place]. This information is not intended to replace  advice given to you by your health care provider. Make sure you discuss any questions you have with your health care provider.     Please use her albuterol inhaler as needed, as prescribed. Please follow up with her primary care doctor in the next 2-3 days for reevaluation. Please return to the emergency department for any worsening chest pain, worsening shortness breath, or any other symptom personally concerning to yourself.

## 2015-06-06 NOTE — Telephone Encounter (Signed)
This medication is not intended for long-term use. It may be increased from 15 tablets to 30 tablets. I cannot authorize 90 day.

## 2015-06-06 NOTE — Telephone Encounter (Signed)
If tramadol causes itching then stop it as well

## 2015-06-06 NOTE — Telephone Encounter (Signed)
Pt called stating that the pain medicine that she was prescribed made her jittery and made her itch. Pt states it is not working. Please advise.

## 2015-06-06 NOTE — ED Notes (Signed)
Patient with no complaints at this time. Respirations even and unlabored. Skin warm/dry. Discharge instructions reviewed with patient at this time. Patient given opportunity to voice concerns/ask questions. Patient discharged at this time and left Emergency Department, via wheelchair.   

## 2015-06-07 LAB — TROPONIN I: Troponin I: <0.03 ng/mL (ref <0.031)

## 2015-06-09 ENCOUNTER — Ambulatory Visit (HOSPITAL_COMMUNITY)
Admission: RE | Admit: 2015-06-09 | Discharge: 2015-06-09 | Disposition: A | Discharge status: Home / Self Care | Payer: 59 | Source: Ambulatory Visit | Attending: Family Medicine | Admitting: Family Medicine

## 2015-06-09 ENCOUNTER — Ambulatory Visit (INDEPENDENT_AMBULATORY_CARE_PROVIDER_SITE_OTHER): Payer: 59 | Admitting: Family Medicine

## 2015-06-09 ENCOUNTER — Telehealth: Payer: Self-pay | Admitting: Family Medicine

## 2015-06-09 ENCOUNTER — Encounter: Payer: Self-pay | Admitting: Family Medicine

## 2015-06-09 VITALS — BP 112/76 | Temp 97.8°F | Ht 65.0 in | Wt 245.0 lb

## 2015-06-09 DIAGNOSIS — R05 Cough: Secondary | ICD-10-CM | POA: Diagnosis present

## 2015-06-09 DIAGNOSIS — J189 Pneumonia, unspecified organism: Secondary | ICD-10-CM | POA: Diagnosis not present

## 2015-06-09 MED ORDER — AMOXICILLIN-POT CLAVULANATE ER 1000-62.5 MG PO TB12: 2.0000 | ORAL_TABLET | Freq: Two times a day (BID) | ORAL | Status: DC

## 2015-06-09 MED ORDER — BENZONATATE 100 MG PO CAPS: 100.0000 mg | ORAL_CAPSULE | Freq: Three times a day (TID) | ORAL | Status: DC | PRN

## 2015-06-09 MED ORDER — AZITHROMYCIN 250 MG PO TABS: ORAL_TABLET | ORAL | Status: DC

## 2015-06-09 MED ORDER — HYDROCODONE-HOMATROPINE 5-1.5 MG/5ML PO SYRP: ORAL_SOLUTION | ORAL | Status: DC

## 2015-06-09 NOTE — Addendum Note (Signed)
Addended by: Lilyan Punt A on: 06/09/2015 09:04 PM   Modules accepted: Orders

## 2015-06-09 NOTE — Progress Notes (Addendum)
   Subjective:    Patient ID: Linda [name], female    DOB: 02/14/1991, 24 y.o.   MRN: [id number]  Cough This is a new problem. The current episode started in the past 7 days. The problem has been unchanged. The cough is non-productive. Associated symptoms include shortness of breath and wheezing. Nothing aggravates the symptoms. Treatments tried: Augmentin  The treatment provided no relief.   Patient relates that she is having a lot of coughing congestion feels a little short of breath went to the ER over the weekend diagnosed with reactive airway they did x-ray told her it was normal she relates increased coughing   Review of Systems  Respiratory: Positive for cough, shortness of breath and wheezing.        Objective:   Physical Exam  There are crackles in the right base she is not respiratory distress I find no evidence of crackles in other areas. Heart regular HEENT benign  Minimal wheezing    Assessment & Plan:  Community-acquired pneumonia-may need to add Zithromax check chest x-ray Tessalon for cough albuterol when necessary patient counseled to quit smoking  I spoke with pt about her xray, pt will stop celexa for 3 days and take zithromax, pt will double Augmewntin tonight and start stronger dose in the am ,pt to f/u ov on Thursday if not better, finally will do f/u xray in 3 weeks, pt knows to go to ER if worse

## 2015-06-09 NOTE — Telephone Encounter (Signed)
Patient was seen 7/21 by Dr. Lorin Picket . Has really bad cough and wants something called in to [facility].

## 2015-06-09 NOTE — Telephone Encounter (Signed)
Pt is having fever around 100. Taking tylenol. Wheezing. Using inhaler every 2 hours. Consult with Dr.Scott. Pt to come in at 11:30 today for recheck.

## 2015-06-10 NOTE — Addendum Note (Signed)
Addended by: Margaretha Sheffield on: 06/10/2015 08:41 AM   Modules accepted: Orders

## 2015-06-27 ENCOUNTER — Other Ambulatory Visit: Payer: Self-pay

## 2015-06-27 ENCOUNTER — Telehealth: Payer: Self-pay | Admitting: Family Medicine

## 2015-06-27 MED ORDER — DOXYCYCLINE HYCLATE 100 MG PO TABS: 100.0000 mg | ORAL_TABLET | Freq: Two times a day (BID) | ORAL | Status: DC

## 2015-06-27 NOTE — Telephone Encounter (Signed)
Seen a couple weeks ago, dx'd with pneumonia, still has cough, green stuff coming up, rattly with breathing, finished antibiotics 3 days ago Suggestions?  [facility]

## 2015-06-27 NOTE — Telephone Encounter (Signed)
Per Dr. Lorin Picket- Doxycycline 100 mg BID x 10 days, Avoid excessive sunlight, Recheck if worse or start running fever. Patient verbalized understanding. Med sent to pharmacy.

## 2015-07-11 ENCOUNTER — Ambulatory Visit (HOSPITAL_COMMUNITY)
Admission: RE | Admit: 2015-07-11 | Discharge: 2015-07-11 | Disposition: A | Discharge status: Home / Self Care | Payer: 59 | Source: Ambulatory Visit | Attending: Family Medicine | Admitting: Family Medicine

## 2015-07-11 ENCOUNTER — Other Ambulatory Visit: Payer: Self-pay | Admitting: Nurse Practitioner

## 2015-07-11 DIAGNOSIS — J189 Pneumonia, unspecified organism: Secondary | ICD-10-CM | POA: Diagnosis present

## 2015-07-14 ENCOUNTER — Telehealth: Payer: Self-pay | Admitting: Family Medicine

## 2015-07-14 NOTE — Telephone Encounter (Signed)
Rx was sent to pharmacy and confirmed 07/14/15 @ 1:25pm. Patient notified.

## 2015-07-14 NOTE — Telephone Encounter (Signed)
Pt is needing a refill on her celexa pt has been out since last Thursday.   [facility]

## 2015-07-14 NOTE — Telephone Encounter (Signed)
Ok plus three ref 

## 2015-07-23 ENCOUNTER — Encounter: Payer: Self-pay | Admitting: Family Medicine

## 2015-09-04 ENCOUNTER — Emergency Department
Admission: EM | Admit: 2015-09-04 | Discharge: 2015-09-04 | Disposition: A | Discharge status: Home / Self Care | Payer: 59 | Attending: Emergency Medicine | Admitting: Emergency Medicine

## 2015-09-04 ENCOUNTER — Emergency Department: Payer: 59

## 2015-09-04 ENCOUNTER — Encounter: Payer: Self-pay | Admitting: *Deleted

## 2015-09-04 DIAGNOSIS — Z79899 Other long term (current) drug therapy: Secondary | ICD-10-CM | POA: Insufficient documentation

## 2015-09-04 DIAGNOSIS — Z792 Long term (current) use of antibiotics: Secondary | ICD-10-CM | POA: Diagnosis not present

## 2015-09-04 DIAGNOSIS — J069 Acute upper respiratory infection, unspecified: Secondary | ICD-10-CM | POA: Insufficient documentation

## 2015-09-04 DIAGNOSIS — Z791 Long term (current) use of non-steroidal anti-inflammatories (NSAID): Secondary | ICD-10-CM | POA: Insufficient documentation

## 2015-09-04 DIAGNOSIS — Z87891 Personal history of nicotine dependence: Secondary | ICD-10-CM | POA: Diagnosis not present

## 2015-09-04 DIAGNOSIS — R05 Cough: Secondary | ICD-10-CM | POA: Diagnosis present

## 2015-09-04 MED ORDER — OXYMETAZOLINE HCL 0.05 % NA SOLN
1.0000 | Freq: Once | NASAL | Status: AC
  Administered 2015-09-04: 1 via NASAL
  Filled 2015-09-04: qty 15

## 2015-09-04 NOTE — ED Notes (Signed)
Pt reports a cough for 3 days.  Pt reports coughing up green phlegm.  cig smoker.  Hx of pneumonia   Pt also has sinus congestion.  Took allegra without relief today.

## 2015-09-04 NOTE — Discharge Instructions (Signed)
Please use Afrin 2 sprays twice daily into each nostril for the next 4-5 days, as needed. Then discontinue use. Please use Tylenol and Motrin every 8 hours as needed for fever or discomfort, as written on the box. Drink plenty of fluids. Follow-up with your primary care physician in 2-3 days if not improved. Return to the emergency department for any chest pain, worsening symptoms, or any other symptom personally concerning to your self.   Upper Respiratory Infection, Adult Most upper respiratory infections (URIs) are a viral infection of the air passages leading to the lungs. A URI affects the nose, throat, and upper air passages. The most common type of URI is nasopharyngitis and is typically referred to as "the common cold." URIs run their course and usually go away on their own. Most of the time, a URI does not require medical attention, but sometimes a bacterial infection in the upper airways can follow a viral infection. This is called a secondary infection. Sinus and middle ear infections are common types of secondary upper respiratory infections. Bacterial pneumonia can also complicate a URI. A URI can worsen asthma and chronic obstructive pulmonary disease (COPD). Sometimes, these complications can require emergency medical care and may be life threatening.  CAUSES Almost all URIs are caused by viruses. A virus is a type of germ and can spread from one person to another.  RISKS FACTORS You may be at risk for a URI if:   You smoke.   You have chronic heart or lung disease.  You have a weakened defense (immune) system.   You are very young or very old.   You have nasal allergies or asthma.  You work in crowded or poorly ventilated areas.  You work in health care facilities or schools. SIGNS AND SYMPTOMS  Symptoms typically develop 2-3 days after you come in contact with a cold virus. Most viral URIs last 7-10 days. However, viral URIs from the influenza virus (flu virus) can last  14-18 days and are typically more severe. Symptoms may include:   Runny or stuffy (congested) nose.   Sneezing.   Cough.   Sore throat.   Headache.   Fatigue.   Fever.   Loss of appetite.   Pain in your forehead, behind your eyes, and over your cheekbones (sinus pain).  Muscle aches.  DIAGNOSIS  Your health care provider may diagnose a URI by:  Physical exam.  Tests to check that your symptoms are not due to another condition such as:  Strep throat.  Sinusitis.  Pneumonia.  Asthma. TREATMENT  A URI goes away on its own with time. It cannot be cured with medicines, but medicines may be prescribed or recommended to relieve symptoms. Medicines may help:  Reduce your fever.  Reduce your cough.  Relieve nasal congestion. HOME CARE INSTRUCTIONS   Take medicines only as directed by your health care provider.   Gargle warm saltwater or take cough drops to comfort your throat as directed by your health care provider.  Use a warm mist humidifier or inhale steam from a shower to increase air moisture. This may make it easier to breathe.  Drink enough fluid to keep your urine clear or pale yellow.   Eat soups and other clear broths and maintain good nutrition.   Rest as needed.   Return to work when your temperature has returned to normal or as your health care provider advises. You may need to stay home longer to avoid infecting others. You can also use  a face mask and careful hand washing to prevent spread of the virus.  Increase the usage of your inhaler if you have asthma.   Do not use any tobacco products, including cigarettes, chewing tobacco, or electronic cigarettes. If you need help quitting, ask your health care provider. PREVENTION  The best way to protect yourself from getting a cold is to practice good hygiene.   Avoid oral or hand contact with people with cold symptoms.   Wash your hands often if contact occurs.  There is no clear  evidence that vitamin C, vitamin E, echinacea, or exercise reduces the chance of developing a cold. However, it is always recommended to get plenty of rest, exercise, and practice good nutrition.  SEEK MEDICAL CARE IF:   You are getting worse rather than better.   Your symptoms are not controlled by medicine.   You have chills.  You have worsening shortness of breath.  You have brown or red mucus.  You have yellow or brown nasal discharge.  You have pain in your face, especially when you bend forward.  You have a fever.  You have swollen neck glands.  You have pain while swallowing.  You have white areas in the back of your throat. SEEK IMMEDIATE MEDICAL CARE IF:   You have severe or persistent:  Headache.  Ear pain.  Sinus pain.  Chest pain.  You have chronic lung disease and any of the following:  Wheezing.  Prolonged cough.  Coughing up blood.  A change in your usual mucus.  You have a stiff neck.  You have changes in your:  Vision.  Hearing.  Thinking.  Mood. MAKE SURE YOU:   Understand these instructions.  Will watch your condition.  Will get help right away if you are not doing well or get worse.   This information is not intended to replace advice given to you by your health care provider. Make sure you discuss any questions you have with your health care provider.   Document Released: 04/27/2001 Document Revised: 03/18/2015 Document Reviewed: 02/06/2014 Elsevier Interactive Patient Education [employer]

## 2015-09-04 NOTE — ED Provider Notes (Signed)
[facility] Emergency Department Provider Note  Time seen: 2:19 AM  I have reviewed the triage vital signs and the nursing notes.   HISTORY  Chief Complaint Cough and Nasal Congestion    HPI Linda [name] is a 24 y.o. female with a past medical history of asthma who presents the emergency department with 3 days of cough. According to the patient she has been coughing for 3 days occasionally getting up a yellow/green sputum. She states the main reason she came today which is significant sinus congestion. Patient states she has not been able to breathe out of her nose for 2 days. Has started to hear popping in her ears, but denies any ear pain. Denies any fever.    Past Medical History  Diagnosis Date  . Asthma   . IBS (irritable bowel syndrome)   . Heart murmur   . Interstitial cystitis   . Anxiety     Patient Active Problem List   Diagnosis Date Noted  . Hypothyroidism 06/21/2014  . Interstitial cystitis 06/14/2014  . 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 Morbid obesity (HCC) 02/07/2014  . 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 Asthma, chronic 04/15/2013  . Chronic anxiety 04/15/2013  . Cervical strain 03/07/2012  . Sprain and strain of unspecified site of shoulder and upper arm 03/07/2012  . Pain in joint, shoulder region 03/07/2012    Past Surgical History  Procedure Laterality Date  . Myringotomy with tube placement    . Upper gi endoscopy    . Tympanostomy tube placement    . No past surgeries    . Cesarean section N/A 03/20/2013    Procedure: CESAREAN SECTION;  Surgeon: Oliver PilaKathy W Richardson, MD;  Location: [facility] ORS;  Service: Obstetrics;  Laterality: N/A;  . Cesarean section      Current Outpatient Rx  Name  Route  Sig  Dispense  Refill  . ADDERALL XR 30 MG 24 hr capsule   Oral   Take 1 capsule (30 mg total) by mouth daily.   30 capsule   0     Dispense as written.    BRAND NAME ONLY   . albuterol  (PROVENTIL HFA;VENTOLIN HFA) 108 (90 BASE) MCG/ACT inhaler   Inhalation   Inhale 2 puffs into the lungs every 6 (six) hours as needed for wheezing or shortness of breath.   1 Inhaler   2   . albuterol (PROVENTIL HFA;VENTOLIN HFA) 108 (90 BASE) MCG/ACT inhaler   Inhalation   Inhale into the lungs every 6 (six) hours as needed for wheezing or shortness of breath.         Marland Kitchen. amoxicillin-clavulanate (AUGMENTIN) 875-125 MG per tablet   Oral   Take 1 tablet by mouth 2 (two) times daily.   20 tablet   0   . azithromycin (ZITHROMAX Z-PAK) 250 MG tablet      2 po qd for 3 days   6 each   0     No celexa for 3 days   . benzonatate (TESSALON PERLES) 100 MG capsule   Oral   Take 1 capsule (100 mg total) by mouth 3 (three) times daily as needed for cough.   30 capsule   0   . citalopram (CELEXA) 40 MG tablet   Oral   Take 40 mg by mouth daily.         . citalopram (CELEXA) 40 MG tablet      TAKE 1 TABLET BY MOUTH EVERY  DAY   90 tablet   0   . doxycycline (VIBRA-TABS) 100 MG tablet   Oral   Take 1 tablet (100 mg total) by mouth 2 (two) times daily. For 10 days   20 tablet   0   . fexofenadine (ALLEGRA) 180 MG tablet   Oral   Take 180 mg by mouth daily.         Marland Kitchen HYDROcodone-homatropine (HYCODAN) 5-1.5 MG/5ML syrup      1 tsp qhs prn cough   90 mL   0   . LORazepam (ATIVAN) 0.5 MG tablet   Oral   Take 0.5 mg by mouth every 6 (six) hours as needed for anxiety.         Marland Kitchen LORazepam (ATIVAN) 0.5 MG tablet   Oral   Take 0.5 mg by mouth every 8 (eight) hours.         . meloxicam (MOBIC) 15 MG tablet      TAKE 1 TABLET(15 MG) BY MOUTH DAILY   30 tablet   0     **Patient requests 90 days supply**   . traMADol (ULTRAM) 50 MG tablet      Take 1 tablet three times a day as needed for pain Patient not taking: Reported on 06/09/2015   21 tablet   1     Allergies Buspar; Sulfa antibiotics; Sulfa antibiotics; and Buspar  Family History  Problem Relation  Age of Onset  . Cancer Paternal Uncle   . COPD Maternal Grandfather   . Stroke Paternal Grandfather     Social History Social History  Substance Use Topics  . Smoking status: Former Smoker -- 0.50 packs/day    Types: Cigarettes    Start date: 01/28/2013  . Smokeless tobacco: Never Used  . Alcohol Use: No    Review of Systems Constitutional: Negative for fever positive for nasal congestion Cardiovascular: Negative for chest pain. Respiratory: Negative for shortness of breath. Positive cough. Positive for sputum. Gastrointestinal: Negative for abdominal pain Musculoskeletal: Negative for back pain. Neurological: Negative for headache 10-point ROS otherwise negative.  ____________________________________________   PHYSICAL EXAM:  VITAL SIGNS: ED Triage Vitals  Enc Vitals Group     BP 09/04/15 0014 109/64 mmHg     Pulse Rate 09/04/15 0014 84     Resp 09/04/15 0014 20     Temp 09/04/15 0014 98.2 F (36.8 C)     Temp Source 09/04/15 0014 Oral     SpO2 09/04/15 0014 98 %     Weight 09/04/15 0014 200 lb (90.719 kg)     Height 09/04/15 0014  (1.651 m)     Head Cir --      Peak Flow --      Pain Score 09/04/15 0016 6     Pain Loc --      Pain Edu? --      Excl. in GC? --    Constitutional: Alert and oriented. Well appearing and in no distress. Eyes: Normal exam ENT   Head: Normocephalic and atraumatic.   Mouth/Throat: Mucous membranes are moist. Moderate nasal congestion. Tympanic membranes are normal. Cardiovascular: Normal rate, regular rhythm.  Respiratory: Normal respiratory effort without tachypnea nor retractions. Breath sounds are clear and equal bilaterally. No wheezes/rales/rhonchi. Gastrointestinal: Soft and nontender. No distention Musculoskeletal: Nontender with normal range of motion in all extremities.  Neurologic:  Normal speech and language. No gross focal neurologic deficits  Psychiatric: Mood and affect are normal. Speech and behavior are  normal.  ____________________________________________     RADIOLOGY  Negative for pneumonia  INITIAL IMPRESSION / ASSESSMENT AND PLAN / ED COURSE  Pertinent labs & imaging results that were available during my care of the patient were reviewed by me and considered in my medical decision making (see chart for details).  Patient with likely upper respiratory infection. We'll prescribe aspirin, patient is to use Motrin/Tylenol for supportive care at home. Patient agreeable to plan.  ____________________________________________   FINAL CLINICAL IMPRESSION(S) / ED DIAGNOSES  Upper respiratory infection   Minna Antis, MD 09/04/15 0222

## 2015-09-08 ENCOUNTER — Encounter: Payer: Self-pay | Admitting: Emergency Medicine

## 2015-09-08 ENCOUNTER — Emergency Department
Admission: EM | Admit: 2015-09-08 | Discharge: 2015-09-08 | Disposition: A | Discharge status: Home / Self Care | Payer: 59 | Attending: Emergency Medicine | Admitting: Emergency Medicine

## 2015-09-08 ENCOUNTER — Emergency Department: Payer: 59

## 2015-09-08 DIAGNOSIS — J0100 Acute maxillary sinusitis, unspecified: Secondary | ICD-10-CM | POA: Insufficient documentation

## 2015-09-08 DIAGNOSIS — J45901 Unspecified asthma with (acute) exacerbation: Secondary | ICD-10-CM | POA: Insufficient documentation

## 2015-09-08 DIAGNOSIS — Z791 Long term (current) use of non-steroidal anti-inflammatories (NSAID): Secondary | ICD-10-CM | POA: Diagnosis not present

## 2015-09-08 DIAGNOSIS — Z79899 Other long term (current) drug therapy: Secondary | ICD-10-CM | POA: Insufficient documentation

## 2015-09-08 DIAGNOSIS — R05 Cough: Secondary | ICD-10-CM | POA: Diagnosis present

## 2015-09-08 DIAGNOSIS — Z87891 Personal history of nicotine dependence: Secondary | ICD-10-CM | POA: Diagnosis not present

## 2015-09-08 DIAGNOSIS — Z792 Long term (current) use of antibiotics: Secondary | ICD-10-CM | POA: Diagnosis not present

## 2015-09-08 DIAGNOSIS — J40 Bronchitis, not specified as acute or chronic: Secondary | ICD-10-CM

## 2015-09-08 MED ORDER — PREDNISONE 10 MG PO TABS: 50.0000 mg | ORAL_TABLET | Freq: Every day | ORAL | Status: DC

## 2015-09-08 MED ORDER — AMOXICILLIN 500 MG PO TABS: 500.0000 mg | ORAL_TABLET | Freq: Three times a day (TID) | ORAL | Status: DC

## 2015-09-08 MED ORDER — ALBUTEROL SULFATE (2.5 MG/3ML) 0.083% IN NEBU
2.5000 mg | INHALATION_SOLUTION | Freq: Once | RESPIRATORY_TRACT | Status: AC
  Administered 2015-09-08: 2.5 mg via RESPIRATORY_TRACT
  Filled 2015-09-08: qty 3

## 2015-09-08 NOTE — ED Provider Notes (Signed)
[facility] Emergency Department Provider Note ____________________________________________  Time seen: Approximately 5:21 PM  I have reviewed the triage vital signs and the nursing notes.   HISTORY  Chief Complaint Cough   HPI Linda [name] is a 24 y.o. female who presents to the emergency department for evaluation of a upper respiratory tract infection that has not improved since her visit here 2 days ago. She states that the cough has gotten worse and she now has pain in her chest with the cough. She reports the cough is productive of dark green sputum. She is using nasal spray as prescribed at her last visit without relief. She denies fever.   Past Medical History  Diagnosis Date  . Asthma   . IBS (irritable bowel syndrome)   . Heart murmur   . Interstitial cystitis   . Anxiety     Patient Active Problem List   Diagnosis Date Noted  . Hypothyroidism 06/21/2014  . Interstitial cystitis 06/14/2014  . 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 Morbid obesity (HCC) 02/07/2014  . 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 Asthma, chronic 04/15/2013  . Chronic anxiety 04/15/2013  . Cervical strain 03/07/2012  . Sprain and strain of unspecified site of shoulder and upper arm 03/07/2012  . Pain in joint, shoulder region 03/07/2012    Past Surgical History  Procedure Laterality Date  . Myringotomy with tube placement    . Upper gi endoscopy    . Tympanostomy tube placement    . No past surgeries    . Cesarean section N/A 03/20/2013    Procedure: CESAREAN SECTION;  Surgeon: [name], MD;  Location: [facility];  Service: Obstetrics;  Laterality: N/A;  . Cesarean section      Current Outpatient Rx  Name  Route  Sig  Dispense  Refill  . ADDERALL XR 30 MG 24 hr capsule   Oral   Take 1 capsule (30 mg total) by mouth daily.   30 capsule   0     Dispense as written.    BRAND NAME ONLY   . albuterol (PROVENTIL  HFA;VENTOLIN HFA) 108 (90 BASE) MCG/ACT inhaler   Inhalation   Inhale 2 puffs into the lungs every 6 (six) hours as needed for wheezing or shortness of breath.   1 Inhaler   2   . albuterol (PROVENTIL HFA;VENTOLIN HFA) 108 (90 BASE) MCG/ACT inhaler   Inhalation   Inhale into the lungs every 6 (six) hours as needed for wheezing or shortness of breath.         Marland Kitchen amoxicillin (AMOXIL) 500 MG tablet   Oral   Take 1 tablet (500 mg total) by mouth 3 (three) times daily.   30 tablet   0   . amoxicillin-clavulanate (AUGMENTIN) 875-125 MG per tablet   Oral   Take 1 tablet by mouth 2 (two) times daily.   20 tablet   0   . azithromycin (ZITHROMAX Z-PAK) 250 MG tablet      2 po qd for 3 days   6 each   0     No celexa for 3 days   . benzonatate (TESSALON PERLES) 100 MG capsule   Oral   Take 1 capsule (100 mg total) by mouth 3 (three) times daily as needed for cough.   30 capsule   0   . citalopram (CELEXA) 40 MG tablet   Oral   Take 40 mg by mouth daily.         Marland Kitchen  citalopram (CELEXA) 40 MG tablet      TAKE 1 TABLET BY MOUTH EVERY DAY   90 tablet   0   . doxycycline (VIBRA-TABS) 100 MG tablet   Oral   Take 1 tablet (100 mg total) by mouth 2 (two) times daily. For 10 days   20 tablet   0   . fexofenadine (ALLEGRA) 180 MG tablet   Oral   Take 180 mg by mouth daily.         Marland Kitchen HYDROcodone-homatropine (HYCODAN) 5-1.5 MG/5ML syrup      1 tsp qhs prn cough   90 mL   0   . LORazepam (ATIVAN) 0.5 MG tablet   Oral   Take 0.5 mg by mouth every 6 (six) hours as needed for anxiety.         Marland Kitchen LORazepam (ATIVAN) 0.5 MG tablet   Oral   Take 0.5 mg by mouth every 8 (eight) hours.         . meloxicam (MOBIC) 15 MG tablet      TAKE 1 TABLET(15 MG) BY MOUTH DAILY   30 tablet   0     **Patient requests 90 days supply**   . predniSONE (DELTASONE) 10 MG tablet   Oral   Take 5 tablets (50 mg total) by mouth daily.   25 tablet   0   . traMADol (ULTRAM) 50 MG  tablet      Take 1 tablet three times a day as needed for pain Patient not taking: Reported on 06/09/2015   21 tablet   1     Allergies Buspar; Sulfa antibiotics; Sulfa antibiotics; and Buspar  Family History  Problem Relation Age of Onset  . Cancer Paternal Uncle   . COPD Maternal Grandfather   . Stroke Paternal Grandfather     Social History Social History  Substance Use Topics  . Smoking status: Former Smoker -- 0.50 packs/day    Types: Cigarettes    Start date: 01/28/2013  . Smokeless tobacco: Never Used  . Alcohol Use: No    Review of Systems Constitutional: No fever/chills Eyes: No visual changes. ENT: No sore throat. Cardiovascular: Denies chest pain. Respiratory: Positive for shortness of breath. Positive for cough. Gastrointestinal: Negative for abdominal pain. Negative for nausea,  negative for vomiting.  Negative for diarrhea.  Genitourinary: Negative for dysuria. Musculoskeletal: Positive for body aches Skin: Negative for rash. Neurological: Positive for headaches, Negative for focal weakness or numbness.  10-point ROS otherwise negative.  ____________________________________________   PHYSICAL EXAM:   VITAL SIGNS: ED Triage Vitals  Enc Vitals Group     BP --      Pulse --      Resp --      Temp --      Temp src --      SpO2 --      Weight --      Height --      Head Cir --      Peak Flow --      Pain Score --      Pain Loc --      Pain Edu? --      Excl. in GC? --     Constitutional: Alert and oriented. Well appearing and in no acute distress. Eyes: Conjunctivae are normal. PERRL. EOMI. Head: Atraumatic. Nose: No congestion/rhinnorhea. Mouth/Throat: Mucous membranes are moist.  Oropharynx non-erythematous. Neck: No stridor.  Lymphatic: No cervical lymphadenopathy. Cardiovascular: Normal rate, regular rhythm. Grossly normal heart  sounds.  Good peripheral circulation. Respiratory: Normal respiratory effort.  No retractions. Lungs  wheezing and rhonchi throughout. Gastrointestinal: Soft and nontender. No distention. No abdominal bruits. No CVA tenderness. Musculoskeletal: No joint pain reported. Neurologic:  Normal speech and language. No gross focal neurologic deficits are appreciated. Speech is normal. No gait instability. Skin:  Skin is warm, dry and intact. No rash noted. Psychiatric: Mood and affect are normal. Speech and behavior are normal.  ____________________________________________   LABS (all labs ordered are listed, but only abnormal results are displayed)  Labs Reviewed - No data to display ____________________________________________  EKG   ____________________________________________  RADIOLOGY  Chest x-ray negative for acute cardiopulmonary abnormality ____________________________________________   PROCEDURES  Procedure(s) performed: None  Critical Care performed: No  ____________________________________________   INITIAL IMPRESSION / ASSESSMENT AND PLAN / ED COURSE  Pertinent labs & imaging results that were available during my care of the patient were reviewed by me and considered in my medical decision making (see chart for details).   Patient was advised to up with her primary care provider or return to the emergency department for symptoms change or worsen.  She'll be given a prescription for amoxicillin for sinusitis and prednisone to help with bronchitis. She was advised to continue using the inhaler as well as the nasal spray. ____________________________________________   FINAL CLINICAL IMPRESSION(S) / ED DIAGNOSES  Final diagnoses:  Bronchitis  Acute maxillary sinusitis, recurrence not specified       Chinita PesterCari B Shaman Muscarella, FNP 09/08/15 1959  Minna AntisKevin Paduchowski, MD 09/08/15 2245

## 2015-09-08 NOTE — ED Notes (Signed)
nonproductive

## 2015-10-18 ENCOUNTER — Other Ambulatory Visit: Payer: Self-pay | Admitting: Family Medicine

## 2015-10-20 NOTE — Telephone Encounter (Signed)
Ok for three mo total, rec o v towards end of that

## 2015-10-27 ENCOUNTER — Encounter: Payer: Self-pay | Admitting: Emergency Medicine

## 2015-10-27 ENCOUNTER — Ambulatory Visit
Admission: EM | Admit: 2015-10-27 | Discharge: 2015-10-27 | Disposition: A | Discharge status: Home / Self Care | Payer: 59 | Attending: Family Medicine | Admitting: Family Medicine

## 2015-10-27 DIAGNOSIS — J069 Acute upper respiratory infection, unspecified: Secondary | ICD-10-CM | POA: Diagnosis not present

## 2015-10-27 MED ORDER — LORATADINE-PSEUDOEPHEDRINE ER 5-120 MG PO TB12: 1.0000 | ORAL_TABLET | Freq: Two times a day (BID) | ORAL | Status: AC

## 2015-10-27 MED ORDER — BENZONATATE 100 MG PO CAPS: 100.0000 mg | ORAL_CAPSULE | Freq: Three times a day (TID) | ORAL | Status: DC | PRN

## 2015-10-27 MED ORDER — GUAIFENESIN-CODEINE 100-10 MG/5ML PO SOLN: 10.0000 mL | Freq: Every evening | ORAL | Status: DC | PRN

## 2015-10-27 NOTE — ED Provider Notes (Signed)
[facility]  ____________________________________________  Time seen: Approximately 2:40 PM  I have reviewed the triage vital signs and the nursing notes.   HISTORY  Chief Complaint Nasal Congestion; Facial Pain; and Headache   HPI Linda [name] is a 24 y.o. female presents for complaints of 3 days of runny nose, nasal congestion and intermittent cough. States cough more at night and in morning with drainage in back of throat. Reports multiple family members recently with similar. Reports continues to eat and drink well. States felt warm yesterday, but no known fever.   States current sinus drainage and sinus discomfort is 4/10, denies other pain or discomfort. States unrelieved with over the counter mucinex.   Denies chest pain, shortness of breath, abdominal pain, dysuria, dizziness or rash.   PCP: Gerda DissLuking  LMP: 3 weeks ago, denies chance of pregnancy.    Past Medical History  Diagnosis Date  . Asthma   . IBS (irritable bowel syndrome)   . Heart murmur   . Interstitial cystitis   . Anxiety     Patient Active Problem List   Diagnosis Date Noted  . Hypothyroidism 06/21/2014  . Interstitial cystitis 06/14/2014  . 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 Morbid obesity (HCC) 02/07/2014  . 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 Asthma, chronic 04/15/2013  . Chronic anxiety 04/15/2013  . Cervical strain 03/07/2012  . Sprain and strain of unspecified site of shoulder and upper arm 03/07/2012  . Pain in joint, shoulder region 03/07/2012    Past Surgical History  Procedure Laterality Date  . Myringotomy with tube placement    . Upper gi endoscopy    . Tympanostomy tube placement    . No past surgeries    . Cesarean section N/A 03/20/2013    Procedure: CESAREAN SECTION;  Surgeon: Oliver PilaKathy W Richardson, MD;  Location: [facility];  Service: Obstetrics;  Laterality: N/A;  . Cesarean section      Current Outpatient Rx  Name  Route  Sig   Dispense  Refill  . ADDERALL XR 30 MG 24 hr capsule   Oral   Take 1 capsule (30 mg total) by mouth daily.   30 capsule   0     Dispense as written.    BRAND NAME ONLY   .           Marland Kitchen.           . citalopram (CELEXA) 40 MG tablet   Oral   Take 40 mg by mouth daily.         . fexofenadine (ALLEGRA) 180 MG tablet   Oral   Take 180 mg by mouth daily.         .           .             Allergies Buspar; Sulfa antibiotics; Sulfa antibiotics; and Buspar  Family History  Problem Relation Age of Onset  . Cancer Paternal Uncle   . COPD Maternal Grandfather   . Stroke Paternal Grandfather     Social History Social History  Substance Use Topics  . Smoking status: Former Smoker -- 0.50 packs/day    Types: Cigarettes    Start date: 01/28/2013  . Smokeless tobacco: Never Used  . Alcohol Use: No    Review of Systems Constitutional: No fever/chills. Felt warm.  Eyes: No visual changes. ENT: No sore throat. Positive runny nose, nasal congestion and intermittent cough.  Cardiovascular: Denies chest pain.  Respiratory: Denies shortness of breath. Gastrointestinal: No abdominal pain.  No nausea, no vomiting.  No diarrhea.  No constipation. Genitourinary: Negative for dysuria. Musculoskeletal: Negative for back pain. Skin: Negative for rash. Neurological: Negative for headaches, focal weakness or numbness.  10-point ROS otherwise negative.  ____________________________________________   PHYSICAL EXAM:  VITAL SIGNS: ED Triage Vitals  Enc Vitals Group     BP 10/27/15 1356 123/63 mmHg     Pulse Rate 10/27/15 1356 82     Resp 10/27/15 1356 16     Temp 10/27/15 1356 98.5 F (36.9 C)     Temp Source 10/27/15 1356 Tympanic     SpO2 10/27/15 1356 99 %     Weight 10/27/15 1356 200 lb (90.719 kg)     Height 10/27/15 1356  (1.676 m)     Head Cir --      Peak Flow --      Pain Score --      Pain Loc --      Pain Edu? --      Excl. in GC? --     Constitutional:  Alert and oriented. Well appearing and in no acute distress. Eyes: Conjunctivae are normal. PERRL. EOMI. Head: Atraumatic.minimal maxillary sinus TTP, no frontal sinus TTP. No swelling. No erythema.   Ears: no erythema, normal TMs bilaterally.   Nose: nasal congestion, and clear rhinorrhea.   Mouth/Throat: Mucous membranes are moist.  Oropharynx non-erythematous. No tonsillar swelling or exudate.  Neck: No stridor.  No cervical spine tenderness to palpation. Hematological/Lymphatic/Immunilogical: No cervical lymphadenopathy. Cardiovascular: Normal rate, regular rhythm. Grossly normal heart sounds.  Good peripheral circulation. Respiratory: Normal respiratory effort.  No retractions. Lungs CTAB. No wheezes, rales or rhonchi. Good air movement.  Gastrointestinal: Soft and nontender. Obese abdomen. Normal Bowel sounds. Musculoskeletal: No lower or upper extremity tenderness nor edema.  No joint effusions. Bilateral pedal pulses equal and easily palpated.  Neurologic:  Normal speech and language. No gross focal neurologic deficits are appreciated. No gait instability. Skin:  Skin is warm, dry and intact. No rash noted. Psychiatric: Mood and affect are normal. Speech and behavior are normal.  ____________________________________________   LABS (all labs ordered are listed, but only abnormal results are displayed)  Labs Reviewed - No data to display  INITIAL IMPRESSION / ASSESSMENT AND PLAN / ED COURSE  Pertinent labs & imaging results that were available during my care of the patient were reviewed by me and considered in my medical decision making (see chart for details).  Very well appearing. No acute distress. Presents for runny nose , nasal congestion and intermittent cough x 3 days. Lungs clear throughout. Suspect viral upper respiratory infection and will treat supportively with claritin-d, tessalon perles and prn qhs guaifenesin with codeine, fluids, and rest. Follow up with PCP. Work  note for today and tomorrow given.   Discussed follow up with Primary care physician this week. Discussed follow up and return parameters including no resolution or any worsening concerns. Patient verbalized understanding and agreed to plan.   ____________________________________________   FINAL CLINICAL IMPRESSION(S) / ED DIAGNOSES  Final diagnoses:  Viral upper respiratory illness       Renford Dills, NP 10/27/15 1515

## 2015-10-27 NOTE — ED Notes (Signed)
Patient c/o runny nose, cough, sinus congestion and pressure, and HAs for the past 2 days.

## 2015-10-27 NOTE — Discharge Instructions (Signed)
Take medication as prescribed. Rest. Drink plenty of fluids.  ° °Follow up with your primary care physician this week as needed. Return to Urgent care for new or worsening concerns.  ° ° °Upper Respiratory Infection, Adult °Most upper respiratory infections (URIs) are a viral infection of the air passages leading to the lungs. A URI affects the nose, throat, and upper air passages. The most common type of URI is nasopharyngitis and is typically referred to as "the common cold." °URIs run their course and usually go away on their own. Most of the time, a URI does not require medical attention, but sometimes a bacterial infection in the upper airways can follow a viral infection. This is called a secondary infection. Sinus and middle ear infections are common types of secondary upper respiratory infections. °Bacterial pneumonia can also complicate a URI. A URI can worsen asthma and chronic obstructive pulmonary disease (COPD). Sometimes, these complications can require emergency medical care and may be life threatening.  °CAUSES °Almost all URIs are caused by viruses. A virus is a type of germ and can spread from one person to another.  °RISKS FACTORS °You may be at risk for a URI if:  °· You smoke.   °· You have chronic heart or lung disease. °· You have a weakened defense (immune) system.   °· You are very young or very old.   °· You have nasal allergies or asthma. °· You work in crowded or poorly ventilated areas. °· You work in health care facilities or schools. °SIGNS AND SYMPTOMS  °Symptoms typically develop 2-3 days after you come in contact with a cold virus. Most viral URIs last 7-10 days. However, viral URIs from the influenza virus (flu virus) can last 14-18 days and are typically more severe. Symptoms may include:  °· Runny or stuffy (congested) nose.   °· Sneezing.   °· Cough.   °· Sore throat.   °· Headache.   °· Fatigue.   °· Fever.   °· Loss of appetite.   °· Pain in your forehead, behind your eyes,  and over your cheekbones (sinus pain). °· Muscle aches.   °DIAGNOSIS  °Your health care provider may diagnose a URI by: °· Physical exam. °· Tests to check that your symptoms are not due to another condition such as: °¨ Strep throat. °¨ Sinusitis. °¨ Pneumonia. °¨ Asthma. °TREATMENT  °A URI goes away on its own with time. It cannot be cured with medicines, but medicines may be prescribed or recommended to relieve symptoms. Medicines may help: °· Reduce your fever. °· Reduce your cough. °· Relieve nasal congestion. °HOME CARE INSTRUCTIONS  °· Take medicines only as directed by your health care provider.   °· Gargle warm saltwater or take cough drops to comfort your throat as directed by your health care provider. °· Use a warm mist humidifier or inhale steam from a shower to increase air moisture. This may make it easier to breathe. °· Drink enough fluid to keep your urine clear or pale yellow.   °· Eat soups and other clear broths and maintain good nutrition.   °· Rest as needed.   °· Return to work when your temperature has returned to normal or as your health care provider advises. You may need to stay home longer to avoid infecting others. You can also use a face mask and careful hand washing to prevent spread of the virus. °· Increase the usage of your inhaler if you have asthma.   °· Do not use any tobacco products, including cigarettes, chewing tobacco, or electronic cigarettes. If you need help   quitting, ask your health care provider. °PREVENTION  °The best way to protect yourself from getting a cold is to practice good hygiene.  °· Avoid oral or hand contact with people with cold symptoms.   °· Wash your hands often if contact occurs.   °There is no clear evidence that vitamin C, vitamin E, echinacea, or exercise reduces the chance of developing a cold. However, it is always recommended to get plenty of rest, exercise, and practice good nutrition.  °SEEK MEDICAL CARE IF:  °· You are getting worse rather than  better.   °· Your symptoms are not controlled by medicine.   °· You have chills. °· You have worsening shortness of breath. °· You have brown or red mucus. °· You have yellow or brown nasal discharge. °· You have pain in your face, especially when you bend forward. °· You have a fever. °· You have swollen neck glands. °· You have pain while swallowing. °· You have white areas in the back of your throat. °SEEK IMMEDIATE MEDICAL CARE IF:  °· You have severe or persistent: °¨ Headache. °¨ Ear pain. °¨ Sinus pain. °¨ Chest pain. °· You have chronic lung disease and any of the following: °¨ Wheezing. °¨ Prolonged cough. °¨ Coughing up blood. °¨ A change in your usual mucus. °· You have a stiff neck. °· You have changes in your: °¨ Vision. °¨ Hearing. °¨ Thinking. °¨ Mood. °MAKE SURE YOU:  °· Understand these instructions. °· Will watch your condition. °· Will get help right away if you are not doing well or get worse. °  °This information is not intended to replace advice given to you by your health care provider. Make sure you discuss any questions you have with your health care provider. °  °Document Released: 04/27/2001 Document Revised: 03/18/2015 Document Reviewed: 02/06/2014 °[employer] Interactive Patient Education ©2016 [employer] ° °

## 2015-11-10 ENCOUNTER — Encounter: Payer: Self-pay | Admitting: Emergency Medicine

## 2015-11-10 ENCOUNTER — Ambulatory Visit
Admission: EM | Admit: 2015-11-10 | Discharge: 2015-11-10 | Disposition: A | Discharge status: Home / Self Care | Payer: 59 | Attending: Family Medicine | Admitting: Family Medicine

## 2015-11-10 DIAGNOSIS — N390 Urinary tract infection, site not specified: Secondary | ICD-10-CM | POA: Diagnosis not present

## 2015-11-10 LAB — URINALYSIS COMPLETE WITH MICROSCOPIC (ARMC ONLY)
BILIRUBIN URINE: NEGATIVE
GLUCOSE, UA: NEGATIVE mg/dL
HGB URINE DIPSTICK: NEGATIVE
NITRITE: NEGATIVE
PH: 7.5 (ref 5.0–8.0)
Protein, ur: NEGATIVE mg/dL
RBC / HPF: NONE SEEN RBC/hpf (ref 0–5)
SPECIFIC GRAVITY, URINE: 1.02 (ref 1.005–1.030)

## 2015-11-10 LAB — PREGNANCY, URINE: Preg Test, Ur: NEGATIVE

## 2015-11-10 MED ORDER — CEPHALEXIN 500 MG PO CAPS: 500.0000 mg | ORAL_CAPSULE | Freq: Two times a day (BID) | ORAL | Status: AC

## 2015-11-10 NOTE — ED Provider Notes (Signed)
[facility]  ____________________________________________  Time seen: Approximately 6:25 PM  I have reviewed the triage vital signs and the nursing notes.   HISTORY  Chief Complaint Dysuria   HPI Linda [name] is a 24 y.o. female presents for the complaints of to 3 days of urinary frequency as well as urinary burning. Patient states that she has had a urinary tract infection in the past which felt similar. States no recent urinary tract infections at least none in the last 2 years. Patient denies pain or discomfort in absence of urinating.  Patient denies current pain. Denies known fevers but reports intermittent cold chills. Denies back pain, abdominal pain, side pain, vomiting or nausea. Patient does report occasional intermittent vaginal discharge, but reports this is not constant and only occasional. Denies vaginal pain, vaginal itching or vaginal odor. Reports that she is sexually active with 1 partner. Denies concern for STDs. Patient reports that she is actively trying to conceive. Reports last menstrual 3-4 weeks ago.  Denies recent antibiotic use. Reports continues to eat and drink well.  Linda [name]  Past Medical History  Diagnosis Date  . Asthma   . IBS (irritable bowel syndrome)   . Heart murmur   . Interstitial cystitis   . Anxiety     Patient Active Problem List   Diagnosis Date Noted  . Hypothyroidism 06/21/2014  . Interstitial cystitis 06/14/2014  . 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 Morbid obesity (HCC) 02/07/2014  . 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 Asthma, chronic 04/15/2013  . Chronic anxiety 04/15/2013  . Cervical strain 03/07/2012  . Sprain and strain of unspecified site of shoulder and upper arm 03/07/2012  . Pain in joint, shoulder region 03/07/2012    Past Surgical History  Procedure Laterality Date  . Myringotomy with tube placement    . Upper gi endoscopy    . Tympanostomy tube  placement    . No past surgeries    . Cesarean section N/A 03/20/2013    Procedure: CESAREAN SECTION;  Surgeon: [name], MD;  Location: [facility];  Service: Obstetrics;  Laterality: N/A;  . Cesarean section      LMP: 4 weeks.   Current Outpatient Rx  Name  Route  Sig  Dispense  Refill  . ADDERALL XR 30 MG 24 hr capsule   Oral   Take 1 capsule (30 mg total) by mouth daily.   30 capsule   0     Dispense as written.    BRAND NAME ONLY   .           Marland Kitchen           . citalopram (CELEXA) 40 MG tablet   Oral   Take 40 mg by mouth daily.         . fexofenadine (ALLEGRA) 180 MG tablet   Oral   Take 180 mg by mouth daily.         .             Allergies Buspar; Sulfa antibiotics; Sulfa antibiotics; and Buspar  Family History  Problem Relation Age of Onset  . Cancer Paternal Uncle   . COPD Maternal Grandfather   . Stroke Paternal Grandfather     Social History Social History  Substance Use Topics  . Smoking status: Former Smoker -- 0.50 packs/day    Types: Cigarettes    Start date: 01/28/2013  . Smokeless tobacco: Never Used  . Alcohol Use: No  Review of Systems Constitutional: No fever/chills Eyes: No visual changes. ENT: No sore throat. Cardiovascular: Denies chest pain. Respiratory: Denies shortness of breath. Gastrointestinal: No abdominal pain.  No nausea, no vomiting.  No diarrhea.  No constipation. Genitourinary: Positive for dysuria. Musculoskeletal: Negative for back pain. Skin: Negative for rash. Neurological: Negative for headaches, focal weakness or numbness.  10-point ROS otherwise negative.  ____________________________________________   PHYSICAL EXAM:  VITAL SIGNS: ED Triage Vitals  Enc Vitals Group     BP 11/10/15 1724 145/60 mmHg     Pulse Rate 11/10/15 1724 74     Resp 11/10/15 1724 16     Temp 11/10/15 1724 98.4 F (36.9 C)     Temp Source 11/10/15 1724 Tympanic     SpO2 11/10/15 1724 97 %     Weight 11/10/15 1724 200  lb (90.719 kg)     Height 11/10/15 1724  (1.651 m)     Head Cir --      Peak Flow --      Pain Score 11/10/15 1727 5     Pain Loc --      Pain Edu? --      Excl. in GC? --     Constitutional: Alert and oriented. Well appearing and in no acute distress. Eyes: Conjunctivae are normal. PERRL. EOMI. Head: Atraumatic.  Nose: No congestion/rhinnorhea.  Mouth/Throat: Mucous membranes are moist.  Oropharynx non-erythematous. Neck: No stridor.  No cervical spine tenderness to palpation. Hematological/Lymphatic/Immunilogical: No cervical lymphadenopathy. Cardiovascular: Normal rate, regular rhythm. Grossly normal heart sounds.  Good peripheral circulation. Respiratory: Normal respiratory effort.  No retractions. Lungs CTAB. No wheezes, rales or rhonchi. Good air movement. Gastrointestinal: Soft and nontender. Obese abdomen. Normal Bowel sounds. No CVA tenderness. Musculoskeletal: No lower or upper extremity tenderness nor edema.Bilateral pedal pulses equal and easily palpated.  Neurologic:  Normal speech and language. No gross focal neurologic deficits are appreciated. No gait instability. Skin:  Skin is warm, dry and intact. No rash noted. Psychiatric: Mood and affect are normal. Speech and behavior are normal.  ____________________________________________   LABS (all labs ordered are listed, but only abnormal results are displayed)  Labs Reviewed  URINALYSIS COMPLETEWITH MICROSCOPIC ([facility] ONLY) - Abnormal; Notable for the following:    APPearance HAZY (*)    Ketones, ur TRACE (*)    Leukocytes, UA TRACE (*)    Bacteria, UA RARE (*)    Squamous Epithelial / LPF 6-30 (*)    All other components within normal limits  URINE CULTURE  PREGNANCY, URINE     INITIAL IMPRESSION / ASSESSMENT AND PLAN / ED COURSE  Pertinent labs & imaging results that were available during my care of the patient were reviewed by me and considered in my medical decision making (see chart for  details).  Very well-appearing patient. No acute distress. Presents for complaints of dysuria and burning with urination 2-3 days. Denies vaginal pain, vaginal odor or concerns for STDs. Patient does report intermittent vaginal discharge. Urinalysis positive for bacteria, trace leukocytes as well as trace ketones, will culture urine. Suspect urinary tract infection. Discussed patient can further evaluate vaginal discharge by pelvic exam, patient states she does not want a pelvic exam completed at this time and reports that she will follow up with her OB/GYN as needed for continued vaginal discharge. As patient actively trying to conceive will treat urinary tract infection with oral cephalexin, and culture urine. Counseled to follow up with primary care physician as well as OB/GYN in 1  week.   Discussed follow up with Primary care physician this week. Discussed follow up and return parameters including no resolution or any worsening concerns. Patient verbalized understanding and agreed to plan.   ____________________________________________   FINAL CLINICAL IMPRESSION(S) / ED DIAGNOSES  Final diagnoses:  UTI (lower urinary tract infection)       Renford DillsLindsey Anderia Lorenzo, NP 11/10/15 1945

## 2015-11-10 NOTE — ED Notes (Signed)
Patient c/o burning when urinating and frequent urination for 2-3 days.  Patient denies fevers.

## 2015-11-10 NOTE — Discharge Instructions (Signed)
Take medication as prescribed. Rest. Drink plenty of fluids.   Follow up with your primary care physician this week. Return to Urgent care or proceed to ER for fever, abdominal pain, inability to eat or drink, new or worsening concerns.   Urinary Tract Infection Urinary tract infections (UTIs) can develop anywhere along your urinary tract. Your urinary tract is your body's drainage system for removing wastes and extra water. Your urinary tract includes two kidneys, two ureters, a bladder, and a urethra. Your kidneys are a pair of bean-shaped organs. Each kidney is about the size of your fist. They are located below your ribs, one on each side of your spine. CAUSES Infections are caused by microbes, which are microscopic organisms, including fungi, viruses, and bacteria. These organisms are so small that they can only be seen through a microscope. Bacteria are the microbes that most commonly cause UTIs. SYMPTOMS  Symptoms of UTIs may vary by age and gender of the patient and by the location of the infection. Symptoms in young women typically include a frequent and intense urge to urinate and a painful, burning feeling in the bladder or urethra during urination. Older women and men are more likely to be tired, shaky, and weak and have muscle aches and abdominal pain. A fever may mean the infection is in your kidneys. Other symptoms of a kidney infection include pain in your back or sides below the ribs, nausea, and vomiting. DIAGNOSIS To diagnose a UTI, your caregiver will ask you about your symptoms. Your caregiver will also ask you to provide a urine sample. The urine sample will be tested for bacteria and white blood cells. White blood cells are made by your body to help fight infection. TREATMENT  Typically, UTIs can be treated with medication. Because most UTIs are caused by a bacterial infection, they usually can be treated with the use of antibiotics. The choice of antibiotic and length of  treatment depend on your symptoms and the type of bacteria causing your infection. HOME CARE INSTRUCTIONS  If you were prescribed antibiotics, take them exactly as your caregiver instructs you. Finish the medication even if you feel better after you have only taken some of the medication.  Drink enough water and fluids to keep your urine clear or pale yellow.  Avoid caffeine, tea, and carbonated beverages. They tend to irritate your bladder.  Empty your bladder often. Avoid holding urine for long periods of time.  Empty your bladder before and after sexual intercourse.  After a bowel movement, women should cleanse from front to back. Use each tissue only once. SEEK MEDICAL CARE IF:   You have back pain.  You develop a fever.  Your symptoms do not begin to resolve within 3 days. SEEK IMMEDIATE MEDICAL CARE IF:   You have severe back pain or lower abdominal pain.  You develop chills.  You have nausea or vomiting.  You have continued burning or discomfort with urination. MAKE SURE YOU:   Understand these instructions.  Will watch your condition.  Will get help right away if you are not doing well or get worse.   This information is not intended to replace advice given to you by your health care provider. Make sure you discuss any questions you have with your health care provider.   Document Released: 08/11/2005 Document Revised: 07/23/2015 Document Reviewed: 12/10/2011 Elsevier Interactive Patient Education [employer]2016 Elsevier Inc.

## 2015-11-13 LAB — URINE CULTURE: Special Requests: NORMAL

## 2015-11-20 ENCOUNTER — Emergency Department
Admission: EM | Admit: 2015-11-20 | Discharge: 2015-11-20 | Disposition: A | Discharge status: Home / Self Care | Payer: 59 | Attending: Emergency Medicine | Admitting: Emergency Medicine

## 2015-11-20 ENCOUNTER — Emergency Department: Payer: 59

## 2015-11-20 ENCOUNTER — Encounter: Payer: Self-pay | Admitting: Medical Oncology

## 2015-11-20 DIAGNOSIS — R05 Cough: Secondary | ICD-10-CM

## 2015-11-20 DIAGNOSIS — Z79899 Other long term (current) drug therapy: Secondary | ICD-10-CM | POA: Insufficient documentation

## 2015-11-20 DIAGNOSIS — R059 Cough, unspecified: Secondary | ICD-10-CM

## 2015-11-20 DIAGNOSIS — J45909 Unspecified asthma, uncomplicated: Secondary | ICD-10-CM | POA: Insufficient documentation

## 2015-11-20 DIAGNOSIS — F17210 Nicotine dependence, cigarettes, uncomplicated: Secondary | ICD-10-CM | POA: Diagnosis not present

## 2015-11-20 DIAGNOSIS — Z7951 Long term (current) use of inhaled steroids: Secondary | ICD-10-CM | POA: Diagnosis not present

## 2015-11-20 MED ORDER — PSEUDOEPH-BROMPHEN-DM 30-2-10 MG/5ML PO SYRP: 5.0000 mL | ORAL_SOLUTION | Freq: Four times a day (QID) | ORAL | Status: DC | PRN

## 2015-11-20 MED ORDER — CETIRIZINE HCL 5 MG PO TABS: 5.0000 mg | ORAL_TABLET | Freq: Every day | ORAL | Status: DC

## 2015-11-20 MED ORDER — FLUTICASONE PROPIONATE 50 MCG/ACT NA SUSP: 1.0000 | Freq: Every day | NASAL | Status: DC

## 2015-11-20 NOTE — ED Notes (Signed)
Cough x 1 week

## 2015-11-20 NOTE — Discharge Instructions (Signed)
Cough, Adult A cough helps to clear your throat and lungs. A cough may last only 2-3 weeks (acute), or it may last longer than 8 weeks (chronic). Many different things can cause a cough. A cough may be a sign of an illness or another medical condition. HOME CARE  Pay attention to any changes in your cough.  Take medicines only as told by your doctor.  If you were prescribed an antibiotic medicine, take it as told by your doctor. Do not stop taking it even if you start to feel better.  Talk with your doctor before you try using a cough medicine.  Drink enough fluid to keep your pee (urine) clear or pale yellow.  If the air is dry, use a cold steam vaporizer or humidifier in your home.  Stay away from things that make you cough at work or at home.  If your cough is worse at night, try using extra pillows to raise your head up higher while you sleep.  Do not smoke, and try not to be around smoke. If you need help quitting, ask your doctor.  Do not have caffeine.  Do not drink alcohol.  Rest as needed. GET HELP IF:  You have new problems (symptoms).  You cough up yellow fluid (pus).  Your cough does not get better after 2-3 weeks, or your cough gets worse.  Medicine does not help your cough and you are not sleeping well.  You have pain that gets worse or pain that is not helped with medicine.  You have a fever.  You are losing weight and you do not know why.  You have night sweats. GET HELP RIGHT AWAY IF:  You cough up blood.  You have trouble breathing.  Your heartbeat is very fast.   This information is not intended to replace advice given to you by your health care provider. Make sure you discuss any questions you have with your health care provider.   Document Released: 07/15/2011 Document Revised: 07/23/2015 Document Reviewed: 01/08/2015 Elsevier Interactive Patient Education 2016 [employer]   Your exam and chest x-ray are normal. Continue to use your  albuterol inhaler regularly for symptoms. Take the prescription cough medicine or consider OTC Delsym or Robitussin for cough relief. Follow-up with your provider or [facility] as needed.

## 2015-11-20 NOTE — ED Provider Notes (Signed)
[facility] Emergency Department Provider Note ____________________________________________  Time seen: 1629  I have reviewed the triage vital signs and the nursing notes.  HISTORY  Chief Complaint  Cough  HPI Linda [name] is a 25 y.o. female presents to the ED for evaluation of intermittent cough for the last week. She denies any fevers, chills, sweats. She also denies any significant chest pain, shortness of breath, or wheezing.  Past Medical History  Diagnosis Date  . Asthma   . IBS (irritable bowel syndrome)   . Heart murmur   . Interstitial cystitis   . Anxiety     Patient Active Problem List   Diagnosis Date Noted  . Hypothyroidism 06/21/2014  . Interstitial cystitis 06/14/2014  . 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 Morbid obesity (HCC) 02/07/2014  . 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 Asthma, chronic 04/15/2013  . Chronic anxiety 04/15/2013  . Cervical strain 03/07/2012  . Sprain and strain of unspecified site of shoulder and upper arm 03/07/2012  . Pain in joint, shoulder region 03/07/2012    Past Surgical History  Procedure Laterality Date  . Myringotomy with tube placement    . Upper gi endoscopy    . Tympanostomy tube placement    . No past surgeries    . Cesarean section N/A 03/20/2013    Procedure: CESAREAN SECTION;  Surgeon: Oliver PilaKathy W Richardson, MD;  Location: [facility];  Service: Obstetrics;  Laterality: N/A;  . Cesarean section      Current Outpatient Rx  Name  Route  Sig  Dispense  Refill  . ADDERALL XR 30 MG 24 hr capsule   Oral   Take 1 capsule (30 mg total) by mouth daily.   30 capsule   0     Dispense as written.    BRAND NAME ONLY   . benzonatate (TESSALON PERLES) 100 MG capsule   Oral   Take 1 capsule (100 mg total) by mouth 3 (three) times daily as needed for cough.   30 capsule   0   . benzonatate (TESSALON PERLES) 100 MG capsule   Oral   Take 1 capsule (100 mg total)  by mouth 3 (three) times daily as needed for cough.   15 capsule   0   . brompheniramine-pseudoephedrine-DM 30-2-10 MG/5ML syrup   Oral   Take 5 mLs by mouth 4 (four) times daily as needed.   120 mL   0   . cetirizine (ZYRTEC) 5 MG tablet   Oral   Take 1 tablet (5 mg total) by mouth daily.   30 tablet   0   . citalopram (CELEXA) 40 MG tablet   Oral   Take 40 mg by mouth daily.         . fexofenadine (ALLEGRA) 180 MG tablet   Oral   Take 180 mg by mouth daily.         . fluticasone (FLONASE) 50 MCG/ACT nasal spray   Each Nare   Place 1 spray into both nostrils daily.   16 g   0    Allergies Buspar; Sulfa antibiotics; Sulfa antibiotics; and Buspar  Family History  Problem Relation Age of Onset  . Cancer Paternal Uncle   . COPD Maternal Grandfather   . Stroke Paternal Grandfather     Social History Social History  Substance Use Topics  . Smoking status: Current Every Day Smoker -- 0.50 packs/day    Types: Cigarettes    Start date: 01/28/2013  .  Smokeless tobacco: Never Used  . Alcohol Use: No   Review of Systems  Constitutional: Negative for fever. Eyes: Negative for visual changes. ENT: Negative for sore throat. Cardiovascular: Negative for chest pain. Respiratory: Negative for shortness of breath. Reports cough as above. Gastrointestinal: Negative for abdominal pain, vomiting and diarrhea. Genitourinary: Negative for dysuria. Musculoskeletal: Negative for back pain. Skin: Negative for rash. Neurological: Negative for headaches, focal weakness or numbness. ____________________________________________  PHYSICAL EXAM:  VITAL SIGNS: ED Triage Vitals  Enc Vitals Group     BP 11/20/15 1543 114/62 mmHg     Pulse Rate 11/20/15 1543 84     Resp 11/20/15 1543 18     Temp 11/20/15 1543 97.6 F (36.4 C)     Temp Source 11/20/15 1543 Oral     SpO2 11/20/15 1543 98 %     Weight 11/20/15 1543 200 lb (90.719 kg)     Height 11/20/15 1543 5\' 6"  (1.676 m)      Head Cir --      Peak Flow --      Pain Score 11/20/15 1544 0     Pain Loc --      Pain Edu? --      Excl. in GC? --    Constitutional: Alert and oriented. Well appearing and in no distress. Head: Normocephalic and atraumatic.      Eyes: Conjunctivae are normal. PERRL. Normal extraocular movements      Ears: Canals clear. TMs intact bilaterally.   Nose: No congestion/rhinorrhea.   Mouth/Throat: Mucous membranes are moist.   Neck: Supple. No thyromegaly. Hematological/Lymphatic/Immunological: No cervical lymphadenopathy. Cardiovascular: Normal rate, regular rhythm.  Respiratory: Normal respiratory effort. No wheezes/rales/rhonchi. Gastrointestinal: Soft and nontender. No distention. Musculoskeletal: Nontender with normal range of motion in all extremities.  Neurologic:  Normal gait without ataxia. Normal speech and language. No gross focal neurologic deficits are appreciated. Skin:  Skin is warm, dry and intact. No rash noted. Psychiatric: Mood and affect are normal. Patient exhibits appropriate insight and judgment. ____________________________________________   RADIOLOGY CXR IMPRESSION: No acute cardiopulmonary disease. Specifically, no evidence of pneumonia. ____________________________________________  INITIAL IMPRESSION / ASSESSMENT AND PLAN / ED COURSE  Patient with cough without evidence of pneumonia. She will be discharged to continue her albuterol as needed. She will dose Tessalon Perles, Flonase, and Bromfed-DM as needed. She may alternatively use an OTC cough syrup for symptoms.  ____________________________________________  FINAL CLINICAL IMPRESSION(S) / ED DIAGNOSES  Final diagnoses:  Cough      Lissa Hoard, PA-C 11/20/15 1853  Sharyn Creamer, MD 11/20/15 2213

## 2015-11-20 NOTE — ED Notes (Signed)
NAD noted at time of D/C. Pt denies questions or concerns. Pt ambulatory to the lobby at this time.  

## 2015-11-21 ENCOUNTER — Ambulatory Visit
Admission: EM | Admit: 2015-11-21 | Discharge: 2015-11-21 | Disposition: A | Discharge status: Home / Self Care | Payer: 59 | Attending: Family Medicine | Admitting: Family Medicine

## 2015-11-21 ENCOUNTER — Encounter: Payer: Self-pay | Admitting: Emergency Medicine

## 2015-11-21 DIAGNOSIS — Z91048 Other nonmedicinal substance allergy status: Secondary | ICD-10-CM | POA: Diagnosis not present

## 2015-11-21 DIAGNOSIS — R059 Cough, unspecified: Secondary | ICD-10-CM

## 2015-11-21 DIAGNOSIS — R05 Cough: Secondary | ICD-10-CM | POA: Diagnosis not present

## 2015-11-21 DIAGNOSIS — Z9109 Other allergy status, other than to drugs and biological substances: Secondary | ICD-10-CM

## 2015-11-21 NOTE — ED Notes (Signed)
Patient was seen and treated at [facility] ED for a cough yesterday.  Patient states that her cough is not better.

## 2015-11-21 NOTE — Discharge Instructions (Signed)
° °  Zyrtec 10 mg once daily ...ok twice daily for 3-5 days  Flonas 1 spray per nostril twice daily x 3-5 days then daily  Benzonatate 100 mg   1-2 tabs as needed for cough Rx cough  1-2 tsp at bedtime, if not driving, you can repeat 1-2 during the days    Allergies An allergy is when your body reacts to a substance in a way that is not normal. An allergic reaction can happen after you:  Eat something.  Breathe in something.  Touch something. WHAT KINDS OF ALLERGIES ARE THERE? You can be allergic to:  Things that are only around during certain seasons, like molds and pollens.  Foods.  Drugs.  Insects.  Animal dander. WHAT ARE SYMPTOMS OF ALLERGIES?  Puffiness (swelling). This may happen on the lips, face, tongue, mouth, or throat.  Sneezing.  Coughing.  Breathing loudly (wheezing).  Stuffy nose.  Tingling in the mouth.  A rash.  Itching.  Itchy, red, puffy areas of skin (hives).  Watery eyes.  Throwing up (vomiting).  Watery poop (diarrhea).  Dizziness.  Feeling faint or fainting.  Trouble breathing or swallowing.  A tight feeling in the chest.  A fast heartbeat. HOW ARE ALLERGIES DIAGNOSED? Allergies can be diagnosed with:  A medical and family history.  Skin tests.  Blood tests.  A food diary. A food diary is a record of all the foods, drinks, and symptoms you have each day.  The results of an elimination diet. This diet involves making sure not to eat certain foods and then seeing what happens when you start eating them again. HOW ARE ALLERGIES TREATED? There is no cure for allergies, but allergic reactions can be treated with medicine. Severe reactions usually need to be treated at a hospital.  HOW CAN REACTIONS BE PREVENTED? The best way to prevent an allergic reaction is to avoid the thing you are allergic to. Allergy shots and medicines can also help prevent reactions in some cases.   This information is not intended to replace  advice given to you by your health care provider. Make sure you discuss any questions you have with your health care provider.   Document Released: 02/26/2013 Document Revised: 11/22/2014 Document Reviewed: 08/13/2014 Elsevier Interactive Patient Education [employer]2016 Elsevier Inc.

## 2015-11-23 ENCOUNTER — Encounter: Payer: Self-pay | Admitting: Physician Assistant

## 2015-11-23 NOTE — ED Provider Notes (Signed)
CSN: [id number]     Arrival date & time 11/21/15  1522 History   First MD Initiated Contact with Patient 11/21/15 1655     Chief Complaint  Patient presents with  . Cough   (Consider location/radiation/quality/duration/timing/severity/associated sxs/prior Treatment) HPI   25 yo F presents with her boyfriend complaining of cough. She was seen yesterday in the ER. CXR was negative and  She was given therapies for intervention which she has not yet initiated. No fever or chills, No night sweats, chest pain, Wheezing. Has known seasonal allergies and hx of asthma. Smokes daily.  Past Medical History  Diagnosis Date  . Asthma   . IBS (irritable bowel syndrome)   . Heart murmur   . Interstitial cystitis   . Anxiety    Past Surgical History  Procedure Laterality Date  . Myringotomy with tube placement    . Upper gi endoscopy    . Tympanostomy tube placement    . No past surgeries    . Cesarean section N/A 03/20/2013    Procedure: CESAREAN SECTION;  Surgeon: [name], MD;  Location: [facility];  Service: Obstetrics;  Laterality: N/A;  . Cesarean section     Family History  Problem Relation Age of Onset  . Cancer Paternal Uncle   . COPD Maternal Grandfather   . Stroke Paternal Grandfather    Social History  Substance Use Topics  . Smoking status: Current Every Day Smoker -- 0.50 packs/day    Types: Cigarettes    Start date: 01/28/2013  . Smokeless tobacco: Never Used  . Alcohol Use: No   OB History    Gravida Para Term Preterm AB TAB SAB Ectopic Multiple Living   1 1 0 1 0 0 0 0  1     Review of Systems Constitutional: No fever. No headache. Eyes: No visual changes. ENT:No sore throat. Cardiovascular:Negative for chest pain/palpitations Respiratory: Negative for shortness of breath, positive cough Gastrointestinal: No abdominal pain. No nausea,vomiting, diarrhea Genitourinary: Negative for dysuria. Normal urination. Musculoskeletal: Negative for back pain. FROM  extremities without pain Skin: Negative for rash Neurological: Negative for headache, focal weakness or numbness  Allergies  Buspar; Sulfa antibiotics; Sulfa antibiotics; and Buspar  Home Medications   Prior to Admission medications   Medication Sig Start Date End Date Taking? Authorizing Provider  ADDERALL XR 30 MG 24 hr capsule Take 1 capsule (30 mg total) by mouth daily. 04/24/15   Babs Sciara, MD  benzonatate (TESSALON PERLES) 100 MG capsule Take 1 capsule (100 mg total) by mouth 3 (three) times daily as needed for cough. 06/09/15   Babs Sciara, MD  benzonatate (TESSALON PERLES) 100 MG capsule Take 1 capsule (100 mg total) by mouth 3 (three) times daily as needed for cough. 10/27/15   Renford Dills, NP  brompheniramine-pseudoephedrine-DM 30-2-10 MG/5ML syrup Take 5 mLs by mouth 4 (four) times daily as needed. 11/20/15   Jenise V Bacon Menshew, PA-C  cetirizine (ZYRTEC) 5 MG tablet Take 1 tablet (5 mg total) by mouth daily. 11/20/15   Jenise V Bacon Menshew, PA-C  citalopram (CELEXA) 40 MG tablet Take 40 mg by mouth daily.    Historical Provider, MD  fexofenadine (ALLEGRA) 180 MG tablet Take 180 mg by mouth daily.    Historical Provider, MD  fluticasone (FLONASE) 50 MCG/ACT nasal spray Place 1 spray into both nostrils daily. 11/20/15   Jenise V Bacon Menshew, PA-C   Meds Ordered and Administered this Visit  Medications - No data to  display  BP 133/71 mmHg  Pulse 86  Temp(Src) 97.9 F (36.6 C) (Tympanic)  Resp 16  Ht 5\' 6"  (1.676 m)  Wt 200 lb (90.719 kg)  BMI 32.30 kg/m2  SpO2 100%  LMP 11/15/2015 (Exact Date) No data found.   Physical Exam   VS noted, WNL GENERAL : NAD HEENT: no pharyngeal erythema,no exudate, no erythema of TMs, no cervical LAD RESP: CTA  B , no wheezing, no accessory muscle use,occassional cough CARD: RRR ABD: Not distended NEURO: Good attention, good recall, no gross neuro defecit PSYCH: speech and behavior appropriate  ED Course  Procedures  (including critical care time)  Labs Review Labs Reviewed - No data to display  Reviewed the exam and CXR she had done yesterday at ER with patient and boyfriend.  Discussed medications that had been recommended and it became clear she had not initiated any of the interventions. Reviewed each previous recommendation and explained to both of them She will get Rx Tessalon, add ceterizine 5 mg to current ALLegra therapy-and be sure to take that one as well. Add FLonase  Encourage po hydration, steamy showers  MDM   1. Cough   2. Environmental allergies    Plan: Yesterday's x-ray results and diagnosis reviewed with patient/partner Rx as per previous orders;  benefits, risks, potential side effects reviewed   Recommend supportive treatment with cyclic tylenol and ibuprofen Steamy showers,vaporizer- STOP SMOKING !-reviewed Diagnosis and treatment discussed.  Questions fielded, expectations and recommendations reviewed. Discussed follow up and return parameters including no resolution or any worsening condition. . Patient expresses understanding  and agrees to plan. [facility] questions, concerns or exacerbation.     Rae HalstedLaurie W Lee, PA-C 11/23/15 2135

## 2015-11-25 ENCOUNTER — Ambulatory Visit (INDEPENDENT_AMBULATORY_CARE_PROVIDER_SITE_OTHER): Payer: 59 | Admitting: Family Medicine

## 2015-11-25 ENCOUNTER — Encounter: Payer: Self-pay | Admitting: Family Medicine

## 2015-11-25 VITALS — BP 102/70 | Temp 98.3°F | Ht 65.0 in | Wt 254.2 lb

## 2015-11-25 DIAGNOSIS — R062 Wheezing: Secondary | ICD-10-CM

## 2015-11-25 DIAGNOSIS — B9689 Other specified bacterial agents as the cause of diseases classified elsewhere: Secondary | ICD-10-CM

## 2015-11-25 DIAGNOSIS — J209 Acute bronchitis, unspecified: Secondary | ICD-10-CM | POA: Diagnosis not present

## 2015-11-25 DIAGNOSIS — J019 Acute sinusitis, unspecified: Secondary | ICD-10-CM | POA: Diagnosis not present

## 2015-11-25 MED ORDER — DOXYCYCLINE HYCLATE 100 MG PO CAPS: 100.0000 mg | ORAL_CAPSULE | Freq: Two times a day (BID) | ORAL | Status: DC

## 2015-11-25 MED ORDER — ALBUTEROL SULFATE HFA 108 (90 BASE) MCG/ACT IN AERS: 2.0000 | INHALATION_SPRAY | Freq: Four times a day (QID) | RESPIRATORY_TRACT | Status: DC | PRN

## 2015-11-25 MED ORDER — PREDNISONE 20 MG PO TABS: ORAL_TABLET | ORAL | Status: DC

## 2015-11-25 NOTE — Progress Notes (Signed)
   Subjective:    Patient ID: Linda [name], female    DOB: Jul 30, 1991, 25 y.o.   MRN: [id number]  Cough This is a new problem. The current episode started 1 to 4 weeks ago. The problem has been unchanged. The cough is productive of sputum. Associated symptoms include chest pain, a fever, shortness of breath and wheezing. Nothing aggravates the symptoms. Treatments tried: Mucinex. The treatment provided no relief.   Patient has no other concerns at this time.  PMH benign  Review of Systems  Constitutional: Positive for fever.  Respiratory: Positive for cough, shortness of breath and wheezing.   Cardiovascular: Positive for chest pain.       Objective:   Physical Exam  Constitutional: She appears well-developed.  HENT:  Head: Normocephalic.  Nose: Nose normal.  Mouth/Throat: Oropharynx is clear and moist. No oropharyngeal exudate.  Neck: Neck supple.  Cardiovascular: Normal rate and normal heart sounds.   No murmur heard. Pulmonary/Chest: Effort normal. She has wheezes. She has no rales. She exhibits no tenderness.  Lymphadenopathy:    She has no cervical adenopathy.  Skin: Skin is warm and dry.  Nursing note and vitals reviewed.   Warning signs regarding respiratory distress pneumonia were discussed    Assessment & Plan:  Patient has combination of a viral illness/rhinosinusitis/acute bronchitis/wheezing-I don't recommend x-rays or lab work. Antibiotics prednisone recommended albuterol also recommended patient should gradually get better over the course of next 7 days. If she gets worse over the next few days call back and we will order x-ray and recheck her

## 2016-01-15 ENCOUNTER — Ambulatory Visit (INDEPENDENT_AMBULATORY_CARE_PROVIDER_SITE_OTHER): Payer: 59 | Admitting: Family Medicine

## 2016-01-15 ENCOUNTER — Encounter: Payer: Self-pay | Admitting: Family Medicine

## 2016-01-15 VITALS — BP 110/68 | Temp 97.8°F | Ht 65.5 in | Wt 259.4 lb

## 2016-01-15 DIAGNOSIS — I889 Nonspecific lymphadenitis, unspecified: Secondary | ICD-10-CM

## 2016-01-15 MED ORDER — AMOXICILLIN 500 MG PO CAPS: 500.0000 mg | ORAL_CAPSULE | Freq: Three times a day (TID) | ORAL | Status: DC

## 2016-01-15 NOTE — Progress Notes (Signed)
   Subjective:    Patient ID: Linda [name], female    DOB: 10/26/1991, 25 y.o.   MRN: [id number]  Sore Throat  This is a new problem. The current episode started in the past 7 days. The pain is worse on the left side. Associated symptoms include ear pain. She has tried acetaminophen (Tylenol) for the symptoms.   No sig cough    patient concerned because of her prior history of throat abscess. Notes no difficulty swallowing. Other than the pain. No difficulty breathing. No high fevers or chills.   Some ear pain right sided worse when swallowing Patient states no other concerns this visit.  Review of Systems  HENT: Positive for ear pain.     no cough no chest pain no GI symptoms    Objective:   Physical Exam   alert vitals stable HEENT slight nasal congestion TMs normal pharynx impressive erythema right side greater than left no swelling positive tender anterior lymph nodes      Assessment & Plan:   impression pharyngitis with secondary cervical lymphadenitis plan antibiotics prescribed. Symptom care discussed warning signs discussed WSL

## 2016-01-29 ENCOUNTER — Other Ambulatory Visit: Payer: Self-pay | Admitting: Family Medicine

## 2016-01-30 NOTE — Telephone Encounter (Signed)
Six mo 

## 2016-02-03 ENCOUNTER — Other Ambulatory Visit: Payer: Self-pay | Admitting: Family Medicine

## 2016-02-06 ENCOUNTER — Other Ambulatory Visit: Payer: Self-pay | Admitting: Family Medicine

## 2016-02-06 NOTE — Telephone Encounter (Signed)
Dr Brett Canalessteve to see- his patient

## 2016-02-23 ENCOUNTER — Telehealth: Payer: Self-pay | Admitting: Family Medicine

## 2016-02-23 NOTE — Telephone Encounter (Signed)
Error

## 2016-02-25 ENCOUNTER — Encounter: Payer: Self-pay | Admitting: Family Medicine

## 2016-02-25 ENCOUNTER — Ambulatory Visit (INDEPENDENT_AMBULATORY_CARE_PROVIDER_SITE_OTHER): Payer: 59 | Admitting: Family Medicine

## 2016-02-25 VITALS — BP 120/74 | Temp 98.2°F | Ht 65.0 in | Wt 259.0 lb

## 2016-02-25 DIAGNOSIS — W57XXXA Bitten or stung by nonvenomous insect and other nonvenomous arthropods, initial encounter: Secondary | ICD-10-CM | POA: Diagnosis not present

## 2016-02-25 DIAGNOSIS — L200 Besnier's prurigo: Secondary | ICD-10-CM | POA: Diagnosis not present

## 2016-02-25 DIAGNOSIS — T148 Other injury of unspecified body region: Secondary | ICD-10-CM | POA: Diagnosis not present

## 2016-02-25 DIAGNOSIS — L239 Allergic contact dermatitis, unspecified cause: Secondary | ICD-10-CM

## 2016-02-25 MED ORDER — TRIAMCINOLONE ACETONIDE 0.1 % EX CREA: 1.0000 "application " | TOPICAL_CREAM | Freq: Two times a day (BID) | CUTANEOUS | Status: DC

## 2016-02-25 MED ORDER — PREDNISONE 20 MG PO TABS: ORAL_TABLET | ORAL | Status: DC

## 2016-02-25 NOTE — Progress Notes (Signed)
   Subjective:    Patient ID: Linda [name], female    DOB: 1991/04/22, 25 y.o.   MRN: [id number]  Rash This is a new problem. Episode onset: 3 days ago. The affected locations include the back (legs). The rash is characterized by itchiness. (Hot flashes) Past treatments include nothing.  Patients had this rash for the past few days denies fever chills sweats denies any other particular troubles    Review of Systems  Skin: Positive for rash.       Objective:   Physical Exam  Multiple bumps on her back some on her arms the bumps on her back itching red and inflamed consistent with bug bites no sign of any pus or drainage      Assessment & Plan:  Allergic bug bites probably from outside source from being in the park the previous weekend. Prednisone taper Benadryl as needed steroid cream follow-up if progressive troubles I doubt bedbugs I doubt scabies

## 2016-03-15 ENCOUNTER — Encounter: Payer: Self-pay | Admitting: Family Medicine

## 2016-03-15 ENCOUNTER — Ambulatory Visit (INDEPENDENT_AMBULATORY_CARE_PROVIDER_SITE_OTHER): Payer: 59 | Admitting: Family Medicine

## 2016-03-15 VITALS — BP 112/80 | Temp 98.3°F | Ht 65.0 in | Wt 258.5 lb

## 2016-03-15 DIAGNOSIS — R300 Dysuria: Secondary | ICD-10-CM

## 2016-03-15 LAB — POCT URINALYSIS DIPSTICK
SPEC GRAV UA: 1.015
pH, UA: 7

## 2016-03-15 LAB — POCT URINE PREGNANCY: PREG TEST UR: NEGATIVE

## 2016-03-15 MED ORDER — CEFPROZIL 500 MG PO TABS: 500.0000 mg | ORAL_TABLET | Freq: Two times a day (BID) | ORAL | Status: DC

## 2016-03-15 NOTE — Progress Notes (Signed)
   Subjective:    Patient ID: Linda [name], female    DOB: 05/17/1991, 25 y.o.   MRN: [id number]  Dysuria  This is a new problem. The current episode started in the past 7 days. The problem occurs intermittently. The problem has been unchanged. The quality of the pain is described as burning. The pain is moderate. There has been no fever. She has tried acetaminophen for the symptoms. The treatment provided no relief.  Cough This is a new problem. The current episode started in the past 7 days. The problem has been unchanged. Associated symptoms include a fever, shortness of breath and wheezing. Pertinent negatives include no chest pain. Associated symptoms comments: congestion. Nothing aggravates the symptoms. Treatments tried: inhaler. The treatment provided no relief.   Patient wants pregnancy test also.  Results for orders placed or performed in visit on 03/15/16  POCT urinalysis dipstick  Result Value Ref Range   Color, UA     Clarity, UA     Glucose, UA     Bilirubin, UA     Ketones, UA     Spec Grav, UA 1.015    Blood, UA     pH, UA 7.0    Protein, UA     Urobilinogen, UA     Nitrite, UA     Leukocytes, UA moderate (2+) (A) Negative  POCT urine pregnancy  Result Value Ref Range   Preg Test, Ur Negative Negative      Review of Systems  Constitutional: Positive for fever and fatigue.  HENT: Positive for congestion.   Respiratory: Positive for cough, shortness of breath and wheezing.   Cardiovascular: Negative for chest pain.  Gastrointestinal: Positive for abdominal pain.  Genitourinary: Positive for dysuria.       Objective:   Physical Exam Eardrums normal throat is normal neck is supple lungs are clear no crackles lower abdomen left lower abdomen mild tenderness no guarding or rebound Urinalysis with some WBCs.       Assessment & Plan:  Viral syndrome Secondary rhinosinusitis UTI Cefzil twice a day 10 days Warning signs discussed Follow-up if  progressive troubles

## 2016-05-14 ENCOUNTER — Ambulatory Visit
Admission: EM | Admit: 2016-05-14 | Discharge: 2016-05-14 | Disposition: A | Discharge status: Home / Self Care | Payer: 59 | Attending: Family Medicine | Admitting: Family Medicine

## 2016-05-14 ENCOUNTER — Encounter: Payer: Self-pay | Admitting: *Deleted

## 2016-05-14 DIAGNOSIS — N939 Abnormal uterine and vaginal bleeding, unspecified: Secondary | ICD-10-CM | POA: Diagnosis not present

## 2016-05-14 LAB — URINALYSIS COMPLETE WITH MICROSCOPIC (ARMC ONLY)
Bacteria, UA: NONE SEEN
Bilirubin Urine: NEGATIVE
Glucose, UA: NEGATIVE mg/dL
KETONES UR: NEGATIVE mg/dL
Leukocytes, UA: NEGATIVE
Nitrite: NEGATIVE
PROTEIN: 30 mg/dL — AB
Specific Gravity, Urine: 1.02 (ref 1.005–1.030)
pH: 6 (ref 5.0–8.0)

## 2016-05-14 LAB — PREGNANCY, URINE: PREG TEST UR: NEGATIVE

## 2016-05-14 NOTE — ED Notes (Signed)
Vaginal bleeding, suprapubic pain, onset last night.

## 2016-05-14 NOTE — Discharge Instructions (Signed)

## 2016-05-14 NOTE — ED Provider Notes (Signed)
CSN: [id number]     Arrival date & time 05/14/16  [id number] History   First MD Initiated Contact with Patient 05/14/16 2000     Chief Complaint  Patient presents with  . Vaginal Bleeding  . Abdominal Pain   (Consider location/radiation/quality/duration/timing/severity/associated sxs/prior Treatment) HPI Comments: 25 yo female with a c/o vaginal bleeding since last night and mild pelvic discomfort. Denies any fevers, chills, injuries/trauma. States had a normal menstrual period 2 weeks ago. Patient also states has a h/o miscarriage in the past.   Patient is a 25 y.o. female presenting with vaginal bleeding and abdominal pain.  Vaginal Bleeding Associated symptoms: abdominal pain   Abdominal Pain Associated symptoms: vaginal bleeding     Past Medical History  Diagnosis Date  . Asthma   . IBS (irritable bowel syndrome)   . Heart murmur   . Interstitial cystitis   . Anxiety    Past Surgical History  Procedure Laterality Date  . Myringotomy with tube placement    . Upper gi endoscopy    . Tympanostomy tube placement    . No past surgeries    . Cesarean section N/A 03/20/2013    Procedure: CESAREAN SECTION;  Surgeon: Oliver PilaKathy W Richardson, MD;  Location: [facility];  Service: Obstetrics;  Laterality: N/A;  . Cesarean section     Family History  Problem Relation Age of Onset  . Cancer Paternal Uncle   . COPD Maternal Grandfather   . Stroke Paternal Grandfather    Social History  Substance Use Topics  . Smoking status: Current Every Day Smoker -- 0.50 packs/day    Types: Cigarettes    Start date: 01/28/2013  . Smokeless tobacco: Never Used  . Alcohol Use: No   OB History    Gravida Para Term Preterm AB TAB SAB Ectopic Multiple Living   1 1 0 1 0 0 0 0  1     Review of Systems  Gastrointestinal: Positive for abdominal pain.  Genitourinary: Positive for vaginal bleeding.    Allergies  Buspar; Sulfa antibiotics; Sulfa antibiotics; and Buspar  Home Medications   Prior to  Admission medications   Medication Sig Start Date End Date Taking? Authorizing Provider  albuterol (PROVENTIL HFA;VENTOLIN HFA) 108 (90 Base) MCG/ACT inhaler Inhale 2 puffs into the lungs every 6 (six) hours as needed for wheezing. 11/25/15  Yes Babs SciaraScott A Luking, MD  citalopram (CELEXA) 40 MG tablet TAKE 1 TABLET BY MOUTH EVERY DAY, NEEDS OFFICE VISIT FOR FURTHER REFILLS 02/09/16  Yes Merlyn AlbertWilliam S Luking, MD  ADDERALL XR 30 MG 24 hr capsule Take 1 capsule (30 mg total) by mouth daily. 04/24/15   Babs SciaraScott A Luking, MD  amoxicillin (AMOXIL) 500 MG capsule Take 1 capsule (500 mg total) by mouth 3 (three) times daily. Patient not taking: Reported on 02/25/2016 01/15/16   Merlyn AlbertWilliam S Luking, MD  benzonatate (TESSALON PERLES) 100 MG capsule Take 1 capsule (100 mg total) by mouth 3 (three) times daily as needed for cough. Patient not taking: Reported on 01/15/2016 06/09/15   Babs SciaraScott A Luking, MD  benzonatate (TESSALON PERLES) 100 MG capsule Take 1 capsule (100 mg total) by mouth 3 (three) times daily as needed for cough. Patient not taking: Reported on 01/15/2016 10/27/15   Renford DillsLindsey Miller, NP  brompheniramine-pseudoephedrine-DM 30-2-10 MG/5ML syrup Take 5 mLs by mouth 4 (four) times daily as needed. Patient not taking: Reported on 01/15/2016 11/20/15   Charlesetta IvoryJenise V Bacon Menshew, PA-C  cefPROZIL (CEFZIL) 500 MG tablet Take 1 tablet (500 mg  total) by mouth 2 (two) times daily. 03/15/16   Babs SciaraScott A Luking, MD  cetirizine (ZYRTEC) 5 MG tablet Take 1 tablet (5 mg total) by mouth daily. 11/20/15   Jenise V Bacon Menshew, PA-C  doxycycline (VIBRAMYCIN) 100 MG capsule Take 1 capsule (100 mg total) by mouth 2 (two) times daily. Patient not taking: Reported on 01/15/2016 11/25/15   Babs SciaraScott A Luking, MD  fexofenadine (ALLEGRA) 180 MG tablet Take 180 mg by mouth daily. Reported on 01/15/2016    Historical Provider, MD  fluticasone (FLONASE) 50 MCG/ACT nasal spray Place 1 spray into both nostrils daily. 11/20/15   Jenise V Bacon Menshew, PA-C  predniSONE  (DELTASONE) 20 MG tablet 3qd for 3d then 2qd for 3d then 1qd for 3d Patient not taking: Reported on 03/15/2016 02/25/16   Babs SciaraScott A Luking, MD  triamcinolone cream (KENALOG) 0.1 % Apply 1 application topically 2 (two) times daily. 02/25/16   Babs SciaraScott A Luking, MD   Meds Ordered and Administered this Visit  Medications - No data to display  BP 124/80 mmHg  Pulse 78  Temp(Src) 98.1 F (36.7 C) (Oral)  Resp 16  Ht 5\' 6"  (1.676 m)  Wt 200 lb (90.719 kg)  BMI 32.30 kg/m2  SpO2 99%  LMP 04/30/2016 (Exact Date) No data found.   Physical Exam  Constitutional: She appears well-developed and well-nourished. No distress.  Abdominal: Soft. Bowel sounds are normal. She exhibits no distension and no mass. There is no tenderness. There is no rebound and no guarding.  Skin: She is not diaphoretic.  Nursing note and vitals reviewed.   ED Course  Procedures (including critical care time)  Labs Review Labs Reviewed  URINALYSIS COMPLETEWITH MICROSCOPIC ([facility] ONLY) - Abnormal; Notable for the following:    Color, Urine RED (*)    APPearance CLOUDY (*)    Hgb urine dipstick LARGE (*)    Protein, ur 30 (*)    Squamous Epithelial / LPF 6-30 (*)    All other components within normal limits  PREGNANCY, URINE    Imaging Review No results found.   Visual Acuity Review  Right Eye Distance:   Left Eye Distance:   Bilateral Distance:    Right Eye Near:   Left Eye Near:    Bilateral Near:         MDM   1. Vaginal bleeding    (unknown etiology)   Discussed with patient possible etiologies and recommendation for further evaluation at hospital ED (possible imaging with pelvic US)    Payton Mccallumrlando Lesleyanne Politte, MD 05/14/16 2036

## 2016-05-28 ENCOUNTER — Ambulatory Visit
Admission: EM | Admit: 2016-05-28 | Discharge: 2016-05-28 | Disposition: A | Discharge status: Home / Self Care | Payer: 59 | Attending: Emergency Medicine | Admitting: Emergency Medicine

## 2016-05-28 ENCOUNTER — Other Ambulatory Visit: Payer: Self-pay

## 2016-05-28 ENCOUNTER — Encounter: Payer: Self-pay | Admitting: Gynecology

## 2016-05-28 DIAGNOSIS — Z79899 Other long term (current) drug therapy: Secondary | ICD-10-CM | POA: Insufficient documentation

## 2016-05-28 DIAGNOSIS — R079 Chest pain, unspecified: Secondary | ICD-10-CM | POA: Diagnosis not present

## 2016-05-28 DIAGNOSIS — K029 Dental caries, unspecified: Secondary | ICD-10-CM | POA: Diagnosis present

## 2016-05-28 DIAGNOSIS — F17210 Nicotine dependence, cigarettes, uncomplicated: Secondary | ICD-10-CM | POA: Diagnosis not present

## 2016-05-28 DIAGNOSIS — K047 Periapical abscess without sinus: Secondary | ICD-10-CM | POA: Insufficient documentation

## 2016-05-28 DIAGNOSIS — R0789 Other chest pain: Secondary | ICD-10-CM | POA: Diagnosis not present

## 2016-05-28 DIAGNOSIS — Z9889 Other specified postprocedural states: Secondary | ICD-10-CM | POA: Insufficient documentation

## 2016-05-28 MED ORDER — ACETAMINOPHEN 500 MG PO TABS: 1000.0000 mg | ORAL_TABLET | Freq: Four times a day (QID) | ORAL | Status: DC | PRN

## 2016-05-28 MED ORDER — CLINDAMYCIN HCL 300 MG PO CAPS: 300.0000 mg | ORAL_CAPSULE | Freq: Three times a day (TID) | ORAL | Status: AC

## 2016-05-28 MED ORDER — IBUPROFEN 800 MG PO TABS: 800.0000 mg | ORAL_TABLET | Freq: Three times a day (TID) | ORAL | Status: DC

## 2016-05-28 NOTE — Discharge Instructions (Signed)
Dental Abscess °A dental abscess is a collection of pus in or around a tooth. °CAUSES °This condition is caused by a bacterial infection around the root of the tooth that involves the inner part of the tooth (pulp). It may result from: °· Severe tooth decay. °· Trauma to the tooth that allows bacteria to enter into the pulp, such as a broken or chipped tooth. °· Severe gum disease around a tooth. °SYMPTOMS °Symptoms of this condition include: °· Severe pain in and around the infected tooth. °· Swelling and redness around the infected tooth, in the mouth, or in the face. °· Tenderness. °· Pus drainage. °· Bad breath. °· Bitter taste in the mouth. °· Difficulty swallowing. °· Difficulty opening the mouth. °· Nausea. °· Vomiting. °· Chills. °· Swollen neck glands. °· Fever. °DIAGNOSIS °This condition is diagnosed with examination of the infected tooth. During the exam, your dentist may tap on the infected tooth. Your dentist will also ask about your medical and dental history and may order X-rays. °TREATMENT °This condition is treated by eliminating the infection. This may be done with: °· Antibiotic medicine. °· A root canal. This may be performed to save the tooth. °· Pulling (extracting) the tooth. This may also involve draining the abscess. This is done if the tooth cannot be saved. °HOME CARE INSTRUCTIONS °· Take medicines only as directed by your dentist. °· If you were prescribed antibiotic medicine, finish all of it even if you start to feel better. °· Rinse your mouth (gargle) often with salt water to relieve pain or swelling. °· Do not drive or operate heavy machinery while taking pain medicine. °· Do not apply heat to the outside of your mouth. °· Keep all follow-up visits as directed by your dentist. This is important. °SEEK MEDICAL CARE IF: °· Your pain is worse and is not helped by medicine. °SEEK IMMEDIATE MEDICAL CARE IF: °· You have a fever or chills. °· Your symptoms suddenly get worse. °· You have a  very bad headache. °· You have problems breathing or swallowing. °· You have trouble opening your mouth. °· You have swelling in your neck or around your eye. °  °This information is not intended to replace advice given to you by your health care provider. Make sure you discuss any questions you have with your health care provider. °  °Document Released: 11/01/2005 Document Revised: 03/18/2015 Document Reviewed: 10/29/2014 °Elsevier Interactive Patient Education ©2016 [employer] ° °Dental Care and Dentist Visits °Dental care supports good overall health. Regular dental visits can also help you avoid dental pain, bleeding, infection, and other more serious health problems in the future. It is important to keep the mouth healthy because diseases in the teeth, gums, and other oral tissues can spread to other areas of the body. Some problems, such as diabetes, heart disease, and pre-term labor have been associated with poor oral health.  °See your dentist every 6 months. If you experience emergency problems such as a toothache or broken tooth, go to the dentist right away. If you see your dentist regularly, you may catch problems early. It is easier to be treated for problems in the early stages.  °WHAT TO EXPECT AT A DENTIST VISIT  °Your dentist will look for many common oral health problems and recommend proper treatment. At your regular dental visit, you can expect: °· Gentle cleaning of the teeth and gums. This includes scraping and polishing. This helps to remove the sticky substance around the teeth and gums (  plaque). Plaque forms in the mouth shortly after eating. Over time, plaque hardens on the teeth as tartar. If tartar is not removed regularly, it can cause problems. Cleaning also helps remove stains.  Periodic X-rays. These pictures of the teeth and supporting bone will help your dentist assess the health of your teeth.  Periodic fluoride treatments. Fluoride is a natural mineral shown to help  strengthen teeth. Fluoride treatmentinvolves applying a fluoride gel or varnish to the teeth. It is most commonly done in children.  Examination of the mouth, tongue, jaws, teeth, and gums to look for any oral health problems, such as:  Cavities (dental caries). This is decay on the tooth caused by plaque, sugar, and acid in the mouth. It is best to catch a cavity when it is small.  Inflammation of the gums caused by plaque buildup (gingivitis).  Problems with the mouth or malformed or misaligned teeth.  Oral cancer or other diseases of the soft tissues or jaws. KEEP YOUR TEETH AND GUMS HEALTHY For healthy teeth and gums, follow these general guidelines as well as your dentist's specific advice:  Have your teeth professionally cleaned at the dentist every 6 months.  Brush twice daily with a fluoride toothpaste.  Floss your teeth daily.  Ask your dentist if you need fluoride supplements, treatments, or fluoride toothpaste.  Eat a healthy diet. Reduce foods and drinks with added sugar.  Avoid smoking. TREATMENT FOR ORAL HEALTH PROBLEMS If you have oral health problems, treatment varies depending on the conditions present in your teeth and gums.  Your caregiver will most likely recommend good oral hygiene at each visit.  For cavities, gingivitis, or other oral health disease, your caregiver will perform a procedure to treat the problem. This is typically done at a separate appointment. Sometimes your caregiver will refer you to another dental specialist for specific tooth problems or for surgery. SEEK IMMEDIATE DENTAL CARE IF:  You have pain, bleeding, or soreness in the gum, tooth, jaw, or mouth area.  A permanent tooth becomes loose or separated from the gum socket.  You experience a blow or injury to the mouth or jaw area.   This information is not intended to replace advice given to you by your health care provider. Make sure you discuss any questions you have with your  health care provider.   Document Released: 07/14/2011 Document Revised: 01/24/2012 Document Reviewed: 07/14/2011 Elsevier Interactive Patient Education 2016 [employer] Endocarditis Endocarditis is an infection of the inner layer of the heart (endocardium) or of the heart valves. Endocarditis can cause growths inside the heart or on the heart valves. These growths can destroy heart tissue and cause heart failure over time. They can also cause stroke if they break away and form a blood clot in the brain. CAUSES  Endocarditis is caused by germs that normally live in or on your body. The germs that most commonly cause endocarditis are bacteria, but fungi can also cause endocarditis. RISK FACTORS Risk factors include:  Having a heart defect.  Having artificial (prosthetic) heart valves.  Having an abnormal or damaged heart valve.  Having a history of endocarditis.  Having had a heart transplant. SIGNS AND SYMPTOMS Signs and symptoms may start suddenly, or they may start slowly and gradually get worse. Symptoms include:  Fever.  Chills.  Night sweats.  Muscle aches.  Fatigue.  Weakness.  Shortness of breath. Signs include:  An abnormal heart sound (murmur).  Retinal bleeding.  Bleeding under the nails of your fingers  or toes.  Painless red spots on your palms.  Painful lumps in your fingertips or toes.  Swelling in your feet or ankles. DIAGNOSIS  To make a diagnosis, your health care provider may:  Perform a physical exam. During the exam he or she will listen to your heart to check for a murmur. He or she may also use a scope to check for bleeding in your retinas.  Order tests. They may include:  Blood tests to look for the germs that cause endocarditis.  An echocardiogram to create an image of your heart. TREATMENT Early treatment offers the best chance for curing endocarditis and preventing complications. Treatment depends on the cause of the  endocarditis. Treatment may include:  Antibiotic medicines. These may be given through an IV tube or taken orally.  Surgery to replace your heart valve. You may need surgery if:  The endocarditis does not respond to treatment.  You develop complications.  Your heart valve is severely damaged. HOME CARE INSTRUCTIONS  Take your antibiotic as directed by your health care provider. Finish the antibiotic even if you start to feel better.  Gradually resume your usual activities.  Let your health care provider know before you have any dental or surgical procedures. You may need to take antibiotics before the procedure.  Let all your health care providers, including your dentist, know that you have had endocarditis.  Do not get tattoos or body piercings.  Do not use IV drugs unless it is part of your medical treatment.  Practice good oral hygiene. This includes:  Brushing and flossing regularly.  Scheduling routine dental appointments. SEEK MEDICAL CARE IF:  You have a fever.  Your symptoms do not improve.  Your symptoms get worse.  Your symptoms come back. SEEK IMMEDIATE MEDICAL CARE IF:  You have trouble breathing.  You have chest pain.  You have symptoms of stroke. These include:  Sudden weakness.  Numbness.  Confusion.  Trouble talking.  A severe headache.   This information is not intended to replace advice given to you by your health care provider. Make sure you discuss any questions you have with your health care provider.   Document Released: 11/01/2005 Document Revised: 11/22/2014 Document Reviewed: 06/18/2014 Elsevier Interactive Patient Education 2016 [employer] Angina Pectoris Angina pectoris, often called angina, is extreme discomfort in the chest, neck, or arm. This is caused by a lack of blood in the middle and thickest layer of the heart wall (myocardium). There are four types of angina:  Stable angina. Stable angina usually occurs in  episodes of predictable frequency and duration. It is usually brought on by physical activity, stress, or excitement. Stable angina usually lasts a few minutes and can often be relieved by a medicine that you place under your tongue. This medicine is called sublingual nitroglycerin.  Unstable angina. Unstable angina can occur even when you are doing little or no physical activity. It can even occur while you are sleeping or when you are at rest. It can suddenly increase in severity or frequency. It may not be relieved by sublingual nitroglycerin, and it can last up to 30 minutes.  Microvascular angina. This type of angina is caused by a disorder of tiny blood vessels called arterioles. Microvascular angina is more common in women. The pain may be more severe and last longer than other types of angina pectoris.  Prinzmetal or variant angina. This type of angina pectoris is rare and usually occurs when you are doing little or no physical  activity. It especially occurs in the early morning hours. CAUSES Atherosclerosis is the cause of angina. This is the buildup of fat and cholesterol (plaque) on the inside of the arteries. Over time, the plaque may narrow or block the artery, and this will lessen blood flow to the heart. Plaque can also become weak and break off within a coronary artery to form a clot and cause a sudden blockage. RISK FACTORS Risk factors common to both men and women include:  High cholesterol levels.  High blood pressure (hypertension).  Tobacco use.  Diabetes.  Family history of angina.  Obesity.  Lack of exercise.  A diet high in saturated fats. Women are at greater risk for angina if they are:  Over age 23.  Postmenopausal. SYMPTOMS Many people do not experience any symptoms during the early stages of angina. As the condition progresses, symptoms common to both men and women may include:  Chest pain.  The pain can be described as a crushing or squeezing in the  chest, or a tightness, pressure, fullness, or heaviness in the chest.  The pain can last more than a few minutes, or it can stop and recur.  Pain in the arms, neck, jaw, or back.  Unexplained heartburn or indigestion.  Shortness of breath.  Nausea.  Sudden cold sweats.  Sudden light-headedness. Many women have chest discomfort and some of the other symptoms. However, women often have different (atypical) symptoms, such as:   Fatigue.  Unexplained feelings of nervousness or anxiety.  Unexplained weakness.  Dizziness or fainting. Sometimes, women may have angina without any symptoms. DIAGNOSIS  Tests to diagnose angina may include:  ECG (electrocardiogram).  Exercise stress test. This looks for signs of blockage when the heart is being exercised.  Pharmacologic stress test. This test looks for signs of blockage when the heart is being stressed with a medicine.  Blood tests.  Coronary angiogram. This is a procedure to look at the coronary arteries to see if there is any blockage. TREATMENT  The treatment of angina may include the following:  Healthy behavioral changes to reduce or control risk factors.  Medicine.  Coronary stenting.A stent helps to keep an artery open.  Coronary angioplasty. This procedure widens a narrowed or blocked artery.  Coronary arterybypass surgery. This will allow your blood to pass the blockage (bypass) to reach your heart. HOME CARE INSTRUCTIONS   Take medicines only as directed by your health care provider.  Do not take the following medicines unless your health care provider approves:  Nonsteroidal anti-inflammatory drugs (NSAIDs), such as ibuprofen, naproxen, or celecoxib.  Vitamin supplements that contain vitamin A, vitamin E, or both.  Hormone replacement therapy that contains estrogen with or without progestin.  Manage other health conditions such as hypertension and diabetes as directed by your health care  provider.  Follow a heart-healthy diet. A dietitian can help to educate you about healthy food options and changes.  Use healthy cooking methods such as roasting, grilling, broiling, baking, poaching, steaming, or stir-frying. Talk to a dietitian to learn more about healthy cooking methods.  Follow an exercise program approved by your health care provider.  Maintain a healthy weight. Lose weight as approved by your health care provider.  Plan rest periods when fatigued.  Learn to manage stress.  Do not use any tobacco products, including cigarettes, chewing tobacco, or electronic cigarettes. If you need help quitting, ask your health care provider.  If you drink alcohol, and your health care provider approves, limit your  alcohol intake to no more than 1 drink per day. One drink equals 12 ounces of beer, 5 ounces of wine, or 1 ounces of hard liquor.  Stop illegal drug use.  Keep all follow-up visits as directed by your health care provider. This is important. SEEK IMMEDIATE MEDICAL CARE IF:   You have pain in your chest, neck, arm, jaw, stomach, or back that lasts more than a few minutes, is recurring, or is unrelieved by taking sublingualnitroglycerin.  You have profuse sweating without cause.  You have unexplained:  Heartburn or indigestion.  Shortness of breath or difficulty breathing.  Nausea or vomiting.  Fatigue.  Feelings of nervousness or anxiety.  Weakness.  Diarrhea.  You have sudden light-headedness or dizziness.  You faint. These symptoms may represent a serious problem that is an emergency. Do not wait to see if the symptoms will go away. Get medical help right away. Call your local emergency services (911 in the [place] Do not drive yourself to the hospital.   This information is not intended to replace advice given to you by your health care provider. Make sure you discuss any questions you have with your health care provider.   Document Released:  11/01/2005 Document Revised: 11/22/2014 Document Reviewed: 03/05/2014 Elsevier Interactive Patient Education [employer]

## 2016-05-28 NOTE — ED Notes (Signed)
Patient c/o upper left dental pain. Per patient unable to see her dentist.  Patient stated History of heart murmurs and 2 hours ago left chest pain.

## 2016-05-28 NOTE — ED Provider Notes (Signed)
CSN: [id number]     Arrival date & time 05/28/16  1731 History   First MD Initiated Contact with Patient 05/28/16 1755     Chief Complaint  Patient presents with  . Dental Pain   (Consider location/radiation/quality/duration/timing/severity/associated sxs/prior Treatment) HPI Comments: Single caucasian female here for re-evaluation of swelling left upper gums/tooth pain.  Was started on amoxicillin earlier this week and initially swelling and pain decreased with amoxicillin and motrin  po TID prn but swelling worsening again yesterday along with pain still taking both medications.  She also started to have left upper chest pain at school/work today.  If she rests decreases to 5/10 sitting up/exerting 8/10.  Last episode of similar pain 3 years ago.  Has intermittent cardiac murmur but all work ups otherwise normal and no one able to explain why she gets this chest pain per patient.  Denied sick contacts.  PMHx obesity, murmur cardiac, recurrent chest pain, dental infections  Patient is a 25 y.o. female presenting with tooth pain. The history is provided by the patient.  Dental Pain Location:  Upper Upper teeth location:  14/LU 1st molar, 15/LU 2nd molar and 16/LU 3rd molar Quality:  Aching Severity:  Moderate Onset quality:  Sudden Timing:  Constant Progression:  Worsening Chronicity:  Recurrent Context: cap still on, not crown fracture, filling still in place, not malocclusion, not recent dental surgery and not trauma   Worsened by:  Touching Ineffective treatments:  Rest and NSAIDs Associated symptoms: gum swelling and oral lesions   Associated symptoms: no congestion, no difficulty swallowing, no drooling, no facial pain, no facial swelling, no fever, no headaches, no neck pain, no neck swelling, no oral bleeding and no trismus   Risk factors: smoking     Past Medical History  Diagnosis Date  . Asthma   . IBS (irritable bowel syndrome)   . Heart murmur   . Interstitial  cystitis   . Anxiety    Past Surgical History  Procedure Laterality Date  . Myringotomy with tube placement    . Upper gi endoscopy    . Tympanostomy tube placement    . No past surgeries    . Cesarean section N/A 03/20/2013    Procedure: CESAREAN SECTION;  Surgeon: [name], MD;  Location: [facility];  Service: Obstetrics;  Laterality: N/A;  . Cesarean section     Family History  Problem Relation Age of Onset  . Cancer Paternal Uncle   . COPD Maternal Grandfather   . Stroke Paternal Grandfather    Social History  Substance Use Topics  . Smoking status: Current Every Day Smoker -- 0.50 packs/day    Types: Cigarettes    Start date: 01/28/2013  . Smokeless tobacco: Never Used  . Alcohol Use: No   OB History    Gravida Para Term Preterm AB TAB SAB Ectopic Multiple Living   1 1 0 1 0 0 0 0  1     Review of Systems  Constitutional: Negative for fever, chills, diaphoresis, activity change, appetite change, fatigue and unexpected weight change.  HENT: Positive for dental problem and mouth sores. Negative for congestion, drooling, ear discharge, ear pain, facial swelling, hearing loss, nosebleeds, postnasal drip, rhinorrhea, sinus pressure, sneezing, sore throat, tinnitus, trouble swallowing and voice change.   Eyes: Negative for photophobia, pain, discharge, redness, itching and visual disturbance.  Respiratory: Negative for cough, choking, chest tightness, shortness of breath, wheezing and stridor.   Cardiovascular: Positive for chest pain. Negative for palpitations and  leg swelling.  Gastrointestinal: Negative for nausea, vomiting, abdominal pain, diarrhea, constipation, blood in stool and abdominal distention.  Endocrine: Negative for cold intolerance and heat intolerance.  Genitourinary: Negative for dysuria, hematuria and difficulty urinating.  Musculoskeletal: Negative for myalgias, back pain, joint swelling, arthralgias, gait problem, neck pain and neck stiffness.  Skin:  Negative for color change, pallor, rash and wound.  Allergic/Immunologic: Positive for environmental allergies. Negative for food allergies.  Neurological: Negative for dizziness, tremors, seizures, syncope, facial asymmetry, speech difficulty, weakness, light-headedness, numbness and headaches.  Hematological: Negative for adenopathy. Does not bruise/bleed easily.  Psychiatric/Behavioral: Negative for behavioral problems, confusion, sleep disturbance and agitation.    Allergies  Buspar; Sulfa antibiotics; Sulfa antibiotics; and Buspar  Home Medications   Prior to Admission medications   Medication Sig Start Date End Date Taking? Authorizing Provider  albuterol (PROVENTIL HFA;VENTOLIN HFA) 108 (90 Base) MCG/ACT inhaler Inhale 2 puffs into the lungs every 6 (six) hours as needed for wheezing. 11/25/15  Yes Babs SciaraScott A Luking, MD  citalopram (CELEXA) 40 MG tablet TAKE 1 TABLET BY MOUTH EVERY DAY, NEEDS OFFICE VISIT FOR FURTHER REFILLS 02/09/16  Yes Merlyn AlbertWilliam S Luking, MD  acetaminophen (TYLENOL) 500 MG tablet Take 2 tablets (1,000 mg total) by mouth every 6 (six) hours as needed for mild pain or moderate pain. 05/28/16   Barbaraann Barthelina A Betancourt, NP  amoxicillin (AMOXIL) 500 MG capsule Take 1 capsule (500 mg total) by mouth 3 (three) times daily. Patient not taking: Reported on 02/25/2016 01/15/16   Merlyn AlbertWilliam S Luking, MD  clindamycin (CLEOCIN) 300 MG capsule Take 1 capsule (300 mg total) by mouth 3 (three) times daily. 05/28/16 06/06/16  Barbaraann Barthelina A Betancourt, NP  ibuprofen (ADVIL,MOTRIN) 800 MG tablet Take 1 tablet (800 mg total) by mouth 3 (three) times daily. 05/28/16   Barbaraann Barthelina A Betancourt, NP   Meds Ordered and Administered this Visit  Medications - No data to display  BP 121/60 mmHg  Pulse 91  Temp(Src) 97.9 F (36.6 C) (Oral)  Resp 16  Ht 5\' 5"  (1.651 m)  Wt 200 lb (90.719 kg)  BMI 33.28 kg/m2  SpO2 99%  LMP 05/14/2016 No data found.   Physical Exam  Constitutional: She is oriented to person, place,  and time. Vital signs are normal. She appears well-developed and well-nourished. She is active and cooperative.  Non-toxic appearance. She does not have a sickly appearance. She does not appear ill. No distress.  HENT:  Head: Normocephalic and atraumatic.  Right Ear: Hearing, external ear and ear canal normal. A middle ear effusion is present.  Left Ear: Hearing, external ear and ear canal normal. A middle ear effusion is present.  Nose: Mucosal edema and rhinorrhea present. No nose lacerations, sinus tenderness, nasal deformity, septal deviation or nasal septal hematoma. No epistaxis.  No foreign bodies. Right sinus exhibits no maxillary sinus tenderness and no frontal sinus tenderness. Left sinus exhibits no maxillary sinus tenderness and no frontal sinus tenderness.  Mouth/Throat: Uvula is midline and mucous membranes are normal. Mucous membranes are not pale, not dry and not cyanotic. She does not have dentures. Oral lesions present. No trismus in the jaw. Normal dentition. No dental abscesses, uvula swelling, lacerations or dental caries. Posterior oropharyngeal edema and posterior oropharyngeal erythema present. No oropharyngeal exudate or tonsillar abscesses.    Cobblestoning posterior pharynx; bilateral TMs with air fluid level clear  Eyes: Conjunctivae, EOM and lids are normal. Pupils are equal, round, and reactive to light. Right eye exhibits no chemosis, no discharge,  no exudate and no hordeolum. No foreign body present in the right eye. Left eye exhibits no chemosis, no discharge, no exudate and no hordeolum. No foreign body present in the left eye. Right conjunctiva is not injected. Right conjunctiva has no hemorrhage. Left conjunctiva is not injected. Left conjunctiva has no hemorrhage. No scleral icterus. Right eye exhibits normal extraocular motion and no nystagmus. Left eye exhibits normal extraocular motion and no nystagmus. Right pupil is round and reactive. Left pupil is round and  reactive. Pupils are equal.  Neck: Trachea normal and normal range of motion. Neck supple. No tracheal tenderness, no spinous process tenderness and no muscular tenderness present. No rigidity. No tracheal deviation, no edema, no erythema and normal range of motion present. No thyroid mass and no thyromegaly present.  Cardiovascular: Normal rate, regular rhythm, S1 normal, S2 normal, normal heart sounds and intact distal pulses.  PMI is not displaced.  Exam reveals no gallop and no friction rub.   No murmur heard. Pulses:      Radial pulses are 2+ on the right side, and 2+ on the left side.  Pulmonary/Chest: Effort normal and breath sounds normal. No accessory muscle usage or stridor. No respiratory distress. She has no decreased breath sounds. She has no wheezes. She has no rhonchi. She has no rales. She exhibits no tenderness.  Abdominal: Soft. Normal appearance. She exhibits no distension.  Musculoskeletal: Normal range of motion. She exhibits no edema or tenderness.       Right shoulder: Normal.       Left shoulder: Normal.       Right hip: Normal.       Left hip: Normal.       Right knee: Normal.       Left knee: Normal.       Right ankle: Normal.       Left ankle: Normal.       Cervical back: Normal.       Thoracic back: Normal.       Lumbar back: Normal.       Right hand: Normal.       Left hand: Normal.  Lymphadenopathy:       Head (right side): No submental, no submandibular, no tonsillar, no preauricular, no posterior auricular and no occipital adenopathy present.       Head (left side): No submental, no submandibular, no tonsillar, no preauricular, no posterior auricular and no occipital adenopathy present.    She has no cervical adenopathy.       Right cervical: No superficial cervical, no deep cervical and no posterior cervical adenopathy present.      Left cervical: No superficial cervical, no deep cervical and no posterior cervical adenopathy present.  Neurological: She is  alert and oriented to person, place, and time. She has normal strength. She is not disoriented. She displays no atrophy and no tremor. No cranial nerve deficit or sensory deficit. She exhibits normal muscle tone. She displays no seizure activity. Coordination and gait normal. GCS eye subscore is 4. GCS verbal subscore is 5. GCS motor subscore is 6.  Skin: Skin is warm, dry and intact. No abrasion, no bruising, no burn, no ecchymosis, no laceration, no lesion, no petechiae and no rash noted. She is not diaphoretic. No cyanosis or erythema. No pallor. Nails show no clubbing.  Psychiatric: She has a normal mood and affect. Her speech is normal and behavior is normal. Judgment and thought content normal. Cognition and memory are normal.  Nursing  note and vitals reviewed.   ED Course  ED EKG  Date/Time: 05/28/2016 6:24 PM Performed by: Albina Billet A Authorized by: Barbaraann Barthel Interpreted by ED physician Comparison: compared with previous ECG from 06/06/2015 Similar to previous ECG Rhythm: sinus rhythm Rate: normal QRS axis: normal Conduction: conduction normal ST Segments: ST segments normal T Waves: T waves normal Other: no other findings Clinical impression: normal ECG Comments: Patient reported left sided anterior chest pain during ECG 5/10 lying on back on gurney VSS Vent rate 83bpm PR interval QRS duration 80ms QT/QTc 382/450ms PRT axes 21 11 20    (including critical care time)  Labs Review Labs Reviewed - No data to display  Imaging Review No results found.  1800 EKG ordered pending by CMA Ancil Boozer; patient lying on gurney c/o left chest pain 5/10 NAD speech clear sentences son in lap here for evaluation of ear pain also and female friend in room with patient also  1825 reviewed EKG NSR/normal VSS  1840 case discussed with Dr Terrilee Croak  Reviewed up to date for endocarditis as patient risk factor obesity, dental infection.  Patient to start clindamycin  and if chest pain resolved within 4 days rules out endocarditis per criteria.  If still having chest pain Monday to contact [facility] office to schedule re-evaluation.  If worsening or new symptoms over the weekend return to [facility] or go to ER for re-evaluation consider echocardiogram/labs. 1846 patient given copy EKG and notified of above instructions given discharge paperwork/school work excuse. Patient and friend verbalized understanding information/instructions, agreed with plan of care and had no further questions at this time.   MDM   1. Dental abscess   2. Chest pain at rest    Take antibiotic as ordered. Finish amoxicillin.  Start clindamycin 300mg  po TID Rx given.  Discussed with patient covers for endocarditis and dental abscess.  Follow up with PCM/Dentist Monday if no improvement ER this weekend if worsening chest pain, dyspnea, fever greater than 100.7F, palpitations.  Motrin 800mg  po TID prn and/or tylenol 1000mg  po QID prn dental pain  Listerine or equivalent mouth wash use after meals daily along with brushing teeth. Salt water gargles po prn.  Good dental hygiene brushing, flossing, mouthwash BID.  Follow up with PCM/dentist if no improvement or worsening of symptoms e.g. fever, chills, dyspnea, SOB, purulent discharge, neck pain, eye pain. exitcare handout on dental abscess given to patient.  Patient verbalized understanding of information/instructions agreed with plan of care and had no further questions at this time.  Normal sinus rhythm, normal axis and no acute ST changes.  Normal EKG based upon my interpretation.  Official read pending with cardiology service.  See media for completed copy/scanned image.  Due to recent dental infection cannot exclude endocarditis from patient differentials started on clindamycin today and currently on amoxicillin.  Patient with history of murmur and recurring intermittent chest pain last episode workup 3 years ago per patient.  School excuse for today and  tomorrow/48 hours.  ER this weekend if fever greater than 100.5, dyspnea, SOB, worsening chest pain, syncope, LOC.  Exitcare handout on endocarditis/chest pain given to patient.  Patient verbalized agreement and understanding of treatment plan.    Barbaraann Barthel, NP 05/28/16 2348

## 2016-05-31 ENCOUNTER — Ambulatory Visit (INDEPENDENT_AMBULATORY_CARE_PROVIDER_SITE_OTHER): Payer: 59 | Admitting: Family Medicine

## 2016-05-31 VITALS — BP 114/70 | Temp 98.6°F | Ht 65.0 in | Wt 256.0 lb

## 2016-05-31 DIAGNOSIS — E039 Hypothyroidism, unspecified: Secondary | ICD-10-CM | POA: Diagnosis not present

## 2016-05-31 DIAGNOSIS — K05219 Aggressive periodontitis, localized, unspecified severity: Secondary | ICD-10-CM

## 2016-05-31 DIAGNOSIS — R0789 Other chest pain: Secondary | ICD-10-CM | POA: Diagnosis not present

## 2016-05-31 DIAGNOSIS — R509 Fever, unspecified: Secondary | ICD-10-CM

## 2016-05-31 NOTE — Progress Notes (Signed)
   Subjective:    Patient ID: Linda RossettiLisabeth D [name], female    DOB: [birth year], 25 y.o.   MRN: [id number]  HPIFollow up on endocarditis and dental abscess.Patient with gingivae infection started on antibiotics or her dentist they recommended that she consider getting a tooth pulled along with being seen for further workup Urgent care was concerned about the possibility of endocarditis Patient has had murmurs in the past but she states she never knew why No echo on record Pt went to urgent care on the 14th. ER note was read  Pt states she still has hot flashes and chest tightness. Taking clindamycin, tylenol, and ibuprofen.  She states she will feel hot when she tipped checks her temperature is 90.8 Saw dentist today. Recommended to go to oral surgeon.   Palpitations. Pt states she never started thyroid med she was prescribed a few years ago.     Review of Systems Patient denies headache relates, pain relates low-grade fevers atypical chest pain sharp chest pains intermittent no breathing difficulties no chest pressure no shortness of breath. No vomiting or diarrhea or rashes.    Objective:   Physical Exam Lungs clear heart regular pulse normal no murmurs gingivitis noted neck no masses skin warm dry       Assessment & Plan:  1. Gingival abscess Continue antibiotics warm salt water gargles should gradually get better I do recommend follow-up with oral surgeon - TSH - T4, free - CBC with Differential/Platelet - Ambulatory referral to Cardiology  2. Febrile illness Illness should get better if high fevers worsening illness as we goes on immediately follow-up - TSH - T4, free - CBC with Differential/Platelet - Ambulatory referral to Cardiology  3. Other chest pain I doubt endocarditis. With her history of murmur in the past as well as some atypical chest pains and some mild-to-moderate fevers ER was concerned about the possibility of endocarditis I doubt that that is going on  currently we will refer to cardiology to see if they want to do an echo to rule out any vegetation patient was told if she starts having high fever body sweats are getting worse to follow-up sooner or notify us - TSH - T4, free - CBC with Differential/Platelet - Ambulatory referral to Cardiology  4. Hypothyroidism, unspecified hypothyroidism type Patient stopped taking her thyroid medicine check lab work await the results I believe she will need to restart on levothyroxine - TSH - T4, free - CBC with Differential/Platelet - Ambulatory referral to Cardiology

## 2016-06-01 ENCOUNTER — Encounter: Payer: Self-pay | Admitting: Family Medicine

## 2016-06-15 ENCOUNTER — Ambulatory Visit: Payer: 59 | Admitting: Cardiovascular Disease

## 2016-06-15 ENCOUNTER — Encounter: Payer: Self-pay | Admitting: Cardiovascular Disease

## 2016-08-25 ENCOUNTER — Ambulatory Visit
Admission: EM | Admit: 2016-08-25 | Discharge: 2016-08-25 | Disposition: A | Discharge status: Home / Self Care | Payer: 59 | Attending: Family Medicine | Admitting: Family Medicine

## 2016-08-25 DIAGNOSIS — H6502 Acute serous otitis media, left ear: Secondary | ICD-10-CM | POA: Diagnosis not present

## 2016-08-25 DIAGNOSIS — J069 Acute upper respiratory infection, unspecified: Secondary | ICD-10-CM | POA: Diagnosis not present

## 2016-08-25 LAB — RAPID INFLUENZA A&B ANTIGENS (ARMC ONLY): INFLUENZA A (ARMC): NEGATIVE

## 2016-08-25 LAB — PREGNANCY, URINE: Preg Test, Ur: NEGATIVE

## 2016-08-25 LAB — RAPID INFLUENZA A&B ANTIGENS: Influenza B (ARMC): NEGATIVE

## 2016-08-25 MED ORDER — AMOXICILLIN 875 MG PO TABS: 875.0000 mg | ORAL_TABLET | Freq: Two times a day (BID) | ORAL | 0 refills | Status: DC

## 2016-08-25 MED ORDER — ALBUTEROL SULFATE HFA 108 (90 BASE) MCG/ACT IN AERS: 2.0000 | INHALATION_SPRAY | RESPIRATORY_TRACT | 0 refills | Status: DC | PRN

## 2016-08-25 NOTE — ED Triage Notes (Signed)
Patient c/o fever, cough, wheezing and green mucous from nose.

## 2016-08-25 NOTE — Discharge Instructions (Signed)
Take medication as prescribed. Rest. Drink plenty of fluids.  ° °Follow up with your primary care physician this week as needed. Return to Urgent care for new or worsening concerns.  ° °

## 2016-08-25 NOTE — ED Provider Notes (Signed)
Procedures    Clinical Course  Time seen: Approximately 2:32 PM  I have reviewed the triage vital signs and the nursing notes.   HISTORY  Chief Complaint Fever   HPI Linda [name] is a 25 y.o. female presents for complaints of 2 days of cough, chest congestion, thick drainage and sloughing nose as well as intermittent wheezing. Patient reports that she has also had some runny nose and nasal congestion prior to the last 2 days but states she thought that it was her allergies. Patient also reports left ear pain for the last 2 days. States cough is mostly at night with some intermittent wheezing. Patient reports history of asthma and states that the wheezing consistent with her asthma. Reports her son at home also with similar symptoms. Reports 100 degree oral fever yesterday.  Reports continues to eat and drink well. Reports over-the-counter medications have not resolved symptoms.  Denies chest pain, shortness of breath, abdominal pain, dysuria, recent sickness, recent antibiotic use, extremity pain, extremity swelling. Reports has continued to remain active.  Lubertha South, MD PCP Patient's last menstrual period was 07/21/2016. Patient states last period was about 4 weeks ago. Patient states she does not think she is pregnant.        Past Medical History:  Diagnosis Date  . Anxiety   . Asthma   . Heart murmur   . IBS (irritable bowel syndrome)   . Interstitial cystitis         Patient Active Problem List   Diagnosis Date Noted  . Hypothyroidism 06/21/2014  . Interstitial cystitis 06/14/2014  . 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 Morbid obesity (HCC) 02/07/2014  . 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 Asthma, chronic 04/15/2013  . Chronic anxiety 04/15/2013  . Cervical strain 03/07/2012  . Sprain and strain of unspecified site of shoulder and upper arm 03/07/2012  . Pain in joint, shoulder region 03/07/2012          Past Surgical History:  Procedure Laterality Date  . CESAREAN SECTION N/A 03/20/2013   Procedure: CESAREAN SECTION;  Surgeon: [name], MD;  Location: [facility];  Service: Obstetrics;  Laterality: N/A;  . CESAREAN SECTION    . MYRINGOTOMY WITH TUBE PLACEMENT    . NO PAST SURGERIES    . TYMPANOSTOMY TUBE PLACEMENT    . UPPER GI ENDOSCOPY         Current Outpatient Rx  . Order #: [id number] Class: Normal  . Order #: [id number] Class: Normal  . Order #: [id number] Class: Normal  . Order #: [id number] Class: Normal    No current facility-administered medications for this encounter.   Current Outpatient Prescriptions:  .  citalopram (CELEXA) 40 MG tablet, TAKE 1 TABLET BY MOUTH EVERY DAY, NEEDS OFFICE VISIT FOR FURTHER REFILLS, Disp: 90 tablet, Rfl: 0 .  albuterol (PROVENTIL HFA;VENTOLIN HFA) 108 (90 Base) MCG/ACT inhaler, Inhale 2 puffs into the lungs every 6 (six) hours as needed for wheezing., Disp: 1 Inhaler, Rfl: 2 .  albuterol (PROVENTIL HFA;VENTOLIN HFA) 108 (90 Base) MCG/ACT inhaler, Inhale 2 puffs into the lungs every 4 (four) hours as needed., Disp: 1 Inhaler, Rfl: 0 .  amoxicillin (AMOXIL) 875 MG tablet, Take 1 tablet (875 mg total) by mouth 2 (two) times daily., Disp: 20 tablet, Rfl: 0  Allergies Buspar [buspirone]; Sulfa antibiotics; Sulfa antibiotics; and Buspar [buspirone]       Family History  Problem Relation Age of Onset  . Cancer Paternal Uncle   . COPD Maternal  Grandfather   . Stroke Paternal Grandfather     Social History      Social History  Substance Use Topics  . Smoking status: Current Every Day Smoker    Packs/day: 0.50    Types: Cigarettes    Start date: 01/28/2013  . Smokeless tobacco: Never Used  . Alcohol use No   Review of Systems Constitutional: As above.  Eyes: No visual changes. ENT: No sore throat. Cardiovascular: Denies chest pain. Respiratory: Denies shortness of breath. Gastrointestinal: No  abdominal pain.  No nausea, no vomiting.  No diarrhea.  No constipation. Genitourinary: Negative for dysuria. Musculoskeletal: Negative for back pain. Skin: Negative for rash. Neurological: Negative for headaches, focal weakness or numbness.  10-point ROS otherwise negative.  ____________________________________________   PHYSICAL EXAM:  VITAL SIGNS:     ED Triage Vitals  Enc Vitals Group     BP 08/25/16 1415 113/79     Pulse Rate 08/25/16 1415 98     Resp 08/25/16 1415 18     Temp 08/25/16 1415 99 F (37.2 C)     Temp Source 08/25/16 1415 Tympanic     SpO2 08/25/16 1415 98 %     Weight 08/25/16 1415 200 lb (90.7 kg)     Height 08/25/16 1415 5\' 5"  (1.651 m)     Head Circumference --      Peak Flow --      Pain Score 08/25/16 1416 6     Pain Loc --      Pain Edu? --      Excl. in GC? --    Constitutional: Alert and oriented. Well appearing and in no acute distress. Eyes: Conjunctivae are normal. PERRL. EOMI. Head: Atraumatic. No sinus tenderness to palpation. No swelling. No erythema.             Ears: Right: Nontender, no erythema, normal TM. Left: Mild tenderness to auricle movement, clear canal, moderate erythema with serous effusion.             Nose:Nasal congestion with clear rhinorrhea             Mouth/Throat: Mucous membranes are moist. No pharyngeal erythema. No tonsillar swelling or exudate.  Neck: No stridor.  No cervical spine tenderness to palpation. Hematological/Lymphatic/Immunilogical: No cervical lymphadenopathy. Cardiovascular: Normal rate, regular rhythm. Grossly normal heart sounds.  Good peripheral circulation. Respiratory: Normal respiratory effort.  No retractions. Lungs CTAB.No wheezes, rales or rhonchi. Good air movement. Dry intermittent cough noted in room.  Gastrointestinal: Soft and nontender. No CVA tenderness. Musculoskeletal: No lower or upper extremity tenderness nor edema. No cervical, thoracic or lumbar tenderness to  palpation. Neurologic:  Normal speech and language. No gross focal neurologic deficits are appreciated. No gait instability. Skin:  Skin is warm, dry and intact. No rash noted. Psychiatric: Mood and affect are normal. Speech and behavior are normal.    ___________________________________________   LABS (all labs ordered are listed, but only abnormal results are displayed)  Labs Reviewed  RAPID INFLUENZA A&B ANTIGENS ([facility] ONLY)  PREGNANCY, URINE    RADIOLOGY  No results found. ____________________________________________   PROCEDURES Procedures    INITIAL IMPRESSION / ASSESSMENT AND PLAN / ED COURSE  Pertinent labs & imaging results that were available during my care of the patient were reviewed by me and considered in my medical decision making (see chart for details).  Well-appearing patient. No acute distress. Cough and congestion symptoms. Patient with left serous otitis. Flu swab negative. Urine pregnancy test negative. Suspect  viral upper respiratory infection. Encourage rest, fluids and supportive care. Will treat with oral amoxicillin. Discussed indication, risks and benefits of medications with patient.  Discussed follow up with Primary care physician this week. Discussed follow up and return parameters including no resolution or any worsening concerns. Patient verbalized understanding and agreed to plan.   ____________________________________________   FINAL CLINICAL IMPRESSION(S) / ED DIAGNOSES  Final diagnoses:  Acute serous otitis media of left ear, recurrence not specified  Upper respiratory tract infection, unspecified type        Discharge Medication List as of 08/25/2016  3:25 PM       START taking these medications   Details  amoxicillin (AMOXIL) 875 MG tablet Take 1 tablet (875 mg total) by mouth 2 (two) times daily., Starting Wed 08/25/2016, Normal        Note: This dictation was prepared with Dragon dictation along  with smaller phrase technology. Any transcriptional errors that result from this process are unintentional.    Clinical Course       Renford DillsLindsey Tyberius Ryner, NP 08/25/16 1933

## 2016-09-27 ENCOUNTER — Encounter: Payer: 59 | Admitting: Nurse Practitioner

## 2016-09-27 DIAGNOSIS — Z029 Encounter for administrative examinations, unspecified: Secondary | ICD-10-CM

## 2016-10-11 ENCOUNTER — Encounter: Payer: Self-pay | Admitting: Emergency Medicine

## 2016-10-11 ENCOUNTER — Emergency Department
Admission: EM | Admit: 2016-10-11 | Discharge: 2016-10-11 | Disposition: A | Discharge status: Home / Self Care | Payer: 59 | Attending: Emergency Medicine | Admitting: Emergency Medicine

## 2016-10-11 DIAGNOSIS — E039 Hypothyroidism, unspecified: Secondary | ICD-10-CM | POA: Diagnosis not present

## 2016-10-11 DIAGNOSIS — J029 Acute pharyngitis, unspecified: Secondary | ICD-10-CM | POA: Insufficient documentation

## 2016-10-11 DIAGNOSIS — Z79899 Other long term (current) drug therapy: Secondary | ICD-10-CM | POA: Diagnosis not present

## 2016-10-11 DIAGNOSIS — F17210 Nicotine dependence, cigarettes, uncomplicated: Secondary | ICD-10-CM | POA: Insufficient documentation

## 2016-10-11 DIAGNOSIS — J45909 Unspecified asthma, uncomplicated: Secondary | ICD-10-CM | POA: Insufficient documentation

## 2016-10-11 LAB — POCT RAPID STREP A: STREPTOCOCCUS, GROUP A SCREEN (DIRECT): NEGATIVE

## 2016-10-11 MED ORDER — NAPROXEN 500 MG PO TABS: 500.0000 mg | ORAL_TABLET | Freq: Two times a day (BID) | ORAL | 0 refills | Status: DC

## 2016-10-11 MED ORDER — NAPROXEN 500 MG PO TABS
500.0000 mg | ORAL_TABLET | Freq: Once | ORAL | Status: AC
  Administered 2016-10-11: 500 mg via ORAL
  Filled 2016-10-11: qty 1

## 2016-10-11 NOTE — ED Notes (Signed)

## 2016-10-11 NOTE — ED Triage Notes (Signed)
Pt reports sore throat for the past couple days, states that she has noticed white patches to her tonsils today, pt also reports some fever, pt states that she took a tylenol #3 30 min pta, tonsils appear enlarged

## 2016-10-11 NOTE — ED Provider Notes (Signed)
[facility] Emergency Department Provider Note  ____________________________________________  Time seen: Approximately 5:15 AM  I have reviewed the triage vital signs and the nursing notes.   HISTORY  Chief Complaint Sore Throat   HPI Linda [name] is a 25 y.o. female with history as listed below who presents for evaluation of sore throat. Patient reports2 days of sore throat, cough, congestion, and chills. Vaccines are up-to-date including flu. Patient reports her MAXIMUM TEMPERATURE at home was 100F. no chest pain or shortness of breath. No rash. No headache or meningismus. Patient has tried Tylenol and Motrin at home with no relief of her pain. She is currently complaining of severe sore throat, worse when she swallows, constant.  Past Medical History:  Diagnosis Date  . Anxiety   . Asthma   . Heart murmur   . IBS (irritable bowel syndrome)   . Interstitial cystitis     Patient Active Problem List   Diagnosis Date Noted  . Hypothyroidism 06/21/2014  . Interstitial cystitis 06/14/2014  . 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 Morbid obesity (HCC) 02/07/2014  . 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 Asthma, chronic 04/15/2013  . Chronic anxiety 04/15/2013  . Cervical strain 03/07/2012  . Sprain and strain of unspecified site of shoulder and upper arm 03/07/2012  . Pain in joint, shoulder region 03/07/2012    Past Surgical History:  Procedure Laterality Date  . CESAREAN SECTION N/A 03/20/2013   Procedure: CESAREAN SECTION;  Surgeon: Oliver PilaKathy W Richardson, MD;  Location: [facility];  Service: Obstetrics;  Laterality: N/A;  . CESAREAN SECTION    . MYRINGOTOMY WITH TUBE PLACEMENT    . NO PAST SURGERIES    . TYMPANOSTOMY TUBE PLACEMENT    . UPPER GI ENDOSCOPY      Prior to Admission medications   Medication Sig Start Date End Date Taking? Authorizing Provider  albuterol (PROVENTIL HFA;VENTOLIN HFA) 108 (90 Base)  MCG/ACT inhaler Inhale 2 puffs into the lungs every 6 (six) hours as needed for wheezing. 11/25/15   Babs SciaraScott A Luking, MD  albuterol (PROVENTIL HFA;VENTOLIN HFA) 108 (90 Base) MCG/ACT inhaler Inhale 2 puffs into the lungs every 4 (four) hours as needed. 08/25/16   Renford DillsLindsey Miller, NP  amoxicillin (AMOXIL) 875 MG tablet Take 1 tablet (875 mg total) by mouth 2 (two) times daily. 08/25/16   Renford DillsLindsey Miller, NP  citalopram (CELEXA) 40 MG tablet TAKE 1 TABLET BY MOUTH EVERY DAY, NEEDS OFFICE VISIT FOR FURTHER REFILLS 02/09/16   Merlyn AlbertWilliam S Luking, MD  naproxen (NAPROSYN) 500 MG tablet Take 1 tablet (500 mg total) by mouth 2 (two) times daily with a meal. 10/11/16 10/11/17  Nita Sicklearolina Savvas Roper, MD    Allergies Buspar [buspirone]; Sulfa antibiotics; Sulfa antibiotics; and Buspar [buspirone]  Family History  Problem Relation Age of Onset  . Cancer Paternal Uncle   . COPD Maternal Grandfather   . Stroke Paternal Grandfather     Social History Social History  Substance Use Topics  . Smoking status: Current Every Day Smoker    Packs/day: 0.50    Types: Cigarettes    Start date: 01/28/2013  . Smokeless tobacco: Never Used  . Alcohol use No    Review of Systems  Constitutional: Negative for fever. + chills Eyes: Negative for visual changes. ENT: + sore throat. Neck: No neck pain  Cardiovascular: Negative for chest pain. Respiratory: Negative for shortness of breath. + cough and congestion Gastrointestinal: Negative for abdominal pain, vomiting or diarrhea. Genitourinary: Negative  for dysuria. Musculoskeletal: Negative for back pain. Skin: Negative for rash. Neurological: Negative for headaches, weakness or numbness. Psych: No SI or HI  ____________________________________________   PHYSICAL EXAM:  VITAL SIGNS: ED Triage Vitals [10/11/16 0212]  Enc Vitals Group     BP 119/69     Pulse Rate 83     Resp 18     Temp 98 F (36.7 C)     Temp Source Oral     SpO2 97 %     Weight 200 lb  (90.7 kg)     Height 5\' 5"  (1.651 m)     Head Circumference      Peak Flow      Pain Score 7     Pain Loc      Pain Edu?      Excl. in GC?     Constitutional: Alert and oriented. Patient sleeping comfortably in the room. Well appearing and in no apparent distress. HEENT:      Head: Normocephalic and atraumatic.         Eyes: Conjunctivae are normal. Sclera is non-icteric. EOMI. PERRL      Mouth/Throat: Mucous membranes are moist. Oropharynx shows swollen erythematous tonsils with mild exudates, no evidence of peritonsillar abscess      Neck: Supple with no signs of meningismus. Cardiovascular: Regular rate and rhythm. No murmurs, gallops, or rubs. 2+ symmetrical distal pulses are present in all extremities. No JVD. Respiratory: Normal respiratory effort. Lungs are clear to auscultation bilaterally. No wheezes, crackles, or rhonchi.  Gastrointestinal: Soft, non tender, and non distended with positive bowel sounds. No rebound or guarding. Musculoskeletal: Nontender with normal range of motion in all extremities. No edema, cyanosis, or erythema of extremities. Neurologic: Normal speech and language. Face is symmetric. Moving all extremities. No gross focal neurologic deficits are appreciated. Skin: Skin is warm, dry and intact. No rash noted. Psychiatric: Mood and affect are normal. Speech and behavior are normal.  ____________________________________________   LABS (all labs ordered are listed, but only abnormal results are displayed)  Labs Reviewed  POCT RAPID STREP A   ____________________________________________  EKG  none ____________________________________________  RADIOLOGY  none  ____________________________________________   PROCEDURES  Procedure(s) performed: None Procedures Critical Care performed:  None ____________________________________________   INITIAL IMPRESSION / ASSESSMENT AND PLAN / ED COURSE   25 y.o. female who presents for evaluation of  sore throat, cough, congestion and chills for a few days. Patient is afebrile, normal vital signs, she is sleeping comfortably in the room in no distress, she has mild swelling, erythema, neck or dates of her oropharynx bilaterally. Remainder of her physical exam is within normal limits. Rapid strep is negative. Culture has been sent. We'll give naproxen and discharged in supportive care and follow-up with PCP.  Clinical Course     Pertinent labs & imaging results that were available during my care of the patient were reviewed by me and considered in my medical decision making (see chart for details).    ____________________________________________   FINAL CLINICAL IMPRESSION(S) / ED DIAGNOSES  Final diagnoses:  Pharyngitis, unspecified etiology      NEW MEDICATIONS STARTED DURING THIS VISIT:  New Prescriptions   NAPROXEN (NAPROSYN) 500 MG TABLET    Take 1 tablet (500 mg total) by mouth 2 (two) times daily with a meal.     Note:  This document was prepared using Dragon voice recognition software and may include unintentional dictation errors.    Nita Sicklearolina Onia Shiflett, MD 10/11/16 [phone]

## 2016-10-12 ENCOUNTER — Ambulatory Visit: Payer: 59 | Admitting: Family Medicine

## 2016-10-12 ENCOUNTER — Ambulatory Visit (INDEPENDENT_AMBULATORY_CARE_PROVIDER_SITE_OTHER): Payer: 59 | Admitting: Family Medicine

## 2016-10-12 ENCOUNTER — Encounter: Payer: Self-pay | Admitting: Family Medicine

## 2016-10-12 VITALS — BP 118/68 | Temp 97.9°F | Ht 66.0 in | Wt 256.0 lb

## 2016-10-12 DIAGNOSIS — J329 Chronic sinusitis, unspecified: Secondary | ICD-10-CM | POA: Diagnosis not present

## 2016-10-12 MED ORDER — CEFDINIR 300 MG PO CAPS: 300.0000 mg | ORAL_CAPSULE | Freq: Two times a day (BID) | ORAL | 0 refills | Status: DC

## 2016-10-12 NOTE — Progress Notes (Signed)
   Subjective:    Patient ID: Linda PersonLisabeth D [name], female    DOB: 06-02-1991, 25 y.o.   MRN: [id number]  Sore Throat   This is a new problem. Episode onset: 3 days ago. Associated symptoms include congestion, coughing and headaches. Associated symptoms comments: fever. Treatments tried: ibuprofen, tylenol.   Had strep done at ED yesterday. Negative.  Started three d agothroat painful cough prodcutive  White patches on the tonsils  Pos fever  t max 100 with chills    (In the force testing is Frontal   Bad ain this morn   No vom no diarrhea Review of Systems  HENT: Positive for congestion.   Respiratory: Positive for cough.   Neurological: Positive for headaches.       Objective:   Physical Exam  Alert, mild malaise. Hydration good Vitals stable. frontal/ maxillary tenderness evident positive nasal congestion. pharynx normal neck supple  lungs clear/no crackles or wheezes. heart regular in rhythm       Assessment & Plan:  Impression rhinosinusitis likely post viral, discussed with patient. plan antibiotics prescribed. Questions answered. Symptomatic care discussed. warning signs discussed. WSL

## 2016-10-15 ENCOUNTER — Encounter: Payer: Self-pay | Admitting: Family Medicine

## 2016-10-25 ENCOUNTER — Telehealth: Payer: Self-pay | Admitting: Family Medicine

## 2016-10-25 ENCOUNTER — Other Ambulatory Visit: Payer: Self-pay | Admitting: *Deleted

## 2016-10-25 MED ORDER — FLUCONAZOLE 150 MG PO TABS: ORAL_TABLET | ORAL | 0 refills | Status: DC

## 2016-10-25 NOTE — Progress Notes (Unsigned)
difluc

## 2016-10-25 NOTE — Telephone Encounter (Signed)
Pt is requesting something to be called in for a yeast inf.    [facility] [place]

## 2016-10-25 NOTE — Telephone Encounter (Signed)
Tried to call no answer. Diflucan sent to pharm.

## 2016-10-27 NOTE — Telephone Encounter (Signed)
Pt notified on voicemail  °

## 2016-11-17 ENCOUNTER — Encounter: Payer: Self-pay | Admitting: Family Medicine

## 2016-11-17 ENCOUNTER — Ambulatory Visit (INDEPENDENT_AMBULATORY_CARE_PROVIDER_SITE_OTHER): Payer: 59 | Admitting: Family Medicine

## 2016-11-17 VITALS — BP 110/78 | Temp 98.0°F | Ht 66.0 in | Wt 260.1 lb

## 2016-11-17 DIAGNOSIS — Z113 Encounter for screening for infections with a predominantly sexual mode of transmission: Secondary | ICD-10-CM

## 2016-11-17 DIAGNOSIS — R300 Dysuria: Secondary | ICD-10-CM

## 2016-11-17 LAB — POCT URINALYSIS DIPSTICK
PH UA: 5
SPEC GRAV UA: 1.02

## 2016-11-17 LAB — POCT URINE PREGNANCY: Preg Test, Ur: NEGATIVE

## 2016-11-17 MED ORDER — DOXYCYCLINE HYCLATE 100 MG PO CAPS: 100.0000 mg | ORAL_CAPSULE | Freq: Two times a day (BID) | ORAL | 0 refills | Status: DC

## 2016-11-17 NOTE — Progress Notes (Signed)
   Subjective:    Patient ID: Linda [name], female    DOB: 1991/03/25, 26 y.o.   MRN: [id number]  Dysuria   This is a new problem. The problem occurs intermittently. The problem has been unchanged. The quality of the pain is described as burning. The pain is moderate. There has been no fever. Treatments tried: AZO. The treatment provided no relief.   Patient wants STD testing done also.  Results for orders placed or performed in visit on 11/17/16  POCT urinalysis dipstick  Result Value Ref Range   Color, UA     Clarity, UA     Glucose, UA     Bilirubin, UA     Ketones, UA     Spec Grav, UA 1.020    Blood, UA     pH, UA 5.0    Protein, UA     Urobilinogen, UA     Nitrite, UA     Leukocytes, UA small (1+) (A) Negative  POCT urine pregnancy  Result Value Ref Range   Preg Test, Ur Negative Negative    Patient had relations condom broke concerned about STD would like to have STD check in the urine. She denies being pregnant patient was told her pregnancy symptoms over the next few weeks recheck urine pregnancy  Review of Systems  Genitourinary: Positive for dysuria.  Patient denies high fever hematuria vomiting diarrhea      Objective:   Physical Exam  Lungs are clear hearts regular flank nontender      Assessment & Plan:  Urinalysis no excessive WBCs seen Urine culture sent GC chlamydia Antibiotic prescribed Warning signs discussed Follow-up if progressive troubles

## 2016-11-19 LAB — CHLAMYDIA/GONOCOCCUS/TRICHOMONAS, NAA
Chlamydia by NAA: NEGATIVE
GONOCOCCUS BY NAA: NEGATIVE
Trich vag by NAA: NEGATIVE

## 2016-11-19 LAB — PLEASE NOTE

## 2016-11-19 LAB — URINE CULTURE

## 2016-11-24 ENCOUNTER — Ambulatory Visit (INDEPENDENT_AMBULATORY_CARE_PROVIDER_SITE_OTHER): Payer: 59 | Admitting: Nurse Practitioner

## 2016-11-24 ENCOUNTER — Encounter: Payer: Self-pay | Admitting: Nurse Practitioner

## 2016-11-24 VITALS — BP 112/82 | Ht 66.0 in | Wt 254.0 lb

## 2016-11-24 DIAGNOSIS — E039 Hypothyroidism, unspecified: Secondary | ICD-10-CM | POA: Diagnosis not present

## 2016-11-24 DIAGNOSIS — E162 Hypoglycemia, unspecified: Secondary | ICD-10-CM

## 2016-11-24 DIAGNOSIS — Z23 Encounter for immunization: Secondary | ICD-10-CM | POA: Diagnosis not present

## 2016-11-24 DIAGNOSIS — F988 Other specified behavioral and emotional disorders with onset usually occurring in childhood and adolescence: Secondary | ICD-10-CM | POA: Diagnosis not present

## 2016-11-24 LAB — POCT GLYCOSYLATED HEMOGLOBIN (HGB A1C): Hemoglobin A1C: 5

## 2016-11-24 MED ORDER — AMPHETAMINE-DEXTROAMPHET ER 20 MG PO CP24: 20.0000 mg | ORAL_CAPSULE | Freq: Every day | ORAL | 0 refills | Status: DC

## 2016-11-24 NOTE — Progress Notes (Signed)
Subjective:  Results to discuss her thyroid. Had some problems with thyroiditis after the birth of her child about 3 years ago. Has not had it checked recently. Has been experiencing difficulty losing weight and fatigue. Also would like to restart her Adderall for ADD. Is now working as a Social workerhair stylist, having difficulty focusing. Difficulty concentrating. Easily distracted. Has been treated for ADD for several years but stopped her medication for while. Has also experienced symptoms of mild hypoglycemia, questions whether she has an elevated sugar.  Objective:   BP 112/82   Ht 5\' 6"  (1.676 m)   Wt 254 lb (115.2 kg)   BMI 41.00 kg/m  NAD. Alert, oriented. Thoughts logical coherent and relevant. Dressed properly. Cheerful affect. Thyroid normal limit to palpation, no tenderness mass or goiter noted. Lungs clear. Heart regular rate rhythm. Significant central obesity noted. Results for orders placed or performed in visit on 11/24/16  POCT glycosylated hemoglobin (Hb A1C)  Result Value Ref Range   Hemoglobin A1C 5.0     Assessment:   Problem List Items Addressed This Visit      Endocrine   Hypothyroidism   Relevant Orders   TSH   T4, free     Other   ADD (attention deficit disorder) without hyperactivity - Primary    Other Visit Diagnoses    Hypoglycemia       Relevant Orders   POCT glycosylated hemoglobin (Hb A1C) (Completed)   Need for vaccination       Relevant Orders   Flu Vaccine QUAD 36+ mos IM (Fluarix & Fluzone Quad PF     Plan:  Meds ordered this encounter  Medications  . amphetamine-dextroamphetamine (ADDERALL XR) 20 MG 24 hr capsule    Sig: Take 1 capsule (20 mg total) by mouth daily.    Dispense:  30 capsule    Refill:  0    Order Specific Question:   Supervising Provider    Answer:   Merlyn AlbertLUKING, WILLIAM S [[id number]]   Trial of Adderall XR 20 mg daily. Before prescription is complete patient to let us know about dose and whether or not to continue long-acting or switch  to short acting medication. Strongly encouraged regular activity and healthy diet low in sugar and simple carbs. Even though her A1c is normal patient advised she could be having elevated insulin levels at times. Lab work for thyroid pending to see if she needs to restart medication. Return in about 4 months (around 03/24/2017).  Signed, Presley Raddlearolyn C. Santa CruzHoskins, FNP, [facility] 11/24/2016 5:13 PM

## 2016-11-25 LAB — T4, FREE: Free T4: 0.78 ng/dL — ABNORMAL LOW (ref 0.82–1.77)

## 2016-11-25 LAB — TSH: TSH: 4.37 u[IU]/mL (ref 0.450–4.500)

## 2016-12-07 ENCOUNTER — Encounter: Payer: Self-pay | Admitting: Family Medicine

## 2016-12-29 ENCOUNTER — Ambulatory Visit (INDEPENDENT_AMBULATORY_CARE_PROVIDER_SITE_OTHER): Payer: 59 | Admitting: Nurse Practitioner

## 2016-12-29 ENCOUNTER — Encounter: Payer: Self-pay | Admitting: Nurse Practitioner

## 2016-12-29 VITALS — BP 118/78 | Temp 98.2°F | Ht 66.0 in | Wt 258.0 lb

## 2016-12-29 DIAGNOSIS — J209 Acute bronchitis, unspecified: Secondary | ICD-10-CM | POA: Diagnosis not present

## 2016-12-29 DIAGNOSIS — B9689 Other specified bacterial agents as the cause of diseases classified elsewhere: Secondary | ICD-10-CM

## 2016-12-29 DIAGNOSIS — J069 Acute upper respiratory infection, unspecified: Secondary | ICD-10-CM | POA: Diagnosis not present

## 2016-12-29 DIAGNOSIS — R05 Cough: Secondary | ICD-10-CM | POA: Diagnosis not present

## 2016-12-29 DIAGNOSIS — J45901 Unspecified asthma with (acute) exacerbation: Secondary | ICD-10-CM | POA: Diagnosis not present

## 2016-12-29 MED ORDER — PREDNISONE 20 MG PO TABS: ORAL_TABLET | ORAL | 0 refills | Status: DC

## 2016-12-29 MED ORDER — DOXYCYCLINE HYCLATE 100 MG PO TABS: 100.0000 mg | ORAL_TABLET | Freq: Two times a day (BID) | ORAL | 0 refills | Status: DC

## 2016-12-29 MED ORDER — ALBUTEROL SULFATE (2.5 MG/3ML) 0.083% IN NEBU
2.5000 mg | INHALATION_SOLUTION | Freq: Once | RESPIRATORY_TRACT | Status: AC
  Administered 2016-12-29: 2.5 mg via RESPIRATORY_TRACT

## 2016-12-29 NOTE — Progress Notes (Signed)
Subjective:  26 yo female seen today for symptoms congestion and cough.  Symptoms began 3 days ago.  Symptoms include headache, runny nose, bilateral ear pain, fever/chills, coughing with green sputum, and wheezing.  Denies sinus pressure, facial pain, sore throat, abd.pain, nausea, vomiting, or diarrhea.   Positive for generalized pain in chest with deep breaths and coughing.  Using Mucinex, tylenol, and Albuterol Q4hrs with mild improvement in symptoms.  No acute SOB causing her concern.  Has taken multiple doses of a variety of antibiotics in the past, with most effective outcome with Doxycycline. Pt. Is a smoker with recent increased frequency.  Denies possibility of pregnancy.   Objective:   BP 118/78   Temp 98.2 F (36.8 C) (Oral)   Ht 5\' 6"  (1.676 m)   Wt 258 lb (117 kg)   BMI 41.64 kg/m  Alert and oriented, NAD, appears fatigued.  Tm's: R.ear retracted, bilateral mild clear effusion, no erythema and old scarring Pharynx: +PND, non-injected Neck: Supple, mild ant. Cervical adenopathy Chest: Pre-breathing trmt albuterol 2.5 mg: bilateral inspiratory and expiratory wheezes and rhonchi Post Breathing tmt: improved air flow; faint inspiratory wheezes with slight sonorous expiratory wheezes. No tachypnea. Normal color.  Cardiac: RRR, no murmurs   Assessment:   Acute bronchitis with asthma with acute exacerbation - Plan: albuterol (PROVENTIL) (2.5 MG/3ML) 0.083% nebulizer solution 2.5 mg  Bacterial upper respiratory infection    Plan:   Meds ordered this encounter  Medications  . albuterol (PROVENTIL) (2.5 MG/3ML) 0.083% nebulizer solution 2.5 mg  . doxycycline (VIBRA-TABS) 100 MG tablet    Sig: Take 1 tablet (100 mg total) by mouth 2 (two) times daily.    Dispense:  20 tablet    Refill:  0    Order Specific Question:   Supervising Provider    Answer:   Merlyn AlbertLUKING, WILLIAM S [[id number]]  . predniSONE (DELTASONE) 20 MG tablet    Sig: 3 po qd x 3 d then 2 po qd x 3 d then 1 po qd x 2 d   Dispense:  17 tablet    Refill:  0    Order Specific Question:   Supervising Provider    Answer:   Merlyn AlbertLUKING, WILLIAM S [[id number]]   Symptom care and warning signs reviewed Discussed importance of monitoring breathing.  If increased SOB, wheezing, or chest pain increases and no relief from inhalers, go to the E.D.    Smoking cessation information provided.   Use backup method while on antibiotic.  Discussed in detail the risk associated with pregnancy and prescribed antibiotics.  Return if symptoms worsen or fail to improve.

## 2016-12-30 ENCOUNTER — Telehealth: Payer: Self-pay | Admitting: Nurse Practitioner

## 2016-12-30 DIAGNOSIS — N912 Amenorrhea, unspecified: Secondary | ICD-10-CM

## 2016-12-30 NOTE — Telephone Encounter (Signed)
Patient advised that Eber JonesCarolyn recommends a serum HCG(qualitative) today at lab. Patient verbalized understanding and stated she would have test drawn today. [facility]

## 2016-12-30 NOTE — Telephone Encounter (Signed)
I have thought about our discussion yesterday. Even though the patient took the morning after pill a few weeks ago, I recommend a serum HCG (qualitative) today. Please call this am. Thanks! Eber Jonesarolyn

## 2017-02-05 ENCOUNTER — Other Ambulatory Visit: Payer: Self-pay | Admitting: Family Medicine

## 2017-03-21 ENCOUNTER — Ambulatory Visit: Payer: 59 | Admitting: Nurse Practitioner

## 2017-03-28 ENCOUNTER — Encounter: Payer: Self-pay | Admitting: Nurse Practitioner

## 2017-03-28 ENCOUNTER — Ambulatory Visit (INDEPENDENT_AMBULATORY_CARE_PROVIDER_SITE_OTHER): Payer: 59 | Admitting: Nurse Practitioner

## 2017-03-28 VITALS — BP 120/70 | Ht 66.0 in | Wt 243.1 lb

## 2017-03-28 DIAGNOSIS — F418 Other specified anxiety disorders: Secondary | ICD-10-CM | POA: Diagnosis not present

## 2017-03-28 MED ORDER — CLONAZEPAM 0.5 MG PO TABS: 0.5000 mg | ORAL_TABLET | Freq: Two times a day (BID) | ORAL | 0 refills | Status: DC | PRN

## 2017-03-28 NOTE — Progress Notes (Signed)
Subjective:  Patient presents for complaints of a flareup of her anxiety and depression symptoms over the past 2 weeks. Cannot identify any specific stressor causing a flareup of her symptoms. Continues to take Celexa 40 mg daily. Having mild panic attacks at times. States she does not want to get out of bed may days of the week and when she has to go out she feels some panic. Denies suicidal or homicidal thoughts or ideation. Patient has a complex mental health history. States she was told she has bipolar depression at one point. The second provider told her she had anxiety.  Objective:   BP 120/70   Ht 5\' 6"  (1.676 m)   Wt 243 lb 2 oz (110.3 kg)   BMI 39.24 kg/m  NAD. Alert, oriented. Mildly flat affect. Lungs clear. Heart regular rate rhythm. Thoughts logical coherent and relevant. Dressed appropriately. Making good eye contact.  Assessment:   Problem List Items Addressed This Visit      Other   Depression with anxiety - Primary   Relevant Orders   Ambulatory referral to Psychiatry       Plan:   Meds ordered this encounter  Medications  . clonazePAM (KLONOPIN) 0.5 MG tablet    Sig: Take 1 tablet (0.5 mg total) by mouth 2 (two) times daily as needed for anxiety.    Dispense:  30 tablet    Refill:  0    Order Specific Question:   Supervising Provider    Answer:   Riccardo DubinLUKING, WILLIAM S [[id number]]   Refer to local psychiatrist for evaluation and treatment. In the meantime continue Celexa 40 mg daily. Given short-term prescription for clonazepam in case of extreme anxiety. Drowsiness precautions. A shunt to call office or seek help immediately in the meantime if symptoms worsen.

## 2017-03-31 ENCOUNTER — Encounter: Payer: Self-pay | Admitting: Family Medicine

## 2017-04-04 ENCOUNTER — Telehealth: Payer: Self-pay | Admitting: Nurse Practitioner

## 2017-04-04 ENCOUNTER — Other Ambulatory Visit: Payer: Self-pay

## 2017-04-04 MED ORDER — ADDERALL XR 30 MG PO CP24: 30.0000 mg | ORAL_CAPSULE | Freq: Every day | ORAL | 0 refills | Status: DC

## 2017-04-04 NOTE — Telephone Encounter (Signed)
Pt is requesting a refill on ADDERALL XR 30 MG 24 hr capsule  Pt's son will be seen here today.

## 2017-04-04 NOTE — Telephone Encounter (Signed)
Has been referred to Dr. Vanetta ShawlHisada but that may take some time, then psych can decide which meds to continue.

## 2017-04-04 NOTE — Telephone Encounter (Signed)
Linda [name] to see, normlly I would rec pt referred to psychiatrist should have psych decide the approtpriatemess of that med and whether to give with other psychotropic meds

## 2017-04-04 NOTE — Telephone Encounter (Signed)
Last seen 03/28/17

## 2017-05-11 NOTE — Progress Notes (Deleted)
Psychiatric Initial Adult Assessment   Patient Identification: Linda [name] MRN:  [id number] Date of Evaluation:  05/11/2017 Referral Source: Sherie DonHoskins, Carolyn  Chief Complaint:   Visit Diagnosis: No diagnosis found.  History of Present Illness:   Linda [name] is a 26 year old female with depression, anxiety, ADD per chart, who is referred for anxiety.  Associated Signs/Symptoms: Depression Symptoms:  {DEPRESSION SYMPTOMS:20000} (Hypo) Manic Symptoms:  {BHH MANIC SYMPTOMS:22872} Anxiety Symptoms:  {BHH ANXIETY SYMPTOMS:22873} Psychotic Symptoms:  {BHH PSYCHOTIC SYMPTOMS:22874} PTSD Symptoms: {BHH PTSD SYMPTOMS:22875}  Past Psychiatric History:  Outpatient:  Psychiatry admission:  Previous suicide attempt:  Past trials of medication:  History of violence:   Previous Psychotropic Medications: {YES/NO:21197}  Substance Abuse History in the last 12 months:  {yes no:314532}  Consequences of Substance Abuse: {BHH CONSEQUENCES OF SUBSTANCE ABUSE:22880}  Past Medical History:  Past Medical History:  Diagnosis Date  . Anxiety   . Asthma   . Heart murmur   . IBS (irritable bowel syndrome)   . Interstitial cystitis     Past Surgical History:  Procedure Laterality Date  . CESAREAN SECTION N/A 03/20/2013   Procedure: CESAREAN SECTION;  Surgeon: Oliver PilaKathy W Richardson, MD;  Location: [facility];  Service: Obstetrics;  Laterality: N/A;  . CESAREAN SECTION    . MYRINGOTOMY WITH TUBE PLACEMENT    . NO PAST SURGERIES    . TYMPANOSTOMY TUBE PLACEMENT    . UPPER GI ENDOSCOPY      Family Psychiatric History: ***  Family History:  Family History  Problem Relation Age of Onset  . Cancer Paternal Uncle   . COPD Maternal Grandfather   . Stroke Paternal Grandfather     Social History:   Social History   Social History  . Marital status: Single    Spouse name: N/A  . Number of children: N/A  . Years of education: N/A   Social History Main Topics  . Smoking status: Current  Every Day Smoker    Packs/day: 0.50    Types: Cigarettes    Start date: 01/28/2013  . Smokeless tobacco: Never Used  . Alcohol use No  . Drug use: No  . Sexual activity: Yes    Partners: Male    Birth control/ protection: None   Other Topics Concern  . Not on file   Social History Narrative   ** Merged History Encounter **        Additional Social History: ***  Allergies:   Allergies  Allergen Reactions  . Buspar [Buspirone] Other (See Comments)    Increased anxiety symptoms  . Sulfa Antibiotics Diarrhea and Nausea Only  . Sulfa Antibiotics Nausea And Vomiting  . Buspar [Buspirone] Anxiety    Metabolic Disorder Labs: Lab Results  Component Value Date   HGBA1C 5.0 11/24/2016   No results found for: PROLACTIN No results found for: CHOL, TRIG, HDL, CHOLHDL, VLDL, LDLCALC   Current Medications: Current Outpatient Prescriptions  Medication Sig Dispense Refill  . ADDERALL XR 30 MG 24 hr capsule Take 1 capsule (30 mg total) by mouth daily. 30 capsule 0  . albuterol (PROVENTIL HFA;VENTOLIN HFA) 108 (90 Base) MCG/ACT inhaler Inhale 2 puffs into the lungs every 4 (four) hours as needed. 1 Inhaler 0  . citalopram (CELEXA) 40 MG tablet TAKE 1 TABLET BY MOUTH EVERY DAY 90 tablet 1  . clonazePAM (KLONOPIN) 0.5 MG tablet Take 1 tablet (0.5 mg total) by mouth 2 (two) times daily as needed for anxiety. 30 tablet 0   No  current facility-administered medications for this visit.     Neurologic: Headache: No Seizure: No Paresthesias:No  Musculoskeletal: Strength & Muscle Tone: within normal limits Gait & Station: normal Patient leans: N/A  Psychiatric Specialty Exam: ROS  There were no vitals taken for this visit.There is no height or weight on file to calculate BMI.  General Appearance: Fairly Groomed  Eye Contact:  Good  Speech:  Clear and Coherent  Volume:  Normal  Mood:  {BHH MOOD:22306}  Affect:  {Affect (PAA):22687}  Thought Process:  Coherent and Goal Directed   Orientation:  Full (Time, Place, and [name])  Thought Content:  Logical  Suicidal Thoughts:  {ST/HT (PAA):22692}  Homicidal Thoughts:  {ST/HT (PAA):22692}  Memory:  Immediate;   Good Recent;   Good Remote;   Good  Judgement:  {Judgement (PAA):22694}  Insight:  {Insight (PAA):22695}  Psychomotor Activity:  Normal  Concentration:  Concentration: Good and Attention Span: Good  Recall:  Good  Fund of Knowledge:Good  Language: Good  Akathisia:  No  Handed:  Right  AIMS (if indicated):  N/A  Assets:  Communication Skills Desire for Improvement  ADL's:  Intact  Cognition: WNL  Sleep:  ***   Assessment  Plan  The patient demonstrates the following risk factors for suicide: Chronic risk factors for suicide include: {Chronic Risk Factors for ZOXWRUE:45409811}. Acute risk factors for suicide include: {Acute Risk Factors for BJYNWGN:56213086}. Protective factors for this patient include: {Protective Factors for Suicide VHQI:69629528}. Considering these factors, the overall suicide risk at this point appears to be {Desc; low/moderate/high:110033}. Patient {ACTION; IS/IS UXL:24401027} appropriate for outpatient follow up.   Treatment Plan Summary: Plan as above   Neysa Hotter, MD 6/27/20189:51 AM

## 2017-05-15 ENCOUNTER — Other Ambulatory Visit: Payer: Self-pay | Admitting: Family Medicine

## 2017-05-16 ENCOUNTER — Ambulatory Visit (HOSPITAL_COMMUNITY): Payer: Self-pay | Admitting: Psychiatry

## 2017-05-19 ENCOUNTER — Other Ambulatory Visit: Payer: Self-pay | Admitting: *Deleted

## 2017-05-19 MED ORDER — ALBUTEROL SULFATE HFA 108 (90 BASE) MCG/ACT IN AERS: 2.0000 | INHALATION_SPRAY | RESPIRATORY_TRACT | 0 refills | Status: DC | PRN

## 2017-06-07 ENCOUNTER — Telehealth: Payer: Self-pay | Admitting: *Deleted

## 2017-06-07 MED ORDER — ALBUTEROL SULFATE HFA 108 (90 BASE) MCG/ACT IN AERS: 2.0000 | INHALATION_SPRAY | RESPIRATORY_TRACT | 0 refills | Status: DC | PRN

## 2017-06-07 NOTE — Telephone Encounter (Signed)
Ventolin prescription sent into pharmacy.

## 2017-06-07 NOTE — Telephone Encounter (Signed)
Yes may procedd plus three ref

## 2017-06-07 NOTE — Telephone Encounter (Signed)
proventil hfa 2 puffs every 4 hours 6.7gm not covered by patient's plan. The preferred alternative is ventolin hfa aer.   [facility] [address] [place].

## 2017-06-28 ENCOUNTER — Encounter: Payer: Self-pay | Admitting: Family Medicine

## 2017-06-28 ENCOUNTER — Ambulatory Visit (INDEPENDENT_AMBULATORY_CARE_PROVIDER_SITE_OTHER): Payer: 59 | Admitting: Family Medicine

## 2017-06-28 VITALS — BP 126/68 | Temp 98.2°F | Ht 66.0 in | Wt 252.0 lb

## 2017-06-28 DIAGNOSIS — J329 Chronic sinusitis, unspecified: Secondary | ICD-10-CM | POA: Diagnosis not present

## 2017-06-28 DIAGNOSIS — R300 Dysuria: Secondary | ICD-10-CM

## 2017-06-28 LAB — POCT URINALYSIS DIPSTICK: pH, UA: 6 (ref 5.0–8.0)

## 2017-06-28 MED ORDER — CEFDINIR 300 MG PO CAPS: 300.0000 mg | ORAL_CAPSULE | Freq: Two times a day (BID) | ORAL | 0 refills | Status: DC

## 2017-06-28 NOTE — Progress Notes (Signed)
   Subjective:    Patient ID: Linda [name], female    DOB: [birth year], 26 y.o.   MRN: [id number]  Urinary Tract Infection   This is a new problem. The current episode started in the past 7 days.  Burns and stings every time goes to the restroom to urinate she has a fishy odor.taking tylenol and advil have not helped. Also having headache,sorethroat,cough,which has been going on her 2 weeks. No other concerns.  Dim appetite  Pos nasal cong   Headache frontal comes and goes, worse in the eve, all stopped up Headache sharp at times aching another times some radiation into the cheeks  Results for orders placed or performed in visit on 06/28/17  POCT urinalysis dipstick  Result Value Ref Range   Color, UA     Clarity, UA     Glucose, UA     Bilirubin, UA     Ketones, UA     Spec Grav, UA >=1.030 (A) 1.010 - 1.025   Blood, UA small    pH, UA 6.0 5.0 - 8.0   Protein, UA     Urobilinogen, UA  0.2 or 1.0 E.U./dL   Nitrite, UA     Leukocytes, UA Small (1+) (A) Negative   .energy bad  Temp 99.3 max' Review of Systems     Objective:   Physical Exam  Alert, mild malaise. Hydration good Vitals stable. frontal/ maxillary tenderness evident positive nasal congestion. pharynx normal neck supple  lungs clear/no crackles or wheezes. heart regular in rhythm       Assessment & Plan:  Impression rhinosinusitis likely post viral, discussed with patient. plan antibiotics prescribed. Questions answered. Symptomatic care discussed. warning signs discussed. WSL

## 2017-07-13 IMAGING — CR DG CHEST 2V
2 series · 2 of 2 positions shown · non-contrast
Comparison: 09/08/2015; 09/04/2015; 07/11/2015

CLINICAL DATA: Cough and fever for 1 week. Shortness of breath for
3 days. History of asthma and smoking.

EXAM:
CHEST  2 VIEW

[chest pa]
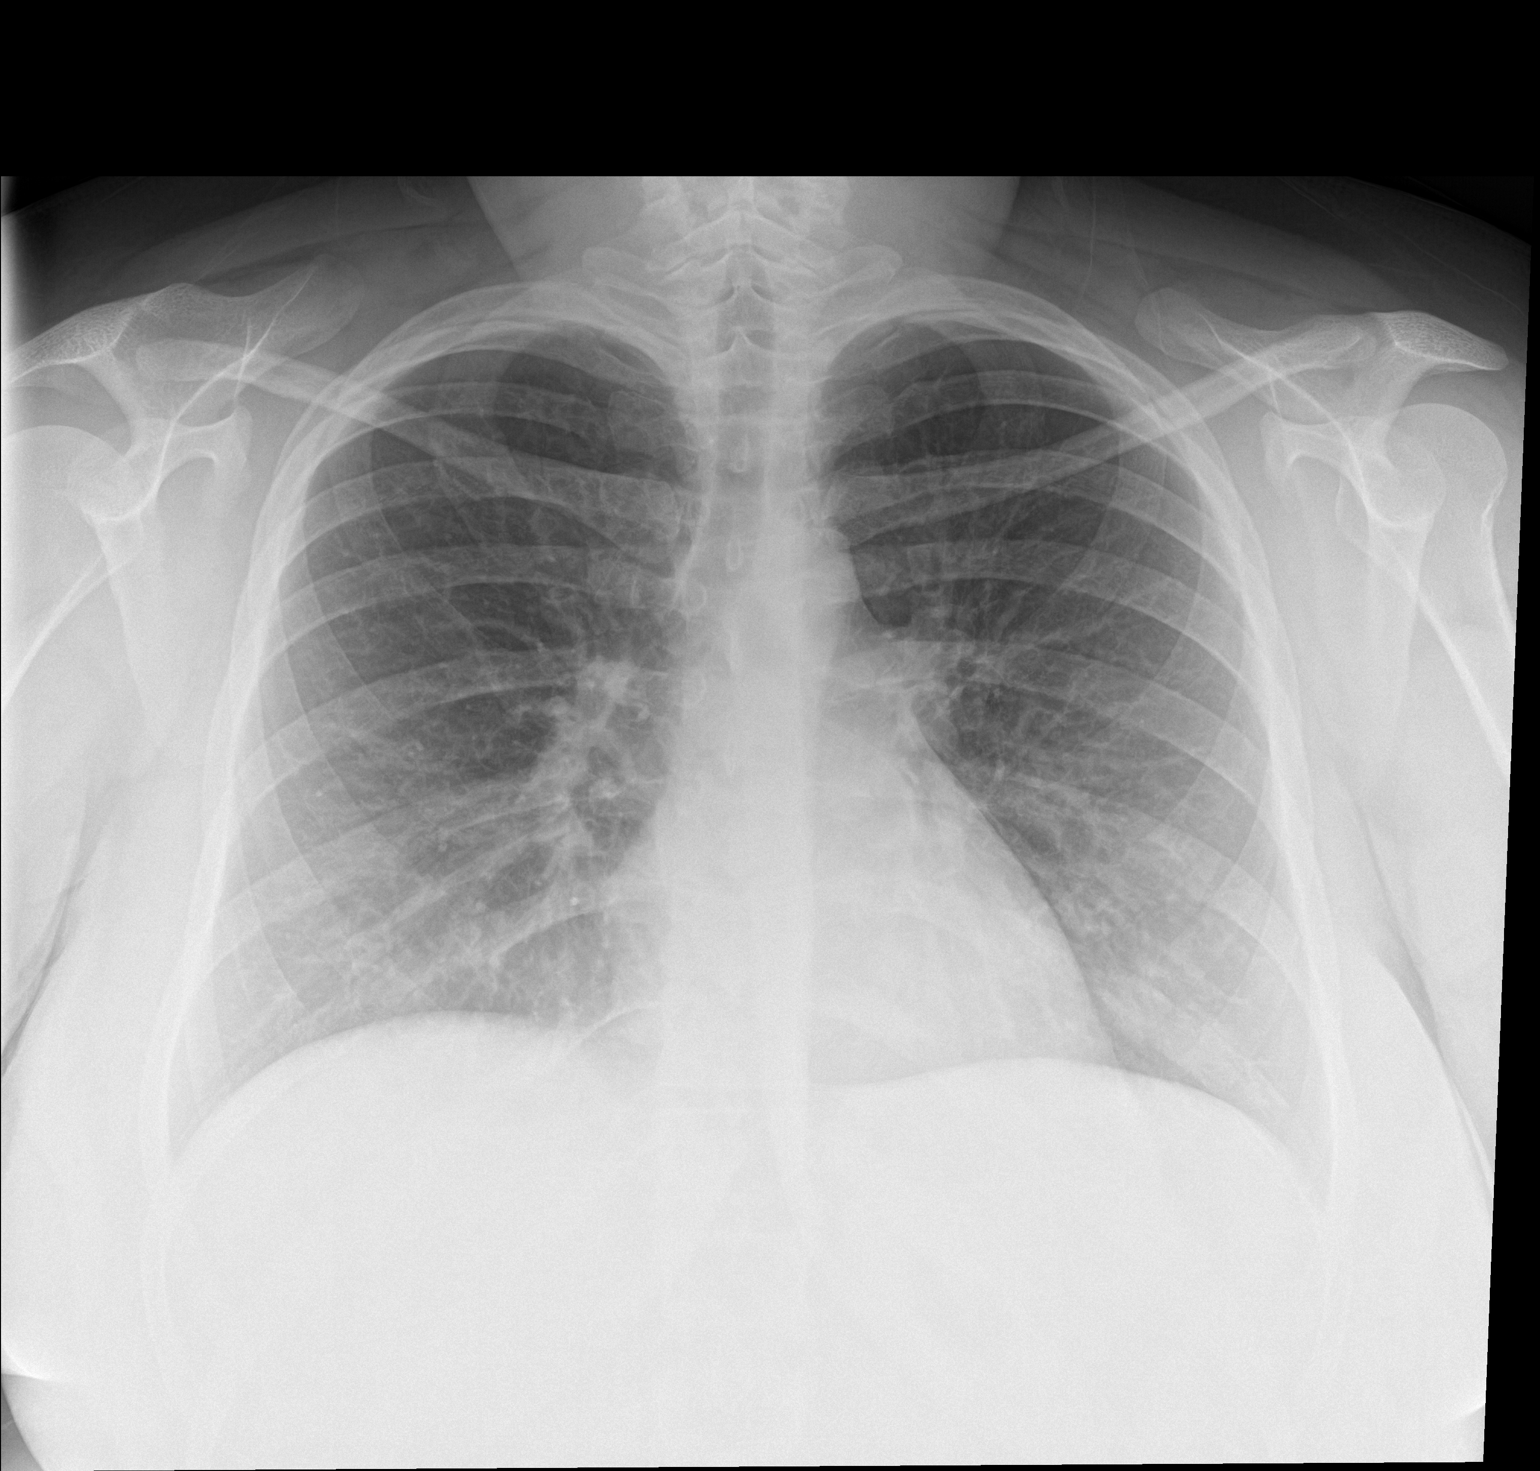

[chest lat]
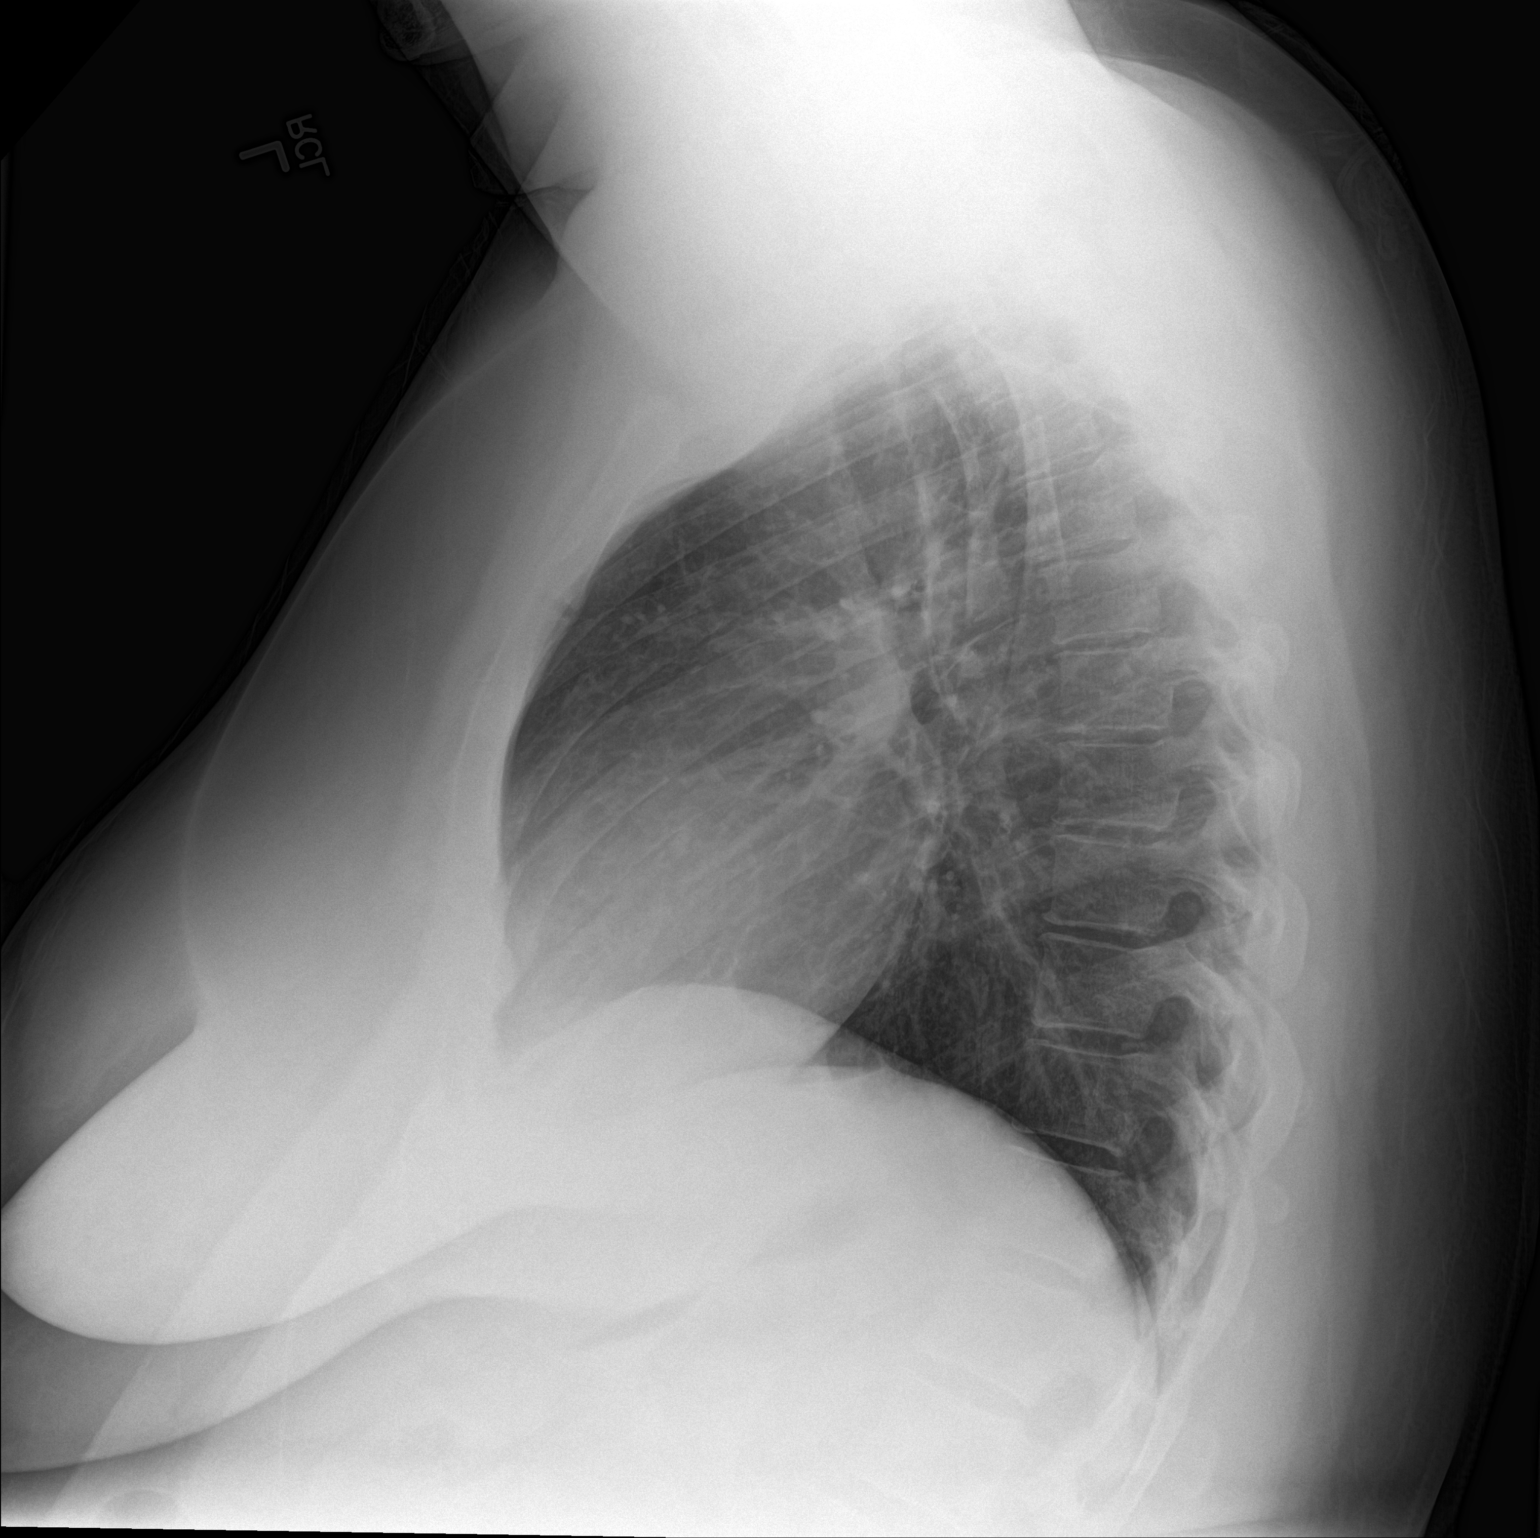

[2 of 2 positions shown; findings below may reference images not displayed]

FINDINGS: Grossly unchanged cardiac silhouette and mediastinal contours.
Veiling opacities overlying the bilateral lungs are favored to
represent overlying breast tissues. No discrete focal airspace
opacities. No pleural effusion or pneumothorax. No evidence of
edema. No acute osseus abnormalities.
IMPRESSION: No acute cardiopulmonary disease. Specifically, no evidence of
pneumonia.

## 2017-08-29 ENCOUNTER — Encounter: Payer: Self-pay | Admitting: Family Medicine

## 2017-08-29 ENCOUNTER — Ambulatory Visit (INDEPENDENT_AMBULATORY_CARE_PROVIDER_SITE_OTHER): Payer: 59 | Admitting: Family Medicine

## 2017-08-29 VITALS — BP 120/72 | Temp 97.8°F | Ht 66.0 in | Wt 246.2 lb

## 2017-08-29 DIAGNOSIS — R309 Painful micturition, unspecified: Secondary | ICD-10-CM | POA: Diagnosis not present

## 2017-08-29 LAB — POCT URINALYSIS DIPSTICK
SPEC GRAV UA: 1.01 (ref 1.010–1.025)
pH, UA: 6 (ref 5.0–8.0)

## 2017-08-29 MED ORDER — CIPROFLOXACIN HCL 500 MG PO TABS: 500.0000 mg | ORAL_TABLET | Freq: Two times a day (BID) | ORAL | 0 refills | Status: DC

## 2017-08-29 NOTE — Patient Instructions (Signed)
Consider using a probiotic ike align, but can get less expensive alternative at hte recommendation of your pharmacist

## 2017-08-29 NOTE — Progress Notes (Signed)
   Subjective:    Patient ID: Linda [name], female    DOB: [birth year], 26 y.o.   MRN: [id number]  Urinary Tract Infection   This is a new problem. The current episode started in the past 7 days. Associated symptoms comments: Low back pain, painful urination, abdominal pain,diarrhea . Treatments tried: Tylenol, Ibuprofen.    Patient states no other concerns this visit.   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 Blood pressure good on repeat. HEENT normal. Lungs clear. Heart regular rate and rhythm. No true CVA tenderness. Some low back discomfort. To palpation.  Urinalysis borderline with 2-3 white blood cells per high-power field occasional epithelial cell      Assessment & Plan:  Impression urinary tract infection versus viral syndrome with musculoskeletal features. Plan culture urine. Antibiotics prescribed symptom care discussed

## 2017-08-31 LAB — URINE CULTURE

## 2017-09-02 ENCOUNTER — Telehealth: Payer: Self-pay | Admitting: Family Medicine

## 2017-09-02 NOTE — Telephone Encounter (Signed)
Spoke with patient and informed per Dr.Steve Luking- urine culture came back negative may stop antibiotics without needing anymore.

## 2017-09-02 NOTE — Telephone Encounter (Signed)
Patient said that the Cipro she was prescribed on 08/29/17 is making her nauseous and dizzy.  She wants to know if this can be changed?  [facility] [place]

## 2017-09-02 NOTE — Telephone Encounter (Signed)
Left message return call 09/02/17 

## 2017-09-02 NOTE — Telephone Encounter (Signed)
Let pt know urine cx back and neg, may stop all abx without needing to take anymore, document cipro sesitivity

## 2017-09-05 ENCOUNTER — Other Ambulatory Visit: Payer: Self-pay | Admitting: Nurse Practitioner

## 2017-09-26 ENCOUNTER — Other Ambulatory Visit: Payer: Self-pay | Admitting: *Deleted

## 2017-09-26 MED ORDER — ALBUTEROL SULFATE HFA 108 (90 BASE) MCG/ACT IN AERS: 2.0000 | INHALATION_SPRAY | RESPIRATORY_TRACT | 0 refills | Status: DC | PRN

## 2017-11-04 ENCOUNTER — Ambulatory Visit (INDEPENDENT_AMBULATORY_CARE_PROVIDER_SITE_OTHER): Payer: 59 | Admitting: Nurse Practitioner

## 2017-11-04 ENCOUNTER — Encounter: Payer: Self-pay | Admitting: Nurse Practitioner

## 2017-11-04 VITALS — BP 124/78 | HR 84 | Ht 65.5 in | Wt 254.2 lb

## 2017-11-04 DIAGNOSIS — Z23 Encounter for immunization: Secondary | ICD-10-CM

## 2017-11-04 DIAGNOSIS — J300 Vasomotor rhinitis: Secondary | ICD-10-CM | POA: Diagnosis not present

## 2017-11-04 MED ORDER — AZITHROMYCIN 250 MG PO TABS: ORAL_TABLET | ORAL | 0 refills | Status: DC

## 2017-11-04 MED ORDER — AMOXICILLIN-POT CLAVULANATE 875-125 MG PO TABS: 1.0000 | ORAL_TABLET | Freq: Two times a day (BID) | ORAL | 0 refills | Status: DC

## 2017-11-04 NOTE — Patient Instructions (Signed)
claritin or allegra nasacort AQ or Flonase

## 2017-11-06 ENCOUNTER — Encounter: Payer: Self-pay | Admitting: Nurse Practitioner

## 2017-11-06 NOTE — Progress Notes (Signed)
Subjective:  Presents for c/o severe right ear pain x 2 weeks. Fever has resolved. Frontal area headache. No runny nose or cough. Slight pain on the left side. Slight wheeze but has not used her albuterol inhaler. Has used her mother's cortisporin ear drops.   Objective:   BP 124/78 (BP Location: Right Arm, Patient Position: Sitting)   Pulse 84   Ht 5' 5.5" (1.664 m)   Wt 254 lb 3.2 oz (115.3 kg)   LMP 10/17/2017   SpO2 97%   BMI 41.66 kg/m  NAD. Alert, oriented. Lt TM clear effusion. Rt TM clear effusion. Mild erythema mid canal. No tenderness with movement of pinna or tragus. No drainage. Pharynx clear. Neck supple with mild anterior adenopathy. Lungs clear. Heart RRR.   Assessment:  Vasomotor rhinitis  Need for influenza vaccination - Plan: Flu Vaccine QUAD 6+ mos PF IM (Fluarix Quad PF)    Plan:   Meds ordered this encounter  Medications  . DISCONTD: azithromycin (ZITHROMAX Z-PAK) 250 MG tablet    Sig: Take 2 tablets (500 mg) on  Day 1,  followed by 1 tablet (250 mg) once daily on Days 2 through 5.    Dispense:  6 each    Refill:  0    Order Specific Question:   Supervising Provider    Answer:   Merlyn AlbertLUKING, WILLIAM S [[id number]]  . DISCONTD: azithromycin (ZITHROMAX Z-PAK) 250 MG tablet    Sig: Take 2 tablets (500 mg) on  Day 1,  followed by 1 tablet (250 mg) once daily on Days 2 through 5.    Dispense:  6 each    Refill:  0  . amoxicillin-clavulanate (AUGMENTIN) 875-125 MG tablet    Sig: Take 1 tablet by mouth 2 (two) times daily.    Dispense:  20 tablet    Refill:  0    Please place on hold unless patient calls, thanks.    Order Specific Question:   Supervising Provider    Answer:   Merlyn AlbertLUKING, WILLIAM S [[id number]]   claritin or allegra nasacort AQ or Flonase Rx for Augmentin sent in on hold in case it is needed over the holiday. Warning signs reviewed. Call back if worsens or persists.

## 2017-11-11 ENCOUNTER — Telehealth: Payer: Self-pay | Admitting: Family Medicine

## 2017-11-11 ENCOUNTER — Other Ambulatory Visit: Payer: Self-pay | Admitting: Nurse Practitioner

## 2017-11-11 MED ORDER — AMOXICILLIN-POT CLAVULANATE 875-125 MG PO TABS: 1.0000 | ORAL_TABLET | Freq: Two times a day (BID) | ORAL | 0 refills | Status: DC

## 2017-11-11 NOTE — Telephone Encounter (Addendum)
Patient notified

## 2017-11-11 NOTE — Telephone Encounter (Signed)
[facility]; [facility]

## 2017-11-11 NOTE — Telephone Encounter (Signed)
Patient seen Linda JonesCarolyn on 11/04/17 for vasomotor rhinitis.  She is still having cough, phlegm, trouble breathing, ear pressure.  She wants to know if another antibiotic can be called in.  [facility] [place]

## 2017-11-11 NOTE — Telephone Encounter (Signed)
Augmentin is on hold at pharmacy since her visit in case she needed it. I will send it in again to be safe.

## 2017-11-22 ENCOUNTER — Encounter: Payer: Self-pay | Admitting: Nurse Practitioner

## 2017-11-22 ENCOUNTER — Ambulatory Visit (INDEPENDENT_AMBULATORY_CARE_PROVIDER_SITE_OTHER): Payer: 59 | Admitting: Nurse Practitioner

## 2017-11-22 VITALS — BP 118/74 | Ht 65.5 in | Wt 251.0 lb

## 2017-11-22 DIAGNOSIS — F988 Other specified behavioral and emotional disorders with onset usually occurring in childhood and adolescence: Secondary | ICD-10-CM | POA: Diagnosis not present

## 2017-11-22 DIAGNOSIS — F418 Other specified anxiety disorders: Secondary | ICD-10-CM | POA: Diagnosis not present

## 2017-11-22 MED ORDER — AMPHETAMINE-DEXTROAMPHET ER 20 MG PO CP24: 20.0000 mg | ORAL_CAPSULE | ORAL | 0 refills | Status: DC

## 2017-11-22 MED ORDER — CLONAZEPAM 0.5 MG PO TABS: 0.5000 mg | ORAL_TABLET | Freq: Two times a day (BID) | ORAL | 0 refills | Status: DC | PRN

## 2017-11-22 MED ORDER — CITALOPRAM HYDROBROMIDE 40 MG PO TABS: 40.0000 mg | ORAL_TABLET | Freq: Every day | ORAL | 1 refills | Status: DC

## 2017-11-22 NOTE — Progress Notes (Signed)
Subjective: Presents for recheck on her anxiety.  Uses a rare Klonopin only for extreme anxiety.  Has been on Celexa for several years, was working well up until a couple of years ago.  Patient has been noticing more breakthrough anxiety symptoms.  Also on Adderall X are 30 mg for her ADD.  Feels the dose is too high and would like to reduce this  Due to agitation.  Normal appetite.  Sleeping without difficulty.  Denies any suicidal or homicidal thoughts or ideation.  Has seen mental health in the past but this was several years ago.  Objective:   BP 118/74   Ht 5' 5.5" (1.664 m)   Wt 251 lb (113.9 kg)   BMI 41.13 kg/m  NAD.  Alert, oriented.  Calm affect.  Thoughts logical coherent and relevant.  Dressed appropriately.  Making good eye contact.  Lungs clear.  Heart regular rate and rhythm.  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 citalopram (CELEXA) 40 MG tablet    Sig: Take 1 tablet (40 mg total) by mouth daily.    Dispense:  90 tablet    Refill:  1    Order Specific Question:   Supervising Provider    Answer:   Merlyn AlbertLUKING, WILLIAM S [[id number]]  . clonazePAM (KLONOPIN) 0.5 MG tablet    Sig: Take 1 tablet (0.5 mg total) by mouth 2 (two) times daily as needed for anxiety.    Dispense:  30 tablet    Refill:  0    Order Specific Question:   Supervising Provider    Answer:   Merlyn AlbertLUKING, WILLIAM S [[id number]]  . amphetamine-dextroamphetamine (ADDERALL XR) 20 MG 24 hr capsule    Sig: Take 1 capsule (20 mg total) by mouth every morning.    Dispense:  30 capsule    Refill:  0    Order Specific Question:   Supervising Provider    Answer:   Merlyn AlbertLUKING, WILLIAM S [[id number]]   Continue Celexa and Klonopin as directed for now.  Refer to psychiatry and mental health counselor.   Decrease Adderall to 20 mg daily.  Patient to give us feedback within the next month to let us know if this dosage is working or if any adverse effects.  Patient to seek help immediately if any severe problems.  Strongly recommend preventive health physical and Pap smear. Return in about 4 months (around 03/22/2018) for ADD check up.

## 2017-11-28 ENCOUNTER — Emergency Department (HOSPITAL_COMMUNITY)
Admission: EM | Admit: 2017-11-28 | Discharge: 2017-11-28 | Disposition: A | Discharge status: Home / Self Care | Payer: 59 | Attending: Emergency Medicine | Admitting: Emergency Medicine

## 2017-11-28 ENCOUNTER — Encounter: Payer: Self-pay | Admitting: Family Medicine

## 2017-11-28 ENCOUNTER — Encounter (HOSPITAL_COMMUNITY): Payer: Self-pay | Admitting: *Deleted

## 2017-11-28 ENCOUNTER — Other Ambulatory Visit: Payer: Self-pay

## 2017-11-28 DIAGNOSIS — F17210 Nicotine dependence, cigarettes, uncomplicated: Secondary | ICD-10-CM | POA: Diagnosis not present

## 2017-11-28 DIAGNOSIS — Z79899 Other long term (current) drug therapy: Secondary | ICD-10-CM | POA: Insufficient documentation

## 2017-11-28 DIAGNOSIS — N3091 Cystitis, unspecified with hematuria: Secondary | ICD-10-CM | POA: Insufficient documentation

## 2017-11-28 DIAGNOSIS — E039 Hypothyroidism, unspecified: Secondary | ICD-10-CM | POA: Insufficient documentation

## 2017-11-28 DIAGNOSIS — J45909 Unspecified asthma, uncomplicated: Secondary | ICD-10-CM | POA: Insufficient documentation

## 2017-11-28 DIAGNOSIS — R300 Dysuria: Secondary | ICD-10-CM | POA: Diagnosis present

## 2017-11-28 LAB — URINALYSIS, ROUTINE W REFLEX MICROSCOPIC
BACTERIA UA: NONE SEEN
Bilirubin Urine: NEGATIVE
Glucose, UA: NEGATIVE mg/dL
KETONES UR: NEGATIVE mg/dL
Nitrite: POSITIVE — AB
PROTEIN: 100 mg/dL — AB
Specific Gravity, Urine: 1.012 (ref 1.005–1.030)
pH: 5 (ref 5.0–8.0)

## 2017-11-28 LAB — POC URINE PREG, ED: Preg Test, Ur: NEGATIVE

## 2017-11-28 MED ORDER — CEFTRIAXONE SODIUM 1 G IJ SOLR
INTRAMUSCULAR | Status: AC
  Filled 2017-11-28: qty 10

## 2017-11-28 MED ORDER — LIDOCAINE HCL (PF) 1 % IJ SOLN
INTRAMUSCULAR | Status: AC
  Filled 2017-11-28: qty 2

## 2017-11-28 MED ORDER — CEPHALEXIN 500 MG PO CAPS: 500.0000 mg | ORAL_CAPSULE | Freq: Two times a day (BID) | ORAL | 0 refills | Status: DC

## 2017-11-28 MED ORDER — CEFTRIAXONE SODIUM 1 G IJ SOLR
1.0000 g | Freq: Once | INTRAMUSCULAR | Status: AC
Start: 2017-11-28 — End: 2017-11-28
  Administered 2017-11-28: 1 g via INTRAMUSCULAR
  Filled 2017-11-28: qty 10

## 2017-11-28 MED ORDER — KETOROLAC TROMETHAMINE 60 MG/2ML IM SOLN
60.0000 mg | Freq: Once | INTRAMUSCULAR | Status: DC
  Filled 2017-11-28: qty 2

## 2017-11-28 MED ORDER — OXYCODONE-ACETAMINOPHEN 5-325 MG PO TABS
1.0000 | ORAL_TABLET | Freq: Once | ORAL | Status: DC
  Filled 2017-11-28: qty 1

## 2017-11-28 NOTE — ED Provider Notes (Signed)
[facility] EMERGENCY DEPARTMENT Provider Note   CSN: [id number] Arrival date & time: 11/28/17  0093     History   Chief Complaint Chief Complaint  Patient presents with  . Dysuria    HPI Linda [name] is a 27 y.o. female.  Patient presents to the emergency department for evaluation of urinary frequency, urinary urgency, low urine volume, dysuria and hematuria.  Symptoms began this evening.  Patient reports that she has a history of recurrent urinary tract infection.  She is not experiencing any flank pain, fever, nausea, vomiting.      Past Medical History:  Diagnosis Date  . Anxiety   . Asthma   . Heart murmur   . IBS (irritable bowel syndrome)   . Interstitial cystitis     Patient Active Problem List   Diagnosis Date Noted  . Depression with anxiety 03/28/2017  . Hypothyroidism 06/21/2014  . Interstitial cystitis 06/14/2014  . 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 Morbid obesity (HCC) 02/07/2014  . Asthma, chronic 04/15/2013  . Chronic anxiety 04/15/2013  . Cervical strain 03/07/2012  . Sprain and strain of unspecified site of shoulder and upper arm 03/07/2012  . Pain in joint, shoulder region 03/07/2012    Past Surgical History:  Procedure Laterality Date  . CESAREAN SECTION N/A 03/20/2013   Procedure: CESAREAN SECTION;  Surgeon: [name], MD;  Location: [facility];  Service: Obstetrics;  Laterality: N/A;  . CESAREAN SECTION    . MYRINGOTOMY WITH TUBE PLACEMENT    . NO PAST SURGERIES    . TYMPANOSTOMY TUBE PLACEMENT    . UPPER GI ENDOSCOPY      OB History    Gravida Para Term Preterm AB Living   1 1 0 1 0 1   SAB TAB Ectopic Multiple Live Births   0 0 0   1       Home Medications    Prior to Admission medications   Medication Sig Start Date End Date Taking? Authorizing Provider  albuterol (PROVENTIL HFA;VENTOLIN HFA) 108 (90 Base) MCG/ACT inhaler Inhale 2 puffs every 4 (four) hours as needed into the  lungs for wheezing or shortness of breath. 09/26/17   Campbell Riches, NP  amphetamine-dextroamphetamine (ADDERALL XR) 20 MG 24 hr capsule Take 1 capsule (20 mg total) by mouth every morning. 11/22/17   Campbell Riches, NP  cephALEXin (KEFLEX) 500 MG capsule Take 1 capsule (500 mg total) by mouth 2 (two) times daily. 11/28/17   Gilda Crease, MD  citalopram (CELEXA) 40 MG tablet Take 1 tablet (40 mg total) by mouth daily. 11/22/17   Campbell Riches, NP  clonazePAM (KLONOPIN) 0.5 MG tablet Take 1 tablet (0.5 mg total) by mouth 2 (two) times daily as needed for anxiety. 11/22/17   Campbell Riches, NP    Family History Family History  Problem Relation Age of Onset  . Cancer Paternal Uncle   . COPD Maternal Grandfather   . Stroke Paternal Grandfather     Social History Social History   Tobacco Use  . Smoking status: Current Every Day Smoker    Packs/day: 0.50    Types: Cigarettes    Start date: 01/28/2013  . Smokeless tobacco: Never Used  Substance Use Topics  . Alcohol use: No    Alcohol/week: 0.0 oz  . Drug use: No     Allergies   Buspar [buspirone]; Ciprofloxacin; Sulfa antibiotics; Sulfa antibiotics; and Buspar [buspirone]   Review of Systems Review  of Systems  Genitourinary: Positive for dysuria, frequency, hematuria and urgency.  All other systems reviewed and are negative.    Physical Exam Updated Vital Signs BP (!) 119/56 (BP Location: Right Arm)   Pulse 95   Temp 97.8 F (36.6 C) (Oral)   Resp 17   Ht 5' 5.5" (1.664 m)   Wt 113.9 kg (251 lb)   LMP 11/24/2017   SpO2 98%   BMI 41.13 kg/m   Physical Exam  Constitutional: She is oriented to person, place, and time. She appears well-developed and well-nourished. No distress.  HENT:  Head: Normocephalic and atraumatic.  Right Ear: Hearing normal.  Left Ear: Hearing normal.  Nose: Nose normal.  Mouth/Throat: Oropharynx is clear and moist and mucous membranes are normal.  Eyes: Conjunctivae  and EOM are normal. Pupils are equal, round, and reactive to light.  Neck: Normal range of motion. Neck supple.  Cardiovascular: Regular rhythm, S1 normal and S2 normal. Exam reveals no gallop and no friction rub.  No murmur heard. Pulmonary/Chest: Effort normal and breath sounds normal. No respiratory distress. She exhibits no tenderness.  Abdominal: Soft. Normal appearance and bowel sounds are normal. There is no hepatosplenomegaly. There is tenderness in the suprapubic area. There is no rebound, no guarding, no tenderness at McBurney's point and negative Murphy's sign. No hernia.  Musculoskeletal: Normal range of motion.  Neurological: She is alert and oriented to person, place, and time. She has normal strength. No cranial nerve deficit or sensory deficit. Coordination normal. GCS eye subscore is 4. GCS verbal subscore is 5. GCS motor subscore is 6.  Skin: Skin is warm, dry and intact. No rash noted. No cyanosis.  Psychiatric: She has a normal mood and affect. Her speech is normal and behavior is normal. Thought content normal.  Nursing note and vitals reviewed.    ED Treatments / Results  Labs (all labs ordered are listed, but only abnormal results are displayed) Labs Reviewed  URINALYSIS, ROUTINE W REFLEX MICROSCOPIC - Abnormal; Notable for the following components:      Result Value   Color, Urine AMBER (*)    APPearance HAZY (*)    Hgb urine dipstick LARGE (*)    Protein, ur 100 (*)    Nitrite POSITIVE (*)    Leukocytes, UA TRACE (*)    Squamous Epithelial / LPF 6-30 (*)    All other components within normal limits  POC URINE PREG, ED    EKG  EKG Interpretation None       Radiology No results found.  Procedures Procedures (including critical care time)  Medications Ordered in ED Medications  ketorolac (TORADOL) injection 60 mg (not administered)  oxyCODONE-acetaminophen (PERCOCET/ROXICET) 5-325 MG per tablet 1 tablet (not administered)  cefTRIAXone (ROCEPHIN)  injection 1 g (not administered)     Initial Impression / Assessment and Plan / ED Course  I have reviewed the triage vital signs and the nursing notes.  Pertinent labs & imaging results that were available during my care of the patient were reviewed by me and considered in my medical decision making (see chart for details).     Patient presents with hematuria, urinary frequency and dysuria with discomfort in the suprapubic area.  Patient reports increased pain when she urinates and at the end of urine stream.  She has not have any fever, nausea, vomiting.  There is no flank pain or CVA tenderness.  Urine did have gross blood.  Symptoms are most consistent with hemorrhagic cystitis.  Urine does  have positive nitrite, but no bacteria.  It appears to be a somewhat contaminated sample.  Her symptoms, however, are very consistent with cystitis and she has had multiple episodes in the past, will treat empirically.  Final Clinical Impressions(s) / ED Diagnoses   Final diagnoses:  Hemorrhagic cystitis    ED Discharge Orders        Ordered    cephALEXin (KEFLEX) 500 MG capsule  2 times daily     11/28/17 0405       Gilda CreasePollina, Christopher J, MD 11/28/17 0405

## 2017-11-28 NOTE — ED Triage Notes (Signed)
Pt c/o burning with urination and states she has blood in her urine that started around 2330 last night; pt states she has urinary urgency

## 2017-12-16 ENCOUNTER — Ambulatory Visit (HOSPITAL_COMMUNITY): Payer: Self-pay | Admitting: Psychiatry

## 2017-12-29 ENCOUNTER — Ambulatory Visit (HOSPITAL_COMMUNITY): Payer: Self-pay | Admitting: Psychiatry

## 2018-01-16 ENCOUNTER — Ambulatory Visit: Payer: 59 | Admitting: Family Medicine

## 2018-01-18 ENCOUNTER — Ambulatory Visit (INDEPENDENT_AMBULATORY_CARE_PROVIDER_SITE_OTHER): Payer: 59 | Admitting: Nurse Practitioner

## 2018-01-18 ENCOUNTER — Encounter: Payer: Self-pay | Admitting: Nurse Practitioner

## 2018-01-18 VITALS — BP 132/70 | Temp 98.4°F | Ht 65.5 in | Wt 260.0 lb

## 2018-01-18 DIAGNOSIS — B9689 Other specified bacterial agents as the cause of diseases classified elsewhere: Secondary | ICD-10-CM | POA: Diagnosis not present

## 2018-01-18 DIAGNOSIS — F988 Other specified behavioral and emotional disorders with onset usually occurring in childhood and adolescence: Secondary | ICD-10-CM

## 2018-01-18 DIAGNOSIS — J069 Acute upper respiratory infection, unspecified: Secondary | ICD-10-CM

## 2018-01-18 MED ORDER — AMPHETAMINE-DEXTROAMPHET ER 25 MG PO CP24: 25.0000 mg | ORAL_CAPSULE | ORAL | 0 refills | Status: DC

## 2018-01-18 MED ORDER — CEFDINIR 300 MG PO CAPS: 300.0000 mg | ORAL_CAPSULE | Freq: Two times a day (BID) | ORAL | 0 refills | Status: DC

## 2018-01-18 NOTE — Progress Notes (Signed)
Subjective: Presents for complaints of sore throat and cough for the past 2 weeks.  Took amoxicillin for almost 2 weeks, finished 2 days ago.  A coworker was diagnosed with strep a few days ago.  Low-grade fever.  Sore throat.  Generalized headache.  Right ear pain.  No head congestion.  Cough especially in the mornings producing green mucus.  No wheezing.  Also was decreased to Adderall XR 20 mg at last visit, this does not seem to be working as well.  Objective:   BP 132/70   Temp 98.4 F (36.9 C) (Oral)   Ht 5' 5.5" (1.664 m)   Wt 260 lb (117.9 kg)   BMI 42.61 kg/m  NAD.  Alert, oriented.  TMs significantly retracted, no erythema.  Pharynx posterior mildly erythematous with green PND noted.  A tiny open superficial lesion noted on the left tonsil.  Otherwise no lesions are noted.  Neck supple with minimal anterior cervical adenopathy.  Lungs clear.  Heart regular rate and rhythm.  Assessment:  Problem List Items Addressed This Visit      Other   ADD (attention deficit disorder) without hyperactivity    Other Visit Diagnoses    Bacterial upper respiratory infection    -  Primary   Relevant Medications   cefdinir (OMNICEF) 300 MG capsule       Plan:   Meds ordered this encounter  Medications  . cefdinir (OMNICEF) 300 MG capsule    Sig: Take 1 capsule (300 mg total) by mouth 2 (two) times daily.    Dispense:  20 capsule    Refill:  0    Order Specific Question:   Supervising Provider    Answer:   Merlyn AlbertLUKING, WILLIAM S [[id number]]  . amphetamine-dextroamphetamine (ADDERALL XR) 25 MG 24 hr capsule    Sig: Take 1 capsule by mouth every morning.    Dispense:  30 capsule    Refill:  0    Order Specific Question:   Supervising Provider    Answer:   Merlyn AlbertLUKING, WILLIAM S [[id number]]   Continue OTC meds as directed for congestion and cough.  Increase Adderall XR to 25 mg.  Patient is to let us know within the next month regarding this dosing before further refills.  Call back if symptoms worsen or  persist.

## 2018-01-24 NOTE — Progress Notes (Deleted)
Psychiatric Initial Adult Assessment   Patient Identification: Linda [name] MRN:  [id number] Date of Evaluation:  01/24/2018 Referral Source: *** Chief Complaint:   Visit Diagnosis: No diagnosis found.  History of Present Illness:   Linda [name] is a 27 y.o. year old female with a history of depression, ADD per chart, who is referred for     Associated Signs/Symptoms: Depression Symptoms:  {DEPRESSION SYMPTOMS:20000} (Hypo) Manic Symptoms:  {BHH MANIC SYMPTOMS:22872} Anxiety Symptoms:  {BHH ANXIETY SYMPTOMS:22873} Psychotic Symptoms:  {BHH PSYCHOTIC SYMPTOMS:22874} PTSD Symptoms: {BHH PTSD SYMPTOMS:22875}  Past Psychiatric History:  Outpatient:  Psychiatry admission:  Previous suicide attempt:  Past trials of medication:  History of violence:   Previous Psychotropic Medications: {YES/NO:21197}  Substance Abuse History in the last 12 months:  {yes no:314532}  Consequences of Substance Abuse: {BHH CONSEQUENCES OF SUBSTANCE ABUSE:22880}  Past Medical History:  Past Medical History:  Diagnosis Date  . Anxiety   . Asthma   . Heart murmur   . IBS (irritable bowel syndrome)   . Interstitial cystitis     Past Surgical History:  Procedure Laterality Date  . CESAREAN SECTION N/A 03/20/2013   Procedure: CESAREAN SECTION;  Surgeon: [name], MD;  Location: [facility];  Service: Obstetrics;  Laterality: N/A;  . CESAREAN SECTION    . MYRINGOTOMY WITH TUBE PLACEMENT    . NO PAST SURGERIES    . TYMPANOSTOMY TUBE PLACEMENT    . UPPER GI ENDOSCOPY      Family Psychiatric History: ***  Family History:  Family History  Problem Relation Age of Onset  . Cancer Paternal Uncle   . COPD Maternal Grandfather   . Stroke Paternal Grandfather     Social History:   Social History   Socioeconomic History  . Marital status: Single    Spouse name: Not on file  . Number of children: Not on file  . Years of education: Not on file  . Highest education level: Not on  file  Social Needs  . Financial resource strain: Not on file  . Food insecurity - worry: Not on file  . Food insecurity - inability: Not on file  . Transportation needs - medical: Not on file  . Transportation needs - non-medical: Not on file  Occupational History  . Not on file  Tobacco Use  . Smoking status: Current Every Day Smoker    Packs/day: 0.50    Types: Cigarettes    Start date: 01/28/2013  . Smokeless tobacco: Never Used  Substance and Sexual Activity  . Alcohol use: No    Alcohol/week: 0.0 oz  . Drug use: No  . Sexual activity: Yes    Partners: Male    Birth control/protection: None  Other Topics Concern  . Not on file  Social History Narrative   ** Merged History Encounter **        Additional Social History: ***  Allergies:   Allergies  Allergen Reactions  . Buspar [Buspirone] Other (See Comments)    Increased anxiety symptoms  . Ciprofloxacin Nausea Only  . Sulfa Antibiotics Diarrhea and Nausea Only  . Sulfa Antibiotics Nausea And Vomiting  . Buspar [Buspirone] Anxiety    Metabolic Disorder Labs: Lab Results  Component Value Date   HGBA1C 5.0 11/24/2016   No results found for: PROLACTIN No results found for: CHOL, TRIG, HDL, CHOLHDL, VLDL, LDLCALC   Current Medications: Current Outpatient Medications  Medication Sig Dispense Refill  . albuterol (PROVENTIL HFA;VENTOLIN HFA) 108 (90 Base) MCG/ACT inhaler  Inhale 2 puffs every 4 (four) hours as needed into the lungs for wheezing or shortness of breath. 1 Inhaler 0  . amphetamine-dextroamphetamine (ADDERALL XR) 25 MG 24 hr capsule Take 1 capsule by mouth every morning. 30 capsule 0  . cefdinir (OMNICEF) 300 MG capsule Take 1 capsule (300 mg total) by mouth 2 (two) times daily. 20 capsule 0  . cephALEXin (KEFLEX) 500 MG capsule Take 1 capsule (500 mg total) by mouth 2 (two) times daily. 14 capsule 0  . citalopram (CELEXA) 40 MG tablet Take 1 tablet (40 mg total) by mouth daily. 90 tablet 1  .  clonazePAM (KLONOPIN) 0.5 MG tablet Take 1 tablet (0.5 mg total) by mouth 2 (two) times daily as needed for anxiety. 30 tablet 0   No current facility-administered medications for this visit.     Neurologic: Headache: No Seizure: No Paresthesias:No  Musculoskeletal: Strength & Muscle Tone: within normal limits Gait & Station: normal Patient leans: N/A  Psychiatric Specialty Exam: ROS  There were no vitals taken for this visit.There is no height or weight on file to calculate BMI.  General Appearance: Fairly Groomed  Eye Contact:  Good  Speech:  Clear and Coherent  Volume:  Normal  Mood:  {BHH MOOD:22306}  Affect:  {Affect (PAA):22687}  Thought Process:  Coherent and Goal Directed  Orientation:  Full (Time, Place, and Person)  Thought Content:  Logical  Suicidal Thoughts:  {ST/HT (PAA):22692}  Homicidal Thoughts:  {ST/HT (PAA):22692}  Memory:  Immediate;   Good Recent;   Good Remote;   Good  Judgement:  {Judgement (PAA):22694}  Insight:  {Insight (PAA):22695}  Psychomotor Activity:  Normal  Concentration:  Concentration: Good and Attention Span: Good  Recall:  Good  Fund of Knowledge:Good  Language: Good  Akathisia:  No  Handed:  Right  AIMS (if indicated):  N/A  Assets:  Communication Skills Desire for Improvement  ADL's:  Intact  Cognition: WNL  Sleep:  ***   Assessment  Plan  The patient demonstrates the following risk factors for suicide: Chronic risk factors for suicide include: {Chronic Risk Factors for ZOXWRUE:45409811}Suicide:30414011}. Acute risk factors for suicide include: {Acute Risk Factors for BJYNWGN:56213086}Suicide:30414012}. Protective factors for this patient include: {Protective Factors for Suicide VHQI:69629528}Risk:30414013}. Considering these factors, the overall suicide risk at this point appears to be {Desc; low/moderate/high:110033}. Patient {ACTION; IS/IS UXL:24401027}OT:21021397} appropriate for outpatient follow up.   Treatment Plan Summary: Plan as above   Neysa Hottereina Kurt Azimi, MD 3/12/20198:51  AM

## 2018-01-26 ENCOUNTER — Ambulatory Visit (HOSPITAL_COMMUNITY): Payer: 59 | Admitting: Psychiatry

## 2018-01-27 ENCOUNTER — Other Ambulatory Visit: Payer: Self-pay | Admitting: *Deleted

## 2018-01-27 ENCOUNTER — Telehealth: Payer: Self-pay | Admitting: Family Medicine

## 2018-01-27 MED ORDER — FLUCONAZOLE 150 MG PO TABS: ORAL_TABLET | ORAL | 0 refills | Status: DC

## 2018-01-27 NOTE — Telephone Encounter (Signed)
Patient said that she developed a yeast infection due to being prescribed antibiotics recently.  She is requesting something called in for this.  [facility] on [address]

## 2018-01-27 NOTE — Telephone Encounter (Signed)
talked with pt and she is having White vaginal discharge and itching. Diflucan 150mg  #2 one po 3 days apart per dr Lorin Picketscott. Pt notified med  Sent to pharm.

## 2018-02-14 ENCOUNTER — Ambulatory Visit (INDEPENDENT_AMBULATORY_CARE_PROVIDER_SITE_OTHER): Payer: 59 | Admitting: Family Medicine

## 2018-02-14 ENCOUNTER — Encounter: Payer: Self-pay | Admitting: Family Medicine

## 2018-02-14 VITALS — BP 116/82 | Temp 98.5°F | Ht 66.0 in | Wt 264.0 lb

## 2018-02-14 DIAGNOSIS — J019 Acute sinusitis, unspecified: Secondary | ICD-10-CM

## 2018-02-14 MED ORDER — AMOXICILLIN-POT CLAVULANATE 875-125 MG PO TABS: 1.0000 | ORAL_TABLET | Freq: Two times a day (BID) | ORAL | 0 refills | Status: DC

## 2018-02-14 NOTE — Progress Notes (Signed)
   Subjective:    Patient ID: Linda [name] PersonLisabeth D Nadeau, female    DOB: 27-Feb-1991, 27 y.o.   MRN: [id number]  HPI Patient is here today with complaints of cough and congestion,wheezing,headache, fever for the last two weeks. Has been taking tylenol ,ibuprofen,allegra,cefdinar.  For 5 days a head congestion drainage coughing no wheezing difficulty breathing no vomiting or diarrhea she is also had several weeks of head congestion drainage coughing that did not respond to the antibiotics and allergy medicine Review of Systems  Constitutional: Negative for activity change and fever.  HENT: Positive for congestion and rhinorrhea. Negative for ear pain.   Eyes: Negative for discharge.  Respiratory: Positive for cough. Negative for shortness of breath and wheezing.   Cardiovascular: Negative for chest pain.       Objective:   Physical Exam  Constitutional: She appears well-developed.  HENT:  Head: Normocephalic.  Right Ear: External ear normal.  Left Ear: External ear normal.  Nose: Nose normal.  Mouth/Throat: Oropharynx is clear and moist. No oropharyngeal exudate.  Eyes: Right eye exhibits no discharge. Left eye exhibits no discharge.  Neck: Neck supple. No tracheal deviation present.  Cardiovascular: Normal rate and normal heart sounds.  No murmur heard. Pulmonary/Chest: Effort normal and breath sounds normal. She has no wheezes. She has no rales.  Lymphadenopathy:    She has no cervical adenopathy.  Skin: Skin is warm and dry.  Nursing note and vitals reviewed.         Assessment & Plan:  Ongoing issues Allergic rhinitis Use allergy medicine Persistent sinusitis 2 weeks of Augmentin Follow-up if ongoing troubles

## 2018-02-15 ENCOUNTER — Telehealth: Payer: Self-pay | Admitting: Family Medicine

## 2018-02-15 ENCOUNTER — Other Ambulatory Visit: Payer: Self-pay | Admitting: *Deleted

## 2018-02-15 MED ORDER — AMOXICILLIN-POT CLAVULANATE 875-125 MG PO TABS: 1.0000 | ORAL_TABLET | Freq: Two times a day (BID) | ORAL | 0 refills | Status: DC

## 2018-02-15 NOTE — Telephone Encounter (Signed)
Pt was seen yesterday, gave us the wrong pharmacy info for her meds  Please send any meds from yesterday (02/14/18) to [facility]  Please change her preferred pharmacy to [facility]  Please call pt when done [phone]

## 2018-02-15 NOTE — Telephone Encounter (Signed)
Called [facility] and canceled the augmentin and [facility]. Pt notified.

## 2018-02-26 ENCOUNTER — Emergency Department (HOSPITAL_COMMUNITY): Payer: Medicaid Other

## 2018-02-26 ENCOUNTER — Other Ambulatory Visit: Payer: Self-pay

## 2018-02-26 ENCOUNTER — Encounter (HOSPITAL_COMMUNITY): Payer: Self-pay | Admitting: *Deleted

## 2018-02-26 ENCOUNTER — Emergency Department (HOSPITAL_COMMUNITY)
Admission: EM | Admit: 2018-02-26 | Discharge: 2018-02-26 | Disposition: A | Discharge status: Home / Self Care | Payer: Medicaid Other | Attending: Emergency Medicine | Admitting: Emergency Medicine

## 2018-02-26 DIAGNOSIS — Z79899 Other long term (current) drug therapy: Secondary | ICD-10-CM | POA: Insufficient documentation

## 2018-02-26 DIAGNOSIS — R05 Cough: Secondary | ICD-10-CM | POA: Diagnosis present

## 2018-02-26 DIAGNOSIS — F17210 Nicotine dependence, cigarettes, uncomplicated: Secondary | ICD-10-CM | POA: Insufficient documentation

## 2018-02-26 DIAGNOSIS — J45909 Unspecified asthma, uncomplicated: Secondary | ICD-10-CM | POA: Diagnosis not present

## 2018-02-26 DIAGNOSIS — E039 Hypothyroidism, unspecified: Secondary | ICD-10-CM | POA: Diagnosis not present

## 2018-02-26 DIAGNOSIS — J209 Acute bronchitis, unspecified: Secondary | ICD-10-CM | POA: Insufficient documentation

## 2018-02-26 MED ORDER — PREDNISONE 50 MG PO TABS
60.0000 mg | ORAL_TABLET | Freq: Once | ORAL | Status: AC
  Administered 2018-02-26: 60 mg via ORAL
  Filled 2018-02-26: qty 1

## 2018-02-26 MED ORDER — PROMETHAZINE-CODEINE 6.25-10 MG/5ML PO SYRP: 5.0000 mL | ORAL_SOLUTION | ORAL | 0 refills | Status: DC | PRN

## 2018-02-26 MED ORDER — IPRATROPIUM-ALBUTEROL 0.5-2.5 (3) MG/3ML IN SOLN
3.0000 mL | Freq: Once | RESPIRATORY_TRACT | Status: AC
  Administered 2018-02-26: 3 mL via RESPIRATORY_TRACT
  Filled 2018-02-26: qty 3

## 2018-02-26 MED ORDER — PROMETHAZINE-CODEINE 6.25-10 MG/5ML PO SYRP
5.0000 mL | ORAL_SOLUTION | Freq: Once | ORAL | Status: AC
  Administered 2018-02-26: 5 mL via ORAL
  Filled 2018-02-26: qty 5

## 2018-02-26 MED ORDER — PREDNISONE 50 MG PO TABS: ORAL_TABLET | ORAL | 0 refills | Status: DC

## 2018-02-26 MED ORDER — ALBUTEROL SULFATE (2.5 MG/3ML) 0.083% IN NEBU
2.5000 mg | INHALATION_SOLUTION | Freq: Once | RESPIRATORY_TRACT | Status: AC
Start: 2018-02-26 — End: 2018-02-26
  Administered 2018-02-26: 2.5 mg via RESPIRATORY_TRACT
  Filled 2018-02-26: qty 3

## 2018-02-26 NOTE — Discharge Instructions (Addendum)
Take your next dose of prednisone tomorrow evening.  Do not drive within 4 hours of taking the cough syrup as it will make you sleepy.  Use your inhaler taking 2 puffs every 4 hours if needed for return of wheezing.

## 2018-02-26 NOTE — ED Triage Notes (Signed)
URI sx for several days Hx Asthma and seasonal allergies Cough runny nose Chest tight  Cig abuse 1 PPD   Has had flu shot

## 2018-02-26 NOTE — ED Notes (Signed)
Respiratory called for nebs at this time

## 2018-02-27 NOTE — ED Provider Notes (Signed)
[facility] EMERGENCY DEPARTMENT Provider Note   CSN: [id number] Arrival date & time: 02/26/18  1955     History   Chief Complaint Chief Complaint  Patient presents with  . URI    HPI Linda [name] is a 27 y.o. female with a history of asthma presenting with a 2 week history of uri type symptoms which includes nasal congestion with clear rhinorrhea, mild sore throat, low grade fever and nonproductive cough.  She was seen by her pcp and placed on augmentin for acute bronchitis and felt improved so stopped the medication 1/2 way through the course, last dose taken 5 days ago.  Over the past 24 hours she has developed increased shortness of breath, wheezing and chest pressure in association with return of subjective fever.  Symptoms do not include nausea, vomiting or diarrhea.  The patient has taken ibuprofen prior to arrival with no significant improvement in symptoms. .  The history is provided by the patient and the spouse.    Past Medical History:  Diagnosis Date  . Anxiety   . Asthma   . Heart murmur   . IBS (irritable bowel syndrome)   . Interstitial cystitis     Patient Active Problem List   Diagnosis Date Noted  . Depression with anxiety 03/28/2017  . Hypothyroidism 06/21/2014  . Interstitial cystitis 06/14/2014  . 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 Morbid obesity (HCC) 02/07/2014  . Asthma, chronic 04/15/2013  . Chronic anxiety 04/15/2013  . Cervical strain 03/07/2012  . Sprain and strain of unspecified site of shoulder and upper arm 03/07/2012  . Pain in joint, shoulder region 03/07/2012    Past Surgical History:  Procedure Laterality Date  . CESAREAN SECTION N/A 03/20/2013   Procedure: CESAREAN SECTION;  Surgeon: [name], MD;  Location: [facility];  Service: Obstetrics;  Laterality: N/A;  . CESAREAN SECTION    . MYRINGOTOMY WITH TUBE PLACEMENT    . NO PAST SURGERIES    . TYMPANOSTOMY TUBE PLACEMENT    . UPPER GI  ENDOSCOPY       OB History    Gravida  1   Para  1   Term  0   Preterm  1   AB  0   Living  1     SAB  0   TAB  0   Ectopic  0   Multiple      Live Births  1            Home Medications    Prior to Admission medications   Medication Sig Start Date End Date Taking? Authorizing Provider  albuterol (PROVENTIL HFA;VENTOLIN HFA) 108 (90 Base) MCG/ACT inhaler Inhale 2 puffs every 4 (four) hours as needed into the lungs for wheezing or shortness of breath. 09/26/17   Campbell Riches, NP  amoxicillin-clavulanate (AUGMENTIN) 875-125 MG tablet Take 1 tablet by mouth 2 (two) times daily. 02/15/18   Babs Sciara, MD  amphetamine-dextroamphetamine (ADDERALL XR) 25 MG 24 hr capsule Take 1 capsule by mouth every morning. 01/18/18   Campbell Riches, NP  citalopram (CELEXA) 40 MG tablet Take 1 tablet (40 mg total) by mouth daily. 11/22/17   Campbell Riches, NP  clonazePAM (KLONOPIN) 0.5 MG tablet Take 1 tablet (0.5 mg total) by mouth 2 (two) times daily as needed for anxiety. 11/22/17   Campbell Riches, NP  predniSONE (DELTASONE) 50 MG tablet One tablet by mouth daily x 4 days. 02/26/18  Burgess AmorIdol, Jenna Ardoin, PA-C  promethazine-codeine (PHENERGAN WITH CODEINE) 6.25-10 MG/5ML syrup Take 5 mLs by mouth every 4 (four) hours as needed for cough. 02/26/18   Burgess AmorIdol, Yides Saidi, PA-C    Family History Family History  Problem Relation Age of Onset  . Cancer Paternal Uncle   . COPD Maternal Grandfather   . Stroke Paternal Grandfather     Social History Social History   Tobacco Use  . Smoking status: Current Every Day Smoker    Packs/day: 0.50    Types: Cigarettes    Start date: 01/28/2013  . Smokeless tobacco: Never Used  Substance Use Topics  . Alcohol use: No    Alcohol/week: 0.0 oz  . Drug use: No     Allergies   Buspar [buspirone]; Ciprofloxacin; Sulfa antibiotics; Sulfa antibiotics; and Buspar [buspirone]   Review of Systems Review of Systems  Constitutional: Positive for  fever. Negative for chills.  HENT: Positive for congestion. Negative for ear pain, rhinorrhea, sinus pressure, sore throat, trouble swallowing and voice change.   Eyes: Negative for discharge.  Respiratory: Positive for cough, chest tightness, shortness of breath and wheezing. Negative for stridor.   Cardiovascular: Negative for chest pain.  Gastrointestinal: Negative for abdominal pain.  Genitourinary: Negative.      Physical Exam Updated Vital Signs BP 104/65 (BP Location: Right Arm)   Pulse (!) 114   Temp 98.5 F (36.9 C) (Oral)   Resp 20   Ht 5\' 6"  (1.676 m)   Wt 119.7 kg (264 lb)   LMP 01/26/2018   SpO2 98%   BMI 42.61 kg/m   Physical Exam  Constitutional: She is oriented to person, place, and time. She appears well-developed and well-nourished.  HENT:  Head: Normocephalic and atraumatic.  Right Ear: Tympanic membrane and ear canal normal.  Left Ear: Tympanic membrane and ear canal normal.  Nose: Rhinorrhea present. No mucosal edema.  Mouth/Throat: Uvula is midline, oropharynx is clear and moist and mucous membranes are normal. No oropharyngeal exudate, posterior oropharyngeal edema, posterior oropharyngeal erythema or tonsillar abscesses.  Eyes: Conjunctivae are normal.  Cardiovascular: Normal rate and normal heart sounds.  Pulmonary/Chest: Effort normal. No stridor. No respiratory distress. She has decreased breath sounds. She has wheezes. She has no rhonchi. She has no rales.  Expiratory wheeze all lung fields.  Prolonged expirations.  Abdominal: Soft. There is no tenderness.  Musculoskeletal: Normal range of motion.  Neurological: She is alert and oriented to person, place, and time.  Skin: Skin is warm and dry. No rash noted.  Psychiatric: She has a normal mood and affect.     ED Treatments / Results  Labs (all labs ordered are listed, but only abnormal results are displayed) Labs Reviewed - No data to display  EKG None  Radiology Dg Chest 2  View  Result Date: 02/26/2018 CLINICAL DATA:  Shortness of breath with cough and fever EXAM: CHEST - 2 VIEW COMPARISON:  November 20, 2015 FINDINGS: There is no edema or consolidation. The heart size and pulmonary vascularity are normal. No adenopathy. No bone lesions. IMPRESSION: No edema or consolidation. Electronically Signed   By: Bretta BangWilliam  Woodruff III M.D.   On: 02/26/2018 22:00    Procedures Procedures (including critical care time)  Medications Ordered in ED Medications  ipratropium-albuterol (DUONEB) 0.5-2.5 (3) MG/3ML nebulizer solution 3 mL (3 mLs Nebulization Given 02/26/18 2215)  albuterol (PROVENTIL) (2.5 MG/3ML) 0.083% nebulizer solution 2.5 mg (2.5 mg Nebulization Given 02/26/18 2214)  predniSONE (DELTASONE) tablet 60 mg (60 mg Oral Given  02/26/18 2153)  promethazine-codeine (PHENERGAN with CODEINE) 6.25-10 MG/5ML syrup 5 mL (5 mLs Oral Given 02/26/18 2321)     Initial Impression / Assessment and Plan / ED Course  I have reviewed the triage vital signs and the nursing notes.  Pertinent labs & imaging results that were available during my care of the patient were reviewed by me and considered in my medical decision making (see chart for details).     Imaging reviewed and discussed with pt. Given albuterol/atrovent neb, prednisone with complete resolution of wheezing, pt with increased dry cough after neb tx, but work of breathing resolved and she felt much improved.  She has albuterol mdi at home, advised continued use q4 hr prn, prednisone pulse dosing for home use.  Also prescribed cough syrup for sx tx.  No respiratory distress at time of dc.  Pt feels comfortable with plan. Advised f/u with pcp and/or recheck here for any worsened sx.  Final Clinical Impressions(s) / ED Diagnoses   Final diagnoses:  Acute bronchitis, unspecified organism    ED Discharge Orders        Ordered    promethazine-codeine (PHENERGAN WITH CODEINE) 6.25-10 MG/5ML syrup  Every 4 hours PRN      02/26/18 2315    predniSONE (DELTASONE) 50 MG tablet     02/26/18 2315       Burgess Amor, PA-C 02/27/18 1208    Loren Racer, MD 03/01/18 0900

## 2018-03-01 ENCOUNTER — Encounter: Payer: Self-pay | Admitting: Family Medicine

## 2018-03-01 ENCOUNTER — Encounter: Payer: Self-pay | Admitting: Nurse Practitioner

## 2018-03-01 ENCOUNTER — Ambulatory Visit (INDEPENDENT_AMBULATORY_CARE_PROVIDER_SITE_OTHER): Payer: Medicaid Other | Admitting: Nurse Practitioner

## 2018-03-01 VITALS — BP 136/82 | Temp 97.8°F | Ht 66.0 in | Wt 262.4 lb

## 2018-03-01 DIAGNOSIS — J209 Acute bronchitis, unspecified: Secondary | ICD-10-CM

## 2018-03-01 DIAGNOSIS — N912 Amenorrhea, unspecified: Secondary | ICD-10-CM | POA: Diagnosis not present

## 2018-03-01 DIAGNOSIS — J019 Acute sinusitis, unspecified: Secondary | ICD-10-CM | POA: Diagnosis not present

## 2018-03-01 DIAGNOSIS — B9689 Other specified bacterial agents as the cause of diseases classified elsewhere: Secondary | ICD-10-CM | POA: Diagnosis not present

## 2018-03-01 LAB — POCT URINE PREGNANCY: PREG TEST UR: NEGATIVE

## 2018-03-01 MED ORDER — LEVOFLOXACIN 500 MG PO TABS: 500.0000 mg | ORAL_TABLET | Freq: Every day | ORAL | 0 refills | Status: DC

## 2018-03-01 NOTE — Patient Instructions (Addendum)
Mucinex DM Reduce Celexa dose to 20 mg while on Levaquin then go back to 40 mg when finished.

## 2018-03-01 NOTE — Progress Notes (Signed)
Subjective: Presents for recheck on her bronchitis.  Was seen at local ED on 4/14.  Prescribed a short course of prednisone.  Symptoms began with fever 102-103 which has resolved.  Now having frontal/ethmoid sinus area headache.  Runny nose has improved.  Frequent cough now producing green mucus.  Tightness along the mid back area with deep breath or cough.  Minimal wheezing at this point.  No ear pain or sore throat.  Had some Augmentin left over from her visit on 4/2, only took a couple of days at that time.  Has taken this for 4 days.  Some use of her albuterol inhaler although she states this does not help much.  Uses withdrawal method for birth control.  Is due for her cycle next week.  Last intercourse was at least 2-1/2 weeks ago.  Objective:   BP 136/82   Temp 97.8 F (36.6 C) (Oral)   Ht 5\' 6"  (1.676 m)   Wt 262 lb 6.4 oz (119 kg)   BMI 42.35 kg/m  NAD.  Alert, oriented.  TMs retracted, significant sclerotic changes, no erythema.  Pharynx posterior injected with slight green PND noted.  Neck supple with mild soft anterior adenopathy.  Lungs coarse expiratory crackles heard throughout lung fields in the mid back area.  Otherwise clear.  No wheezing or tachypnea.  Heart regular rate and rhythm.  Results for orders placed or performed in visit on 03/01/18  POCT urine pregnancy  Result Value Ref Range   Preg Test, Ur Negative Negative     Assessment:  Acute bacterial rhinosinusitis - Plan: POCT urine pregnancy  Acute bronchitis, unspecified organism  Amenorrhea - Plan: POCT urine pregnancy    Plan:   Meds ordered this encounter  Medications  . levofloxacin (LEVAQUIN) 500 MG tablet    Sig: Take 1 tablet (500 mg total) by mouth daily.    Dispense:  10 tablet    Refill:  0    Order Specific Question:   Supervising Provider    Answer:   Merlyn AlbertLUKING, WILLIAM S [[id number]]   As a precaution, cut her Celexa dose to 20 mg while on Levaquin and then resume regular dose.  Mucinex DM as directed  to help with chest congestion.  Complete prednisone as directed.  Use albuterol for any wheezing as directed.  Warning signs reviewed.  Call back in 48 hours if no improvement, sooner if worse.  Patient advised not to avoid pregnancy while on Levaquin.

## 2018-03-02 ENCOUNTER — Telehealth: Payer: Self-pay | Admitting: Nurse Practitioner

## 2018-03-02 NOTE — Telephone Encounter (Signed)
Patient said [facility]. She said it takes too long to get an appointment and she would like to be referred elsewhere.

## 2018-03-03 NOTE — Telephone Encounter (Signed)
Please note message. Unfortunately, that is the norm for most of our providers.

## 2018-03-06 ENCOUNTER — Telehealth: Payer: Self-pay | Admitting: Nurse Practitioner

## 2018-03-06 NOTE — Telephone Encounter (Signed)
Is she taking Celexa 40 mg at this time? Has she taken her Klonopin to see if this will help? Any suicidal or homicidal thoughts? Any self harm?

## 2018-03-06 NOTE — Telephone Encounter (Signed)
Pt called stating that her medication is not helping her panic attacks. Pt was offered an appt with Eber Jonesarolyn for tomorrow. Pt's significant other wanted her seen today. Was told there were no available appts today. He requested to speak to Eber JonesCarolyn to see what can be done. Please advise.

## 2018-03-06 NOTE — Telephone Encounter (Signed)
Please advise 

## 2018-03-06 NOTE — Telephone Encounter (Signed)
Discussed with pt. Pt verbalized understanding. Pt transferred to the front to schedule office visit tomorrow with carolyn.

## 2018-03-06 NOTE — Telephone Encounter (Signed)
Pt states she is taking celexa 20mg  while on the antibiotic. But pt states it was not working while on 40mg  either. Tried klonopin but only helped a little. Does not feel like herself. Pt is not having any sucidial thoughts

## 2018-03-06 NOTE — Telephone Encounter (Signed)
Recommend appointment tomorrow. Seek help immediately if symptoms worsen.

## 2018-03-07 ENCOUNTER — Ambulatory Visit (INDEPENDENT_AMBULATORY_CARE_PROVIDER_SITE_OTHER): Payer: Medicaid Other | Admitting: Nurse Practitioner

## 2018-03-07 ENCOUNTER — Encounter: Payer: Self-pay | Admitting: Family Medicine

## 2018-03-07 ENCOUNTER — Encounter: Payer: Self-pay | Admitting: Nurse Practitioner

## 2018-03-07 VITALS — Ht 66.0 in | Wt 255.4 lb

## 2018-03-07 DIAGNOSIS — F418 Other specified anxiety disorders: Secondary | ICD-10-CM

## 2018-03-07 MED ORDER — ALPRAZOLAM 0.5 MG PO TABS: ORAL_TABLET | ORAL | 0 refills | Status: DC

## 2018-03-07 NOTE — Patient Instructions (Addendum)
Go back to Celexa 40 mg daily Stop Klonopin and start Xanax as directed; be careful when you start Xanax; watch for drowsiness

## 2018-03-07 NOTE — Progress Notes (Signed)
Subjective: Presents for complaints of a major flareup of her anxiety for the past few days.  Has a history of chronic anxiety since her teens, has been stable on Celexa 40 mg for a long time but states that it was not working as well over the past few months.  Denies any specific increased stress adding to her anxiety.  Has also avoided using Klonopin due to dizziness.  States she has panic attacks and severe anxiety as soon as she wakes up in the morning.  Denies suicidal or homicidal thoughts or ideation.  Several things occurred that may have contributed to her sudden increase in anxiety: She stopped smoking about 4-5 days ago, history of smoking 1 pack/day; she just went through a significant illness; her Celexa dose was temporarily decreased while she was on her antibiotic.  Patient is already made an appointment on 5/23 with mental health.  States she was diagnosed with bipolar depression and anxiety when she was 17. Depression screen PHQ 2/9 03/07/2018  Decreased Interest 3  Down, Depressed, Hopeless 2  PHQ - 2 Score 5  Altered sleeping 3  Tired, decreased energy 3  Change in appetite 2  Feeling bad or failure about yourself  2  Trouble concentrating 2  Moving slowly or fidgety/restless 1  Suicidal thoughts 0  PHQ-9 Score 18   GAD 7 : Generalized Anxiety Score 03/07/2018 11/22/2017  Nervous, Anxious, on Edge 2 2  Control/stop worrying 2 3  Worry too much - different things 2 3  Trouble relaxing 2 1  Restless 2 1  Easily annoyed or irritable 2 2  Afraid - awful might happen 2 3  Total GAD 7 Score 14 15  Anxiety Difficulty Somewhat difficult -      Objective:   Ht 5\' 6"  (1.676 m)   Wt 255 lb 6.4 oz (115.8 kg)   BMI 41.22 kg/m  NAD.  Alert, oriented.  Crying uncontrollably during most of the office visit.  Thoughts logical coherent and relevant.  Dressed appropriately.  Making good eye contact.  Patient was calmer near the end of the visit.    Assessment:   Problem List Items  Addressed This Visit      Other   Depression with anxiety - Primary   Relevant Medications   ALPRAZolam (XANAX) 0.5 MG tablet       Plan:   Meds ordered this encounter  Medications  . ALPRAZolam (XANAX) 0.5 MG tablet    Sig: Take 1/2 - 1 po BID prn anxiety    Dispense:  30 tablet    Refill:  0    Order Specific Question:   Supervising Provider    Answer:   Merlyn AlbertLUKING, WILLIAM S [[id number]]   Patient given written and verbal instructions to increase Celexa back to 40 mg today.  Secondly stop Klonopin due to dizziness, trial of Xanax as directed.  Again patient cautioned not to take medication if she becomes pregnant.  Patient agrees to seek help immediately if she has any suicidal or homicidal thoughts or ideation or intents any self-harm.  Patient given a note for last week and this week for work, she is not able to function at work at this time.  Recheck in 3 days, call or go to ED sooner if any problems.   25 minutes was spent with the patient.  This statement verifies that 25 minutes was indeed spent with the patient. Greater than half the time was spent in discussion, counseling and answering questions  regarding the issues that the patient came in for today as reflected in the diagnosis (s) please refer to documentation for further details.

## 2018-03-10 ENCOUNTER — Ambulatory Visit (INDEPENDENT_AMBULATORY_CARE_PROVIDER_SITE_OTHER): Payer: Medicaid Other | Admitting: Nurse Practitioner

## 2018-03-10 ENCOUNTER — Encounter: Payer: Self-pay | Admitting: Nurse Practitioner

## 2018-03-10 VITALS — BP 118/68 | Ht 66.0 in | Wt 255.6 lb

## 2018-03-10 DIAGNOSIS — F418 Other specified anxiety disorders: Secondary | ICD-10-CM | POA: Diagnosis not present

## 2018-03-10 MED ORDER — ALPRAZOLAM 1 MG PO TABS: 1.0000 mg | ORAL_TABLET | Freq: Two times a day (BID) | ORAL | 0 refills | Status: DC | PRN

## 2018-03-11 ENCOUNTER — Encounter: Payer: Self-pay | Admitting: Nurse Practitioner

## 2018-03-11 NOTE — Progress Notes (Signed)
Subjective:  Presents for recheck from visit 3 days ago. Anxiety is better but controlled with taking Xanax 0.5 mg 2 po BID which allows her to stay calm so she can function and take care of her child. Now noticing more depression symptoms now that anxiety is under better control. Taking Celexa 40 mg. Continues to not smoke. Denies suicidal or homicidal thoughts or ideation. Plans to return to work tomorrow since she has been told she will lose her job if she does not return.   Objective:   BP 118/68   Ht  (1.676 m)   Wt 255 lb 9.6 oz (115.9 kg)   BMI 41.25 kg/m  NAD. Alert, oriented. Lungs clear. Heart RRR. Slight tearing during visit but calm. Slightly depressed affect. Thoughts logical, coherent and relevant. Dressed appropriately. Making good eye contact.   Assessment:   Problem List Items Addressed This Visit      Other   Depression with anxiety - Primary   Relevant Medications   ALPRAZolam (XANAX) 1 MG tablet       Plan:   Meds ordered this encounter  Medications  . ALPRAZolam (XANAX) 1 MG tablet    Sig: Take 1 tablet (1 mg total) by mouth 2 (two) times daily as needed for anxiety.    Dispense:  60 tablet    Refill:  0    May fill on 03/13/18; she is taking 2 of the 0.5 mg BID until then    Order Specific Question:   Supervising Provider    Answer:   Merlyn Albert [[id number]]   Increase Xanax to 1 mg but limit to 2 per day total. Patient agrees to go to ED immediately if any suicidal thoughts. Follow up with psychiatry on 5/23 as planned. Further medications through psychiatry at that time. Call back sooner if needed.

## 2018-03-13 ENCOUNTER — Telehealth (HOSPITAL_COMMUNITY): Payer: Self-pay | Admitting: Licensed Clinical Social Worker

## 2018-03-14 ENCOUNTER — Ambulatory Visit (HOSPITAL_COMMUNITY): Payer: Self-pay

## 2018-03-15 NOTE — Telephone Encounter (Signed)
Called & explained to pt - she must call [facility] [phone] to get a list of accepting/participating providers  Pt verbalized understanding   (With her Medicaid coverage, [facility] is our only option that takes referrals from PCP)
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
## 2018-04-06 ENCOUNTER — Encounter (HOSPITAL_COMMUNITY): Payer: Self-pay | Admitting: Psychiatry

## 2018-04-06 ENCOUNTER — Ambulatory Visit (INDEPENDENT_AMBULATORY_CARE_PROVIDER_SITE_OTHER): Payer: Medicaid Other | Admitting: Psychiatry

## 2018-04-06 ENCOUNTER — Encounter (INDEPENDENT_AMBULATORY_CARE_PROVIDER_SITE_OTHER): Payer: Self-pay

## 2018-04-06 ENCOUNTER — Encounter

## 2018-04-06 VITALS — BP 105/61 | HR 70 | Ht 66.0 in | Wt 264.0 lb

## 2018-04-06 DIAGNOSIS — Z818 Family history of other mental and behavioral disorders: Secondary | ICD-10-CM

## 2018-04-06 DIAGNOSIS — Z91411 Personal history of adult psychological abuse: Secondary | ICD-10-CM | POA: Diagnosis not present

## 2018-04-06 DIAGNOSIS — F411 Generalized anxiety disorder: Secondary | ICD-10-CM | POA: Insufficient documentation

## 2018-04-06 DIAGNOSIS — Z630 Problems in relationship with spouse or partner: Secondary | ICD-10-CM | POA: Diagnosis not present

## 2018-04-06 DIAGNOSIS — G4700 Insomnia, unspecified: Secondary | ICD-10-CM

## 2018-04-06 DIAGNOSIS — Z813 Family history of other psychoactive substance abuse and dependence: Secondary | ICD-10-CM

## 2018-04-06 DIAGNOSIS — R450 Nervousness: Secondary | ICD-10-CM

## 2018-04-06 DIAGNOSIS — F0630 Mood disorder due to known physiological condition, unspecified: Secondary | ICD-10-CM | POA: Diagnosis not present

## 2018-04-06 DIAGNOSIS — F17210 Nicotine dependence, cigarettes, uncomplicated: Secondary | ICD-10-CM | POA: Diagnosis not present

## 2018-04-06 DIAGNOSIS — F410 Panic disorder [episodic paroxysmal anxiety] without agoraphobia: Secondary | ICD-10-CM | POA: Diagnosis not present

## 2018-04-06 MED ORDER — ARIPIPRAZOLE 2 MG PO TABS: 2.0000 mg | ORAL_TABLET | Freq: Every day | ORAL | 0 refills | Status: DC

## 2018-04-06 MED ORDER — ALPRAZOLAM 0.5 MG PO TABS: 0.5000 mg | ORAL_TABLET | Freq: Every evening | ORAL | 0 refills | Status: DC | PRN

## 2018-04-06 MED ORDER — CITALOPRAM HYDROBROMIDE 40 MG PO TABS: 40.0000 mg | ORAL_TABLET | Freq: Every day | ORAL | 0 refills | Status: DC

## 2018-04-06 NOTE — Patient Instructions (Signed)
1. Continue citalopram 40 mg daily  2. Start Abilify 2 mg daily  3. Return to clinic in one month for 30 mins 4. Obtain blood test (TSh, CBC, BMP)  5. Referral to therapy

## 2018-04-07 ENCOUNTER — Telehealth (HOSPITAL_COMMUNITY): Payer: Self-pay | Admitting: *Deleted

## 2018-04-07 NOTE — Telephone Encounter (Signed)
P.A. APPROVED for ARIPIPRAZOLE 2 MG TABLETS  P.A. # [id number]               EFFECTIVE 04/07/18--- 04/02/19
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
## 2018-05-09 ENCOUNTER — Encounter (HOSPITAL_COMMUNITY): Payer: Self-pay | Admitting: Psychiatry

## 2018-05-09 ENCOUNTER — Ambulatory Visit (INDEPENDENT_AMBULATORY_CARE_PROVIDER_SITE_OTHER): Payer: Medicaid Other | Admitting: Psychiatry

## 2018-05-09 VITALS — BP 115/78 | HR 75 | Ht 66.0 in | Wt 266.0 lb

## 2018-05-09 DIAGNOSIS — F0630 Mood disorder due to known physiological condition, unspecified: Secondary | ICD-10-CM

## 2018-05-09 MED ORDER — CITALOPRAM HYDROBROMIDE 40 MG PO TABS: 40.0000 mg | ORAL_TABLET | Freq: Every day | ORAL | 0 refills | Status: DC

## 2018-05-09 MED ORDER — TRAZODONE HCL 50 MG PO TABS: ORAL_TABLET | ORAL | 0 refills | Status: DC

## 2018-05-09 MED ORDER — ALPRAZOLAM 1 MG PO TABS: ORAL_TABLET | ORAL | 0 refills | Status: DC

## 2018-05-09 MED ORDER — ARIPIPRAZOLE 5 MG PO TABS: 5.0000 mg | ORAL_TABLET | Freq: Every day | ORAL | 0 refills | Status: DC

## 2018-05-09 NOTE — Patient Instructions (Signed)
1. Continue citalopram 40 mg daily  2. Start Abilify 5 mg daily  3. Decrease Xanax 0.5 mg daily as needed for anxiety  4. Start Trazodone 25-50 mg at night as needed for sleep 5 Return to clinic in one month for 30 mins 6. Obtain blood test (TSh, CBC, BMP)
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
## 2018-06-09 ENCOUNTER — Encounter (HOSPITAL_COMMUNITY): Payer: Self-pay | Admitting: Psychiatry

## 2018-06-09 ENCOUNTER — Ambulatory Visit (INDEPENDENT_AMBULATORY_CARE_PROVIDER_SITE_OTHER): Payer: Medicaid Other | Admitting: Psychiatry

## 2018-06-09 VITALS — BP 109/66 | HR 62 | Ht 66.0 in | Wt 278.0 lb

## 2018-06-09 DIAGNOSIS — F411 Generalized anxiety disorder: Secondary | ICD-10-CM | POA: Diagnosis not present

## 2018-06-09 DIAGNOSIS — F0630 Mood disorder due to known physiological condition, unspecified: Secondary | ICD-10-CM | POA: Diagnosis not present

## 2018-06-09 MED ORDER — ARIPIPRAZOLE 2 MG PO TABS: 2.0000 mg | ORAL_TABLET | Freq: Every day | ORAL | 0 refills | Status: DC

## 2018-06-09 MED ORDER — ALPRAZOLAM 1 MG PO TABS: ORAL_TABLET | ORAL | 0 refills | Status: DC

## 2018-06-09 MED ORDER — CITALOPRAM HYDROBROMIDE 40 MG PO TABS: 40.0000 mg | ORAL_TABLET | Freq: Every day | ORAL | 0 refills | Status: DC

## 2018-06-09 NOTE — Patient Instructions (Signed)
1. Continue citalopram 40 mg daily  2. Decrease Abilify 2 mg daily  3. Continue Trazodone 25-50 mg at night as needed for sleep (declined refill) 4. Obtained blood test  (TSh, CBC, BMP) - will contact [facility] 5. Return to clinic in one month for 15 mins

## 2018-06-12 ENCOUNTER — Telehealth (HOSPITAL_COMMUNITY): Payer: Self-pay | Admitting: *Deleted

## 2018-06-12 NOTE — Telephone Encounter (Signed)
Dr Vanetta ShawlHisada I called patient 1st call hung up after numerous rings 2nd call I asked t speak with Linda [name] she stated this is me  I stated my name & that I"m calling from Dr Vanetta ShawlHisada office concerning your blood work from [facility]. 3rd call NO ANSWER

## 2018-06-12 NOTE — Telephone Encounter (Signed)
Left out on 2nd call after stating my name & provider concerning blood work from [facility] patient hung up on me

## 2018-06-22 ENCOUNTER — Ambulatory Visit (HOSPITAL_COMMUNITY): Payer: Medicaid Other | Admitting: Psychiatry

## 2018-07-07 ENCOUNTER — Other Ambulatory Visit (HOSPITAL_COMMUNITY): Payer: Self-pay | Admitting: Psychiatry

## 2018-07-07 ENCOUNTER — Telehealth (HOSPITAL_COMMUNITY): Payer: Self-pay | Admitting: *Deleted

## 2018-07-07 MED ORDER — CITALOPRAM HYDROBROMIDE 40 MG PO TABS: 40.0000 mg | ORAL_TABLET | Freq: Every day | ORAL | 0 refills | Status: DC

## 2018-07-07 MED ORDER — ALPRAZOLAM 1 MG PO TABS: ORAL_TABLET | ORAL | 0 refills | Status: DC

## 2018-07-07 MED ORDER — ARIPIPRAZOLE 2 MG PO TABS: 2.0000 mg | ORAL_TABLET | Freq: Every day | ORAL | 0 refills | Status: DC

## 2018-07-07 NOTE — Telephone Encounter (Signed)
ordered

## 2018-07-07 NOTE — Telephone Encounter (Signed)
Dr Vanetta ShawlHisada Patient called requesting refills on med's. Next office visit is 07/13/18 & she will be out of med;s on 07/09/18. Per RX there is nothing on hold . Last pick up was 06/09/18
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
## 2018-07-13 ENCOUNTER — Ambulatory Visit (HOSPITAL_COMMUNITY): Payer: Self-pay | Admitting: Psychiatry

## 2018-07-13 ENCOUNTER — Ambulatory Visit (HOSPITAL_COMMUNITY): Payer: Medicaid Other | Admitting: Psychiatry
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
## 2018-07-26 ENCOUNTER — Ambulatory Visit (INDEPENDENT_AMBULATORY_CARE_PROVIDER_SITE_OTHER): Payer: Medicaid Other | Admitting: Psychiatry

## 2018-07-26 ENCOUNTER — Encounter (HOSPITAL_COMMUNITY): Payer: Self-pay | Admitting: Psychiatry

## 2018-07-26 VITALS — BP 121/81 | HR 94 | Ht 66.0 in | Wt 275.0 lb

## 2018-07-26 DIAGNOSIS — F410 Panic disorder [episodic paroxysmal anxiety] without agoraphobia: Secondary | ICD-10-CM

## 2018-07-26 DIAGNOSIS — F0630 Mood disorder due to known physiological condition, unspecified: Secondary | ICD-10-CM

## 2018-07-26 DIAGNOSIS — F411 Generalized anxiety disorder: Secondary | ICD-10-CM | POA: Diagnosis not present

## 2018-07-26 DIAGNOSIS — Z818 Family history of other mental and behavioral disorders: Secondary | ICD-10-CM

## 2018-07-26 DIAGNOSIS — Z813 Family history of other psychoactive substance abuse and dependence: Secondary | ICD-10-CM

## 2018-07-26 DIAGNOSIS — F17210 Nicotine dependence, cigarettes, uncomplicated: Secondary | ICD-10-CM

## 2018-07-26 MED ORDER — CITALOPRAM HYDROBROMIDE 40 MG PO TABS: 40.0000 mg | ORAL_TABLET | Freq: Every day | ORAL | 0 refills | Status: DC

## 2018-07-26 MED ORDER — ALPRAZOLAM 1 MG PO TABS: ORAL_TABLET | ORAL | 2 refills | Status: DC

## 2018-07-26 MED ORDER — BUPROPION HCL ER (XL) 150 MG PO TB24: 150.0000 mg | ORAL_TABLET | Freq: Every day | ORAL | 0 refills | Status: DC

## 2018-07-26 MED ORDER — ARIPIPRAZOLE 2 MG PO TABS: 2.0000 mg | ORAL_TABLET | Freq: Every day | ORAL | 0 refills | Status: DC

## 2018-07-26 NOTE — Patient Instructions (Addendum)
1. Continue citalopram 40 mg daily 2. Start Wellbutrin 150 mg daily 2. Continue Abilify 2 mg daily 3. Continue Xanax 1 mg daily as needed for anxiety 4. Continue Trazodone 25-50 mg at night as needed for sleep  5. Obtained blood test  (TSH, CBC, BMP) 6. Return to clinic in three months for 15 mins

## 2018-07-27 ENCOUNTER — Encounter (HOSPITAL_COMMUNITY): Payer: Self-pay | Admitting: Psychiatry

## 2018-07-27 ENCOUNTER — Telehealth (HOSPITAL_COMMUNITY): Payer: Self-pay | Admitting: Psychiatry

## 2018-07-27 LAB — CBC
HEMATOCRIT: 37.4 % (ref 35.0–45.0)
Hemoglobin: 12.4 g/dL (ref 11.7–15.5)
MCH: 28.7 pg (ref 27.0–33.0)
MCHC: 33.2 g/dL (ref 32.0–36.0)
MCV: 86.6 fL (ref 80.0–100.0)
MPV: 10.3 fL (ref 7.5–12.5)
Platelets: 317 10*3/uL (ref 140–400)
RBC: 4.32 10*6/uL (ref 3.80–5.10)
RDW: 13.1 % (ref 11.0–15.0)
WBC: 10.3 10*3/uL (ref 3.8–10.8)

## 2018-07-27 LAB — BASIC METABOLIC PANEL
BUN: 9 mg/dL (ref 7–25)
CALCIUM: 9.7 mg/dL (ref 8.6–10.2)
CHLORIDE: 103 mmol/L (ref 98–110)
CO2: 25 mmol/L (ref 20–32)
Creat: 0.85 mg/dL (ref 0.50–1.10)
Glucose, Bld: 82 mg/dL (ref 65–139)
POTASSIUM: 4 mmol/L (ref 3.5–5.3)
Sodium: 137 mmol/L (ref 135–146)

## 2018-07-27 LAB — TSH: TSH: 28.86 mIU/L — ABNORMAL HIGH

## 2018-07-27 NOTE — Telephone Encounter (Signed)
Could you contact the patient. Her thyroid test (TSH) was above normal range. I would recommend her to see her primary care doctor for further evaluation. We will mail the test result.

## 2018-07-27 NOTE — Telephone Encounter (Signed)
Lvm per provider :  Her thyroid test (TSH) was above normal range. I would recommend her to see her primary care doctor for further evaluation. We will mail the test result.

## 2018-07-31 ENCOUNTER — Telehealth: Payer: Self-pay | Admitting: Family Medicine

## 2018-07-31 NOTE — Telephone Encounter (Signed)
Patient advised and scheduled follow up office visit to discuss blood work this week with Dr Brett CanalesSteve.

## 2018-07-31 NOTE — Telephone Encounter (Signed)
I reviewed b w, pt is seriously hypothryoid and needs intervention quickly, rec at least first visit with me, will take too long to get in to see endocrinologist

## 2018-07-31 NOTE — Telephone Encounter (Signed)
[facility] at [facility] and they wanted her to be referred by her primary care doctor to an endocrinologist. Patient wanted to know if she needed appointment here first before she can get referred out.

## 2018-08-02 ENCOUNTER — Encounter: Payer: Self-pay | Admitting: Family Medicine

## 2018-08-02 ENCOUNTER — Ambulatory Visit: Payer: Medicaid Other | Admitting: Family Medicine

## 2018-08-02 VITALS — BP 112/72 | Ht 66.0 in | Wt 276.4 lb

## 2018-08-02 DIAGNOSIS — E039 Hypothyroidism, unspecified: Secondary | ICD-10-CM

## 2018-08-02 MED ORDER — LEVOTHYROXINE SODIUM 75 MCG PO TABS: 75.0000 ug | ORAL_TABLET | Freq: Every day | ORAL | 2 refills | Status: DC

## 2018-08-02 NOTE — Progress Notes (Signed)
   Subjective:    Patient ID: Linda [name], female    DOB: Apr 05, 1991, 27 y.o.   MRN: [id number]  HPI Pt here today to discuss blood work she had completed through [facility]. Pt has message in chart regarding referral to endocrinologist.  Patient has history of hypothyroidism during pregnancy.  TSH in recent years has been in good control.  Patient psychiatrist did another TSH.  Very elevated at 28  On further history of progressive fatigue.  Some constipation.  Diminished energy.  Mood not as good as usual   Review of Systems No headache, no major weight loss or weight gain, no chest pain no back pain abdominal pain no change in bowel habits complete ROS otherwise negative     Objective:   Physical Exam Alert and oriented, vitals reviewed and stable, NAD ENT-TM's and ext canals WNL bilat via otoscopic exam Soft palate, tonsils and post pharynx WNL via oropharyngeal exam Neck-symmetric, no masses; thyroid nonpalpable and nontender Pulmonary-no tachypnea or accessory muscle use; Clear without wheezes via auscultation Card--no abnrml murmurs, rhythm reg and rate WNL Carotid pulses symmetric, without bruits  Alert vitals stable, NAD. Blood pressure good on repeat. HEENT normal. Lungs clear. Heart regular rate and rhythm. Thyroid nonpalpable.  Obesity of neck noted      Assessment & Plan:  Impression hypothyroidism.  TSH 28.  Patient would like to see endocrinologist, we will honor this.  Initiate 75 mg levothyroid daily.  Proper use discussed.  Multiple questions answered regarding hypothyroidism potential etiology, potential treatment interventions.  Greater than 50% of this 25 minute face to face visit was spent in counseling and discussion and coordination of care regarding the above diagnosis/diagnosies

## 2018-08-04 ENCOUNTER — Encounter: Payer: Self-pay | Admitting: Family Medicine

## 2018-09-18 ENCOUNTER — Telehealth: Payer: Self-pay | Admitting: Family Medicine

## 2018-09-18 DIAGNOSIS — R221 Localized swelling, mass and lump, neck: Secondary | ICD-10-CM | POA: Diagnosis not present

## 2018-09-18 DIAGNOSIS — Z23 Encounter for immunization: Secondary | ICD-10-CM | POA: Diagnosis not present

## 2018-09-18 DIAGNOSIS — R1310 Dysphagia, unspecified: Secondary | ICD-10-CM | POA: Diagnosis not present

## 2018-09-18 DIAGNOSIS — E039 Hypothyroidism, unspecified: Secondary | ICD-10-CM | POA: Diagnosis not present

## 2018-09-18 MED ORDER — ALBUTEROL SULFATE HFA 108 (90 BASE) MCG/ACT IN AERS: 2.0000 | INHALATION_SPRAY | RESPIRATORY_TRACT | 1 refills | Status: DC | PRN

## 2018-09-18 NOTE — Telephone Encounter (Signed)
Patient is aware we have sent in the medication to the requested pharmacy.

## 2018-09-18 NOTE — Telephone Encounter (Signed)
Patient is requesting refill on albuterol inhaler to be called into [place].
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
## 2018-10-24 ENCOUNTER — Encounter: Payer: Self-pay | Admitting: Family Medicine

## 2018-10-24 ENCOUNTER — Ambulatory Visit: Payer: Medicaid Other | Admitting: Family Medicine

## 2018-10-24 VITALS — BP 118/70 | Temp 98.3°F | Ht 66.0 in | Wt 282.2 lb

## 2018-10-24 DIAGNOSIS — B9689 Other specified bacterial agents as the cause of diseases classified elsewhere: Secondary | ICD-10-CM | POA: Diagnosis not present

## 2018-10-24 DIAGNOSIS — J019 Acute sinusitis, unspecified: Secondary | ICD-10-CM

## 2018-10-24 MED ORDER — PREDNISONE 20 MG PO TABS: ORAL_TABLET | ORAL | 0 refills | Status: DC

## 2018-10-24 MED ORDER — DOXYCYCLINE HYCLATE 100 MG PO TABS: 100.0000 mg | ORAL_TABLET | Freq: Two times a day (BID) | ORAL | 0 refills | Status: DC

## 2018-10-24 NOTE — Progress Notes (Signed)
   Subjective:    Patient ID: Linda [name] PersonLisabeth D Slivka, female    DOB: Oct 27, 1991, 27 y.o.   MRN: [id number]  Cough  This is a new problem. Episode onset: one month. Associated symptoms include a fever, headaches, shortness of breath and wheezing. Pertinent negatives include no ear pain or sore throat. Associated symptoms comments: Low grade fever 100.0. Treatments tried: mucinex, ibuprofen.   Reports 1 month ago started with nasal congestion. Reports 2.5 weeks ago moved to chest congestion, cough productive of yellow/brown mucous. Low grade fever intermittently the last 3 days. Last fever last night. Highest fever of 100.8. Reports shortness of breath. Reports difficulty sleeping d/t cough.   Current smoker.   Review of Systems  Constitutional: Positive for fever.  HENT: Positive for congestion. Negative for ear pain and sore throat.   Respiratory: Positive for cough, shortness of breath and wheezing.   Gastrointestinal: Negative for diarrhea, nausea and vomiting.  Neurological: Positive for headaches.       Objective:   Physical Exam  Constitutional: She is oriented to person, place, and time. She appears well-developed and well-nourished. No distress.  HENT:  Head: Normocephalic and atraumatic.  Right Ear: Tympanic membrane normal.  Left Ear: Tympanic membrane normal.  Nose: Nose normal.  Mouth/Throat: Oropharynx is clear and moist.  Eyes: Right eye exhibits no discharge. Left eye exhibits no discharge.  Neck: Neck supple.  Cardiovascular: Normal rate, regular rhythm and normal heart sounds.  Pulmonary/Chest: Effort normal. No respiratory distress. She has wheezes (scattered expiratory wheeze). She has rhonchi.  Lymphadenopathy:    She has no cervical adenopathy.  Neurological: She is alert and oriented to person, place, and time.  Skin: Skin is warm and dry.  Nursing note and vitals reviewed.     Assessment & Plan:  Acute bacterial rhinosinusitis Likely post viral etiology.  Do  not feel patient has pneumonia at this time.  Likely bronchitis element.  Given duration of symptoms we will go ahead and treat with an antibiotic, and a prednisone taper given patient's wheezing and rhonchi.  Encouraged regular use of her albuterol inhaler.  Warning signs discussed, follow-up if symptoms worsen or fail to improve.  Dr. Lubertha SouthSteve Luking was consulted on this case, he also examine the patient, and is in agreement with the above treatment plan.

## 2018-10-25 ENCOUNTER — Ambulatory Visit (HOSPITAL_COMMUNITY): Payer: Medicaid Other | Admitting: Psychiatry

## 2018-11-27 ENCOUNTER — Encounter: Payer: Self-pay | Admitting: Family Medicine

## 2018-11-27 ENCOUNTER — Ambulatory Visit: Payer: Medicaid Other | Admitting: Family Medicine

## 2018-11-27 VITALS — Temp 97.8°F | Wt 281.6 lb

## 2018-11-27 DIAGNOSIS — J4520 Mild intermittent asthma, uncomplicated: Secondary | ICD-10-CM

## 2018-11-27 DIAGNOSIS — J019 Acute sinusitis, unspecified: Secondary | ICD-10-CM

## 2018-11-27 MED ORDER — AMOXICILLIN-POT CLAVULANATE 875-125 MG PO TABS: ORAL_TABLET | ORAL | 0 refills | Status: DC

## 2018-11-27 MED ORDER — PREDNISONE 20 MG PO TABS: ORAL_TABLET | ORAL | 0 refills | Status: DC

## 2018-11-27 NOTE — Progress Notes (Signed)
   Subjective:    Patient ID: Linda [name], female    DOB: July 16, 1991, 28 y.o.   MRN: [id number]  Cough  This is a new problem. The current episode started 1 to 4 weeks ago. The cough is productive of sputum. Associated symptoms include wheezing. Associated symptoms comments: Pt states top of back burns and aches.. Treatments tried: Ibuprofen  The treatment provided no relief.    Two weeks ago statrted as a dry cough  Burning and aching upper mid back     coughing up gumkiness   Pos wheezing at bed does not cough much at night     Soles one hlf to three qrarter   Review of Systems  Respiratory: Positive for cough and wheezing.        Objective:   Physical Exam  Alert, mild malaise. Hydration good Vitals stable.  positive nasal congestion. pharynx normal neck supple  lungs clear/no crackles however positive for wheezes. heart regular in rhythm       Assessment & Plan:  Impression rhinosinusitis/bronchitis with reactive airways.  Likely post viral, discussed with patient. plan antibiotics prescribed. Questions answered. Symptomatic care discussed. warning signs discussed. WSL Upper mid back pain is likely trapezius strain secondary to coughing.  Discussed.  Should improve.  Smoking cessation discussed.  Encouraged to use nicotine patches

## 2018-11-30 ENCOUNTER — Other Ambulatory Visit: Payer: Self-pay

## 2018-11-30 ENCOUNTER — Encounter (HOSPITAL_COMMUNITY): Payer: Self-pay | Admitting: Emergency Medicine

## 2018-11-30 ENCOUNTER — Emergency Department (HOSPITAL_COMMUNITY)
Admission: EM | Admit: 2018-11-30 | Discharge: 2018-12-01 | Disposition: A | Discharge status: Home / Self Care | Payer: Medicaid Other | Attending: Emergency Medicine | Admitting: Emergency Medicine

## 2018-11-30 DIAGNOSIS — F17210 Nicotine dependence, cigarettes, uncomplicated: Secondary | ICD-10-CM | POA: Insufficient documentation

## 2018-11-30 DIAGNOSIS — F909 Attention-deficit hyperactivity disorder, unspecified type: Secondary | ICD-10-CM | POA: Diagnosis not present

## 2018-11-30 DIAGNOSIS — Y9389 Activity, other specified: Secondary | ICD-10-CM | POA: Insufficient documentation

## 2018-11-30 DIAGNOSIS — Y929 Unspecified place or not applicable: Secondary | ICD-10-CM | POA: Insufficient documentation

## 2018-11-30 DIAGNOSIS — Z79899 Other long term (current) drug therapy: Secondary | ICD-10-CM | POA: Insufficient documentation

## 2018-11-30 DIAGNOSIS — T180XXA Foreign body in mouth, initial encounter: Secondary | ICD-10-CM | POA: Diagnosis not present

## 2018-11-30 DIAGNOSIS — J45909 Unspecified asthma, uncomplicated: Secondary | ICD-10-CM | POA: Insufficient documentation

## 2018-11-30 DIAGNOSIS — X58XXXA Exposure to other specified factors, initial encounter: Secondary | ICD-10-CM | POA: Diagnosis not present

## 2018-11-30 DIAGNOSIS — Y998 Other external cause status: Secondary | ICD-10-CM | POA: Insufficient documentation

## 2018-11-30 DIAGNOSIS — F419 Anxiety disorder, unspecified: Secondary | ICD-10-CM | POA: Diagnosis not present

## 2018-11-30 DIAGNOSIS — E039 Hypothyroidism, unspecified: Secondary | ICD-10-CM | POA: Diagnosis not present

## 2018-11-30 NOTE — ED Triage Notes (Signed)
Pt has had piercing under tongue x 5 years. Woke up this am with pain to site and unable to get piercing out. Swelling around piercing noted and swelling with mild redness noted to back of throat. Pt denies sore throat.

## 2018-11-30 NOTE — ED Provider Notes (Signed)
[facility] EMERGENCY DEPARTMENT Provider Note   CSN: [id number] Arrival date & time: 11/30/18  2309     History   Chief Complaint Chief Complaint  Patient presents with  . Oral Swelling    HPI Linda [name] is a 28 y.o. female who presents to the ED with c/o pain under her tongue where where she has a piercing x 4 years. Patient reports she is unable to get the ring out of the area under her tongue. She reports swelling around the piercing. She denies sore throat, fever or other problems.. Symptoms started today.   HPI  Past Medical History:  Diagnosis Date  . Anxiety   . Asthma   . Heart murmur   . IBS (irritable bowel syndrome)   . Interstitial cystitis     Patient Active Problem List   Diagnosis Date Noted  . Mood disorder in conditions classified elsewhere 04/06/2018  . Generalized anxiety disorder 04/06/2018  . Hypothyroidism 06/21/2014  . Interstitial cystitis 06/14/2014  . 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 Morbid obesity (HCC) 02/07/2014  . Asthma, chronic 04/15/2013  . Chronic anxiety 04/15/2013  . Cervical strain 03/07/2012  . Sprain and strain of unspecified site of shoulder and upper arm 03/07/2012  . Pain in joint, shoulder region 03/07/2012    Past Surgical History:  Procedure Laterality Date  . CESAREAN SECTION N/A 03/20/2013   Procedure: CESAREAN SECTION;  Surgeon: Oliver PilaKathy W Richardson, MD;  Location: [facility];  Service: Obstetrics;  Laterality: N/A;  . CESAREAN SECTION    . MYRINGOTOMY WITH TUBE PLACEMENT    . NO PAST SURGERIES    . TYMPANOSTOMY TUBE PLACEMENT    . UPPER GI ENDOSCOPY       OB History    Gravida  1   Para  1   Term  0   Preterm  1   AB  0   Living  1     SAB  0   TAB  0   Ectopic  0   Multiple      Live Births  1            Home Medications    Prior to Admission medications   Medication Sig Start Date End Date Taking? Authorizing Provider  albuterol (PROVENTIL  HFA;VENTOLIN HFA) 108 (90 Base) MCG/ACT inhaler Inhale 2 puffs into the lungs every 4 (four) hours as needed for wheezing or shortness of breath. 09/18/18   Merlyn AlbertLuking, William S, MD  ALPRAZolam Prudy Feeler(XANAX) 1 MG tablet 0.5-1 mg daily as needed for anxiety 08/09/18   Neysa HotterHisada, Reina, MD  amoxicillin-clavulanate (AUGMENTIN) 875-125 MG tablet One bid for ten d 11/27/18   Merlyn AlbertLuking, William S, MD  amphetamine-dextroamphetamine (ADDERALL XR) 25 MG 24 hr capsule Take 1 capsule by mouth every morning. Patient not taking: Reported on 08/02/2018 01/18/18   Campbell RichesHoskins, Carolyn C, NP  ARIPiprazole (ABILIFY) 2 MG tablet Take 1 tablet (2 mg total) by mouth daily. Patient not taking: Reported on 10/24/2018 07/26/18   Neysa HotterHisada, Reina, MD  buPROPion (WELLBUTRIN XL) 150 MG 24 hr tablet Take 1 tablet (150 mg total) by mouth daily. Patient not taking: Reported on 10/24/2018 07/26/18   Neysa HotterHisada, Reina, MD  citalopram (CELEXA) 40 MG tablet Take 1 tablet (40 mg total) by mouth daily. 11/22/17   Campbell RichesHoskins, Carolyn C, NP  citalopram (CELEXA) 40 MG tablet Take 1 tablet (40 mg total) by mouth daily. 07/26/18   Neysa HotterHisada, Reina, MD  doxycycline (VIBRA-TABS) 100 MG  tablet Take 1 tablet (100 mg total) by mouth 2 (two) times daily. Take with a snack and tall glass of water. 10/24/18   Jeannine Boga, NP  levothyroxine (SYNTHROID, LEVOTHROID) 75 MCG tablet Take 1 tablet (75 mcg total) by mouth daily. 08/02/18   Merlyn Albert, MD  predniSONE (DELTASONE) 20 MG tablet Take 3 qd for 3 days, then 2 qd for 3 days, then one qd for 3 days 10/24/18   Jeannine Boga, NP  predniSONE (DELTASONE) 20 MG tablet Three q d for three d, two qd for three d, one q for two d 11/27/18   Merlyn Albert, MD  traZODone (DESYREL) 50 MG tablet 25-50 mg at night as needed for sleep Patient not taking: Reported on 10/24/2018 05/09/18   Neysa Hotter, MD    Family History Family History  Problem Relation Age of Onset  . Cancer Paternal Uncle   . COPD Maternal Grandfather   .  Stroke Paternal Grandfather   . Depression Mother   . OCD Mother   . Bipolar disorder Paternal Aunt   . Drug abuse Maternal Uncle   . Depression Maternal Grandmother   . Anxiety disorder Maternal Grandmother     Social History Social History   Tobacco Use  . Smoking status: Current Every Day Smoker    Packs/day: 0.50    Types: Cigarettes    Start date: 01/28/2013  . Smokeless tobacco: Never Used  Substance Use Topics  . Alcohol use: No    Alcohol/week: 0.0 standard drinks  . Drug use: No     Allergies   Buspar [buspirone]; Ciprofloxacin; Sulfa antibiotics; Sulfa antibiotics; and Buspar [buspirone]   Review of Systems Review of Systems  HENT:       Mouth pain.  All other systems reviewed and are negative.    Physical Exam Updated Vital Signs BP 127/61   Pulse 80   Temp 97.6 F (36.4 C) (Oral)   Resp 17   Ht 5\' 4"  (1.626 m)   Wt 90.7 kg   LMP 11/09/2018   SpO2 97%   BMI 34.33 kg/m   Physical Exam Vitals signs and nursing note reviewed.  Constitutional:      General: She is not in acute distress.    Appearance: She is well-developed.  HENT:     Head: Normocephalic.     Mouth/Throat:     Comments: There is a piercing under the tongue at the sublingual papilla. There is minimal edema, no drainage, no bleeding.  Neck:     Musculoskeletal: Neck supple.  Pulmonary:     Effort: Pulmonary effort is normal.  Abdominal:     Palpations: Abdomen is soft.     Tenderness: There is no abdominal tenderness.  Musculoskeletal: Normal range of motion.  Lymphadenopathy:     Cervical: No cervical adenopathy.  Skin:    General: Skin is warm and dry.  Neurological:     Mental Status: She is alert and oriented to person, place, and time.     Cranial Nerves: No cranial nerve deficit.      ED Treatments / Results  Labs (all labs ordered are listed, but only abnormal results are displayed) Labs Reviewed - No data to display  Radiology No results  found.  Procedures .Foreign Body Removal Date/Time: 12/01/2018 12:09 AM Performed by: Janne Napoleon, NP Authorized by: Janne Napoleon, NP  Consent: Verbal consent obtained. Consent given by: patient Patient understanding: patient states understanding of the procedure being performed Required  items: required blood products, implants, devices, and special equipment available Body area: mucosa (mouth under tongue)  Sedation: Patient sedated: no  Patient restrained: no Patient cooperative: yes Removal mechanism: hemostat Complexity: simple 1 objects recovered. Objects recovered: tongue ring Post-procedure assessment: foreign body removed Patient tolerance: Patient tolerated the procedure well with no immediate complications Comments: Using hemostats I grasp both sides of the ring and unscrewed the right ball from the piercing and removed the piercing.    (including critical care time)  Medications Ordered in ED Medications - No data to display   Initial Impression / Assessment and Plan / ED Course  I have reviewed the triage vital signs and the nursing notes. 28 y.o. female here for removal of tongue ring stable for d/c without fever or signs of infection. Return precautions discussed.   Final Clinical Impressions(s) / ED Diagnoses   Final diagnoses:  Foreign body in mouth, initial encounter    ED Discharge Orders    None       Kerrie Buffaloeese, Hope CavourM, [place]NP 12/01/18 [id number]    Devoria AlbeKnapp, Iva, MD 12/01/18 0800

## 2018-12-01 NOTE — Discharge Instructions (Addendum)
I have removed the ring from under your tongue. Rinse with warm salt water and follow up with your doctor. Return for any signs of infection.

## 2018-12-06 NOTE — Progress Notes (Signed)
[facility] MD/PA/NP OP Progress Note  12/07/2018 12:14 PM Linda [name]  MRN:  [id number]  Chief Complaint:  Chief Complaint    Depression; Follow-up     HPI:  Patient presents for follow-up appointment for depression and anxiety.  She is has been treated for hypothyroidism.  She weaned off Abilify and did not try Wellbutrin as she wanted to see if her mood was due to hypothyroidism.  She believes she has been doing well since the last visit.  She feels anxious about upcoming relocation.  She and her fianc, her son decided to move to place where it is more affordable given their roommate moved out.  She looks forward to this move.  She reports good relationship with her fianc and her son.  She is thinking of starting online school to find a different job.  She wants to try stimulant so that she can focus better.  She has difficulty in concentration, completing tasks, and doing multitasking.  She is very forgetful and misplaces things.  She was diagnosed with ADHD at age 75.  She has fair sleep.  She denies feeling depressed.  She denies SI.  She denies panic attacks.  She takes Xanax occasionally for anxiety.  She denies decreased need for sleep or euphoria.    Wt Readings from Last 3 Encounters:  12/07/18 282 lb 9.6 oz (128.2 kg)  11/30/18 200 lb (90.7 kg)  11/27/18 281 lb 9.6 oz (127.7 kg)     Visit Diagnosis:    ICD-10-CM   1. Generalized anxiety disorder F41.1   2. ADD (attention deficit disorder) without hyperactivity F98.8     Past Psychiatric History: Please see initial evaluation for full details. I have reviewed the history. No updates at this time.     Past Medical History:  Past Medical History:  Diagnosis Date  . Anxiety   . Asthma   . Heart murmur   . IBS (irritable bowel syndrome)   . Interstitial cystitis     Past Surgical History:  Procedure Laterality Date  . CESAREAN SECTION N/A 03/20/2013   Procedure: CESAREAN SECTION;  Surgeon: [name], MD;   Location: [facility];  Service: Obstetrics;  Laterality: N/A;  . CESAREAN SECTION    . MYRINGOTOMY WITH TUBE PLACEMENT    . NO PAST SURGERIES    . TYMPANOSTOMY TUBE PLACEMENT    . UPPER GI ENDOSCOPY      Family Psychiatric History: Please see initial evaluation for full details. I have reviewed the history. No updates at this time.     Family History:  Family History  Problem Relation Age of Onset  . Cancer Paternal Uncle   . COPD Maternal Grandfather   . Stroke Paternal Grandfather   . Depression Mother   . OCD Mother   . Bipolar disorder Paternal Aunt   . Drug abuse Maternal Uncle   . Depression Maternal Grandmother   . Anxiety disorder Maternal Grandmother     Social History:  Social History   Socioeconomic History  . Marital status: Single    Spouse name: Not on file  . Number of children: Not on file  . Years of education: Not on file  . Highest education level: Not on file  Occupational History  . Not on file  Social Needs  . Financial resource strain: Not on file  . Food insecurity:    Worry: Not on file    Inability: Not on file  . Transportation needs:    Medical:  Not on file    Non-medical: Not on file  Tobacco Use  . Smoking status: Current Every Day Smoker    Packs/day: 0.50    Types: Cigarettes    Start date: 01/28/2013  . Smokeless tobacco: Never Used  Substance and Sexual Activity  . Alcohol use: No    Alcohol/week: 0.0 standard drinks  . Drug use: No  . Sexual activity: Yes    Partners: Male    Birth control/protection: None  Lifestyle  . Physical activity:    Days per week: Not on file    Minutes per session: Not on file  . Stress: Not on file  Relationships  . Social connections:    Talks on phone: Not on file    Gets together: Not on file    Attends religious service: Not on file    Active member of club or organization: Not on file    Attends meetings of clubs or organizations: Not on file    Relationship status: Not on file  Other  Topics Concern  . Not on file  Social History Narrative   ** Merged History Encounter **        Allergies:  Allergies  Allergen Reactions  . Buspar [Buspirone] Other (See Comments)    Increased anxiety symptoms  . Ciprofloxacin Nausea Only  . Sulfa Antibiotics Diarrhea and Nausea Only  . Sulfa Antibiotics Nausea And Vomiting  . Buspar [Buspirone] Anxiety    Metabolic Disorder Labs: Lab Results  Component Value Date   HGBA1C 5.0 11/24/2016   No results found for: PROLACTIN No results found for: CHOL, TRIG, HDL, CHOLHDL, VLDL, LDLCALC Lab Results  Component Value Date   TSH 28.86 (H) 07/26/2018   TSH 4.370 11/24/2016    Therapeutic Level Labs: No results found for: LITHIUM No results found for: VALPROATE No components found for:  CBMZ  Current Medications: Current Outpatient Medications  Medication Sig Dispense Refill  . albuterol (PROVENTIL HFA;VENTOLIN HFA) 108 (90 Base) MCG/ACT inhaler Inhale 2 puffs into the lungs every 4 (four) hours as needed for wheezing or shortness of breath. 1 Inhaler 1  . ALPRAZolam (XANAX) 1 MG tablet 0.5-1 mg daily as needed for anxiety 30 tablet 0  . amoxicillin-clavulanate (AUGMENTIN) 875-125 MG tablet One bid for ten d 20 tablet 0  . citalopram (CELEXA) 40 MG tablet Take 1 tablet (40 mg total) by mouth daily. 90 tablet 1  . citalopram (CELEXA) 40 MG tablet Take 1 tablet (40 mg total) by mouth daily. 90 tablet 0  . doxycycline (VIBRA-TABS) 100 MG tablet Take 1 tablet (100 mg total) by mouth 2 (two) times daily. Take with a snack and tall glass of water. 20 tablet 0  . levothyroxine (SYNTHROID, LEVOTHROID) 75 MCG tablet Take 1 tablet (75 mcg total) by mouth daily. 30 tablet 2  . lisdexamfetamine (VYVANSE) 20 MG capsule Take 1 capsule (20 mg total) by mouth daily. 30 capsule 0  . predniSONE (DELTASONE) 20 MG tablet Take 3 qd for 3 days, then 2 qd for 3 days, then one qd for 3 days 18 tablet 0  . predniSONE (DELTASONE) 20 MG tablet Three q d  for three d, two qd for three d, one q for two d 17 tablet 0  . traZODone (DESYREL) 50 MG tablet 25-50 mg at night as needed for sleep (Patient not taking: Reported on 10/24/2018) 30 tablet 0   No current facility-administered medications for this visit.      Musculoskeletal: Strength & Muscle  Tone: within normal limits Gait & Station: normal Patient leans: N/A  Psychiatric Specialty Exam: Review of Systems  Psychiatric/Behavioral: Negative for depression, hallucinations, memory loss, substance abuse and suicidal ideas. The patient is nervous/anxious. The patient does not have insomnia.   All other systems reviewed and are negative.   Blood pressure (!) 178/66, pulse 85, height 5\' 4"  (1.626 m), weight 282 lb 9.6 oz (128.2 kg), last menstrual period 11/09/2018, SpO2 97 %.Body mass index is 48.51 kg/m.  General Appearance: Fairly Groomed  Eye Contact:  Good  Speech:  Clear and Coherent  Volume:  Normal  Mood:  "fine"  Affect:  Appropriate, Congruent and calm  Thought Process:  Coherent  Orientation:  Full (Time, Place, and Person)  Thought Content: Logical   Suicidal Thoughts:  No  Homicidal Thoughts:  No  Memory:  Immediate;   Good  Judgement:  Good  Insight:  Fair  Psychomotor Activity:  Normal  Concentration:  Concentration: Good and Attention Span: Good  Recall:  Good  Fund of Knowledge: Good  Language: Good  Akathisia:  No  Handed:  Right  AIMS (if indicated): not done  Assets:  Communication Skills Desire for Improvement  ADL's:  Intact  Cognition: WNL  Sleep:  Fair   Screenings: GAD-7     [facility] Visit [facility]/07/2018 in [facility] Visit [facility]/2017 in [facility]  Total GAD-7 Score  14  15    PHQ2-9     Office Visit from 03/07/2018 in [facility]  PHQ-2 Total Score  5  PHQ-9 Total Score  18       Assessment and Plan:  [name] is a 28 y.o. year old female with a history of mood disorder,  generalized anxiety, who presents for follow up appointment for Generalized anxiety disorder  ADD (attention deficit disorder) without hyperactivity  #  Major depressive disorder, recurrent # Generalized anxiety disorder # Panic disorder Patient reports relatively improvement in her mood symptoms since the last visit, which coincided with treatment for hypothryoidism.  She self tapered off Abilify due to concerning side effect of impairment in vision.  Will continue citalopram to target depression and anxiety.  Will continue Xanax as needed for anxiety.  Noted that she reports history of hypomanic symptoms in the past (increased sexual activity, talkativeness, euphoria, excessive shopping) in the context of her father of her son suddenly left the house .  Will continue to monitor.       # ADHD Patient has history of ADHD since age 28.  She reportedly underwent neuropsychology evaluation.  Will start Vyvanse to target ADHD.  Discussed potential side effect of insomnia, decreased appetite, worseneing in anxiety, hypertension and headache (will closely monitor given hypertension; she reported she came here in rush. previously within normal range at baseline).  Also discussed potential worsening in manic symptoms.   Plan I have reviewed the patient's medical history in detail and updated the computerized patient record. 1. Continue citalopram 40 mg daily  2. Start Vyvanse 20 mg daily  3. (Hold abilify, wellbutrin) 4. Continue Xanax 1 mg daily as needed for anxiety 5. Continue Trazodone 25-50 mg at night as needed for sleep (declined refill) 6.Return to clinic in one month for 15 mins.  Past trials of medication: citalopram, lithium, Depakote, Abilify (visual impairment), xanax, ativan, clonazepam, adderall, Ritalin, Concerta,   The patient demonstrates the following risk factors for suicide: Chronic risk factors for suicide include:psychiatric disorder ofanxiety. Acute risk factorsfor  suicide include: N/A. Protective factorsfor this patient include: positive social support, responsibility to others (children, family), coping skills and hope for the future. Considering these factors, the overall suicide risk at this point appears to below. Patientisappropriate for outpatient follow up.  Neysa Hottereina Chinyere Galiano, MD 12/07/2018, 12:14 PM

## 2018-12-07 ENCOUNTER — Encounter (HOSPITAL_COMMUNITY): Payer: Self-pay | Admitting: Psychiatry

## 2018-12-07 ENCOUNTER — Ambulatory Visit (INDEPENDENT_AMBULATORY_CARE_PROVIDER_SITE_OTHER): Payer: Medicaid Other | Admitting: Psychiatry

## 2018-12-07 VITALS — BP 178/66 | HR 85 | Ht 64.0 in | Wt 282.6 lb

## 2018-12-07 DIAGNOSIS — F411 Generalized anxiety disorder: Secondary | ICD-10-CM | POA: Diagnosis not present

## 2018-12-07 DIAGNOSIS — F988 Other specified behavioral and emotional disorders with onset usually occurring in childhood and adolescence: Secondary | ICD-10-CM

## 2018-12-07 MED ORDER — LISDEXAMFETAMINE DIMESYLATE 20 MG PO CAPS: 20.0000 mg | ORAL_CAPSULE | Freq: Every day | ORAL | 0 refills | Status: DC

## 2018-12-07 MED ORDER — CITALOPRAM HYDROBROMIDE 40 MG PO TABS: 40.0000 mg | ORAL_TABLET | Freq: Every day | ORAL | 0 refills | Status: DC

## 2018-12-07 MED ORDER — ALPRAZOLAM 1 MG PO TABS: ORAL_TABLET | ORAL | 0 refills | Status: DC

## 2018-12-07 NOTE — Patient Instructions (Addendum)
1. Continue citalopram 40 mg daily  2. Start Vyvanse 20 mg daily  3. (Hold abilify, wellbutrin) 4. Continue Xanax 1 mg daily as needed for anxiety 5. Return to clinic in one month for 15 mins

## 2018-12-11 ENCOUNTER — Telehealth: Payer: Self-pay | Admitting: Family Medicine

## 2018-12-11 ENCOUNTER — Other Ambulatory Visit: Payer: Self-pay | Admitting: Family Medicine

## 2018-12-11 MED ORDER — FLUCONAZOLE 150 MG PO TABS: ORAL_TABLET | ORAL | 0 refills | Status: DC

## 2018-12-11 NOTE — Telephone Encounter (Signed)
Sent in Diflucan per protocol. Pt contacted and verbalized understanding.

## 2018-12-11 NOTE — Telephone Encounter (Signed)
Pt requesting something be called in for yeast infection. She was seen in our office about a week and a half ago and given an antibiotic. Pt is having itching and a little bit of discharge no burning.   If something is able to be called in please send to [facility] - [place], [place] - [address]

## 2018-12-28 DIAGNOSIS — H5213 Myopia, bilateral: Secondary | ICD-10-CM | POA: Diagnosis not present

## 2018-12-28 DIAGNOSIS — H52223 Regular astigmatism, bilateral: Secondary | ICD-10-CM | POA: Diagnosis not present

## 2019-01-15 DIAGNOSIS — H52223 Regular astigmatism, bilateral: Secondary | ICD-10-CM | POA: Diagnosis not present

## 2019-01-15 DIAGNOSIS — H5213 Myopia, bilateral: Secondary | ICD-10-CM | POA: Diagnosis not present

## 2019-03-26 ENCOUNTER — Other Ambulatory Visit: Payer: Self-pay

## 2019-03-26 ENCOUNTER — Encounter: Payer: Self-pay | Admitting: Family Medicine

## 2019-03-26 ENCOUNTER — Ambulatory Visit (INDEPENDENT_AMBULATORY_CARE_PROVIDER_SITE_OTHER): Payer: Medicaid Other | Admitting: Family Medicine

## 2019-03-26 DIAGNOSIS — J019 Acute sinusitis, unspecified: Secondary | ICD-10-CM | POA: Diagnosis not present

## 2019-03-26 DIAGNOSIS — J4520 Mild intermittent asthma, uncomplicated: Secondary | ICD-10-CM | POA: Diagnosis not present

## 2019-03-26 MED ORDER — ALBUTEROL SULFATE HFA 108 (90 BASE) MCG/ACT IN AERS: 2.0000 | INHALATION_SPRAY | RESPIRATORY_TRACT | 1 refills | Status: DC | PRN

## 2019-03-26 MED ORDER — CEFDINIR 300 MG PO CAPS: ORAL_CAPSULE | ORAL | 0 refills | Status: DC

## 2019-03-26 MED ORDER — PREDNISONE 20 MG PO TABS: ORAL_TABLET | ORAL | 0 refills | Status: DC

## 2019-03-26 NOTE — Progress Notes (Signed)
   Subjective:    Patient ID: Linda [name], female    DOB: [birth year], 28 y.o.   MRN: [id number] Audio plus video Back Pain  This is a new problem. The current episode started 1 to 4 weeks ago. Pain location: top and middle of back. (Trouble breathing (short of breath; hard to take a breath in )) Treatments tried: Tylenol. The treatment provided no relief.  Cough  This is a new problem. The current episode started 1 to 4 weeks ago. Associated symptoms comments: Trouble breathing. Treatments tried: Mucinex. The treatment provided no relief.   Virtual Visit via Video Note  I connected with Linda [name] on 03/26/19 at  1:40 PM EDT by a video enabled telemedicine application and verified that I am speaking with the correct [name] using two identifiers.  Location: Patient: home Provider: office   I discussed the limitations of evaluation and management by telemedicine and the availability of in [name] appointments. The patient expressed understanding and agreed to proceed.  History of Present Illness:    Observations/Objective:   Assessment and Plan:   Follow Up Instructions:    I discussed the assessment and treatment plan with the patient. The patient was provided an opportunity to ask questions and all were answered. The patient agreed with the plan and demonstrated an understanding of the instructions.   The patient was advised to call back or seek an in-[name] evaluation if the symptoms worsen or if the condition fails to improve as anticipated.  I provided of non-face-to-face time during this encounter. Patient unfortunately is a smoker.  Definite wheezing going on last for several weeks.  Positive productive cough.  Upper back pain worse with movement.  Worse with cough.  Linda [place], LPN     Review of Systems  Respiratory: Positive for cough.   Musculoskeletal: Positive for back pain.       Objective:   Physical Exam  Virtual visit       Assessment & Plan:  Impression subacute bronchitis with exacerbation reactive airways and upper trapezius strain.  Discussed.  Prednisone taper.  Antibiotics prescribed.  Albuterol.  Cough.  Stop smoking

## 2019-04-10 ENCOUNTER — Telehealth (HOSPITAL_COMMUNITY): Payer: Self-pay | Admitting: *Deleted

## 2019-04-10 ENCOUNTER — Ambulatory Visit (HOSPITAL_COMMUNITY): Payer: Medicaid Other | Admitting: Psychiatry

## 2019-04-10 ENCOUNTER — Other Ambulatory Visit (HOSPITAL_COMMUNITY): Payer: Self-pay | Admitting: Psychiatry

## 2019-04-10 ENCOUNTER — Telehealth: Payer: Self-pay | Admitting: Family Medicine

## 2019-04-10 MED ORDER — FLUCONAZOLE 150 MG PO TABS: ORAL_TABLET | ORAL | 0 refills | Status: DC

## 2019-04-10 NOTE — Telephone Encounter (Signed)
PHONE LINES/SYSTEM STILL DOWN WILL F/U TOMORROW

## 2019-04-10 NOTE — Telephone Encounter (Signed)
fluconazole (DIFLUCAN) 150 MG tablet  0 ordered         Take one tablet by mouth three days apart     Sent in per protocol - Prescription sent electronically to pharmacy. Patient notified.

## 2019-04-10 NOTE — Telephone Encounter (Signed)
Pt needs Rx for yeast infection due to recent antibiotic use from recent virutal visit   Please advise & call pt    [facility]

## 2019-04-10 NOTE — Telephone Encounter (Signed)
It seems like her PCP,  HOSKINS, CAROLYN C called in refill for that medication. Could you verify it iwht hte pharmacy?

## 2019-04-10 NOTE — Telephone Encounter (Signed)
Dr Vanetta Shawl Patient called requesting refill on Celexa next APPT 04/13/2019

## 2019-04-11 ENCOUNTER — Other Ambulatory Visit (HOSPITAL_COMMUNITY): Payer: Self-pay | Admitting: Psychiatry

## 2019-04-11 MED ORDER — CITALOPRAM HYDROBROMIDE 40 MG PO TABS: 40.0000 mg | ORAL_TABLET | Freq: Every day | ORAL | 0 refills | Status: DC

## 2019-04-11 NOTE — Telephone Encounter (Signed)
Ordered citalopram

## 2019-04-11 NOTE — Telephone Encounter (Signed)
Yes, and according to the chart, she may have other citalopram order/refill by her PCP, HOSKINS, CAROLYN

## 2019-04-11 NOTE — Telephone Encounter (Signed)
Dr Vanetta Shawl  Celexa last refill done by you on 03-08-2019 per [facility]

## 2019-04-11 NOTE — Telephone Encounter (Signed)
Spoke with [employer] & she stated every time her Celexa was filled @ [facility] they were under your name on 02-03-2019 & 03-08-2019

## 2019-04-11 NOTE — Progress Notes (Signed)
Virtual Visit via Video Note  I connected with [name] on 04/13/19 at  8:00 AM EDT by a video enabled telemedicine application and verified that I am speaking with the correct person using two identifiers.   I discussed the limitations of evaluation and management by telemedicine and the availability of in person appointments. The patient expressed understanding and agreed to proceed.      I discussed the assessment and treatment plan with the patient. The patient was provided an opportunity to ask questions and all were answered. The patient agreed with the plan and demonstrated an understanding of the instructions.   The patient was advised to call back or seek an in-person evaluation if the symptoms worsen or if the condition fails to improve as anticipated.  I provided 15 minutes of non-face-to-face time during this encounter.   Neysa Hotter, MD   [facility] MD/PA/NP OP Progress Note  04/13/2019 8:24 AM SHERMA MANGOLD  MRN:  [id number]  Chief Complaint:  Chief Complaint    Follow-up; Depression     HPI:  This is a follow-up visit for depression and anxiety.  She states that she had a period of depression when she is out of work. She felt better soon afterwards. She has just returned to work. She has been functioning fairly well.  She likes the new place after relocation; she reports good relationship with her fianc and her son.  She helps her son for home schooling.  She has occasional initial insomnia.  She denies feeling fatigue.  Fair motivation and energy.  She denies SI.  She feels less anxious/tense.  Denies irritability.  She denies panic attacks.  She has been going to dog park regularly. She has not started vyvanse as she was out of work; she wants to try this now that she has returned; she agrees to take it only when necessary for inattention.    Wt Readings from Last 3 Encounters:  12/07/18 282 lb 9.6 oz (128.2 kg)  11/30/18 200 lb (90.7 kg)  11/27/18 281 lb  9.6 oz (127.7 kg)    Visit Diagnosis:    ICD-10-CM   1. MDD (major depressive disorder), recurrent, in partial remission (HCC) F33.41   2. GAD (generalized anxiety disorder) F41.1     Past Psychiatric History: Please see initial evaluation for full details. I have reviewed the history. No updates at this time.     Past Medical History:  Past Medical History:  Diagnosis Date  . Anxiety   . Asthma   . Heart murmur   . IBS (irritable bowel syndrome)   . Interstitial cystitis     Past Surgical History:  Procedure Laterality Date  . CESAREAN SECTION N/A 03/20/2013   Procedure: CESAREAN SECTION;  Surgeon: [name], MD;  Location: [facility];  Service: Obstetrics;  Laterality: N/A;  . CESAREAN SECTION    . MYRINGOTOMY WITH TUBE PLACEMENT    . NO PAST SURGERIES    . TYMPANOSTOMY TUBE PLACEMENT    . UPPER GI ENDOSCOPY      Family Psychiatric History: Please see initial evaluation for full details. I have reviewed the history. No updates at this time.     Family History:  Family History  Problem Relation Age of Onset  . Cancer Paternal Uncle   . COPD Maternal Grandfather   . Stroke Paternal Grandfather   . Depression Mother   . OCD Mother   . Bipolar disorder Paternal Aunt   . Drug abuse Maternal  Uncle   . Depression Maternal Grandmother   . Anxiety disorder Maternal Grandmother     Social History:  Social History   Socioeconomic History  . Marital status: Single    Spouse name: Not on file  . Number of children: Not on file  . Years of education: Not on file  . Highest education level: Not on file  Occupational History  . Not on file  Social Needs  . Financial resource strain: Not on file  . Food insecurity:    Worry: Not on file    Inability: Not on file  . Transportation needs:    Medical: Not on file    Non-medical: Not on file  Tobacco Use  . Smoking status: Current Every Day Smoker    Packs/day: 0.50    Types: Cigarettes    Start date:  01/28/2013  . Smokeless tobacco: Never Used  Substance and Sexual Activity  . Alcohol use: No    Alcohol/week: 0.0 standard drinks  . Drug use: No  . Sexual activity: Yes    Partners: Male    Birth control/protection: None  Lifestyle  . Physical activity:    Days per week: Not on file    Minutes per session: Not on file  . Stress: Not on file  Relationships  . Social connections:    Talks on phone: Not on file    Gets together: Not on file    Attends religious service: Not on file    Active member of club or organization: Not on file    Attends meetings of clubs or organizations: Not on file    Relationship status: Not on file  Other Topics Concern  . Not on file  Social History Narrative   ** Merged History Encounter **        Allergies:  Allergies  Allergen Reactions  . Buspar [Buspirone] Other (See Comments)    Increased anxiety symptoms  . Ciprofloxacin Nausea Only  . Sulfa Antibiotics Diarrhea and Nausea Only  . Sulfa Antibiotics Nausea And Vomiting  . Buspar [Buspirone] Anxiety    Metabolic Disorder Labs: Lab Results  Component Value Date   HGBA1C 5.0 11/24/2016   No results found for: PROLACTIN No results found for: CHOL, TRIG, HDL, CHOLHDL, VLDL, LDLCALC Lab Results  Component Value Date   TSH 28.86 (H) 07/26/2018   TSH 4.370 11/24/2016    Therapeutic Level Labs: No results found for: LITHIUM No results found for: VALPROATE No components found for:  CBMZ  Current Medications: Current Outpatient Medications  Medication Sig Dispense Refill  . fexofenadine (ALLEGRA) 60 MG tablet Take 60 mg by mouth 2 (two) times daily.    Marland Kitchen albuterol (VENTOLIN HFA) 108 (90 Base) MCG/ACT inhaler Inhale 2 puffs into the lungs every 4 (four) hours as needed for wheezing or shortness of breath. 1 Inhaler 1  . ALPRAZolam (XANAX) 1 MG tablet 0.5-1 mg daily as needed for anxiety 30 tablet 1  . amoxicillin-clavulanate (AUGMENTIN) 875-125 MG tablet One bid for ten d (Patient  not taking: Reported on 03/26/2019) 20 tablet 0  . cefdinir (OMNICEF) 300 MG capsule Take one capsule by mouth twice daily for 10 days 20 capsule 0  . citalopram (CELEXA) 40 MG tablet Take 1 tablet (40 mg total) by mouth daily. 90 tablet 1  . citalopram (CELEXA) 40 MG tablet Take 1 tablet (40 mg total) by mouth daily. 90 tablet 0  . doxycycline (VIBRA-TABS) 100 MG tablet Take 1 tablet (100 mg total) by  mouth 2 (two) times daily. Take with a snack and tall glass of water. (Patient not taking: Reported on 03/26/2019) 20 tablet 0  . fluconazole (DIFLUCAN) 150 MG tablet Take one tablet by mouth three days apart 2 tablet 0  . levothyroxine (SYNTHROID, LEVOTHROID) 75 MCG tablet Take 1 tablet (75 mcg total) by mouth daily. 30 tablet 2  . lisdexamfetamine (VYVANSE) 20 MG capsule Take 1 capsule (20 mg total) by mouth daily. 30 capsule 0  . predniSONE (DELTASONE) 20 MG tablet Three q d for three d, two qd for three d, one q for two d (Patient not taking: Reported on 03/26/2019) 17 tablet 0  . predniSONE (DELTASONE) 20 MG tablet Take 3 qd for 3 days, then 2 qd for 3 days, then one qd for 3 days 18 tablet 0  . traZODone (DESYREL) 50 MG tablet 25-50 mg at night as needed for sleep (Patient not taking: Reported on 10/24/2018) 30 tablet 0   No current facility-administered medications for this visit.      Musculoskeletal: Strength & Muscle Tone: N/A Gait & Station: N/A Patient leans: N/A  Psychiatric Specialty Exam: Review of Systems  Psychiatric/Behavioral: Negative for depression, hallucinations, memory loss, substance abuse and suicidal ideas. The patient is nervous/anxious. The patient does not have insomnia.   All other systems reviewed and are negative.   There were no vitals taken for this visit.There is no height or weight on file to calculate BMI.  General Appearance: Fairly Groomed  Eye Contact:  Good  Speech:  Clear and Coherent  Volume:  Normal  Mood:  "fine"  Affect:  Appropriate,  Congruent and fatigue  Thought Process:  Coherent  Orientation:  Full (Time, Place, and Person)  Thought Content: Logical   Suicidal Thoughts:  No  Homicidal Thoughts:  No  Memory:  Immediate;   Good  Judgement:  Good  Insight:  Fair  Psychomotor Activity:  Normal  Concentration:  Concentration: Good and Attention Span: Good  Recall:  Good  Fund of Knowledge: Good  Language: Good  Akathisia:  No  Handed:  Right  AIMS (if indicated): not done  Assets:  Communication Skills Desire for Improvement  ADL's:  Intact  Cognition: WNL  Sleep:  Poor   Screenings: GAD-7     Office Visit from 03/07/2018 in [facility] Office Visit from 11/22/2017 in [facility]  Total GAD-7 Score  14  15    PHQ2-9     Office Visit from 03/07/2018 in [facility]  PHQ-2 Total Score  5  PHQ-9 Total Score  18       Assessment and Plan:  Omar PersonLisabeth D Melvin is a 28 y.o. year old female with a history of mood disorder, generalized anxiety , who presents for follow up appointment for MDD (major depressive disorder), recurrent, in partial remission (HCC)  GAD (generalized anxiety disorder)  # MDD, moderate, recurrent without psychotic features # Generalized anxiety disorder # Panic disorder There has been more improvement in symptoms since the last visit.  Will continue citalopram to target depression and anxiety.  Will continue Xanax as needed for anxiety.  Noted that although she reports history of hypomanic symptoms in the past  (increased sexual activity, talkativeness, euphoria, excessive shopping) , it was in the context of the father of her son suddenly left the house.  We will continue to monitor.   # ADHD Patient has history of ADHD since age 28.  She reportedly underwent neuropsychological evaluation.  She  has not started Vyvanse yet as she has not returned to work.  She will try this medication if she still struggles with attention; discussed potential side  effect of insomnia, decreased appetite, worsening anxiety, hypertension and headache.   Plan I have reviewed and updated plans as below 1. Continue citalopram 40 mg daily  2. Start Vyvanse 20 mg daily  3. Continue Xanax 1 mg daily as needed for anxiety 4. Hold Trazodone 5. Next appointment: 7/7 at 9:40 for 20 mins, video visit  Past trials of medication:citalopram, lithium, Depakote, Abilify (visual impairment), xanax, ativan, clonazepam, adderall, Ritalin, Concerta,   The patient demonstrates the following risk factors for suicide: Chronic risk factors for suicide include:psychiatric disorder ofanxiety. Acute risk factorsfor suicide include: N/A. Protective factorsfor this patient include: positive social support, responsibility to others (children, family), coping skills and hope for the future. Considering these factors, the overall suicide   Neysa Hotter, MD 04/13/2019, 8:24 AM

## 2019-04-13 ENCOUNTER — Encounter (HOSPITAL_COMMUNITY): Payer: Self-pay | Admitting: Psychiatry

## 2019-04-13 ENCOUNTER — Other Ambulatory Visit: Payer: Self-pay

## 2019-04-13 ENCOUNTER — Ambulatory Visit (INDEPENDENT_AMBULATORY_CARE_PROVIDER_SITE_OTHER): Payer: Medicaid Other | Admitting: Psychiatry

## 2019-04-13 DIAGNOSIS — F411 Generalized anxiety disorder: Secondary | ICD-10-CM

## 2019-04-13 DIAGNOSIS — F3341 Major depressive disorder, recurrent, in partial remission: Secondary | ICD-10-CM | POA: Diagnosis not present

## 2019-04-13 MED ORDER — ALPRAZOLAM 1 MG PO TABS: ORAL_TABLET | ORAL | 1 refills | Status: DC

## 2019-04-13 NOTE — Patient Instructions (Signed)
1. Continue citalopram 40 mg daily  2. Start Vyvanse 20 mg daily  3. Continue Xanax 1 mg daily as needed for anxiety 4. Hold Trazodone 5. Next appointment: 7/7 at 9:40

## 2019-05-15 ENCOUNTER — Other Ambulatory Visit: Payer: Self-pay

## 2019-05-15 ENCOUNTER — Ambulatory Visit (INDEPENDENT_AMBULATORY_CARE_PROVIDER_SITE_OTHER): Payer: Medicaid Other | Admitting: Family Medicine

## 2019-05-15 ENCOUNTER — Encounter: Payer: Self-pay | Admitting: Family Medicine

## 2019-05-15 DIAGNOSIS — J019 Acute sinusitis, unspecified: Secondary | ICD-10-CM | POA: Diagnosis not present

## 2019-05-15 DIAGNOSIS — J4520 Mild intermittent asthma, uncomplicated: Secondary | ICD-10-CM | POA: Diagnosis not present

## 2019-05-15 MED ORDER — PREDNISONE 20 MG PO TABS: ORAL_TABLET | ORAL | 0 refills | Status: DC

## 2019-05-15 MED ORDER — AMOXICILLIN-POT CLAVULANATE 875-125 MG PO TABS: 1.0000 | ORAL_TABLET | Freq: Two times a day (BID) | ORAL | 0 refills | Status: DC

## 2019-05-15 NOTE — Progress Notes (Signed)
Virtual Visit via Video Note  I connected with Linda [name] on 05/22/19 at  9:40 AM EDT by a video enabled telemedicine application and verified that I am speaking with the correct person using two identifiers.   I discussed the limitations of evaluation and management by telemedicine and the availability of in person appointments. The patient expressed understanding and agreed to proceed.      I discussed the assessment and treatment plan with the patient. The patient was provided an opportunity to ask questions and all were answered. The patient agreed with the plan and demonstrated an understanding of the instructions.   The patient was advised to call back or seek an in-person evaluation if the symptoms worsen or if the condition fails to improve as anticipated.  I provided 15 minutes of non-face-to-face time during this encounter.   Neysa Hottereina Evelisse Szalkowski, MD    [facility] MD/PA/NP OP Progress Note  05/22/2019 10:12 AM Linda [name]  MRN:  [id number]  Chief Complaint:  Chief Complaint    Anxiety; Depression; Follow-up     HPI:  This is a follow-up appointment for depression and anxiety.  She states that she has been doing well.  She went back to work.  Although it has been busy, she has been adjusting well to the new routine.  Although she has not taken Vyvanse, she believes she has been functioning well at work.  She reports good relationship with her son.  She also enjoyed some gathering on July 4 weekend with her friends.  She is hoping to go to college, although she is unsure what she will measure.  She has already contacted administrator at the community college to inquire about application.  Although she was hoping to start this fall, she is planning to wait for now as her son will start school soon.  She sleeps well.  She denies feeling depressed.  She has good motivation and energy.  She has good concentration.  She denies SI.  She feels anxious at times; she takes Xanax a few times  per week.  She usually tends to feel anxious socially or at work or even at home.  She denies any panic attacks.  She denies decreased need for sleep or euphoria.   Visit Diagnosis:    ICD-10-CM   1. MDD (major depressive disorder), recurrent, in partial remission (HCC)  F33.41     Past Psychiatric History: Please see initial evaluation for full details. I have reviewed the history. No updates at this time.     Past Medical History:  Past Medical History:  Diagnosis Date  . Anxiety   . Asthma   . Heart murmur   . IBS (irritable bowel syndrome)   . Interstitial cystitis     Past Surgical History:  Procedure Laterality Date  . CESAREAN SECTION N/A 03/20/2013   Procedure: CESAREAN SECTION;  Surgeon: Oliver PilaKathy W Richardson, MD;  Location: [facility];  Service: Obstetrics;  Laterality: N/A;  . CESAREAN SECTION    . MYRINGOTOMY WITH TUBE PLACEMENT    . NO PAST SURGERIES    . TYMPANOSTOMY TUBE PLACEMENT    . UPPER GI ENDOSCOPY      Family Psychiatric History: Please see initial evaluation for full details. I have reviewed the history. No updates at this time.     Family History:  Family History  Problem Relation Age of Onset  . Cancer Paternal Uncle   . COPD Maternal Grandfather   . Stroke Paternal Grandfather   .  Depression Mother   . OCD Mother   . Bipolar disorder Paternal Aunt   . Drug abuse Maternal Uncle   . Depression Maternal Grandmother   . Anxiety disorder Maternal Grandmother     Social History:  Social History   Socioeconomic History  . Marital status: Single    Spouse name: Not on file  . Number of children: Not on file  . Years of education: Not on file  . Highest education level: Not on file  Occupational History  . Not on file  Social Needs  . Financial resource strain: Not on file  . Food insecurity    Worry: Not on file    Inability: Not on file  . Transportation needs    Medical: Not on file    Non-medical: Not on file  Tobacco Use  . Smoking  status: Current Every Day Smoker    Packs/day: 0.50    Types: Cigarettes    Start date: 01/28/2013  . Smokeless tobacco: Never Used  Substance and Sexual Activity  . Alcohol use: No    Alcohol/week: 0.0 standard drinks  . Drug use: No  . Sexual activity: Yes    Partners: Male    Birth control/protection: None  Lifestyle  . Physical activity    Days per week: Not on file    Minutes per session: Not on file  . Stress: Not on file  Relationships  . Social Musicianconnections    Talks on phone: Not on file    Gets together: Not on file    Attends religious service: Not on file    Active member of club or organization: Not on file    Attends meetings of clubs or organizations: Not on file    Relationship status: Not on file  Other Topics Concern  . Not on file  Social History Narrative   ** Merged History Encounter **        Allergies:  Allergies  Allergen Reactions  . Buspar [Buspirone] Other (See Comments)    Increased anxiety symptoms  . Ciprofloxacin Nausea Only  . Sulfa Antibiotics Diarrhea and Nausea Only  . Sulfa Antibiotics Nausea And Vomiting  . Buspar [Buspirone] Anxiety    Metabolic Disorder Labs: Lab Results  Component Value Date   HGBA1C 5.0 11/24/2016   No results found for: PROLACTIN No results found for: CHOL, TRIG, HDL, CHOLHDL, VLDL, LDLCALC Lab Results  Component Value Date   TSH 28.86 (H) 07/26/2018   TSH 4.370 11/24/2016    Therapeutic Level Labs: No results found for: LITHIUM No results found for: VALPROATE No components found for:  CBMZ  Current Medications: Current Outpatient Medications  Medication Sig Dispense Refill  . albuterol (VENTOLIN HFA) 108 (90 Base) MCG/ACT inhaler Inhale 2 puffs into the lungs every 4 (four) hours as needed for wheezing or shortness of breath. 1 Inhaler 1  . ALPRAZolam (XANAX) 1 MG tablet 0.5-1 mg daily as needed for anxiety 30 tablet 1  . amoxicillin-clavulanate (AUGMENTIN) 875-125 MG tablet Take 1 tablet by  mouth 2 (two) times daily. 20 tablet 0  . [START ON 07/12/2019] citalopram (CELEXA) 40 MG tablet Take 1 tablet (40 mg total) by mouth daily. 90 tablet 0  . fexofenadine (ALLEGRA) 60 MG tablet Take 60 mg by mouth 2 (two) times daily.    Marland Kitchen. levothyroxine (SYNTHROID, LEVOTHROID) 75 MCG tablet Take 1 tablet (75 mcg total) by mouth daily. 30 tablet 2  . lisdexamfetamine (VYVANSE) 20 MG capsule Take 1 capsule (20 mg  total) by mouth daily. (Patient not taking: Reported on 05/15/2019) 30 capsule 0  . predniSONE (DELTASONE) 20 MG tablet Take 3 qd for 3 days, then 2 qd for 3 days, then one qd for 3 days 18 tablet 0   No current facility-administered medications for this visit.      Musculoskeletal: Strength & Muscle Tone: N/A Gait & Station: N/A Patient leans: N/A  Psychiatric Specialty Exam: Review of Systems  Psychiatric/Behavioral: Negative for depression, hallucinations, memory loss, substance abuse and suicidal ideas. The patient is nervous/anxious. The patient does not have insomnia.   All other systems reviewed and are negative.   There were no vitals taken for this visit.There is no height or weight on file to calculate BMI.  General Appearance: Fairly Groomed  Eye Contact:  Good  Speech:  Clear and Coherent  Volume:  Normal  Mood:  "better"  Affect:  Appropriate, Congruent and euthymic  Thought Process:  Coherent  Orientation:  Full (Time, Place, and Person)  Thought Content: Logical   Suicidal Thoughts:  No  Homicidal Thoughts:  No  Memory:  Immediate;   Good  Judgement:  Good  Insight:  Fair  Psychomotor Activity:  Normal  Concentration:  Concentration: Good and Attention Span: Good  Recall:  Good  Fund of Knowledge: Good  Language: Good  Akathisia:  No  Handed:  Right  AIMS (if indicated): not done  Assets:  Communication Skills Desire for Improvement  ADL's:  Intact  Cognition: WNL  Sleep:  Fair   Screenings: GAD-7     Office Visit from 03/07/2018 in [place] Office Visit from 11/22/2017 in [place]  Total GAD-7 Score  14  15    PHQ2-9     Office Visit from 03/07/2018 in [place]  PHQ-2 Total Score  5  PHQ-9 Total Score  18       Assessment and Plan:  SUNSET JOSHI is a 28 y.o. year old female with a history of mood disorder, anxiety , who presents for follow up appointment for depression.   # MDD in partial remission # Generalized anxiety disorder # Panic disorder There has been steady improvement in her mood symptoms since the last visit.  Will continue citalopram to target depression and anxiety.  Will continue Xanax as needed for anxiety.  Noted that although she reports history of hypomanic symptoms in the past  (increased sexual activity, talkativeness, euphoria, excessive shopping), it was in the context of the father of her son suddenly left the house.    Will continue to monitor.  Discussed behavioral activation.   # ADHD Patient has history of ADHD since age 4.  She reportedly underwent neuropsychological evaluation.    She has been functioning well at work without starting Vyvanse.  Provided psychoeducation of taking this medication only when she was it is necessary.  Discussed potential side effect of insomnia, decreased appetite, worsening anxiety, hypertension and headache.   Plan I have reviewed and updated plans as below 1. Continue citalopram 40 mg daily  2. (Start Vyvanse 20 mg daily) - she will take it only when it is necessary 3. Continue Xanax 1 mg daily as needed for anxiety - uses it a few times per week 4. Next appointment: 10/5 at 1 PM for 20 mins, video   Past trials of medication:citalopram, lithium, Depakote, Abilify (visual impairment),xanax, ativan, clonazepam, adderall,Ritalin, Concerta,  The patient demonstrates the following risk factors for suicide: Chronic risk factors for suicide  include:psychiatric disorder ofanxiety. Acute risk factorsfor  suicide include: N/A. Protective factorsfor this patient include: positive social support, responsibility to others (children, family), coping skills and hope for the future. Considering these factors, the overall suicide   Neysa Hottereina Francisca Langenderfer, MD 05/22/2019, 10:12 AM

## 2019-05-15 NOTE — Progress Notes (Signed)
   Subjective:    Patient ID: Linda [name], female    DOB: 1991/08/15, 28 y.o.   MRN: [id number] Audio plus video Sinusitis This is a new problem. Episode onset: one to one and a half weeks ago. There has been no fever. Associated symptoms include congestion and coughing. Treatments tried: mucinex, allegra.   Virtual Visit via Video Note  I connected with Linda [name] on 05/15/19 at 10:00 AM EDT by a video enabled telemedicine application and verified that I am speaking with the correct person using two identifiers.  Location: Patient: home Provider: office   I discussed the limitations of evaluation and management by telemedicine and the availability of in person appointments. The patient expressed understanding and agreed to proceed.  History of Present Illness:    Observations/Objective:   Assessment and Plan:   Follow Up Instructions:    I discussed the assessment and treatment plan with the patient. The patient was provided an opportunity to ask questions and all were answered. The patient agreed with the plan and demonstrated an understanding of the instructions.   The patient was advised to call back or seek an in-person evaluation if the symptoms worsen or if the condition fails to improve as anticipated.  I provided 18 minutes of non-face-to-face time during this encounter.      Review of Systems  HENT: Positive for congestion.   Respiratory: Positive for cough.        Objective:   Physical Exam  Virtual impression impression rhinosinusitis      Assessment & Plan:  Rhinosinusitis plus bronchitis symptom care discussed antibiotics prescribed.  10 days duration.  Also flare of reactive airways.  Using albuterol multiple times per day.  No fever no sore throat no achiness.  Patient has not been exposed to anyone sick as far as she knows.  She states this feels like her classic sinusitis bronchitis and has been going on as noted for 10 days.  Will  treat with antibiotics.  Warning signs discussed

## 2019-05-22 ENCOUNTER — Ambulatory Visit (INDEPENDENT_AMBULATORY_CARE_PROVIDER_SITE_OTHER): Payer: Medicaid Other | Admitting: Psychiatry

## 2019-05-22 ENCOUNTER — Other Ambulatory Visit: Payer: Self-pay

## 2019-05-22 ENCOUNTER — Encounter (HOSPITAL_COMMUNITY): Payer: Self-pay | Admitting: Psychiatry

## 2019-05-22 DIAGNOSIS — F3341 Major depressive disorder, recurrent, in partial remission: Secondary | ICD-10-CM

## 2019-05-22 MED ORDER — CITALOPRAM HYDROBROMIDE 40 MG PO TABS: 40.0000 mg | ORAL_TABLET | Freq: Every day | ORAL | 0 refills | Status: DC

## 2019-05-22 NOTE — Patient Instructions (Signed)
1. Continue citalopram 40 mg daily  2. (Start Vyvanse 20 mg daily)  3. Continue Xanax 1 mg daily as needed for anxiety 4. Next appointment: 10/5 at 1 PM

## 2019-06-05 DIAGNOSIS — N911 Secondary amenorrhea: Secondary | ICD-10-CM | POA: Diagnosis not present

## 2019-06-05 DIAGNOSIS — Z3202 Encounter for pregnancy test, result negative: Secondary | ICD-10-CM | POA: Diagnosis not present

## 2019-06-25 ENCOUNTER — Emergency Department (HOSPITAL_COMMUNITY)
Admission: EM | Admit: 2019-06-25 | Discharge: 2019-06-26 | Disposition: A | Discharge status: Home / Self Care | Payer: Medicaid Other | Attending: Emergency Medicine | Admitting: Emergency Medicine

## 2019-06-25 ENCOUNTER — Emergency Department (HOSPITAL_COMMUNITY): Payer: Medicaid Other

## 2019-06-25 ENCOUNTER — Other Ambulatory Visit: Payer: Self-pay

## 2019-06-25 ENCOUNTER — Encounter (HOSPITAL_COMMUNITY): Payer: Self-pay | Admitting: Emergency Medicine

## 2019-06-25 DIAGNOSIS — M549 Dorsalgia, unspecified: Secondary | ICD-10-CM | POA: Insufficient documentation

## 2019-06-25 DIAGNOSIS — F17210 Nicotine dependence, cigarettes, uncomplicated: Secondary | ICD-10-CM | POA: Insufficient documentation

## 2019-06-25 DIAGNOSIS — R062 Wheezing: Secondary | ICD-10-CM | POA: Diagnosis present

## 2019-06-25 DIAGNOSIS — R0602 Shortness of breath: Secondary | ICD-10-CM | POA: Diagnosis not present

## 2019-06-25 DIAGNOSIS — J4541 Moderate persistent asthma with (acute) exacerbation: Secondary | ICD-10-CM

## 2019-06-25 DIAGNOSIS — Z79899 Other long term (current) drug therapy: Secondary | ICD-10-CM | POA: Diagnosis not present

## 2019-06-25 LAB — D-DIMER, QUANTITATIVE: D-Dimer, Quant: 0.31 ug/mL-FEU (ref 0.00–0.50)

## 2019-06-25 MED ORDER — DEXAMETHASONE SODIUM PHOSPHATE 10 MG/ML IJ SOLN
10.0000 mg | Freq: Once | INTRAMUSCULAR | Status: AC
  Administered 2019-06-25: 10 mg via INTRAVENOUS
  Filled 2019-06-25: qty 1

## 2019-06-25 MED ORDER — ALBUTEROL SULFATE HFA 108 (90 BASE) MCG/ACT IN AERS
2.0000 | INHALATION_SPRAY | Freq: Once | RESPIRATORY_TRACT | Status: AC
  Administered 2019-06-25: 22:00:00 2 via RESPIRATORY_TRACT
  Filled 2019-06-25: qty 6.7

## 2019-06-25 MED ORDER — KETOROLAC TROMETHAMINE 30 MG/ML IJ SOLN
30.0000 mg | Freq: Once | INTRAMUSCULAR | Status: AC
  Administered 2019-06-25: 30 mg via INTRAVENOUS
  Filled 2019-06-25: qty 1

## 2019-06-25 MED ORDER — ONDANSETRON HCL 4 MG/2ML IJ SOLN
4.0000 mg | Freq: Once | INTRAMUSCULAR | Status: AC
  Administered 2019-06-25: 4 mg via INTRAVENOUS
  Filled 2019-06-25: qty 2

## 2019-06-25 NOTE — ED Provider Notes (Signed)
[facility] EMERGENCY DEPARTMENT Provider Note   CSN: [id number] Arrival date & time: 06/25/19  2014     History   Chief Complaint Chief Complaint  Patient presents with  . Back Pain    HPI Omar PersonLisabeth D Byers is a 28 y.o. female.     Patient is a 28 year old female who presents to the emergency department with a complaint of wheezing and mid upper back pain.  Patient states the back pain is been going off and on for about 2 weeks.  She says in the last 2 days it has been more difficult to breathe, she is noted more wheezing.  She denies hemoptysis.  She denies fever or chills.  She denies any recent injury or trauma.  There is been no recent operations or procedures involving her chest or back.  Nothing really makes the pain any better.  Deep breathing and certain movements make the pain worse.  The history is provided by the patient.  Back Pain Associated symptoms: no abdominal pain, no chest pain and no dysuria     Past Medical History:  Diagnosis Date  . Anxiety   . Asthma   . Heart murmur   . IBS (irritable bowel syndrome)   . Interstitial cystitis     Patient Active Problem List   Diagnosis Date Noted  . Mood disorder in conditions classified elsewhere 04/06/2018  . Generalized anxiety disorder 04/06/2018  . Hypothyroidism 06/21/2014  . Interstitial cystitis 06/14/2014  . 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 Morbid obesity (HCC) 02/07/2014  . Asthma, chronic 04/15/2013  . Chronic anxiety 04/15/2013  . Cervical strain 03/07/2012  . Sprain and strain of unspecified site of shoulder and upper arm 03/07/2012  . Pain in joint, shoulder region 03/07/2012    Past Surgical History:  Procedure Laterality Date  . CESAREAN SECTION N/A 03/20/2013   Procedure: CESAREAN SECTION;  Surgeon: Oliver PilaKathy W Richardson, MD;  Location: [facility];  Service: Obstetrics;  Laterality: N/A;  . CESAREAN SECTION    . MYRINGOTOMY WITH TUBE PLACEMENT    . NO PAST  SURGERIES    . TYMPANOSTOMY TUBE PLACEMENT    . UPPER GI ENDOSCOPY       OB History    Gravida  1   Para  1   Term  0   Preterm  1   AB  0   Living  1     SAB  0   TAB  0   Ectopic  0   Multiple      Live Births  1            Home Medications    Prior to Admission medications   Medication Sig Start Date End Date Taking? Authorizing Provider  albuterol (VENTOLIN HFA) 108 (90 Base) MCG/ACT inhaler Inhale 2 puffs into the lungs every 4 (four) hours as needed for wheezing or shortness of breath. 03/26/19  Yes Merlyn AlbertLuking, William S, MD  ALPRAZolam Prudy Feeler(XANAX) 1 MG tablet 0.5-1 mg daily as needed for anxiety Patient taking differently: Take 0.5-1 mg by mouth daily as needed for anxiety or sleep.  04/13/19  Yes Hisada, Barbee Cougheina, MD  citalopram (CELEXA) 40 MG tablet Take 1 tablet (40 mg total) by mouth daily. 07/12/19  Yes Neysa HotterHisada, Reina, MD  fexofenadine (ALLEGRA) 180 MG tablet Take 180 mg by mouth daily as needed for allergies.    Yes [provider]  ibuprofen (ADVIL) 200 MG tablet Take 800 mg by mouth every 6 (  six) hours as needed for mild pain or moderate pain.   Yes [provider]  levothyroxine (SYNTHROID, LEVOTHROID) 75 MCG tablet Take 1 tablet (75 mcg total) by mouth daily. 08/02/18  Yes Mikey Kirschner, MD  Lidocaine HCl (ASPERCREME LIDOCAINE) 4 % LIQD Apply 1 application topically daily as needed (for back pain).   Yes [provider]  amoxicillin-clavulanate (AUGMENTIN) 875-125 MG tablet Take 1 tablet by mouth 2 (two) times daily. Patient not taking: Reported on 06/25/2019 05/15/19   Mikey Kirschner, MD    Family History Family History  Problem Relation Age of Onset  . Cancer Paternal Uncle   . COPD Maternal Grandfather   . Stroke Paternal Grandfather   . Depression Mother   . OCD Mother   . Bipolar disorder Paternal Aunt   . Drug abuse Maternal Uncle   . Depression Maternal Grandmother   . Anxiety disorder Maternal Grandmother      Social History Social History   Tobacco Use  . Smoking status: Current Every Day Smoker    Packs/day: 0.50    Types: Cigarettes    Start date: 01/28/2013  . Smokeless tobacco: Never Used  Substance Use Topics  . Alcohol use: No    Alcohol/week: 0.0 standard drinks  . Drug use: No     Allergies   Ciprofloxacin, Sulfa antibiotics, Buspar [buspirone], and Levaquin [levofloxacin]   Review of Systems Review of Systems  Constitutional: Negative for activity change.       All ROS Neg except as noted in HPI  HENT: Negative.   Eyes: Negative for photophobia and discharge.  Respiratory: Positive for cough and wheezing. Negative for shortness of breath.   Cardiovascular: Negative for chest pain and palpitations.  Gastrointestinal: Negative for abdominal pain and blood in stool.  Genitourinary: Negative for dysuria, frequency and hematuria.  Musculoskeletal: Positive for back pain. Negative for arthralgias and neck pain.  Skin: Negative.   Neurological: Negative for dizziness, seizures and speech difficulty.  Psychiatric/Behavioral: Negative for confusion and hallucinations.     Physical Exam Updated Vital Signs BP 136/87   Pulse 89   Temp 98.4 F (36.9 C)   Resp 17   LMP 06/17/2019   SpO2 98%   Physical Exam Vitals signs and nursing note reviewed.  Constitutional:      Appearance: She is well-developed. She is not toxic-appearing.  HENT:     Head: Normocephalic.     Right Ear: Tympanic membrane and external ear normal.     Left Ear: Tympanic membrane and external ear normal.  Eyes:     General: Lids are normal.     Pupils: Pupils are equal, round, and reactive to light.  Neck:     Musculoskeletal: Normal range of motion and neck supple.     Vascular: No carotid bruit.  Cardiovascular:     Rate and Rhythm: Normal rate and regular rhythm.     Pulses: Normal pulses.     Heart sounds: Normal heart sounds.  Pulmonary:     Effort: No respiratory distress.     Breath  sounds: Wheezing present.  Abdominal:     General: Bowel sounds are normal.     Palpations: Abdomen is soft.     Tenderness: There is no abdominal tenderness. There is no guarding.  Musculoskeletal: Normal range of motion.     Thoracic back: She exhibits pain. She exhibits no bony tenderness and no deformity.  Lymphadenopathy:     Head:     Right  side of head: No submandibular adenopathy.     Left side of head: No submandibular adenopathy.     Cervical: No cervical adenopathy.  Skin:    General: Skin is warm and dry.  Neurological:     Mental Status: She is alert and oriented to person, place, and time.     Cranial Nerves: No cranial nerve deficit.     Sensory: No sensory deficit.  Psychiatric:        Speech: Speech normal.      ED Treatments / Results  Labs (all labs ordered are listed, but only abnormal results are displayed) Labs Reviewed  D-DIMER, QUANTITATIVE (NOT AT [facility])    EKG None  Radiology No results found.  Procedures Procedures (including critical care time)  Medications Ordered in ED Medications  albuterol (VENTOLIN HFA) 108 (90 Base) MCG/ACT inhaler 2 puff (has no administration in time range)  dexamethasone (DECADRON) injection 10 mg (has no administration in time range)     Initial Impression / Assessment and Plan / ED Course  I have reviewed the triage vital signs and the nursing notes.  Pertinent labs & imaging results that were available during my care of the patient were reviewed by me and considered in my medical decision making (see chart for details).          Final Clinical Impressions(s) / ED Diagnoses MDM  Vital signs reviewed.  Pulse oximetry is 98% on room air.  Within normal limits by my interpretation.  The patient has bilateral wheezing present.  There is symmetrical rise and fall of the chest.  And the patient speaks in complete sentences.  Patient treated with albuterol inhaler and IV steroids.  IV Toradol given for back  pain.  Chest x-ray is negative for any acute changes or problems.  D-dimer test is negative for possible blood clot.  After treatment, patient is ambulatory in the room without problem, she states that she feels much better.  Wheezing improving.  Patient continues to speak in complete sentences without problem.  Prescription for Decadron twice daily given to the patient.  The patient to use albuterol every 4 hours over the next few days.  She will use Tylenol extra strength or ibuprofen for soreness in her back.  She is to see her primary doctor for follow-up and recheck.  She is to return to the emergency department if any worsening of her conditions, changes in her symptoms, problems, or concerns.    Final diagnoses:  Moderate persistent asthma with exacerbation    ED Discharge Orders         Ordered    dexamethasone (DECADRON) 4 MG tablet  2 times daily with meals     06/26/19 0024           Ivery QualeBryant, Antolin Belsito, PA-C 06/26/19 0032    Raeford RazorKohut, Stephen, MD 06/28/19 [phone]

## 2019-06-25 NOTE — ED Triage Notes (Signed)
Pt c/o mid-upper back pain x 2 weeks. Pt states it hurts to breathe x 2 days.

## 2019-06-26 MED ORDER — DEXAMETHASONE 4 MG PO TABS: 4.0000 mg | ORAL_TABLET | Freq: Two times a day (BID) | ORAL | 0 refills | Status: DC

## 2019-06-26 NOTE — Discharge Instructions (Addendum)
Your d-dimer test is a predictor for blood clots, and it is negative.  Your chest x-ray is negative.  Your oxygen level is 98% on room air, which is within normal limits.  Please use 2 puffs of your albuterol inhaler every 4 hours over the next few days.  Please use Decadron 2 times daily until all taken.  Please take this medication with food.  Please see your primary physician or return to the emergency department if any difficulty with breathing, shortness of breath, worsening of your condition, problems or concerns.  Please stop smoking.

## 2019-07-10 ENCOUNTER — Ambulatory Visit (INDEPENDENT_AMBULATORY_CARE_PROVIDER_SITE_OTHER): Payer: Medicaid Other | Admitting: Family Medicine

## 2019-07-10 ENCOUNTER — Encounter: Payer: Self-pay | Admitting: Family Medicine

## 2019-07-10 ENCOUNTER — Other Ambulatory Visit: Payer: Self-pay

## 2019-07-10 ENCOUNTER — Telehealth (HOSPITAL_COMMUNITY): Payer: Self-pay | Admitting: Psychiatry

## 2019-07-10 ENCOUNTER — Encounter (HOSPITAL_COMMUNITY): Payer: Self-pay | Admitting: Psychiatry

## 2019-07-10 ENCOUNTER — Ambulatory Visit (INDEPENDENT_AMBULATORY_CARE_PROVIDER_SITE_OTHER): Payer: Medicaid Other | Admitting: Psychiatry

## 2019-07-10 DIAGNOSIS — F3341 Major depressive disorder, recurrent, in partial remission: Secondary | ICD-10-CM | POA: Diagnosis not present

## 2019-07-10 DIAGNOSIS — F988 Other specified behavioral and emotional disorders with onset usually occurring in childhood and adolescence: Secondary | ICD-10-CM | POA: Diagnosis not present

## 2019-07-10 DIAGNOSIS — K121 Other forms of stomatitis: Secondary | ICD-10-CM | POA: Diagnosis not present

## 2019-07-10 MED ORDER — LISDEXAMFETAMINE DIMESYLATE 30 MG PO CAPS: 30.0000 mg | ORAL_CAPSULE | Freq: Every day | ORAL | 0 refills | Status: DC

## 2019-07-10 MED ORDER — FLUCONAZOLE 150 MG PO TABS: ORAL_TABLET | ORAL | 0 refills | Status: DC

## 2019-07-10 MED ORDER — FIRST-DUKES MOUTHWASH MT SUSP: OROMUCOSAL | 0 refills | Status: DC

## 2019-07-10 MED ORDER — ALPRAZOLAM 1 MG PO TABS: ORAL_TABLET | ORAL | 0 refills | Status: DC

## 2019-07-10 NOTE — Progress Notes (Signed)
Virtual Visit via Video Note  I connected with Omar PersonLisabeth D Croom on 07/10/19 at 10:00 AM EDT by a video enabled telemedicine application and verified that I am speaking with the correct person using two identifiers.   I discussed the limitations of evaluation and management by telemedicine and the availability of in person appointments. The patient expressed understanding and agreed to proceed.   I discussed the assessment and treatment plan with the patient. The patient was provided an opportunity to ask questions and all were answered. The patient agreed with the plan and demonstrated an understanding of the instructions.   The patient was advised to call back or seek an in-person evaluation if the symptoms worsen or if the condition fails to improve as anticipated.  I provided 15 minutes of non-face-to-face time during this encounter.   Neysa Hottereina Willean Schurman, MD    [facility] MD/PA/NP OP Progress Note  07/10/2019 10:56 AM Omar PersonLisabeth D Zaucha  MRN:  [id number]  Chief Complaint:  Chief Complaint    Depression; Follow-up     HPI:  She checked in 30 mins late for 15 mins appointment.  This is a follow-up appointment for depression and ADHD.  She states that she has been struggling with concentration.  She works full-time, and her son started to go back to school online. Her son is diagnosed with ADHD and will start medication soon.  Although her fianc's mother is helping her son, it has been difficult due to technical issues.  She is also hoping to start school soon.  She has occasional insomnia.  She denies feeling depressed.  She has fair appetite.  She denies SI.  She feels anxious and tense at times; she takes Xanax up to 3 times a week, although she tries not to take it so that she will not be dependent on the medication. She denies panic attacks. She has difficulty in completing tasks and sustaining attention. Although she started vyvanse 20 mg daily, it has not made any difference. She denies any  side effect from the medication.   Visit Diagnosis:    ICD-10-CM   1. MDD (major depressive disorder), recurrent, in partial remission (HCC)  F33.41   2. ADD (attention deficit disorder) without hyperactivity  F98.8     Past Psychiatric History: Please see initial evaluation for full details. I have reviewed the history. No updates at this time.     Past Medical History:  Past Medical History:  Diagnosis Date  . Anxiety   . Asthma   . Heart murmur   . IBS (irritable bowel syndrome)   . Interstitial cystitis     Past Surgical History:  Procedure Laterality Date  . CESAREAN SECTION N/A 03/20/2013   Procedure: CESAREAN SECTION;  Surgeon: Oliver PilaKathy W Richardson, MD;  Location: [facility];  Service: Obstetrics;  Laterality: N/A;  . CESAREAN SECTION    . MYRINGOTOMY WITH TUBE PLACEMENT    . NO PAST SURGERIES    . TYMPANOSTOMY TUBE PLACEMENT    . UPPER GI ENDOSCOPY      Family Psychiatric History: Please see initial evaluation for full details. I have reviewed the history. No updates at this time.     Family History:  Family History  Problem Relation Age of Onset  . Cancer Paternal Uncle   . COPD Maternal Grandfather   . Stroke Paternal Grandfather   . Depression Mother   . OCD Mother   . Bipolar disorder Paternal Aunt   . Drug abuse Maternal Uncle   .  Depression Maternal Grandmother   . Anxiety disorder Maternal Grandmother     Social History:  Social History   Socioeconomic History  . Marital status: Single    Spouse name: Not on file  . Number of children: Not on file  . Years of education: Not on file  . Highest education level: Not on file  Occupational History  . Not on file  Social Needs  . Financial resource strain: Not on file  . Food insecurity    Worry: Not on file    Inability: Not on file  . Transportation needs    Medical: Not on file    Non-medical: Not on file  Tobacco Use  . Smoking status: Current Every Day Smoker    Packs/day: 0.50    Types:  Cigarettes    Start date: 01/28/2013  . Smokeless tobacco: Never Used  Substance and Sexual Activity  . Alcohol use: No    Alcohol/week: 0.0 standard drinks  . Drug use: No  . Sexual activity: Yes    Partners: Male    Birth control/protection: None  Lifestyle  . Physical activity    Days per week: Not on file    Minutes per session: Not on file  . Stress: Not on file  Relationships  . Social Musicianconnections    Talks on phone: Not on file    Gets together: Not on file    Attends religious service: Not on file    Active member of club or organization: Not on file    Attends meetings of clubs or organizations: Not on file    Relationship status: Not on file  Other Topics Concern  . Not on file  Social History Narrative   ** Merged History Encounter **        Allergies:  Allergies  Allergen Reactions  . Ciprofloxacin Nausea Only  . Sulfa Antibiotics Diarrhea and Nausea And Vomiting  . Buspar [Buspirone] Anxiety  . Levaquin [Levofloxacin] Anxiety    Metabolic Disorder Labs: Lab Results  Component Value Date   HGBA1C 5.0 11/24/2016   No results found for: PROLACTIN No results found for: CHOL, TRIG, HDL, CHOLHDL, VLDL, LDLCALC Lab Results  Component Value Date   TSH 28.86 (H) 07/26/2018   TSH 4.370 11/24/2016    Therapeutic Level Labs: No results found for: LITHIUM No results found for: VALPROATE No components found for:  CBMZ  Current Medications: Current Outpatient Medications  Medication Sig Dispense Refill  . albuterol (VENTOLIN HFA) 108 (90 Base) MCG/ACT inhaler Inhale 2 puffs into the lungs every 4 (four) hours as needed for wheezing or shortness of breath. 1 Inhaler 1  . [START ON 07/20/2019] ALPRAZolam (XANAX) 1 MG tablet 0.5-1 mg daily as needed for anxiety 30 tablet 0  . amoxicillin-clavulanate (AUGMENTIN) 875-125 MG tablet Take 1 tablet by mouth 2 (two) times daily. (Patient not taking: Reported on 06/25/2019) 20 tablet 0  . [START ON 07/12/2019] citalopram  (CELEXA) 40 MG tablet Take 1 tablet (40 mg total) by mouth daily. 90 tablet 0  . dexamethasone (DECADRON) 4 MG tablet Take 1 tablet (4 mg total) by mouth 2 (two) times daily with a meal. 10 tablet 0  . fexofenadine (ALLEGRA) 180 MG tablet Take 180 mg by mouth daily as needed for allergies.     Marland Kitchen. ibuprofen (ADVIL) 200 MG tablet Take 800 mg by mouth every 6 (six) hours as needed for mild pain or moderate pain.    Marland Kitchen. levothyroxine (SYNTHROID, LEVOTHROID) 75 MCG tablet  Take 1 tablet (75 mcg total) by mouth daily. 30 tablet 2  . Lidocaine HCl (ASPERCREME LIDOCAINE) 4 % LIQD Apply 1 application topically daily as needed (for back pain).    Marland Kitchen lisdexamfetamine (VYVANSE) 30 MG capsule Take 1 capsule (30 mg total) by mouth daily. 30 capsule 0  . [START ON 08/10/2019] lisdexamfetamine (VYVANSE) 30 MG capsule Take 1 capsule (30 mg total) by mouth daily. 30 capsule 0   No current facility-administered medications for this visit.      Musculoskeletal: Strength & Muscle Tone: N/A Gait & Station: N/A Patient leans: N/A  Psychiatric Specialty Exam: Review of Systems  Psychiatric/Behavioral: Negative for depression, hallucinations, memory loss, substance abuse and suicidal ideas. The patient has insomnia. The patient is not nervous/anxious.   All other systems reviewed and are negative.   Last menstrual period 06/17/2019.There is no height or weight on file to calculate BMI.  General Appearance: Fairly Groomed  Eye Contact:  Good  Speech:  Clear and Coherent  Volume:  Normal  Mood:  Anxious  Affect:  Appropriate, Congruent and Restricted  Thought Process:  Coherent  Orientation:  Full (Time, Place, and Person)  Thought Content: Logical   Suicidal Thoughts:  No  Homicidal Thoughts:  No  Memory:  Immediate;   Good  Judgement:  Good  Insight:  Fair  Psychomotor Activity:  Normal  Concentration:  Concentration: Good and Attention Span: Good  Recall:  Good  Fund of Knowledge: Good  Language: Good   Akathisia:  No  Handed:  Right  AIMS (if indicated): not done  Assets:  Communication Skills Desire for Improvement  ADL's:  Intact  Cognition: WNL  Sleep:  Poor   Screenings: GAD-7     Office Visit from 03/07/2018 in [facility] Office Visit from 11/22/2017 in [facility]  Total GAD-7 Score  14  15    PHQ2-9     Office Visit from 03/07/2018 in [facility]  PHQ-2 Total Score  5  PHQ-9 Total Score  18       Assessment and Plan:  MELAYNA RASPANTI is a 28 y.o. year old female with a history of mood disorder, anxiety, who presents for follow up appointment for depression.   # MDD in partial remission # Generalized anxiety disorder # Panic disorder She denies significant mood symptoms except occasional anxiety in the context of her son starting online school.  Will continue citalopram to target depression and anxiety.  Will continue Xanax as needed for anxiety.  Noted that although she reports history of hypomanic symptoms in the past (increased sexual activity, talkativeness, euphoria, excessive shopping),it was in the context of the father of her son suddenly left the house.Will continue to monitor.    # ADHD (Patient has history of ADHD since age 47. She reportedly underwent neuropsychological evaluation) She reports more difficulty in attention since her son starting school.  Will uptitrate Vyvanse to target ADHD.  Discussed potential side effect, which includes insomnia, decreased appetite, worsening anxiety, hypertension and headache.  Noted that she started on bupropion for smoking cessation; she is advised to hold this medication at this time to avoid potential side effect.  She agrees to try nicotine patch if that is applicable.   Plan  I have reviewed and updated plans as below 1. Continue citalopram 40 mg daily  2. Increase Vyvanse 30 mg daily  3. Hold bupropion (she was prescribed for smoking cessation) 4. Continue Xanax 1  mg daily as needed  for anxiety - uses it a few times per week 06/19/2019  5. Next appointment: 10/7 at 10:20 for 20 mins, video  Past trials of medication:citalopram, lithium, Depakote, Abilify (visual impairment),xanax, ativan, clonazepam, adderall,Ritalin, Concerta,  The patient demonstrates the following risk factors for suicide: Chronic risk factors for suicide include:psychiatric disorder ofanxiety. Acute risk factorsfor suicide include: N/A. Protective factorsfor this patient include: positive social support, responsibility to others (children, family), coping skills and hope for the future. Considering these factors, the overall suicide   Norman Clay, MD 07/10/2019, 10:56 AM

## 2019-07-10 NOTE — Patient Instructions (Signed)
1. Continue citalopram 40 mg daily  2. Increase Vyvanse 30 mg daily  3. Hold bupropion  4. Continue Xanax 1 mg daily as needed for anxiety  5. Next appointment: 10/7 at 10:20

## 2019-07-10 NOTE — Progress Notes (Signed)
   Subjective:  Audio plus video  Patient ID: Linda [name], female    DOB: 04/12/1991, 28 y.o.   MRN: [id number]  Mouth Lesions  The current episode started 5 to 7 days ago. Associated symptoms include mouth sores.  Tongue burns when pt eats or drinks, has not noticed any sores. Raw feeling on tongue. Pt has tried Tylenol and Goody. Goody powder did help. Pt noticed that she has some white areas on tongue.  Pt went to hospital about 2 weeks for back pain and trouble breathing. Gave steroids that she never took before and did not taper off them. After that steroid, pt felt like she had the flu. Taste buds while on steroid were messed up.   Virtual Visit via Video Note  I connected with [name] on 07/10/19 at  4:10 PM EDT by a video enabled telemedicine application and verified that I am speaking with the correct person using two identifiers.  Location: Patient: home Provider: office   I discussed the limitations of evaluation and management by telemedicine and the availability of in person appointments. The patient expressed understanding and agreed to proceed.  History of Present Illness:    Observations/Objective:   Assessment and Plan:   Follow Up Instructions:    I discussed the assessment and treatment plan with the patient. The patient was provided an opportunity to ask questions and all were answered. The patient agreed with the plan and demonstrated an understanding of the instructions.   The patient was advised to call back or seek an in-person evaluation if the symptoms worsen or if the condition fails to improve as anticipated.  I provided 18 minutes of non-face-to-face time during this encounter. Patient received steroids at the hospital.  Soon after that she started developing acute symptoms when the steroids stopped.  She was achy.  Muscle aches joint aches.  She attributes this to withdrawal and never wants to take these type of steroids again in the  future  Patient also notes in the past week difficulties with progressive discomfort in the mouth.  Sharp at times achy at times.  Very hypersensitive.  White patches on tongue and throat  Vicente Males, LPN  Review of Systems  HENT: Positive for mouth sores.    No headache no chest pain    Objective:   Physical Exam   Virtual     Assessment & Plan:  Impression 1 steroid withdrawal discussed  2.  Stomatitis potential fungal element discussed D[employer] Magic mouthwash plus Diflucan warning signs discussed

## 2019-07-10 NOTE — Telephone Encounter (Signed)
Sent link for video visit through [employer] [facility]. Patient did not sign in. Called the patient  twice for appointment scheduled today. The patient did not answer the phone. Mail box is full, and unable to leave voice message.

## 2019-07-19 ENCOUNTER — Ambulatory Visit
Admission: EM | Admit: 2019-07-19 | Discharge: 2019-07-19 | Disposition: A | Discharge status: Home / Self Care | Payer: Medicaid Other

## 2019-07-19 ENCOUNTER — Other Ambulatory Visit: Payer: Self-pay

## 2019-07-19 DIAGNOSIS — Z0189 Encounter for other specified special examinations: Secondary | ICD-10-CM | POA: Diagnosis not present

## 2019-07-19 DIAGNOSIS — L0231 Cutaneous abscess of buttock: Secondary | ICD-10-CM

## 2019-07-19 DIAGNOSIS — Z7689 Persons encountering health services in other specified circumstances: Secondary | ICD-10-CM

## 2019-07-19 MED ORDER — AMOXICILLIN-POT CLAVULANATE 875-125 MG PO TABS: 1.0000 | ORAL_TABLET | Freq: Two times a day (BID) | ORAL | 0 refills | Status: AC

## 2019-07-19 NOTE — Discharge Instructions (Signed)
Abscess with incision and drainage:  Keep dry and covered for next 24-48 hours Remove packing in 48 hours either at home or return here After packing is removed in you may then begin appling warm compresses 3-4x daily for 10-15 minutes.  You may then wash site daily with warm water and mild soap Keep covered to avoid friction Take antibiotic as prescribed and to completion Return sooner or go to the ED if you have any new or worsening symptoms such as increased redness, swelling, pain, nausea, vomiting, fever, chills, etc..Marland Kitchen

## 2019-07-19 NOTE — ED Triage Notes (Signed)
Pt has abscess under left buttock

## 2019-07-19 NOTE — ED Provider Notes (Signed)
[facility]   [id number] 07/19/19 Arrival Time: 1623   CC: ABSCESS  SUBJECTIVE:  Linda [name] is a 28 y.o. female who presents with a possible abscess of her left buttock x 1 week. Denies trauma, however, does admit to shaving in that area prior to symptoms.  Has tried warm compresses without relief.  Worse with sitting.  Denies similar symptom in the past. Complains of redness and swelling.  Denies fever, chills, nausea, vomiting, or discharge.    ROS: As per HPI.  All other pertinent ROS negative.     Past Medical History:  Diagnosis Date  . Anxiety   . Asthma   . Heart murmur   . IBS (irritable bowel syndrome)   . Interstitial cystitis    Past Surgical History:  Procedure Laterality Date  . CESAREAN SECTION N/A 03/20/2013   Procedure: CESAREAN SECTION;  Surgeon: [name], MD;  Location: [facility];  Service: Obstetrics;  Laterality: N/A;  . CESAREAN SECTION    . MYRINGOTOMY WITH TUBE PLACEMENT    . NO PAST SURGERIES    . TYMPANOSTOMY TUBE PLACEMENT    . UPPER GI ENDOSCOPY     Allergies  Allergen Reactions  . Ciprofloxacin Nausea Only  . Sulfa Antibiotics Diarrhea and Nausea And Vomiting  . Buspar [Buspirone] Anxiety  . Levaquin [Levofloxacin] Anxiety   No current facility-administered medications on file prior to encounter.    Current Outpatient Medications on File Prior to Encounter  Medication Sig Dispense Refill  . levothyroxine (SYNTHROID) 100 MCG tablet Take 75 mcg by mouth daily.    Marland Kitchen albuterol (VENTOLIN HFA) 108 (90 Base) MCG/ACT inhaler Inhale 2 puffs into the lungs every 4 (four) hours as needed for wheezing or shortness of breath. 1 Inhaler 1  . [START ON 07/20/2019] ALPRAZolam (XANAX) 1 MG tablet 0.5-1 mg daily as needed for anxiety 30 tablet 0  . citalopram (CELEXA) 40 MG tablet Take 1 tablet (40 mg total) by mouth daily. 90 tablet 0  . dexamethasone (DECADRON) 4 MG tablet Take 1 tablet (4 mg total) by mouth 2 (two) times daily with a  meal. (Patient not taking: Reported on 07/10/2019) 10 tablet 0  . Diphenhyd-Hydrocort-Nystatin (FIRST-DUKES MOUTHWASH) SUSP One tablespoon swish and spit QID 16 mL 0  . fexofenadine (ALLEGRA) 180 MG tablet Take 180 mg by mouth daily as needed for allergies.     Marland Kitchen ibuprofen (ADVIL) 200 MG tablet Take 800 mg by mouth every 6 (six) hours as needed for mild pain or moderate pain.    Marland Kitchen levothyroxine (SYNTHROID, LEVOTHROID) 75 MCG tablet Take 1 tablet (75 mcg total) by mouth daily. 30 tablet 2  . Lidocaine HCl (ASPERCREME LIDOCAINE) 4 % LIQD Apply 1 application topically daily as needed (for back pain).    Marland Kitchen lisdexamfetamine (VYVANSE) 30 MG capsule Take 1 capsule (30 mg total) by mouth daily. 30 capsule 0  . [START ON 08/10/2019] lisdexamfetamine (VYVANSE) 30 MG capsule Take 1 capsule (30 mg total) by mouth daily. (Patient not taking: Reported on 07/10/2019) 30 capsule 0   Social History   Socioeconomic History  . Marital status: Single    Spouse name: Not on file  . Number of children: Not on file  . Years of education: Not on file  . Highest education level: Not on file  Occupational History  . Not on file  Social Needs  . Financial resource strain: Not on file  . Food insecurity    Worry: Not on file  Inability: Not on file  . Transportation needs    Medical: Not on file    Non-medical: Not on file  Tobacco Use  . Smoking status: Current Every Day Smoker    Packs/day: 0.50    Types: Cigarettes    Start date: 01/28/2013  . Smokeless tobacco: Never Used  Substance and Sexual Activity  . Alcohol use: No    Alcohol/week: 0.0 standard drinks  . Drug use: No  . Sexual activity: Yes    Partners: Male    Birth control/protection: None  Lifestyle  . Physical activity    Days per week: Not on file    Minutes per session: Not on file  . Stress: Not on file  Relationships  . Social Musicianconnections    Talks on phone: Not on file    Gets together: Not on file    Attends religious service:  Not on file    Active member of club or organization: Not on file    Attends meetings of clubs or organizations: Not on file    Relationship status: Not on file  . Intimate partner violence    Fear of current or ex partner: Not on file    Emotionally abused: Not on file    Physically abused: Not on file    Forced sexual activity: Not on file  Other Topics Concern  . Not on file  Social History Narrative   ** Merged History Encounter **       Family History  Problem Relation Age of Onset  . Cancer Paternal Uncle   . COPD Maternal Grandfather   . Stroke Paternal Grandfather   . Depression Mother   . OCD Mother   . Bipolar disorder Paternal Aunt   . Drug abuse Maternal Uncle   . Depression Maternal Grandmother   . Anxiety disorder Maternal Grandmother     OBJECTIVE:  There were no vitals filed for this visit.   General appearance: alert; no distress Skin: approximately 3x3 cm area of induration of her distal left intergluteal cleft, overlying erythema; tender to touch; no active drainage Psychological: alert and cooperative; normal mood and affect  Procedure: Verbal consent obtained. Area over induration cleaned with betadine. Lidocaine 2% with epinephrine used to obtain local anesthesia. The most fluctuant portion of the abscess was incised with a #11 blade scalpel. Abscess cavity explored and evacuated. Loculations broken up with a curved hemostat as best as possible given patient discomfort. Cavity packed with packing material and dressed with a clean gauze dressing. Minimal bleeding. No complications.  ASSESSMENT & PLAN:  1. Left buttock abscess   2. Encounter for incision and drainage procedure     Meds ordered this encounter  Medications  . amoxicillin-clavulanate (AUGMENTIN) 875-125 MG tablet    Sig: Take 1 tablet by mouth every 12 (twelve) hours for 10 days.    Dispense:  20 tablet    Refill:  0    Order Specific Question:   Supervising Provider    Answer:    Eustace MooreELSON, YVONNE SUE [[id number]]    Abscess with incision and drainage:  Keep dry and covered for next 24-48 hours Remove packing in 48 hours either at home or return here After packing is removed in you may then begin appling warm compresses 3-4x daily for 10-15 minutes.  You may then wash site daily with warm water and mild soap Keep covered to avoid friction Take antibiotic as prescribed and to completion Return sooner or go to the ED if  you have any new or worsening symptoms such as increased redness, swelling, pain, nausea, vomiting, fever, chills, etc...   Reviewed expectations re: course of current medical issues. Questions answered. Outlined signs and symptoms indicating need for more acute intervention. Patient verbalized understanding. After Visit Summary given.          Lestine Box, PA-C 07/19/19 1738

## 2019-08-15 NOTE — Progress Notes (Deleted)
[facility] MD/PA/NP OP Progress Note  08/15/2019 10:55 AM Linda [name]  MRN:  [id number]  Chief Complaint:  HPI: *** Visit Diagnosis: No diagnosis found.  Past Psychiatric History: Please see initial evaluation for full details. I have reviewed the history. No updates at this time.     Past Medical History:  Past Medical History:  Diagnosis Date  . Anxiety   . Asthma   . Heart murmur   . IBS (irritable bowel syndrome)   . Interstitial cystitis     Past Surgical History:  Procedure Laterality Date  . CESAREAN SECTION N/A 03/20/2013   Procedure: CESAREAN SECTION;  Surgeon: [name], MD;  Location: [facility];  Service: Obstetrics;  Laterality: N/A;  . CESAREAN SECTION    . MYRINGOTOMY WITH TUBE PLACEMENT    . NO PAST SURGERIES    . TYMPANOSTOMY TUBE PLACEMENT    . UPPER GI ENDOSCOPY      Family Psychiatric History: Please see initial evaluation for full details. I have reviewed the history. No updates at this time.     Family History:  Family History  Problem Relation Age of Onset  . Cancer Paternal Uncle   . COPD Maternal Grandfather   . Stroke Paternal Grandfather   . Depression Mother   . OCD Mother   . Bipolar disorder Paternal Aunt   . Drug abuse Maternal Uncle   . Depression Maternal Grandmother   . Anxiety disorder Maternal Grandmother     Social History:  Social History   Socioeconomic History  . Marital status: Single    Spouse name: Not on file  . Number of children: Not on file  . Years of education: Not on file  . Highest education level: Not on file  Occupational History  . Not on file  Social Needs  . Financial resource strain: Not on file  . Food insecurity    Worry: Not on file    Inability: Not on file  . Transportation needs    Medical: Not on file    Non-medical: Not on file  Tobacco Use  . Smoking status: Current Every Day Smoker    Packs/day: 0.50    Types: Cigarettes    Start date: 01/28/2013  . Smokeless tobacco: Never  Used  Substance and Sexual Activity  . Alcohol use: No    Alcohol/week: 0.0 standard drinks  . Drug use: No  . Sexual activity: Yes    Partners: Male    Birth control/protection: None  Lifestyle  . Physical activity    Days per week: Not on file    Minutes per session: Not on file  . Stress: Not on file  Relationships  . Social Musician on phone: Not on file    Gets together: Not on file    Attends religious service: Not on file    Active member of club or organization: Not on file    Attends meetings of clubs or organizations: Not on file    Relationship status: Not on file  Other Topics Concern  . Not on file  Social History Narrative   ** Merged History Encounter **        Allergies:  Allergies  Allergen Reactions  . Ciprofloxacin Nausea Only  . Sulfa Antibiotics Diarrhea and Nausea And Vomiting  . Buspar [Buspirone] Anxiety  . Levaquin [Levofloxacin] Anxiety    Metabolic Disorder Labs: Lab Results  Component Value Date   HGBA1C 5.0 11/24/2016   No results  found for: PROLACTIN No results found for: CHOL, TRIG, HDL, CHOLHDL, VLDL, LDLCALC Lab Results  Component Value Date   TSH 28.86 (H) 07/26/2018   TSH 4.370 11/24/2016    Therapeutic Level Labs: No results found for: LITHIUM No results found for: VALPROATE No components found for:  CBMZ  Current Medications: Current Outpatient Medications  Medication Sig Dispense Refill  . albuterol (VENTOLIN HFA) 108 (90 Base) MCG/ACT inhaler Inhale 2 puffs into the lungs every 4 (four) hours as needed for wheezing or shortness of breath. 1 Inhaler 1  . ALPRAZolam (XANAX) 1 MG tablet 0.5-1 mg daily as needed for anxiety 30 tablet 0  . citalopram (CELEXA) 40 MG tablet Take 1 tablet (40 mg total) by mouth daily. 90 tablet 0  . dexamethasone (DECADRON) 4 MG tablet Take 1 tablet (4 mg total) by mouth 2 (two) times daily with a meal. (Patient not taking: Reported on 07/10/2019) 10 tablet 0  .  Diphenhyd-Hydrocort-Nystatin (FIRST-DUKES MOUTHWASH) SUSP One tablespoon swish and spit QID 16 mL 0  . fexofenadine (ALLEGRA) 180 MG tablet Take 180 mg by mouth daily as needed for allergies.     Marland Kitchen ibuprofen (ADVIL) 200 MG tablet Take 800 mg by mouth every 6 (six) hours as needed for mild pain or moderate pain.    Marland Kitchen levothyroxine (SYNTHROID) 100 MCG tablet Take 75 mcg by mouth daily.    Marland Kitchen levothyroxine (SYNTHROID, LEVOTHROID) 75 MCG tablet Take 1 tablet (75 mcg total) by mouth daily. 30 tablet 2  . Lidocaine HCl (ASPERCREME LIDOCAINE) 4 % LIQD Apply 1 application topically daily as needed (for back pain).    Marland Kitchen lisdexamfetamine (VYVANSE) 30 MG capsule Take 1 capsule (30 mg total) by mouth daily. 30 capsule 0  . lisdexamfetamine (VYVANSE) 30 MG capsule Take 1 capsule (30 mg total) by mouth daily. (Patient not taking: Reported on 07/10/2019) 30 capsule 0   No current facility-administered medications for this visit.      Musculoskeletal: Strength & Muscle Tone: N/A Gait & Station: N/A Patient leans: N/A  Psychiatric Specialty Exam: ROS  There were no vitals taken for this visit.There is no height or weight on file to calculate BMI.  General Appearance: {Appearance:22683}  Eye Contact:  {BHH EYE CONTACT:22684}  Speech:  Clear and Coherent  Volume:  Normal  Mood:  {BHH MOOD:22306}  Affect:  {Affect (PAA):22687}  Thought Process:  Coherent  Orientation:  Full (Time, Place, and Person)  Thought Content: Logical   Suicidal Thoughts:  {ST/HT (PAA):22692}  Homicidal Thoughts:  {ST/HT (PAA):22692}  Memory:  Immediate;   Good  Judgement:  {Judgement (PAA):22694}  Insight:  {Insight (PAA):22695}  Psychomotor Activity:  Normal  Concentration:  Concentration: Good and Attention Span: Good  Recall:  Good  Fund of Knowledge: Good  Language: Good  Akathisia:  No  Handed:  Right  AIMS (if indicated): N/A  Assets:  Communication Skills Desire for Improvement  ADL's:  Intact  Cognition: WNL   Sleep:  {BHH GOOD/FAIR/POOR:22877}   Screenings: GAD-7     Office Visit from 03/07/2018 in [place] Office Visit from 11/22/2017 in [place]  Total GAD-7 Score  14  15    PHQ2-9     Office Visit from 03/07/2018 in [facility]  PHQ-2 Total Score  5  PHQ-9 Total Score  18       Assessment and Plan:  Linda [name] is a 28 y.o. year old female with a history of mood disorder, anxiety, who  presents for follow up appointment for No diagnosis found.  # MDD in partial remission # Generalized anxiety disorder # Panic disorder  She denies significant mood symptoms except occasional anxiety in the context of her son starting online school.  Will continue citalopram to target depression and anxiety.  Will continue Xanax as needed for anxiety.  Noted that although she reports history of hypomanic symptoms in the past (increased sexual activity, talkativeness, euphoria, excessive shopping),it was in the context of the father of her son suddenly left the house.Will continue to monitor.    # ADHD (Patient has history of ADHD since age 28. She reportedly underwent neuropsychological evaluation) She reports more difficulty in attention since her son starting school.  Will uptitrate Vyvanse to target ADHD.  Discussed potential side effect, which includes insomnia, decreased appetite, worsening anxiety, hypertension and headache.  Noted that she started on bupropion for smoking cessation; she is advised to hold this medication at this time to avoid potential side effect.  She agrees to try nicotine patch if that is applicable.   Plan  1. Continue citalopram 40 mg daily  2. Increase Vyvanse 30 mg daily  3. Hold bupropion (she was prescribed for smoking cessation) 4. Continue Xanax 1 mg daily as needed for anxiety- uses it a few times per week 06/19/2019  5. Next appointment: 10/7 at 10:20 for 20 mins, video  Past trials of  medication:citalopram, lithium, Depakote, Abilify (visual impairment),xanax, ativan, clonazepam, adderall,Ritalin, Concerta,  The patient demonstrates the following risk factors for suicide: Chronic risk factors for suicide include:psychiatric disorder ofanxiety. Acute risk factorsfor suicide include: N/A. Protective factorsfor this patient include: positive social support, responsibility to others (children, family), coping skills and hope for the future. Considering these factors, the overall suicide  Neysa Hottereina Corynn Solberg, MD 08/15/2019, 10:55 AM

## 2019-08-20 ENCOUNTER — Other Ambulatory Visit: Payer: Self-pay | Admitting: Family Medicine

## 2019-08-20 NOTE — Telephone Encounter (Signed)
Ok plus two ref 

## 2019-08-22 ENCOUNTER — Ambulatory Visit (HOSPITAL_COMMUNITY): Payer: Medicaid Other | Admitting: Psychiatry

## 2019-08-22 ENCOUNTER — Other Ambulatory Visit: Payer: Self-pay

## 2019-08-22 ENCOUNTER — Telehealth (HOSPITAL_COMMUNITY): Payer: Self-pay | Admitting: Psychiatry

## 2019-08-22 NOTE — Telephone Encounter (Signed)
Sent link for video visit through [employer] [facility]. Patient did not sign in. Called the patient  twice for appointment scheduled today. The patient did not answer the phone. Left voice message to contact the office.  

## 2019-09-10 DIAGNOSIS — N926 Irregular menstruation, unspecified: Secondary | ICD-10-CM | POA: Diagnosis not present

## 2019-09-19 DIAGNOSIS — E038 Other specified hypothyroidism: Secondary | ICD-10-CM | POA: Diagnosis not present

## 2019-09-19 DIAGNOSIS — E063 Autoimmune thyroiditis: Secondary | ICD-10-CM | POA: Diagnosis not present

## 2019-09-24 ENCOUNTER — Encounter: Payer: Self-pay | Admitting: Family Medicine

## 2019-09-24 ENCOUNTER — Other Ambulatory Visit: Payer: Self-pay

## 2019-09-24 ENCOUNTER — Ambulatory Visit (INDEPENDENT_AMBULATORY_CARE_PROVIDER_SITE_OTHER): Payer: Medicaid Other | Admitting: Family Medicine

## 2019-09-24 DIAGNOSIS — J4521 Mild intermittent asthma with (acute) exacerbation: Secondary | ICD-10-CM | POA: Diagnosis not present

## 2019-09-24 DIAGNOSIS — J019 Acute sinusitis, unspecified: Secondary | ICD-10-CM

## 2019-09-24 MED ORDER — AMOXICILLIN-POT CLAVULANATE 875-125 MG PO TABS: ORAL_TABLET | ORAL | 0 refills | Status: DC

## 2019-09-24 MED ORDER — PREDNISONE 20 MG PO TABS: ORAL_TABLET | ORAL | 0 refills | Status: DC

## 2019-09-24 NOTE — Progress Notes (Signed)
   Subjective:    Patient ID: Linda [name], female    DOB: 1990-12-07, 28 y.o.   MRN: [id number]  Sinusitis This is a new problem. The current episode started in the past 7 days. Associated symptoms include congestion and coughing. Pertinent negatives include no ear pain or shortness of breath. (Sinus pressure) Treatments tried: Mucinex, Tylenol  The treatment provided no relief.  Patient with a fair amount of head congestion drainage sinus pressure little bit of sore throat just not feeling well denies high fever chills sweats no shortness of breath but having some intermittent coughing and wheezing started off in her head moved into her chest I did encourage patient to do Covid testing this week Virtual Visit via Video Note  I connected with [name] on 09/24/19 at  1:10 PM EST by a video enabled telemedicine application and verified that I am speaking with the correct person using two identifiers.  Location: Patient: home Provider: office   I discussed the limitations of evaluation and management by telemedicine and the availability of in person appointments. The patient expressed understanding and agreed to proceed.  History of Present Illness:    Observations/Objective:   Assessment and Plan:   Follow Up Instructions:    I discussed the assessment and treatment plan with the patient. The patient was provided an opportunity to ask questions and all were answered. The patient agreed with the plan and demonstrated an understanding of the instructions.   The patient was advised to call back or seek an in-person evaluation if the symptoms worsen or if the condition fails to improve as anticipated.  I provided 15 minutes of non-face-to-face time during this encounter.   Vicente Males, LPN    Review of Systems  Constitutional: Negative for activity change and fever.  HENT: Positive for congestion and rhinorrhea. Negative for ear pain.   Eyes: Negative for  discharge.  Respiratory: Positive for cough. Negative for shortness of breath and wheezing.   Cardiovascular: Negative for chest pain.       Objective:   Physical Exam   Patient had virtual visit Appears to be in no distress Atraumatic Neuro able to relate and oriented No apparent resp distress Color normal      Assessment & Plan:  Acute rhinosinusitis Antibiotics prescribed warning signs discussed Also recommend Covid testing Work excuse for the rest of this week If progressive symptoms or if worse immediately go to ER Reactive airway Short course of prednisone as well albuterol as needed

## 2019-10-04 ENCOUNTER — Other Ambulatory Visit: Payer: Self-pay

## 2019-10-04 ENCOUNTER — Ambulatory Visit: Payer: Medicaid Other | Admitting: Psychiatry

## 2019-10-05 ENCOUNTER — Telehealth: Payer: Self-pay

## 2019-10-05 MED ORDER — ALPRAZOLAM 1 MG PO TABS: 1.0000 mg | ORAL_TABLET | Freq: Every day | ORAL | 0 refills | Status: DC | PRN

## 2019-10-05 MED ORDER — LISDEXAMFETAMINE DIMESYLATE 30 MG PO CAPS: 30.0000 mg | ORAL_CAPSULE | Freq: Every day | ORAL | 0 refills | Status: DC

## 2019-10-05 MED ORDER — CITALOPRAM HYDROBROMIDE 40 MG PO TABS: 40.0000 mg | ORAL_TABLET | Freq: Every day | ORAL | 0 refills | Status: DC

## 2019-10-05 NOTE — Telephone Encounter (Signed)
pt states she had appt with you yesterday but on one call her states she needs a refill on her medications.

## 2019-10-05 NOTE — Telephone Encounter (Signed)
I had no idea that she was on my schedule yesterday. I am seeing for the first time today after your message that she was on my schedule at 4 pm. I have no clue. Anyways, does she want refills only ? I am sending refills for 3 months now. She will need appt with Dr. Modesta Messing in February.  Thanks.

## 2019-10-12 ENCOUNTER — Other Ambulatory Visit: Payer: Self-pay

## 2019-10-12 ENCOUNTER — Ambulatory Visit (INDEPENDENT_AMBULATORY_CARE_PROVIDER_SITE_OTHER): Payer: Medicaid Other | Admitting: Family Medicine

## 2019-10-12 DIAGNOSIS — J019 Acute sinusitis, unspecified: Secondary | ICD-10-CM

## 2019-10-12 DIAGNOSIS — B9689 Other specified bacterial agents as the cause of diseases classified elsewhere: Secondary | ICD-10-CM | POA: Diagnosis not present

## 2019-10-12 DIAGNOSIS — J683 Other acute and subacute respiratory conditions due to chemicals, gases, fumes and vapors: Secondary | ICD-10-CM

## 2019-10-12 MED ORDER — FLOVENT HFA 44 MCG/ACT IN AERO: 2.0000 | INHALATION_SPRAY | Freq: Two times a day (BID) | RESPIRATORY_TRACT | 0 refills | Status: DC

## 2019-10-12 MED ORDER — PREDNISONE 20 MG PO TABS: ORAL_TABLET | ORAL | 0 refills | Status: DC

## 2019-10-12 MED ORDER — CEFDINIR 300 MG PO CAPS: 300.0000 mg | ORAL_CAPSULE | Freq: Two times a day (BID) | ORAL | 0 refills | Status: DC

## 2019-10-12 NOTE — Progress Notes (Signed)
   Subjective:  Audio plus video  Patient ID: Linda [name], female    DOB: 03-21-1991, 28 y.o.   MRN: [id number]  Cough This is a new problem. The current episode started 1 to 4 weeks ago. Associated symptoms include nasal congestion, shortness of breath and wheezing. Treatments tried: antibiotic.    pt had coro test last week was negative   Review of Systems  Respiratory: Positive for cough, shortness of breath and wheezing.    Virtual Visit via Video Note  I connected with [name] on 10/12/19 at 10:00 AM EST by a video enabled telemedicine application and verified that I am speaking with the correct person using two identifiers.  Location: Patient: Home Provider: Office   I discussed the limitations of evaluation and management by telemedicine and the availability of in person appointments. The patient expressed understanding and agreed to proceed.  History of Present Illness:    Observations/Objective:   Assessment and Plan:   Follow Up Instructions:    I discussed the assessment and treatment plan with the patient. The patient was provided an opportunity to ask questions and all were answered. The patient agreed with the plan and demonstrated an understanding of the instructions.   The patient was advised to call back or seek an in-person evaluation if the symptoms worsen or if the condition fails to improve as anticipated.  I provided 18 minutes of non-face-to-face time during this encounter.  Persistent congestion in the chest.  Cough productive at times.  Substantial wheeziness.  Unfortunately smokes.  Improved with last round of antibiotics but now symptoms are aggravating again      Objective:   Physical Exam   Virtual     Assessment & Plan:  Impression sinusitis with bronchitis and exacerbation of asthma.  Ongoing need for albuterol multiple times per day.  Antibiotics per steroid taper plus steroid inhaler over the next couple months  rationale discussed encouraged to stop smoking

## 2019-10-16 ENCOUNTER — Encounter (HOSPITAL_COMMUNITY)
Admission: EM | Disposition: A | Discharge status: Home / Self Care | Payer: Self-pay | Source: Home / Self Care | Attending: Emergency Medicine

## 2019-10-16 ENCOUNTER — Emergency Department (HOSPITAL_COMMUNITY): Payer: Medicaid Other | Admitting: Anesthesiology

## 2019-10-16 ENCOUNTER — Observation Stay (HOSPITAL_COMMUNITY)
Admission: EM | Admit: 2019-10-16 | Discharge: 2019-10-17 | Disposition: A | Discharge status: Home / Self Care | Payer: Medicaid Other | Attending: General Surgery | Admitting: General Surgery

## 2019-10-16 ENCOUNTER — Other Ambulatory Visit: Payer: Self-pay

## 2019-10-16 ENCOUNTER — Emergency Department (HOSPITAL_COMMUNITY): Payer: Medicaid Other

## 2019-10-16 ENCOUNTER — Ambulatory Visit (INDEPENDENT_AMBULATORY_CARE_PROVIDER_SITE_OTHER)
Admission: EM | Admit: 2019-10-16 | Discharge: 2019-10-16 | Disposition: A | Discharge status: Home / Self Care | Payer: Medicaid Other | Source: Home / Self Care

## 2019-10-16 ENCOUNTER — Encounter (HOSPITAL_COMMUNITY): Payer: Self-pay | Admitting: Emergency Medicine

## 2019-10-16 DIAGNOSIS — F17210 Nicotine dependence, cigarettes, uncomplicated: Secondary | ICD-10-CM | POA: Insufficient documentation

## 2019-10-16 DIAGNOSIS — K589 Irritable bowel syndrome without diarrhea: Secondary | ICD-10-CM | POA: Insufficient documentation

## 2019-10-16 DIAGNOSIS — F411 Generalized anxiety disorder: Secondary | ICD-10-CM | POA: Diagnosis not present

## 2019-10-16 DIAGNOSIS — J45909 Unspecified asthma, uncomplicated: Secondary | ICD-10-CM | POA: Insufficient documentation

## 2019-10-16 DIAGNOSIS — Z20828 Contact with and (suspected) exposure to other viral communicable diseases: Secondary | ICD-10-CM | POA: Insufficient documentation

## 2019-10-16 DIAGNOSIS — Z23 Encounter for immunization: Secondary | ICD-10-CM | POA: Diagnosis not present

## 2019-10-16 DIAGNOSIS — R1031 Right lower quadrant pain: Secondary | ICD-10-CM

## 2019-10-16 DIAGNOSIS — R1011 Right upper quadrant pain: Secondary | ICD-10-CM | POA: Diagnosis not present

## 2019-10-16 DIAGNOSIS — Z793 Long term (current) use of hormonal contraceptives: Secondary | ICD-10-CM | POA: Diagnosis not present

## 2019-10-16 DIAGNOSIS — F988 Other specified behavioral and emotional disorders with onset usually occurring in childhood and adolescence: Secondary | ICD-10-CM | POA: Insufficient documentation

## 2019-10-16 DIAGNOSIS — E039 Hypothyroidism, unspecified: Secondary | ICD-10-CM | POA: Diagnosis not present

## 2019-10-16 DIAGNOSIS — Z9049 Acquired absence of other specified parts of digestive tract: Secondary | ICD-10-CM

## 2019-10-16 DIAGNOSIS — R062 Wheezing: Secondary | ICD-10-CM

## 2019-10-16 DIAGNOSIS — K429 Umbilical hernia without obstruction or gangrene: Secondary | ICD-10-CM | POA: Diagnosis not present

## 2019-10-16 DIAGNOSIS — K760 Fatty (change of) liver, not elsewhere classified: Secondary | ICD-10-CM | POA: Insufficient documentation

## 2019-10-16 DIAGNOSIS — N83201 Unspecified ovarian cyst, right side: Secondary | ICD-10-CM

## 2019-10-16 DIAGNOSIS — Z79890 Hormone replacement therapy (postmenopausal): Secondary | ICD-10-CM | POA: Diagnosis not present

## 2019-10-16 DIAGNOSIS — N83291 Other ovarian cyst, right side: Secondary | ICD-10-CM | POA: Diagnosis not present

## 2019-10-16 DIAGNOSIS — Z6837 Body mass index (BMI) 37.0-37.9, adult: Secondary | ICD-10-CM | POA: Diagnosis not present

## 2019-10-16 DIAGNOSIS — Z79899 Other long term (current) drug therapy: Secondary | ICD-10-CM | POA: Insufficient documentation

## 2019-10-16 DIAGNOSIS — K3580 Unspecified acute appendicitis: Secondary | ICD-10-CM | POA: Diagnosis not present

## 2019-10-16 DIAGNOSIS — Z7951 Long term (current) use of inhaled steroids: Secondary | ICD-10-CM | POA: Insufficient documentation

## 2019-10-16 HISTORY — PX: LAPAROSCOPIC APPENDECTOMY: SHX408

## 2019-10-16 LAB — COMPREHENSIVE METABOLIC PANEL
ALT: 24 U/L (ref 0–44)
AST: 19 U/L (ref 15–41)
Albumin: 4.4 g/dL (ref 3.5–5.0)
Alkaline Phosphatase: 99 U/L (ref 38–126)
Anion gap: 12 (ref 5–15)
BUN: 10 mg/dL (ref 6–20)
CO2: 23 mmol/L (ref 22–32)
Calcium: 9.5 mg/dL (ref 8.9–10.3)
Chloride: 103 mmol/L (ref 98–111)
Creatinine, Ser: 0.7 mg/dL (ref 0.44–1.00)
GFR calc Af Amer: >60 mL/min (ref >60)
GFR calc non Af Amer: >60 mL/min (ref >60)
Glucose, Bld: 110 mg/dL — ABNORMAL HIGH (ref 70–99)
Potassium: 4.1 mmol/L (ref 3.5–5.1)
Sodium: 138 mmol/L (ref 135–145)
Total Bilirubin: 0.4 mg/dL (ref 0.3–1.2)
Total Protein: 8.4 g/dL — ABNORMAL HIGH (ref 6.5–8.1)

## 2019-10-16 LAB — SARS CORONAVIRUS 2 BY RT PCR (HOSPITAL ORDER, PERFORMED IN ~~LOC~~ HOSPITAL LAB): SARS Coronavirus 2: NEGATIVE

## 2019-10-16 LAB — CBC WITH DIFFERENTIAL/PLATELET
Abs Immature Granulocytes: 0.06 10*3/uL (ref 0.00–0.07)
Basophils Absolute: 0.1 10*3/uL (ref 0.0–0.1)
Basophils Relative: 0 %
Eosinophils Absolute: 0.2 10*3/uL (ref 0.0–0.5)
Eosinophils Relative: 1 %
HCT: 44 % (ref 36.0–46.0)
Hemoglobin: 13.9 g/dL (ref 12.0–15.0)
Immature Granulocytes: 0 %
Lymphocytes Relative: 11 %
Lymphs Abs: 1.6 10*3/uL (ref 0.7–4.0)
MCH: 28.8 pg (ref 26.0–34.0)
MCHC: 31.6 g/dL (ref 30.0–36.0)
MCV: 91.1 fL (ref 80.0–100.0)
Monocytes Absolute: 0.6 10*3/uL (ref 0.1–1.0)
Monocytes Relative: 4 %
Neutro Abs: 12.6 10*3/uL — ABNORMAL HIGH (ref 1.7–7.7)
Neutrophils Relative %: 84 %
Platelets: 360 10*3/uL (ref 150–400)
RBC: 4.83 MIL/uL (ref 3.87–5.11)
RDW: 13.2 % (ref 11.5–15.5)
WBC: 15.1 10*3/uL — ABNORMAL HIGH (ref 4.0–10.5)
nRBC: 0 % (ref 0.0–0.2)

## 2019-10-16 LAB — POCT URINALYSIS DIP (MANUAL ENTRY)
Bilirubin, UA: NEGATIVE
Glucose, UA: NEGATIVE mg/dL
Ketones, POC UA: NEGATIVE mg/dL
Nitrite, UA: NEGATIVE
Protein Ur, POC: NEGATIVE mg/dL
Spec Grav, UA: >=1.03 — AB (ref 1.010–1.025)
Urobilinogen, UA: 0.2 E.U./dL
pH, UA: 6 (ref 5.0–8.0)

## 2019-10-16 LAB — URINALYSIS, ROUTINE W REFLEX MICROSCOPIC
Bilirubin Urine: NEGATIVE
Glucose, UA: NEGATIVE mg/dL
Hgb urine dipstick: NEGATIVE
Ketones, ur: NEGATIVE mg/dL
Leukocytes,Ua: NEGATIVE
Nitrite: NEGATIVE
Protein, ur: NEGATIVE mg/dL
Specific Gravity, Urine: 1.019 (ref 1.005–1.030)
pH: 6 (ref 5.0–8.0)

## 2019-10-16 LAB — POC URINE PREG, ED: Preg Test, Ur: NEGATIVE

## 2019-10-16 LAB — LIPASE, BLOOD: Lipase: 20 U/L (ref 11–51)

## 2019-10-16 LAB — POCT URINE PREGNANCY: Preg Test, Ur: NEGATIVE

## 2019-10-16 SURGERY — APPENDECTOMY, LAPAROSCOPIC
Anesthesia: General | Site: Abdomen

## 2019-10-16 MED ORDER — BUPIVACAINE LIPOSOME 1.3 % IJ SUSP
INTRAMUSCULAR | Status: DC | PRN
  Administered 2019-10-16: 20 mL

## 2019-10-16 MED ORDER — INFLUENZA VAC SPLIT QUAD 0.5 ML IM SUSY
0.5000 mL | PREFILLED_SYRINGE | INTRAMUSCULAR | Status: AC
  Administered 2019-10-17: 0.5 mL via INTRAMUSCULAR
  Filled 2019-10-16: qty 0.5

## 2019-10-16 MED ORDER — SODIUM CHLORIDE 0.9 % IV SOLN: 2.0000 g | INTRAVENOUS | Status: DC

## 2019-10-16 MED ORDER — CITALOPRAM HYDROBROMIDE 20 MG PO TABS
40.0000 mg | ORAL_TABLET | Freq: Every day | ORAL | Status: DC
  Administered 2019-10-16: 40 mg via ORAL
  Filled 2019-10-16 (×2): qty 2

## 2019-10-16 MED ORDER — DIPHENHYDRAMINE HCL 25 MG PO CAPS: 25.0000 mg | ORAL_CAPSULE | Freq: Four times a day (QID) | ORAL | Status: DC | PRN

## 2019-10-16 MED ORDER — SUCCINYLCHOLINE CHLORIDE 20 MG/ML IJ SOLN
INTRAMUSCULAR | Status: DC | PRN
  Administered 2019-10-16: 130 mg via INTRAVENOUS

## 2019-10-16 MED ORDER — FENTANYL CITRATE (PF) 100 MCG/2ML IJ SOLN
INTRAMUSCULAR | Status: AC
  Filled 2019-10-16: qty 2

## 2019-10-16 MED ORDER — DEXAMETHASONE SODIUM PHOSPHATE 10 MG/ML IJ SOLN
INTRAMUSCULAR | Status: AC
  Filled 2019-10-16: qty 1

## 2019-10-16 MED ORDER — LIDOCAINE 2% (20 MG/ML) 5 ML SYRINGE
INTRAMUSCULAR | Status: AC
  Filled 2019-10-16: qty 5

## 2019-10-16 MED ORDER — ONDANSETRON HCL 4 MG/2ML IJ SOLN
4.0000 mg | Freq: Once | INTRAMUSCULAR | Status: AC
  Administered 2019-10-16: 14:00:00 4 mg via INTRAVENOUS
  Filled 2019-10-16: qty 2

## 2019-10-16 MED ORDER — HEMOSTATIC AGENTS (NO CHARGE) OPTIME
TOPICAL | Status: DC | PRN
  Administered 2019-10-16: 1 via TOPICAL

## 2019-10-16 MED ORDER — ONDANSETRON HCL 4 MG/2ML IJ SOLN
INTRAMUSCULAR | Status: AC
  Filled 2019-10-16: qty 2

## 2019-10-16 MED ORDER — MORPHINE SULFATE (PF) 4 MG/ML IV SOLN
4.0000 mg | Freq: Once | INTRAVENOUS | Status: AC
  Administered 2019-10-16: 4 mg via INTRAVENOUS
  Filled 2019-10-16: qty 1

## 2019-10-16 MED ORDER — BUPIVACAINE LIPOSOME 1.3 % IJ SUSP
INTRAMUSCULAR | Status: AC
  Filled 2019-10-16: qty 20

## 2019-10-16 MED ORDER — SUGAMMADEX SODIUM 500 MG/5ML IV SOLN
INTRAVENOUS | Status: DC | PRN
  Administered 2019-10-16: 208.6 mg via INTRAVENOUS

## 2019-10-16 MED ORDER — DEXAMETHASONE SODIUM PHOSPHATE 10 MG/ML IJ SOLN
INTRAMUSCULAR | Status: DC | PRN
  Administered 2019-10-16: 4 mg via INTRAVENOUS

## 2019-10-16 MED ORDER — FENTANYL CITRATE (PF) 250 MCG/5ML IJ SOLN
INTRAMUSCULAR | Status: AC
  Filled 2019-10-16: qty 5

## 2019-10-16 MED ORDER — KETOROLAC TROMETHAMINE 30 MG/ML IJ SOLN
INTRAMUSCULAR | Status: AC
  Filled 2019-10-16: qty 1

## 2019-10-16 MED ORDER — ENOXAPARIN SODIUM 40 MG/0.4ML ~~LOC~~ SOLN
40.0000 mg | SUBCUTANEOUS | Status: DC
  Administered 2019-10-17: 40 mg via SUBCUTANEOUS
  Filled 2019-10-16: qty 0.4

## 2019-10-16 MED ORDER — LORAZEPAM 2 MG/ML IJ SOLN: 1.0000 mg | INTRAMUSCULAR | Status: DC | PRN

## 2019-10-16 MED ORDER — ONDANSETRON 4 MG PO TBDP: 4.0000 mg | ORAL_TABLET | Freq: Four times a day (QID) | ORAL | Status: DC | PRN

## 2019-10-16 MED ORDER — ACETAMINOPHEN 325 MG PO TABS: 650.0000 mg | ORAL_TABLET | Freq: Four times a day (QID) | ORAL | Status: DC | PRN

## 2019-10-16 MED ORDER — ALBUTEROL SULFATE (2.5 MG/3ML) 0.083% IN NEBU: 3.0000 mL | INHALATION_SOLUTION | RESPIRATORY_TRACT | Status: DC | PRN

## 2019-10-16 MED ORDER — SODIUM CHLORIDE 0.9 % IV SOLN
INTRAVENOUS | Status: DC
  Administered 2019-10-16: 21:00:00 via INTRAVENOUS

## 2019-10-16 MED ORDER — SODIUM CHLORIDE 0.9 % IR SOLN
Status: DC | PRN
  Administered 2019-10-16: 1000 mL

## 2019-10-16 MED ORDER — KETOROLAC TROMETHAMINE 30 MG/ML IJ SOLN
30.0000 mg | Freq: Four times a day (QID) | INTRAMUSCULAR | Status: AC
  Administered 2019-10-16: 30 mg via INTRAVENOUS

## 2019-10-16 MED ORDER — METRONIDAZOLE IN NACL 5-0.79 MG/ML-% IV SOLN
500.0000 mg | Freq: Once | INTRAVENOUS | Status: AC
  Administered 2019-10-16: 500 mg via INTRAVENOUS
  Filled 2019-10-16: qty 100

## 2019-10-16 MED ORDER — ONDANSETRON HCL 4 MG/2ML IJ SOLN
INTRAMUSCULAR | Status: DC | PRN
  Administered 2019-10-16: 4 mg via INTRAVENOUS

## 2019-10-16 MED ORDER — FENTANYL CITRATE (PF) 100 MCG/2ML IJ SOLN
INTRAMUSCULAR | Status: DC | PRN
  Administered 2019-10-16: 50 ug via INTRAVENOUS
  Administered 2019-10-16 (×2): 100 ug via INTRAVENOUS

## 2019-10-16 MED ORDER — SODIUM CHLORIDE 0.9 % IV SOLN
INTRAVENOUS | Status: DC | PRN
  Administered 2019-10-16: 500 mL via INTRAVENOUS

## 2019-10-16 MED ORDER — LACTATED RINGERS IV SOLN
INTRAVENOUS | Status: DC | PRN
  Administered 2019-10-16: 19:00:00 via INTRAVENOUS

## 2019-10-16 MED ORDER — PROMETHAZINE HCL 25 MG/ML IJ SOLN: 6.2500 mg | INTRAMUSCULAR | Status: DC | PRN

## 2019-10-16 MED ORDER — HYDROCODONE-ACETAMINOPHEN 5-325 MG PO TABS: 1.0000 | ORAL_TABLET | ORAL | Status: DC | PRN

## 2019-10-16 MED ORDER — LEVOTHYROXINE SODIUM 25 MCG PO TABS
125.0000 ug | ORAL_TABLET | Freq: Every day | ORAL | Status: DC
  Administered 2019-10-17: 125 ug via ORAL
  Filled 2019-10-16: qty 1

## 2019-10-16 MED ORDER — DIPHENHYDRAMINE HCL 50 MG/ML IJ SOLN: 25.0000 mg | Freq: Four times a day (QID) | INTRAMUSCULAR | Status: DC | PRN

## 2019-10-16 MED ORDER — ROCURONIUM BROMIDE 10 MG/ML (PF) SYRINGE
PREFILLED_SYRINGE | INTRAVENOUS | Status: AC
  Filled 2019-10-16: qty 10

## 2019-10-16 MED ORDER — METRONIDAZOLE IN NACL 5-0.79 MG/ML-% IV SOLN
500.0000 mg | Freq: Three times a day (TID) | INTRAVENOUS | Status: DC
  Administered 2019-10-17 (×2): 500 mg via INTRAVENOUS
  Filled 2019-10-16 (×2): qty 100

## 2019-10-16 MED ORDER — MIDAZOLAM HCL 2 MG/2ML IJ SOLN
INTRAMUSCULAR | Status: AC
  Filled 2019-10-16: qty 2

## 2019-10-16 MED ORDER — ONDANSETRON 4 MG PO TBDP
4.0000 mg | ORAL_TABLET | Freq: Once | ORAL | Status: AC
  Administered 2019-10-16: 4 mg via ORAL

## 2019-10-16 MED ORDER — SODIUM CHLORIDE 0.9 % IV BOLUS
1000.0000 mL | Freq: Once | INTRAVENOUS | Status: AC
  Administered 2019-10-16: 14:00:00 1000 mL via INTRAVENOUS

## 2019-10-16 MED ORDER — SODIUM CHLORIDE 0.9 % IV SOLN
2.0000 g | Freq: Once | INTRAVENOUS | Status: AC
  Administered 2019-10-16: 2 g via INTRAVENOUS
  Filled 2019-10-16: qty 20

## 2019-10-16 MED ORDER — SIMETHICONE 80 MG PO CHEW: 40.0000 mg | CHEWABLE_TABLET | Freq: Four times a day (QID) | ORAL | Status: DC | PRN

## 2019-10-16 MED ORDER — PROPOFOL 10 MG/ML IV BOLUS
INTRAVENOUS | Status: DC | PRN
  Administered 2019-10-16: 200 mg via INTRAVENOUS

## 2019-10-16 MED ORDER — MORPHINE SULFATE (PF) 4 MG/ML IV SOLN
4.0000 mg | Freq: Once | INTRAVENOUS | Status: DC
  Filled 2019-10-16: qty 1

## 2019-10-16 MED ORDER — KETOROLAC TROMETHAMINE 30 MG/ML IJ SOLN
30.0000 mg | Freq: Four times a day (QID) | INTRAMUSCULAR | Status: DC | PRN
  Administered 2019-10-17 (×2): 30 mg via INTRAVENOUS
  Filled 2019-10-16 (×2): qty 1

## 2019-10-16 MED ORDER — LEVONORGEST-ETH ESTRAD 91-DAY 0.15-0.03 MG PO TABS: 1.0000 | ORAL_TABLET | Freq: Every day | ORAL | Status: DC

## 2019-10-16 MED ORDER — HYDROMORPHONE HCL 1 MG/ML IJ SOLN: 0.2500 mg | INTRAMUSCULAR | Status: DC | PRN

## 2019-10-16 MED ORDER — HYDROCODONE-ACETAMINOPHEN 7.5-325 MG PO TABS: 1.0000 | ORAL_TABLET | Freq: Once | ORAL | Status: DC | PRN

## 2019-10-16 MED ORDER — IOHEXOL 300 MG/ML  SOLN
100.0000 mL | Freq: Once | INTRAMUSCULAR | Status: AC | PRN
  Administered 2019-10-16: 100 mL via INTRAVENOUS

## 2019-10-16 MED ORDER — ACETAMINOPHEN 650 MG RE SUPP: 650.0000 mg | Freq: Four times a day (QID) | RECTAL | Status: DC | PRN

## 2019-10-16 MED ORDER — HYDROMORPHONE HCL 1 MG/ML IJ SOLN: 1.0000 mg | INTRAMUSCULAR | Status: DC | PRN

## 2019-10-16 MED ORDER — MIDAZOLAM HCL 2 MG/2ML IJ SOLN: 0.5000 mg | Freq: Once | INTRAMUSCULAR | Status: DC | PRN

## 2019-10-16 MED ORDER — PROPOFOL 10 MG/ML IV BOLUS
INTRAVENOUS | Status: AC
  Filled 2019-10-16: qty 40

## 2019-10-16 MED ORDER — ONDANSETRON HCL 4 MG/2ML IJ SOLN: 4.0000 mg | Freq: Once | INTRAMUSCULAR | Status: DC

## 2019-10-16 MED ORDER — ONDANSETRON HCL 4 MG/2ML IJ SOLN: 4.0000 mg | Freq: Four times a day (QID) | INTRAMUSCULAR | Status: DC | PRN

## 2019-10-16 MED ORDER — SUCCINYLCHOLINE CHLORIDE 200 MG/10ML IV SOSY
PREFILLED_SYRINGE | INTRAVENOUS | Status: AC
  Filled 2019-10-16: qty 10

## 2019-10-16 MED ORDER — MIDAZOLAM HCL 2 MG/2ML IJ SOLN
INTRAMUSCULAR | Status: DC | PRN
  Administered 2019-10-16: 2 mg via INTRAVENOUS

## 2019-10-16 SURGICAL SUPPLY — 60 items
ADH SKN CLS APL DERMABOND .7 (GAUZE/BANDAGES/DRESSINGS) ×1
APL PRP STRL LF DISP 70% ISPRP (MISCELLANEOUS) ×1
APL SRG 38 LTWT LNG FL B (MISCELLANEOUS) ×1
APPLICATOR ARISTA FLEXITIP XL (MISCELLANEOUS) ×2 IMPLANT
BAG RETRIEVAL 10 (BASKET) ×1
BAG RETRIEVAL 10MM (BASKET) ×1
CHLORAPREP W/TINT 26 (MISCELLANEOUS) ×3 IMPLANT
CLOTH BEACON ORANGE TIMEOUT ST (SAFETY) ×3 IMPLANT
COVER LIGHT HANDLE STERIS (MISCELLANEOUS) ×6 IMPLANT
COVER WAND RF STERILE (DRAPES) ×3 IMPLANT
CUTTER FLEX LINEAR 45M (STAPLE) ×3 IMPLANT
DECANTER SPIKE VIAL GLASS SM (MISCELLANEOUS) ×3 IMPLANT
DERMABOND ADVANCED (GAUZE/BANDAGES/DRESSINGS) ×2
DERMABOND ADVANCED .7 DNX12 (GAUZE/BANDAGES/DRESSINGS) ×1 IMPLANT
ELECT REM PT RETURN 9FT ADLT (ELECTROSURGICAL) ×3
ELECTRODE REM PT RTRN 9FT ADLT (ELECTROSURGICAL) ×1 IMPLANT
EVACUATOR SMOKE 8.L (FILTER) ×3 IMPLANT
GLOVE BIO SURGEON STRL SZ7 (GLOVE) ×2 IMPLANT
GLOVE BIOGEL PI IND STRL 7.0 (GLOVE) ×3 IMPLANT
GLOVE BIOGEL PI INDICATOR 7.0 (GLOVE) ×6
GLOVE SURG SS PI 7.5 STRL IVOR (GLOVE) ×3 IMPLANT
GOWN STRL REUS W/ TWL XL LVL3 (GOWN DISPOSABLE) ×1 IMPLANT
GOWN STRL REUS W/TWL LRG LVL3 (GOWN DISPOSABLE) ×3 IMPLANT
GOWN STRL REUS W/TWL XL LVL3 (GOWN DISPOSABLE) ×3
HEMOSTAT ARISTA ABSORB 1G (HEMOSTASIS) ×2 IMPLANT
INST SET LAPROSCOPIC AP (KITS) ×3 IMPLANT
IV NS IRRIG 3000ML ARTHROMATIC (IV SOLUTION) IMPLANT
KIT TURNOVER KIT A (KITS) ×3 IMPLANT
MANIFOLD NEPTUNE II (INSTRUMENTS) ×3 IMPLANT
NDL HYPO 18GX1.5 BLUNT FILL (NEEDLE) ×1 IMPLANT
NDL INSUFFLATION 14GA 120MM (NEEDLE) ×1 IMPLANT
NEEDLE HYPO 18GX1.5 BLUNT FILL (NEEDLE) ×3 IMPLANT
NEEDLE HYPO 22GX1.5 SAFETY (NEEDLE) ×3 IMPLANT
NEEDLE INSUFFLATION 14GA 120MM (NEEDLE) ×3 IMPLANT
NS IRRIG 1000ML POUR BTL (IV SOLUTION) ×3 IMPLANT
PACK LAP CHOLE LZT030E (CUSTOM PROCEDURE TRAY) ×3 IMPLANT
PAD ARMBOARD 7.5X6 YLW CONV (MISCELLANEOUS) ×3 IMPLANT
PENCIL HANDSWITCHING (ELECTRODE) ×3 IMPLANT
RELOAD 45 VASCULAR/THIN (ENDOMECHANICALS) IMPLANT
RELOAD STAPLE 45 2.5 WHT GRN (ENDOMECHANICALS) IMPLANT
RELOAD STAPLE 45 3.5 BLU ETS (ENDOMECHANICALS) IMPLANT
RELOAD STAPLE TA45 3.5 REG BLU (ENDOMECHANICALS) ×3 IMPLANT
SET BASIN LINEN APH (SET/KITS/TRAYS/PACK) ×3 IMPLANT
SET TUBE IRRIG SUCTION NO TIP (IRRIGATION / IRRIGATOR) IMPLANT
SET TUBE SMOKE EVAC HIGH FLOW (TUBING) ×3 IMPLANT
SHEARS HARMONIC ACE PLUS 36CM (ENDOMECHANICALS) ×3 IMPLANT
SUT MNCRL AB 4-0 PS2 18 (SUTURE) ×6 IMPLANT
SUT VICRYL 0 UR6 27IN ABS (SUTURE) ×3 IMPLANT
SYR 20ML LL LF (SYRINGE) ×6 IMPLANT
SYS BAG RETRIEVAL 10MM (BASKET) ×1
SYSTEM BAG RETRIEVAL 10MM (BASKET) ×1 IMPLANT
TRAY FOLEY W/BAG SLVR 16FR (SET/KITS/TRAYS/PACK) ×3
TRAY FOLEY W/BAG SLVR 16FR ST (SET/KITS/TRAYS/PACK) ×1 IMPLANT
TROCAR ENDO BLADELESS 11MM (ENDOMECHANICALS) ×3 IMPLANT
TROCAR ENDO BLADELESS 12MM (ENDOMECHANICALS) ×3 IMPLANT
TROCAR XCEL NON-BLD 5MMX100MML (ENDOMECHANICALS) ×3 IMPLANT
TUBE CONNECTING 12'X1/4 (SUCTIONS) ×1
TUBE CONNECTING 12X1/4 (SUCTIONS) ×2 IMPLANT
WARMER LAPAROSCOPE (MISCELLANEOUS) ×3 IMPLANT
YANKAUER SUCT 12FT TUBE ARGYLE (SUCTIONS) ×3 IMPLANT

## 2019-10-16 NOTE — ED Notes (Signed)
ED Provider at bedside. 

## 2019-10-16 NOTE — ED Provider Notes (Addendum)
[facility]   [id number] 10/16/19 Arrival Time: 1036  CC: ABDOMINAL DISCOMFORT  SUBJECTIVE:  Linda [name] is a 28 y.o. female who presents with complaint of abdominal discomfort that began 1 week ago.   Denies a precipitating event, trauma, close contacts with similar symptoms, recent travel.  Does admit to recent augmentin use prior to symptoms.  Localizes pain to epigastric region.  Describes as worsening, constant and sharp in character.  8/10.  Has tried OTC medications including ibuprofen/ tyenol without relief.  Denies alleviating factors.  Worse with eating.  Denies similar symptoms in the past.  Last BM this morning with looser stools that were yellow in color.  Complains of decreased appetite, nausea, and three episodes of vomiting this morning.    Denies fever, chills, weight changes, chest pain, SOB, diarrhea, constipation, hematochezia, melena, dysuria, difficulty urinating, increased frequency or urgency, flank pain, loss of bowel or bladder function, vaginal discharge, vaginal odor, vaginal bleeding, dyspareunia, pelvic pain.     Patient's last menstrual period was 07/11/2019 (approximate). Last sex 2 days ago.  Is not on BC.  Does not use protection/ condoms.    Incidentally wheezing heard over LT lung field on exam.  Patient reports hx of asthma and has been using inhaler more frequently.    ROS: As per HPI.  All other pertinent ROS negative.     Past Medical History:  Diagnosis Date  . Anxiety   . Asthma   . Heart murmur   . IBS (irritable bowel syndrome)   . Interstitial cystitis    Past Surgical History:  Procedure Laterality Date  . CESAREAN SECTION N/A 03/20/2013   Procedure: CESAREAN SECTION;  Surgeon: Logan Bores, MD;  Location: [facility];  Service: Obstetrics;  Laterality: N/A;  . CESAREAN SECTION    . MYRINGOTOMY WITH TUBE PLACEMENT    . NO PAST SURGERIES    . TYMPANOSTOMY TUBE PLACEMENT    . UPPER GI ENDOSCOPY     Allergies  Allergen  Reactions  . Ciprofloxacin Nausea Only  . Sulfa Antibiotics Diarrhea and Nausea And Vomiting  . Buspar [Buspirone] Anxiety  . Levaquin [Levofloxacin] Anxiety   No current facility-administered medications on file prior to encounter.    Current Outpatient Medications on File Prior to Encounter  Medication Sig Dispense Refill  . albuterol (VENTOLIN HFA) 108 (90 Base) MCG/ACT inhaler Inhale 2 puffs into the lungs every 4 (four) hours as needed for wheezing or shortness of breath. 1 Inhaler 1  . ALPRAZolam (XANAX) 1 MG tablet 0.5-1 mg daily as needed for anxiety 30 tablet 0  . ALPRAZolam (XANAX) 1 MG tablet Take 1 tablet (1 mg total) by mouth daily as needed for anxiety. 30 tablet 0  . amoxicillin-clavulanate (AUGMENTIN) 875-125 MG tablet Take one tablet po BID for 10 days (Patient not taking: Reported on 10/12/2019) 20 tablet 0  . cefdinir (OMNICEF) 300 MG capsule Take 1 capsule (300 mg total) by mouth 2 (two) times daily. 20 capsule 0  . citalopram (CELEXA) 40 MG tablet Take 1 tablet (40 mg total) by mouth daily. 90 tablet 0  . dexamethasone (DECADRON) 4 MG tablet Take 1 tablet (4 mg total) by mouth 2 (two) times daily with a meal. 10 tablet 0  . Diphenhyd-Hydrocort-Nystatin (FIRST-DUKES MOUTHWASH) SUSP One tablespoon swish and spit QID 16 mL 0  . fexofenadine (ALLEGRA) 180 MG tablet Take 180 mg by mouth daily as needed for allergies.     . fluticasone (FLOVENT HFA) 44 MCG/ACT  inhaler Inhale 2 puffs into the lungs 2 (two) times daily. 1 Inhaler 0  . ibuprofen (ADVIL) 200 MG tablet Take 800 mg by mouth every 6 (six) hours as needed for mild pain or moderate pain.    Marland Kitchen. levothyroxine (SYNTHROID) 100 MCG tablet Take 75 mcg by mouth daily.    Marland Kitchen. levothyroxine (SYNTHROID) 75 MCG tablet TAKE 1 TABLET BY MOUTH DAILY 30 tablet 2  . Lidocaine HCl (ASPERCREME LIDOCAINE) 4 % LIQD Apply 1 application topically daily as needed (for back pain).    Marland Kitchen. lisdexamfetamine (VYVANSE) 30 MG capsule Take 1 capsule (30  mg total) by mouth daily. 30 capsule 0  . lisdexamfetamine (VYVANSE) 30 MG capsule Take 1 capsule (30 mg total) by mouth daily. 30 capsule 0  . lisdexamfetamine (VYVANSE) 30 MG capsule Take 1 capsule (30 mg total) by mouth daily. 30 capsule 0  . [START ON 11/02/2019] lisdexamfetamine (VYVANSE) 30 MG capsule Take 1 capsule (30 mg total) by mouth daily. 30 capsule 0  . [START ON 12/04/2019] lisdexamfetamine (VYVANSE) 30 MG capsule Take 1 capsule (30 mg total) by mouth daily. 30 capsule 0  . predniSONE (DELTASONE) 20 MG tablet 3qd for 3d then 2qd for 3d then 1qd for 3d 18 tablet 0   Social History   Socioeconomic History  . Marital status: Single    Spouse name: Not on file  . Number of children: Not on file  . Years of education: Not on file  . Highest education level: Not on file  Occupational History  . Not on file  Social Needs  . Financial resource strain: Not on file  . Food insecurity    Worry: Not on file    Inability: Not on file  . Transportation needs    Medical: Not on file    Non-medical: Not on file  Tobacco Use  . Smoking status: Current Every Day Smoker    Packs/day: 0.50    Types: Cigarettes    Start date: 01/28/2013  . Smokeless tobacco: Never Used  Substance and Sexual Activity  . Alcohol use: No    Alcohol/week: 0.0 standard drinks  . Drug use: No  . Sexual activity: Yes    Partners: Male    Birth control/protection: None  Lifestyle  . Physical activity    Days per week: Not on file    Minutes per session: Not on file  . Stress: Not on file  Relationships  . Social Musicianconnections    Talks on phone: Not on file    Gets together: Not on file    Attends religious service: Not on file    Active member of club or organization: Not on file    Attends meetings of clubs or organizations: Not on file    Relationship status: Not on file  . Intimate partner violence    Fear of current or ex partner: Not on file    Emotionally abused: Not on file    Physically  abused: Not on file    Forced sexual activity: Not on file  Other Topics Concern  . Not on file  Social History Narrative   ** Merged History Encounter **       Family History  Problem Relation Age of Onset  . Cancer Paternal Uncle   . COPD Maternal Grandfather   . Stroke Paternal Grandfather   . Depression Mother   . OCD Mother   . Bipolar disorder Paternal Aunt   . Drug abuse Maternal Uncle   .  Depression Maternal Grandmother   . Anxiety disorder Maternal Grandmother      OBJECTIVE:  Vitals:   10/16/19 1051  BP: 122/85  Pulse: 70  Resp: 20  Temp: 98.5 F (36.9 C)  SpO2: 95%    General appearance: Alert; appears acutely ill, nontoxic HEENT: NCAT.  Oropharynx clear.  Lungs: Wheezing appreciated over LT lung field Heart: regular rate and rhythm.  Abdomen: soft, non-distended; normal active bowel sounds; TTP over epigastric, RUQ, and RLQ; TTP at McBurney's point; negative Murphy's sign; + guarding Extremities: no edema; symmetrical with no gross deformities Skin: warm and dry Neurologic: normal gait Psychological: alert and cooperative; normal mood and affect  LABS: Results for orders placed or performed during the hospital encounter of 10/16/19 (from the past 24 hour(s))  POCT urine pregnancy     Status: None   Collection Time: 10/16/19 11:14 AM  Result Value Ref Range   Preg Test, Ur Negative Negative  POCT urinalysis dipstick     Status: Abnormal   Collection Time: 10/16/19 11:14 AM  Result Value Ref Range   Color, UA yellow yellow   Clarity, UA clear clear   Glucose, UA negative negative mg/dL   Bilirubin, UA negative negative   Ketones, POC UA negative negative mg/dL   Spec Grav, UA >=2.878 (A) 1.010 - 1.025   Blood, UA trace-intact (A) negative   pH, UA 6.0 5.0 - 8.0   Protein Ur, POC negative negative mg/dL   Urobilinogen, UA 0.2 0.2 or 1.0 E.U./dL   Nitrite, UA Negative Negative   Leukocytes, UA Trace (A) Negative    ASSESSMENT & PLAN:  1. RUQ  pain   2. Abdominal pain, RLQ   3. Wheezing     Meds ordered this encounter  Medications  . DISCONTD: ondansetron (ZOFRAN) injection 4 mg  . ondansetron (ZOFRAN-ODT) disintegrating tablet 4 mg    Zofran given in office Recommending further evaluation and management in the ED.  Cannot rule out appendicitis or gallbladder disease in urgent care setting.  Patient aware and in agreement with plan.  Will go with fiance by private vehicle to [facility] ED.     Rennis Harding, PA-C 10/16/19 1134    [facility] [place], [place] 10/16/19 1135

## 2019-10-16 NOTE — Anesthesia Procedure Notes (Signed)
Procedure Name: Intubation Date/Time: 10/16/2019 7:11 PM Performed by: Lenice Llamas, MD Pre-anesthesia Checklist: Patient identified, Emergency Drugs available, Suction available, Patient being monitored and Timeout performed Patient Re-evaluated:Patient Re-evaluated prior to induction Oxygen Delivery Method: Circle system utilized Preoxygenation: Pre-oxygenation with 100% oxygen Induction Type: IV induction, Rapid sequence and Cricoid Pressure applied Laryngoscope Size: Mac and 4 Grade View: Grade I Tube type: Oral Tube size: 7.5 mm Number of attempts: 1 Airway Equipment and Method: Stylet Placement Confirmation: ETT inserted through vocal cords under direct vision,  positive ETCO2 and breath sounds checked- equal and bilateral Secured at: 20 cm Tube secured with: Tape Dental Injury: Teeth and Oropharynx as per pre-operative assessment

## 2019-10-16 NOTE — Anesthesia Preprocedure Evaluation (Signed)
Anesthesia Evaluation  Patient identified by MRN, date of birth, ID band Patient awake    Reviewed: Allergy & Precautions, NPO status , Patient's Chart, lab work & pertinent test results  Airway Mallampati: II  TM Distance: >3 FB Neck ROM: Full  Mouth opening: Limited Mouth Opening  Dental no notable dental hx. (+) Teeth Intact   Pulmonary asthma , Current SmokerPatient did not abstain from smoking.,    Pulmonary exam normal breath sounds clear to auscultation       Cardiovascular Exercise Tolerance: Good negative cardio ROS Normal cardiovascular examI Rhythm:Regular Rate:Normal     Neuro/Psych Anxiety  Neuromuscular disease negative psych ROS   GI/Hepatic negative GI ROS, Neg liver ROS,   Endo/Other  Hypothyroidism BMI >37  Renal/GU negative Renal ROS  negative genitourinary   Musculoskeletal negative musculoskeletal ROS (+)   Abdominal   Peds negative pediatric ROS (+)  Hematology negative hematology ROS (+)   Anesthesia Other Findings   Reproductive/Obstetrics negative OB ROS                             Anesthesia Physical Anesthesia Plan  ASA: II and emergent  Anesthesia Plan: General   Post-op Pain Management:    Induction: Intravenous  PONV Risk Score and Plan: 2 and Ondansetron, Dexamethasone, Treatment may vary due to age or medical condition and Midazolam  Airway Management Planned: Oral ETT  Additional Equipment:   Intra-op Plan:   Post-operative Plan: Extubation in OR  Informed Consent: I have reviewed the patients History and Physical, chart, labs and discussed the procedure including the risks, benefits and alternatives for the proposed anesthesia with the patient or authorized representative who has indicated his/her understanding and acceptance.     Dental advisory given  Plan Discussed with:   Anesthesia Plan Comments: (Plan Full PPE use Plan GETA D/W  PT -WTP with same after Q&A)        Anesthesia Quick Evaluation

## 2019-10-16 NOTE — Discharge Instructions (Signed)
Zofran given in office Recommending further evaluation and management in the ED.  Cannot rule out appendicitis or gallbladder disease in urgent care setting.  Patient aware and in agreement with plan.  Will go with fiance by private vehicle to [facility] ED.

## 2019-10-16 NOTE — ED Notes (Signed)
Family at bedside. 

## 2019-10-16 NOTE — Op Note (Signed)
Patient:  Linda [name]  DOB:  26-Feb-1991  MRN:  [id number]   Preop Diagnosis: Acute appendicitis, right ovarian cyst  Postop Diagnosis: Same  Procedure: Laparoscopic appendectomy, laparoscopic aspiration of right ovarian cyst  Surgeon: Aviva Signs, MD  Anes: General endotracheal  Indications: Patient is a 28 year old white female who presented to the emergency room with a 36-hour history of worsening right lower quadrant abdominal pain.  CT scan of the abdomen revealed both acute appendicitis and a 6 cm simple right ovarian cyst.  The risks and benefits of the procedures including bleeding, infection, the possibility of recurrence of the ovarian cyst, and the possibility of an open procedure were fully explained to the patient, who gave informed consent.  Procedure note: The patient was placed in the supine position.  After induction of general endotracheal anesthesia, the abdomen was prepped and draped using the usual sterile technique with ChloraPrep.  Surgical site confirmation was performed.  The supraumbilical incision was made down to the fascia.  A Veress needle was introduced into the abdominal cavity and confirmation of placement was done using the saline drop test.  The abdomen was then insufflated to 15 mmHg pressure.  An 11 mm trocar was introduced into the abdominal cavity under direct visualization without difficulty.  The patient was placed in deeper Trendelenburg position and an additional 12 mm trocar was placed in the suprapubic region and a 5 mm trocar was placed in the left lower quadrant region.  The appendix was visualized and noted to be diffusely inflamed.  There was no evidence of perforation.  The right ovarian cyst was also fully visualized.  Using the harmonic scalpel.  The right ovarian cyst was punctured and the fluid was aspirated.  The cyst fully collapsed.  The mesoappendix was then divided using the harmonic scalpel.  The juncture of the appendix to the  cecum was fully visualized.  A standard Endo GIA was placed across the base the appendix and fired.  The appendix was then removed using an Endo Catch bag.  The staple line was inspected noted to be within normal limits.  Arista was placed along the appendiceal mesentery.  All fluid and air were then evacuated from the abdominal cavity prior to the removal of the trochars.  All wounds were irrigated with normal saline.  All wounds were injected with Exparel.  All incisions were closed using a 4-0 Monocryl subcuticular suture.  Dermabond was applied.  All tape and needle counts were correct at the end of the procedure.  The patient was extubated in the operating room and transferred to PACU in stable condition.  Complications: None  EBL: Minimal  Specimen: Appendix

## 2019-10-16 NOTE — Transfer of Care (Signed)
Immediate Anesthesia Transfer of Care Note  Patient: Linda [name]  Procedure(s) Performed: APPENDECTOMY LAPAROSCOPIC (N/A Abdomen)  Patient Location: PACU  Anesthesia Type:General  Level of Consciousness: awake and alert   Airway & Oxygen Therapy: Patient Spontanous Breathing and Patient connected to face mask oxygen  Post-op Assessment: Report given to RN and Post -op Vital signs reviewed and stable  Post vital signs: Reviewed and stable  Last Vitals:  Vitals Value Taken Time  BP 150/90 10/16/19 2001  Temp 36.8 C 10/16/19 2000  Pulse 79 10/16/19 2012  Resp 15 10/16/19 2012  SpO2 100 % 10/16/19 2012  Vitals shown include unvalidated device data.  Last Pain:  Vitals:   10/16/19 2000  TempSrc:   PainSc: 0-No pain         Complications: No apparent anesthesia complications

## 2019-10-16 NOTE — ED Provider Notes (Signed)
[facility] EMERGENCY DEPARTMENT Provider Note   CSN: [id number] Arrival date & time: 10/16/19  1232     History   Chief Complaint Chief Complaint  Patient presents with   Abdominal Pain    HPI Linda [name] is a 28 y.o. female.     Linda [name] is a 28 y.o. female with a history of anxiety, asthma, IBS, interstitial cystitis, who presents to the emergency department from urgent care for further evaluation of right-sided abdominal pain.  Pain is a constant sharp ache, pain is an 8/10.  Patient reports pain has been present for 1 week and initially seemed to start in the right lower quadrant but over the past 3 days pain has become worse and more localized to the right upper quadrant and today for the first time she had multiple episodes of nonbloody emesis.  Patient reports decreased appetite.  She reports some chills but no associated fevers.  Reports she has had intermittent diarrhea and constipation, but no blood in the stools.  She denies any dysuria or urinary frequency.  No vaginal discharge, vaginal bleeding or pelvic pain.  She reports pain seems to be worse after eating.  Strong family history of gallbladder disease and she is worried this could be her gallbladder.  Patient has not taken anything for pain prior to arrival.  But yesterday tried ibuprofen and Tylenol without relief.  Aside from C-section no other previous abdominal surgeries.  Last menstrual period 07/11/2019, is sexually active without contraception.     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   Morbid  obesity (HCC) 02/07/2014   Asthma, chronic 04/15/2013   Chronic anxiety 04/15/2013   Cervical strain 03/07/2012   Sprain and strain of unspecified site of shoulder and upper arm 03/07/2012   Pain in joint, shoulder region 03/07/2012    Past Surgical History:  Procedure Laterality Date   CESAREAN SECTION N/A 03/20/2013   Procedure: CESAREAN SECTION;  Surgeon: [name], MD;  Location: [facility];  Service: Obstetrics;  Laterality: N/A;   CESAREAN SECTION     MYRINGOTOMY WITH TUBE PLACEMENT     NO PAST SURGERIES     TYMPANOSTOMY TUBE PLACEMENT     UPPER GI ENDOSCOPY       OB History    Gravida  1   Para  1   Term  0   Preterm  1   AB  0   Living  1     SAB  0   TAB  0   Ectopic  0   Multiple      Live Births  1            Home Medications    Prior to Admission medications   Medication Sig Start Date End Date Taking? Authorizing Provider  albuterol (VENTOLIN HFA) 108 (90 Base) MCG/ACT inhaler Inhale 2 puffs into the lungs every 4 (four) hours as needed for wheezing or shortness of breath. 03/26/19  Yes Merlyn Albert, MD  ALPRAZolam Prudy Feeler) 1 MG tablet 0.5-1 mg daily as needed for anxiety 07/20/19  Yes Neysa HotterHisada, Reina, MD  citalopram (CELEXA) 40 MG tablet Take 1 tablet (40 mg total) by mouth daily. 10/05/19  Yes Zena AmosKaur, Mandeep, MD  ibuprofen (ADVIL) 200 MG tablet Take 800 mg by mouth every 6 (six) hours as needed for mild pain or moderate pain.   Yes [provider]  Terri PiedraLevonorgest-Eth Estrad 91-Day (JOLESSA PO) Take 1 tablet by mouth daily.   Yes [provider]  levothyroxine (SYNTHROID) 125 MCG tablet Take 1 tablet by mouth daily.   Yes [provider]  ALPRAZolam Prudy Feeler(XANAX) 1 MG tablet Take 1 tablet (1 mg total) by mouth daily as needed for anxiety. Patient not taking: Reported on 10/16/2019 10/05/19   Zena AmosKaur, Mandeep, MD  amoxicillin-clavulanate (AUGMENTIN) 875-125 MG tablet Take one tablet po BID for 10 days Patient not  taking: Reported on 10/12/2019 09/24/19   Babs SciaraLuking, Scott A, MD  cefdinir (OMNICEF) 300 MG capsule Take 1 capsule (300 mg total) by mouth 2 (two) times daily. Patient not taking: Reported on 10/16/2019 10/12/19   Merlyn AlbertLuking, William S, MD  dexamethasone (DECADRON) 4 MG tablet Take 1 tablet (4 mg total) by mouth 2 (two) times daily with a meal. Patient not taking: Reported on 10/16/2019 06/26/19   Ivery QualeBryant, [place], PA-C  Diphenhyd-Hydrocort-Nystatin (FIRST-DUKES MOUTHWASH) SUSP One tablespoon swish and spit QID Patient not taking: Reported on 10/16/2019 07/10/19   Merlyn AlbertLuking, William S, MD  fexofenadine (ALLEGRA) 180 MG tablet Take 180 mg by mouth daily as needed for allergies.     [provider]  fluticasone (FLOVENT HFA) 44 MCG/ACT inhaler Inhale 2 puffs into the lungs 2 (two) times daily. Patient not taking: Reported on 10/16/2019 10/12/19   Merlyn AlbertLuking, William S, MD  Lidocaine HCl (ASPERCREME LIDOCAINE) 4 % LIQD Apply 1 application topically daily as needed (for back pain).    [provider]  lisdexamfetamine (VYVANSE) 30 MG capsule Take 1 capsule (30 mg total) by mouth daily. Patient not taking: Reported on 10/16/2019 07/10/19   Neysa HotterHisada, Reina, MD  lisdexamfetamine (VYVANSE) 30 MG capsule Take 1 capsule (30 mg total) by mouth daily. Patient not taking: Reported on 10/16/2019 08/10/19   Neysa HotterHisada, Reina, MD  lisdexamfetamine (VYVANSE) 30 MG capsule Take 1 capsule (30 mg total) by mouth daily. Patient not taking: Reported on 10/16/2019 10/05/19   Zena AmosKaur, Mandeep, MD  lisdexamfetamine (VYVANSE) 30 MG capsule Take 1 capsule (30 mg total) by mouth daily. Patient not taking: Reported on 10/16/2019 11/02/19   Zena AmosKaur, Mandeep, MD  lisdexamfetamine (VYVANSE) 30 MG capsule Take 1 capsule (30 mg total) by mouth daily. Patient not taking: Reported on 10/16/2019 12/04/19   Zena AmosKaur, Mandeep, MD  predniSONE (DELTASONE) 20 MG tablet 3qd for 3d then 2qd for 3d then 1qd for 3d Patient not taking: Reported on 10/16/2019 10/12/19    Merlyn AlbertLuking, William S, MD    Family History Family History  Problem Relation Age of Onset   Cancer Paternal Uncle    COPD Maternal Grandfather    Stroke Paternal Grandfather    Depression Mother    OCD Mother    Bipolar disorder Paternal Aunt    Drug abuse Maternal Uncle    Depression Maternal Grandmother    Anxiety disorder Maternal Grandmother     Social History Social History   Tobacco Use   Smoking status: Current Every Day Smoker    Packs/day: 0.50    Types: Cigarettes    Start date: 01/28/2013   Smokeless tobacco: Never Used  Substance Use Topics   Alcohol use: No  Alcohol/week: 0.0 standard drinks   Drug use: No     Allergies   Ciprofloxacin, Sulfa antibiotics, Buspar [buspirone], and Levaquin [levofloxacin]   Review of Systems Review of Systems  Constitutional: Positive for chills. Negative for fever.  HENT: Negative.   Respiratory: Negative for cough and shortness of breath.   Cardiovascular: Negative for chest pain.  Gastrointestinal: Positive for abdominal pain, constipation, diarrhea, nausea and vomiting. Negative for blood in stool.  Genitourinary: Negative for dysuria, frequency, vaginal bleeding and vaginal discharge.  Musculoskeletal: Negative for arthralgias and myalgias.  Skin: Negative for color change and rash.  Neurological: Negative for dizziness, syncope and light-headedness.  All other systems reviewed and are negative.    Physical Exam Updated Vital Signs BP (!) 137/59 (BP Location: Right Arm)    Pulse 66    Temp 98.2 F (36.8 C) (Oral)    Resp 20    Ht 5\' 6"  (1.676 m)    Wt 104.3 kg    LMP 07/15/2019    SpO2 95%    BMI 37.12 kg/m   Physical Exam Vitals signs and nursing note reviewed.  Constitutional:      General: She is not in acute distress.    Appearance: She is well-developed. She is obese. She is not ill-appearing or diaphoretic.  HENT:     Head: Normocephalic and atraumatic.     Mouth/Throat:     Mouth:  Mucous membranes are moist.     Pharynx: Oropharynx is clear.  Eyes:     General:        Right eye: No discharge.        Left eye: No discharge.     Pupils: Pupils are equal, round, and reactive to light.  Neck:     Musculoskeletal: Neck supple.  Cardiovascular:     Rate and Rhythm: Normal rate and regular rhythm.     Heart sounds: Normal heart sounds.  Pulmonary:     Effort: Pulmonary effort is normal. No respiratory distress.     Breath sounds: Normal breath sounds. No wheezing or rales.     Comments: Respirations equal and unlabored, patient able to speak in full sentences, lungs clear to auscultation bilaterally Abdominal:     General: Bowel sounds are normal. There is no distension.     Palpations: Abdomen is soft. There is no mass.     Tenderness: There is abdominal tenderness in the right upper quadrant and right lower quadrant. There is guarding.     Comments: Abdomen is soft and nondistended, bowel sounds are present throughout, patient has focal tenderness in the right upper and lower quadrants, with some guarding, pain is worse in the right upper quadrant with positive Murphy sign, patient also exhibits some tenderness at McBurney's point.  She does not have any CVA tenderness.  No left-sided abdominal tenderness.  Musculoskeletal:        General: No deformity.  Skin:    General: Skin is warm and dry.     Capillary Refill: Capillary refill takes less than 2 seconds.  Neurological:     Mental Status: She is alert.     Coordination: Coordination normal.     Comments: Speech is clear, able to follow commands Moves extremities without ataxia, coordination intact  Psychiatric:        Mood and Affect: Mood normal.        Behavior: Behavior normal.      ED Treatments / Results  Labs (all labs ordered are listed, but only  abnormal results are displayed) Labs Reviewed  COMPREHENSIVE METABOLIC PANEL - Abnormal; Notable for the following components:      Result Value    Glucose, Bld 110 (*)    Total Protein 8.4 (*)    All other components within normal limits  CBC WITH DIFFERENTIAL/PLATELET - Abnormal; Notable for the following components:   WBC 15.1 (*)    Neutro Abs 12.6 (*)    All other components within normal limits  URINALYSIS, ROUTINE W REFLEX MICROSCOPIC - Abnormal; Notable for the following components:   APPearance HAZY (*)    All other components within normal limits  LIPASE, BLOOD  POC URINE PREG, ED    EKG None  Radiology Ct Abdomen Pelvis W Contrast  Result Date: 10/16/2019 CLINICAL DATA:  Abd pain, appendicitis suspected Patient with pain in the right upper and lower quadrants, right upper quadrant ultrasound unremarkable. EXAM: CT ABDOMEN AND PELVIS WITH CONTRAST TECHNIQUE: Multidetector CT imaging of the abdomen and pelvis was performed using the standard protocol following bolus administration of intravenous contrast. CONTRAST:  OMNIPAQUE IOHEXOL 300 MG/ML  SOLN COMPARISON:  Right upper quadrant ultrasound earlier this day. FINDINGS: Lower chest: No acute findings allowing for motion. Mild atelectasis in the medial left lower lobe. No pleural fluid. Hepatobiliary: The liver is enlarged spanning 23 cm cranial caudal with diffuse steatosis. No focal hepatic abnormality. Gallbladder physiologically distended, no calcified stone. No biliary dilatation. Pancreas: No ductal dilatation or inflammation. Spleen: Normal in size without focal abnormality. Adrenals/Urinary Tract: Normal adrenal glands. No hydronephrosis or perinephric edema. Homogeneous renal enhancement. Urinary bladder is partially distended without wall thickening. Stomach/Bowel: Acute appendicitis as described below. The stomach is nondistended. No small bowel obstruction, wall thickening or inflammation. Majority of the colon is decompressed. Dilated fluid-filled structure in the right lower quadrant arising from the cecum is presumably inflamed appendix, series 2, image 66.  There is internal low-density and faint periappendiceal fat stranding. Appendix: Location: Retrocecal Diameter: 15 mm Appendicolith: None. Mucosal hyper-enhancement: None. Extraluminal gas: No. Periappendiceal collection: No. Vascular/Lymphatic: Abdominal aorta is normal in caliber. Portal vein is patent. Few prominent ileocolic nodes are nonspecific but likely reactive. Reproductive: 6.1 cm right ovarian cyst, measures simple fluid density and is homogeneous. Left ovary and uterus are unremarkable. Other: No free air or intra-abdominal abscess. Small fat containing umbilical hernia. Musculoskeletal: There are no acute or suspicious osseous abnormalities. IMPRESSION: 1. Uncomplicated acute appendicitis. 2. Hepatomegaly and hepatic steatosis. 3. Right ovarian cyst measuring 6.1 cm. There are no CT findings to suggest ovarian torsion or complication, and given appendiceal inflammation, torsion is felt unlikely. Pelvic ultrasound could be obtained to document blood flow. Regardless, given size greater than 5 cm, recommend follow-up ultrasound in 6-12 weeks. Electronically Signed   By: Narda Rutherford M.D.   On: 10/16/2019 15:40   US Abdomen Limited Ruq  Result Date: 10/16/2019 CLINICAL DATA:  Right upper quadrant pain. EXAM: ULTRASOUND ABDOMEN LIMITED RIGHT UPPER QUADRANT COMPARISON:  None. FINDINGS: Gallbladder: No gallstones or wall thickening visualized. No sonographic Murphy sign noted by sonographer. Common bile duct: Diameter: 2.8 mm, normal. Liver: No focal lesion identified. Diffuse increased echogenicity of the prominent liver, consistent with hepatic steatosis. Portal vein is patent on color Doppler imaging with normal direction of blood flow towards the liver. Other: None. IMPRESSION: Hepatomegaly and hepatic steatosis.  No acute abnormality. Electronically Signed   By: Francene Boyers M.D.   On: 10/16/2019 14:14    Procedures Procedures (including critical care time)  Medications Ordered  in  ED Medications  morphine 4 MG/ML injection 4 mg (4 mg Intravenous Refused 10/16/19 1413)  cefTRIAXone (ROCEPHIN) 2 g in sodium chloride 0.9 % 100 mL IVPB (has no administration in time range)    And  metroNIDAZOLE (FLAGYL) IVPB 500 mg (has no administration in time range)  ondansetron (ZOFRAN) injection 4 mg (4 mg Intravenous Given 10/16/19 1404)  sodium chloride 0.9 % bolus 1,000 mL (1,000 mLs Intravenous New Bag/Given 10/16/19 1412)  iohexol (OMNIPAQUE) 300 MG/ML solution 100 mL (100 mLs Intravenous Contrast Given 10/16/19 1520)     Initial Impression / Assessment and Plan / ED Course  I have reviewed the triage vital signs and the nursing notes.  Pertinent labs & imaging results that were available during my care of the patient were reviewed by me and considered in my medical decision making (see chart for details).  28 year old female sent from urgent care for evaluation of right upper and lower quadrant abdominal pain that has been present for a week, worsening today with 7 episodes of emesis, nonbloody.  She has had chills but no associated fevers.  Reports she has been going back and forth between diarrhea and constipation but no blood in the stools.  Reports symptoms have been worse after eating.  Concern for acute cholecystitis versus appendicitis as patient is tender in the right upper and lower quadrants on exam.  She has not had any pelvic pain, vaginal bleeding or discharge and I have lower suspicion for pelvic etiology.  Will get abdominal labs and start with right upper quadrant ultrasound to help avoid radiation cholecystitis as cause for pain.  If this is normal we will plan to proceed with CT.  IV fluids, Zofran and morphine ordered but notified by nursing that patient does not want any narcotic pain medication at this time.  Labs show leukocytosis of 15.1, stable hemoglobin, no significant electrolyte abnormalities, negative pregnancy and urinalysis without signs of infection.   Normal renal and liver function and normal lipase.  Right upper quadrant ultrasound shows some hepatic steatosis but no evidence of acute cholecystitis.  Patient still having pain, will proceed with CT to rule out appendicitis.  CT scan shows uncomplicated appendicitis, there is also a 6.1 cm right ovarian cyst noted.  No CT findings to suggest torsion is felt unlikely in the setting of appendiceal inflammation.  Will consult general surgery.  Case discussed with Dr. Lovell Sheehan with general surgery who will plan for a laparoscopic appendectomy later this evening.  Patient has been given IV Rocephin and Flagyl for preop antibiotics.  AC called for approval for 2-hour rapid Covid test which has been ordered.  I discussed this plan with the patient who expresses agreement and understanding.  Patient has been very anxious and has not wanted any narcotic pain medications, but she is uncomfortable and I have discussed with her that Toradol is not safe prior to surgery and she needs to remain n.p.o. and cannot have oral medications and surgery is planned for later this evening.  Patient states that she has never had morphine before, does not like to feel high and is very anxious about this.  After discussion with patient and her mom, she would like something for pain will give 4 mg IV morphine which will be diluted and pushed slowly.  Patient is agreeable to this.  Final Clinical Impressions(s) / ED Diagnoses   Final diagnoses:  Acute appendicitis, uncomplicated  Ovarian cyst, right  RUQ abdominal pain    ED Discharge  Orders    None       Dartha Lodge, [place] 10/17/19 [id number]    Vanetta Mulders, MD 10/17/19 [phone]

## 2019-10-16 NOTE — ED Triage Notes (Signed)
PT c/o epigastric and RUQ abdominal pain x7 days with nausea. PT states vomiting started today and had 6 episodes. PT states pain worsens if she tries to eat and has increased belching. PT denies any urinary symptoms. PT was seen at urgent care today in [place] and was told to come to ED for further eval.

## 2019-10-16 NOTE — ED Triage Notes (Signed)
Pt presents with c/o lower abdominal pain that started a week ago, nausea developed yesterday , pt reports poor appetite

## 2019-10-16 NOTE — H&P (Signed)
Linda [name] is an 28 y.o. female.   Chief Complaint: Right lower quadrant abdominal pain HPI: Patient is a 28 year old white female who presented emergency room with a 36-hour history of worsening right lower quadrant abdominal pain.  CT scan of the abdomen reveals acute appendicitis without perforation.  Patient states that the pain is approximately a 4 out of 5 out of 10.  She has had a decreased appetite.  She denies any diarrhea.  Past Medical History:  Diagnosis Date  . Anxiety   . Asthma   . Heart murmur   . IBS (irritable bowel syndrome)   . Interstitial cystitis     Past Surgical History:  Procedure Laterality Date  . CESAREAN SECTION N/A 03/20/2013   Procedure: CESAREAN SECTION;  Surgeon: Logan Bores, MD;  Location: [facility];  Service: Obstetrics;  Laterality: N/A;  . CESAREAN SECTION    . MYRINGOTOMY WITH TUBE PLACEMENT    . NO PAST SURGERIES    . TYMPANOSTOMY TUBE PLACEMENT    . UPPER GI ENDOSCOPY      Family History  Problem Relation Age of Onset  . Cancer Paternal Uncle   . COPD Maternal Grandfather   . Stroke Paternal Grandfather   . Depression Mother   . OCD Mother   . Bipolar disorder Paternal Aunt   . Drug abuse Maternal Uncle   . Depression Maternal Grandmother   . Anxiety disorder Maternal Grandmother    Social History:  reports that she has been smoking cigarettes. She started smoking about 6 years ago. She has been smoking about 0.50 packs per day. She has never used smokeless tobacco. She reports that she does not drink alcohol or use drugs.  Allergies:  Allergies  Allergen Reactions  . Ciprofloxacin Nausea Only  . Sulfa Antibiotics Diarrhea and Nausea And Vomiting  . Buspar [Buspirone] Anxiety  . Levaquin [Levofloxacin] Anxiety    (Not in a hospital admission)   Results for orders placed or performed during the hospital encounter of 10/16/19 (from the past 48 hour(s))  Urinalysis, Routine w reflex microscopic     Status: Abnormal    Collection Time: 10/16/19  1:34 PM  Result Value Ref Range   Color, Urine YELLOW YELLOW   APPearance HAZY (A) CLEAR   Specific Gravity, Urine 1.019 1.005 - 1.030   pH 6.0 5.0 - 8.0   Glucose, UA NEGATIVE NEGATIVE mg/dL   Hgb urine dipstick NEGATIVE NEGATIVE   Bilirubin Urine NEGATIVE NEGATIVE   Ketones, ur NEGATIVE NEGATIVE mg/dL   Protein, ur NEGATIVE NEGATIVE mg/dL   Nitrite NEGATIVE NEGATIVE   Leukocytes,Ua NEGATIVE NEGATIVE    Comment: Performed at [facility], [address], [place], [place] [address]  Comprehensive metabolic panel     Status: Abnormal   Collection Time: 10/16/19  2:07 PM  Result Value Ref Range   Sodium 138 135 - 145 mmol/L   Potassium 4.1 3.5 - 5.1 mmol/L   Chloride 103 98 - 111 mmol/L   CO2 23 22 - 32 mmol/L   Glucose, Bld 110 (H) 70 - 99 mg/dL   BUN 10 6 - 20 mg/dL   Creatinine, Ser 0.70 0.44 - 1.00 mg/dL   Calcium 9.5 8.9 - 10.3 mg/dL   Total Protein 8.4 (H) 6.5 - 8.1 g/dL   Albumin 4.4 3.5 - 5.0 g/dL   AST 19 15 - 41 U/L   ALT 24 0 - 44 U/L   Alkaline Phosphatase 99 38 - 126 U/L   Total  Bilirubin 0.4 0.3 - 1.2 mg/dL   GFR calc non Af Amer >60 >60 mL/min   GFR calc Af Amer >60 >60 mL/min   Anion gap 12 5 - 15    Comment: Performed at [facility], [address], [place], [place] [address]  Lipase, blood     Status: None   Collection Time: 10/16/19  2:07 PM  Result Value Ref Range   Lipase 20 11 - 51 U/L    Comment: Performed at [facility], [address], [place], [place] [address]  CBC with Differential     Status: Abnormal   Collection Time: 10/16/19  2:07 PM  Result Value Ref Range   WBC 15.1 (H) 4.0 - 10.5 K/uL   RBC 4.83 3.87 - 5.11 MIL/uL   Hemoglobin 13.9 12.0 - 15.0 g/dL   HCT 27.7 82.4 - 23.5 %   MCV 91.1 80.0 - 100.0 fL   MCH 28.8 26.0 - 34.0 pg   MCHC 31.6 30.0 - 36.0 g/dL   RDW 36.1 44.3 - 15.4 %   Platelets 360 150 - 400 K/uL   nRBC 0.0 0.0 - 0.2 %   Neutrophils Relative % 84 %   Neutro Abs 12.6 (H) 1.7 - 7.7 K/uL    Lymphocytes Relative 11 %   Lymphs Abs 1.6 0.7 - 4.0 K/uL   Monocytes Relative 4 %   Monocytes Absolute 0.6 0.1 - 1.0 K/uL   Eosinophils Relative 1 %   Eosinophils Absolute 0.2 0.0 - 0.5 K/uL   Basophils Relative 0 %   Basophils Absolute 0.1 0.0 - 0.1 K/uL   Immature Granulocytes 0 %   Abs Immature Granulocytes 0.06 0.00 - 0.07 K/uL    Comment: Performed at [facility], [address], [place], [place] [address]  POC urine preg, ED     Status: None   Collection Time: 10/16/19  2:21 PM  Result Value Ref Range   Preg Test, Ur NEGATIVE NEGATIVE    Comment:        THE SENSITIVITY OF THIS METHODOLOGY IS >24 mIU/mL   SARS Coronavirus 2 by RT PCR (hospital order, performed in [facility] hospital lab) Nasopharyngeal Nasopharyngeal Swab     Status: None   Collection Time: 10/16/19  4:24 PM   Specimen: Nasopharyngeal Swab  Result Value Ref Range   SARS Coronavirus 2 NEGATIVE NEGATIVE    Comment: (NOTE) SARS-CoV-2 target nucleic acids are NOT DETECTED. The SARS-CoV-2 RNA is generally detectable in upper and lower respiratory specimens during the acute phase of infection. The lowest concentration of SARS-CoV-2 viral copies this assay can detect is 250 copies / mL. A negative result does not preclude SARS-CoV-2 infection and should not be used as the sole basis for treatment or other patient management decisions.  A negative result may occur with improper specimen collection / handling, submission of specimen other than nasopharyngeal swab, presence of viral mutation(s) within the areas targeted by this assay, and inadequate number of viral copies (<250 copies / mL). A negative result must be combined with clinical observations, patient history, and epidemiological information. Fact Sheet for Patients:   BoilerBrush.com.cy Fact Sheet for Healthcare Providers: https://pope.com/ This test is not yet approved or cleared  by the [place]  FDA and has been authorized for detection and/or diagnosis of SARS-CoV-2 by FDA under an Emergency Use Authorization (EUA).  This EUA will remain in effect (meaning this test can be used) for the duration of the COVID-19 declaration under Section 564(b)(1) of  the Act, 21 U.S.C. section 360bbb-3(b)(1), unless the authorization is terminated or revoked sooner. Performed at [facility], [address] [place], [place] [address]    Ct Abdomen Pelvis W Contrast  Result Date: 10/16/2019 CLINICAL DATA:  Abd pain, appendicitis suspected Patient with pain in the right upper and lower quadrants, right upper quadrant ultrasound unremarkable. EXAM: CT ABDOMEN AND PELVIS WITH CONTRAST TECHNIQUE: Multidetector CT imaging of the abdomen and pelvis was performed using the standard protocol following bolus administration of intravenous contrast. CONTRAST:  100mL OMNIPAQUE IOHEXOL 300 MG/ML  SOLN COMPARISON:  Right upper quadrant ultrasound earlier this day. FINDINGS: Lower chest: No acute findings allowing for motion. Mild atelectasis in the medial left lower lobe. No pleural fluid. Hepatobiliary: The liver is enlarged spanning 23 cm cranial caudal with diffuse steatosis. No focal hepatic abnormality. Gallbladder physiologically distended, no calcified stone. No biliary dilatation. Pancreas: No ductal dilatation or inflammation. Spleen: Normal in size without focal abnormality. Adrenals/Urinary Tract: Normal adrenal glands. No hydronephrosis or perinephric edema. Homogeneous renal enhancement. Urinary bladder is partially distended without wall thickening. Stomach/Bowel: Acute appendicitis as described below. The stomach is nondistended. No small bowel obstruction, wall thickening or inflammation. Majority of the colon is decompressed. Dilated fluid-filled structure in the right lower quadrant arising from the cecum is presumably inflamed appendix, series 2, image 66. There is internal low-density and faint  periappendiceal fat stranding. Appendix: Location: Retrocecal Diameter: 15 mm Appendicolith: None. Mucosal hyper-enhancement: None. Extraluminal gas: No. Periappendiceal collection: No. Vascular/Lymphatic: Abdominal aorta is normal in caliber. Portal vein is patent. Few prominent ileocolic nodes are nonspecific but likely reactive. Reproductive: 6.1 cm right ovarian cyst, measures simple fluid density and is homogeneous. Left ovary and uterus are unremarkable. Other: No free air or intra-abdominal abscess. Small fat containing umbilical hernia. Musculoskeletal: There are no acute or suspicious osseous abnormalities. IMPRESSION: 1. Uncomplicated acute appendicitis. 2. Hepatomegaly and hepatic steatosis. 3. Right ovarian cyst measuring 6.1 cm. There are no CT findings to suggest ovarian torsion or complication, and given appendiceal inflammation, torsion is felt unlikely. Pelvic ultrasound could be obtained to document blood flow. Regardless, given size greater than 5 cm, recommend follow-up ultrasound in 6-12 weeks. Electronically Signed   By: Narda RutherfordMelanie  Sanford M.D.   On: 10/16/2019 15:40   [place] Abdomen Limited Ruq  Result Date: 10/16/2019 CLINICAL DATA:  Right upper quadrant pain. EXAM: ULTRASOUND ABDOMEN LIMITED RIGHT UPPER QUADRANT COMPARISON:  None. FINDINGS: Gallbladder: No gallstones or wall thickening visualized. No sonographic Murphy sign noted by sonographer. Common bile duct: Diameter: 2.8 mm, normal. Liver: No focal lesion identified. Diffuse increased echogenicity of the prominent liver, consistent with hepatic steatosis. Portal vein is patent on color Doppler imaging with normal direction of blood flow towards the liver. Other: None. IMPRESSION: Hepatomegaly and hepatic steatosis.  No acute abnormality. Electronically Signed   By: Francene BoyersJames  Maxwell M.D.   On: 10/16/2019 14:14    Review of Systems  Constitutional: Positive for malaise/fatigue.  HENT: Negative.   Eyes: Negative.   Respiratory:  Negative.   Cardiovascular: Negative.   Gastrointestinal: Positive for abdominal pain.  Genitourinary: Negative.   Musculoskeletal: Negative.   Skin: Negative.   Neurological: Negative.   Endo/Heme/Allergies: Negative.   Psychiatric/Behavioral: Negative.     Blood pressure (!) 114/49, pulse 63, temperature 98.2 F (36.8 C), temperature source Oral, resp. rate 20, height 5\' 6"  (1.676 m), weight 104.3 kg, last menstrual period 07/15/2019, SpO2 100 %. Physical Exam  Vitals reviewed. Constitutional: She is oriented to person, place,  and time. She appears well-developed and well-nourished. No distress.  Obese  HENT:  Head: Normocephalic and atraumatic.  Cardiovascular: Normal rate and regular rhythm. Exam reveals no gallop and no friction rub.  No murmur heard. Respiratory: Effort normal and breath sounds normal. No respiratory distress. She has no wheezes. She has no rales.  GI: Soft. Bowel sounds are normal. She exhibits no distension. There is abdominal tenderness. There is no rebound and no guarding.  Tender in the right lower quadrant to palpation.  No rigidity is noted.  Neurological: She is alert and oriented to person, place, and time.  Skin: Skin is warm and dry.    CT scan images personally reviewed.  Also has a large right simple ovarian cyst Assessment/Plan Impression: Acute appendicitis, right ovarian cyst Plan: Will take patient to the operating room for laparoscopic appendectomy and cyst aspiration.  The risks and benefits of the procedure including bleeding, infection, and the possibility of an open procedure were fully explained to the patient, who gave informed consent.  Franky Macho, MD 10/16/2019, 6:48 PM

## 2019-10-17 ENCOUNTER — Encounter (HOSPITAL_COMMUNITY): Payer: Self-pay | Admitting: General Surgery

## 2019-10-17 LAB — CBC
HCT: 41.1 % (ref 36.0–46.0)
Hemoglobin: 12.7 g/dL (ref 12.0–15.0)
MCH: 28.9 pg (ref 26.0–34.0)
MCHC: 30.9 g/dL (ref 30.0–36.0)
MCV: 93.6 fL (ref 80.0–100.0)
Platelets: 364 10*3/uL (ref 150–400)
RBC: 4.39 MIL/uL (ref 3.87–5.11)
RDW: 13.1 % (ref 11.5–15.5)
WBC: 12.3 10*3/uL — ABNORMAL HIGH (ref 4.0–10.5)
nRBC: 0 % (ref 0.0–0.2)

## 2019-10-17 LAB — BASIC METABOLIC PANEL
Anion gap: 10 (ref 5–15)
BUN: 9 mg/dL (ref 6–20)
CO2: 24 mmol/L (ref 22–32)
Calcium: 9 mg/dL (ref 8.9–10.3)
Chloride: 104 mmol/L (ref 98–111)
Creatinine, Ser: 0.75 mg/dL (ref 0.44–1.00)
GFR calc Af Amer: >60 mL/min (ref >60)
GFR calc non Af Amer: >60 mL/min (ref >60)
Glucose, Bld: 156 mg/dL — ABNORMAL HIGH (ref 70–99)
Potassium: 4.3 mmol/L (ref 3.5–5.1)
Sodium: 138 mmol/L (ref 135–145)

## 2019-10-17 MED ORDER — HYDROCODONE-ACETAMINOPHEN 5-325 MG PO TABS: 1.0000 | ORAL_TABLET | ORAL | 0 refills | Status: DC | PRN

## 2019-10-17 NOTE — Discharge Instructions (Signed)
Laparoscopic Appendectomy, Adult, Care After °This sheet gives you information about how to care for yourself after your procedure. Your health care provider may also give you more specific instructions. If you have problems or questions, contact your health care provider. °What can I expect after the procedure? °After the procedure, it is common to have: °· Little energy for normal activities. °· Mild pain in the area where the incisions were made. °· Difficulty passing stool (constipation). This can be caused by: °? Pain medicine. °? A decrease in your activity. °Follow these instructions at home: °Medicines °· Take over-the-counter and prescription medicines only as told by your health care provider. °· If you were prescribed an antibiotic medicine, take it as told by your health care provider. Do not stop taking the antibiotic even if you start to feel better. °· Do not drive or use heavy machinery while taking prescription pain medicine. °· Ask your health care provider if the medicine prescribed to you can cause constipation. You may need to take steps to prevent or treat constipation, such as: °? Drink enough fluid to keep your urine pale yellow. °? Take over-the-counter or prescription medicines. °? Eat foods that are high in fiber, such as beans, whole grains, and fresh fruits and vegetables. °? Limit foods that are high in fat and processed sugars, such as fried or sweet foods. °Incision care ° °· Follow instructions from your health care provider about how to take care of your incisions. Make sure you: °? Wash your hands with soap and water before and after you change your bandage (dressing). If soap and water are not available, use hand sanitizer. °? Change your dressing as told by your health care provider. °? Leave stitches (sutures), skin glue, or adhesive strips in place. These skin closures may need to stay in place for 2 weeks or longer. If adhesive strip edges start to loosen and curl up, you may  trim the loose edges. Do not remove adhesive strips completely unless your health care provider tells you to do that. °· Check your incision areas every day for signs of infection. Check for: °? Redness, swelling, or pain. °? Fluid or blood. °? Warmth. °? Pus or a bad smell. °Bathing °· Keep your incisions clean and dry. Clean them as often as told by your health care provider. To do this: °1. Gently wash the incisions with soap and water. °2. Rinse the incisions with water to remove all soap. °3. Pat the incisions dry with a clean towel. Do not rub the incisions. °· Do not take baths, swim, or use a hot tub for 2 weeks, or until your health care provider approves. You may take showers after 48 hours. °Activity ° °· Do not drive for 24 hours if you were given a sedative during your procedure. °· Rest after the procedure. Return to your normal activities as told by your health care provider. Ask your health care provider what activities are safe for you. °· For 3 weeks, or for as long as told by your health care provider: °? Do not lift anything that is heavier than 10 lb (4.5 kg), or the limit that you are told. °? Do not play contact sports. °General instructions °· If you were sent home with a drain, follow instructions from your health care provider about how to care for it. °· Take deep breaths. This helps to prevent your lungs from developing an infection (pneumonia). °· Keep all follow-up visits as told by your health   care provider. This is important. °Contact a health care provider if: °· You have redness, swelling, or pain around an incision. °· You have fluid or blood coming from an incision. °· Your incision feels warm to the touch. °· You have pus or a bad smell coming from an incision or dressing. °· Your incision edges break open after your sutures have been removed. °· You have increasing pain in your shoulders. °· You feel dizzy or you faint. °· You develop shortness of breath. °· You keep feeling  nauseous or you are vomiting. °· You have diarrhea or you cannot control your bowel functions. °· You lose your appetite. °· You develop swelling or pain in your legs. °· You develop a rash. °Get help right away if you have: °· A fever. °· Difficulty breathing. °· Sharp pains in your chest. °Summary °· After a laparoscopic appendectomy, it is common to have little energy for normal activities, mild pain in the area of the incisions, and constipation. °· Infection is the most common complication after this procedure. Follow your health care provider's instructions about caring for yourself after the procedure. °· Rest after the procedure. Return to your normal activities as told by your health care provider. °· Contact your health care provider if you notice signs of infection around your incisions or you develop shortness of breath. Get help right away if you have a fever, chest pain, or difficulty breathing. °This information is not intended to replace advice given to you by your health care provider. Make sure you discuss any questions you have with your health care provider. °Document Released: 11/01/2005 Document Revised: 05/04/2018 Document Reviewed: 05/04/2018 °Elsevier Patient Education © 2020 Elsevier Inc. ° °

## 2019-10-17 NOTE — Discharge Summary (Signed)
Physician Discharge Summary  Patient ID: OLEAN SANGSTER MRN: [id number] DOB/AGE: 06/19/1991 28 y.o.  Admit date: 10/16/2019 Discharge date: 10/17/2019  Admission Diagnoses: Acute appendicitis, right ovarian cyst  Discharge Diagnoses: Same Active Problems:   Acute appendicitis, uncomplicated   Ovarian cyst, right   S/P laparoscopic appendectomy   Discharged Condition: good  Hospital Course: Patient is a 28 year old white female who presented to the emergency room with a 36-hour history of worsening right lower quadrant abdominal pain.  CT scan of the abdomen revealed acute appendicitis and a 6 cm right simple ovarian cyst.  The patient was taken to the operating room on 10/16/2019 and underwent laparoscopic appendectomy and cyst aspiration of the right ovary.  Patient tolerated the procedures well.  Patient's postoperative course has been unremarkable.  Her diet was advanced without difficulty.  The patient is being discharged home on 10/17/2019 in good and improving condition.  Treatments: surgery: Laparoscopic appendectomy, cyst aspiration of right ovary on 10/16/2019  Discharge Exam: Blood pressure (!) 106/59, pulse (!) 57, temperature 97.8 F (36.6 C), temperature source Oral, resp. rate 16, height 5\' 6"  (1.676 m), weight 104.3 kg, last menstrual period 07/15/2019, SpO2 95 %. General appearance: alert, cooperative and no distress Resp: clear to auscultation bilaterally Cardio: regular rate and rhythm, S1, S2 normal, no murmur, click, rub or gallop GI: Soft, incisions healing well.  Disposition: Discharge disposition: 01-Home or Self Care       Discharge Instructions    Diet - low sodium heart healthy   Complete by: As directed    Increase activity slowly   Complete by: As directed      Allergies as of 10/17/2019      Reactions   Ciprofloxacin Nausea Only   Sulfa Antibiotics Diarrhea, Nausea And Vomiting   Buspar [buspirone] Anxiety   Levaquin [levofloxacin] Anxiety      Medication List    STOP taking these medications   amoxicillin-clavulanate 875-125 MG tablet Commonly known as: Augmentin   cefdinir 300 MG capsule Commonly known as: OMNICEF   dexamethasone 4 MG tablet Commonly known as: DECADRON   First-[facility] Mouthwash Susp   Flovent HFA 44 MCG/ACT inhaler Generic drug: fluticasone   predniSONE 20 MG tablet Commonly known as: DELTASONE     TAKE these medications   albuterol 108 (90 Base) MCG/ACT inhaler Commonly known as: VENTOLIN HFA Inhale 2 puffs into the lungs every 4 (four) hours as needed for wheezing or shortness of breath.   ALPRAZolam 1 MG tablet Commonly known as: XANAX 0.5-1 mg daily as needed for anxiety What changed: Another medication with the same name was removed. Continue taking this medication, and follow the directions you see here.   Aspercreme Lidocaine 4 % Liqd Generic drug: Lidocaine HCl Apply 1 application topically daily as needed (for back pain).   citalopram 40 MG tablet Commonly known as: CELEXA Take 1 tablet (40 mg total) by mouth daily.   fexofenadine 180 MG tablet Commonly known as: ALLEGRA Take 180 mg by mouth daily as needed for allergies.   HYDROcodone-acetaminophen 5-325 MG tablet Commonly known as: Norco Take 1 tablet by mouth every 4 (four) hours as needed for moderate pain.   ibuprofen 200 MG tablet Commonly known as: ADVIL Take 800 mg by mouth every 6 (six) hours as needed for mild pain or moderate pain.   JOLESSA PO Take 1 tablet by mouth daily.   levothyroxine 125 MCG tablet Commonly known as: SYNTHROID Take 1 tablet by mouth daily.   lisdexamfetamine  30 MG capsule Commonly known as: Vyvanse Take 1 capsule (30 mg total) by mouth daily. What changed: Another medication with the same name was removed. Continue taking this medication, and follow the directions you see here.      Follow-up Information    Franky Macho, MD. Call.   Specialty: General Surgery Why: Will call  you next week for follow up Contact information: [address] [place] [address] [phone]           Signed: Franky Macho 10/17/2019, 9:13 AM

## 2019-10-17 NOTE — Anesthesia Postprocedure Evaluation (Signed)
Anesthesia Post Note  Patient: Linda [name]  Procedure(s) Performed: APPENDECTOMY LAPAROSCOPIC (N/A Abdomen)  Patient location during evaluation: PACU Anesthesia Type: General Level of consciousness: awake, oriented and awake and alert Pain management: pain level controlled Vital Signs Assessment: post-procedure vital signs reviewed and stable Respiratory status: spontaneous breathing, nonlabored ventilation and patient connected to nasal cannula oxygen Cardiovascular status: blood pressure returned to baseline Postop Assessment: no headache Anesthetic complications: no     Last Vitals:  Vitals:   10/17/19 0528 10/17/19 0629  BP: (!) 95/52 (!) 106/59  Pulse: 62 (!) 57  Resp: 16   Temp: 36.6 C   SpO2: 95%     Last Pain:  Vitals:   10/17/19 0528  TempSrc: Oral  PainSc:                  Lenice Llamas

## 2019-10-18 LAB — SURGICAL PATHOLOGY

## 2019-10-25 ENCOUNTER — Telehealth: Payer: Self-pay | Admitting: *Deleted

## 2019-10-25 NOTE — Telephone Encounter (Signed)
Pt called to ask about driving after surgery. She had a lap appy last Tuesday and was wondering when she could drive a short distance. She is advised that if she is taking pain medicine, she should not be driving. She also should keep in mind if she has an accident while recuperating and in gets injured in that area of her surgery. She is advised that her responses to to anything is usually slower than normal. She is going to try to drive tomorrow to a 10 min distance. And see how she feels doing that.

## 2019-10-26 ENCOUNTER — Encounter: Payer: Self-pay | Admitting: Nurse Practitioner

## 2019-10-26 ENCOUNTER — Other Ambulatory Visit: Payer: Self-pay

## 2019-10-26 ENCOUNTER — Ambulatory Visit (INDEPENDENT_AMBULATORY_CARE_PROVIDER_SITE_OTHER): Payer: Medicaid Other | Admitting: Nurse Practitioner

## 2019-10-26 DIAGNOSIS — B370 Candidal stomatitis: Secondary | ICD-10-CM

## 2019-10-26 MED ORDER — FLUCONAZOLE 100 MG PO TABS: ORAL_TABLET | ORAL | 0 refills | Status: DC

## 2019-10-26 MED ORDER — MAGIC MOUTHWASH W/LIDOCAINE: 5.0000 mL | Freq: Four times a day (QID) | ORAL | 0 refills | Status: DC | PRN

## 2019-10-26 NOTE — Progress Notes (Signed)
  Video visit Subjective:    Patient ID: Linda [name], female    DOB: Dec 13, 1990, 28 y.o.   MRN: [id number]  HPIspot on tongue for 2 days, burns when eating anything acidic. Has not tried any treatments.   Virtual Visit via Video Note  I connected with Linda [name] on 10/26/19 at  1:20 PM EST by a video enabled telemedicine application and verified that I am speaking with the correct person using two identifiers.  Location: Patient: home Provider: office   I discussed the limitations of evaluation and management by telemedicine and the availability of in person appointments. The patient expressed understanding and agreed to proceed.  History of Present Illness: Presents for complaints of a sore tongue for the past 2 days.  Patient recently had an appendectomy which included antibiotic use.  Had a similar problem not too long ago after taking steroids.  Worse with intake of acidic or salty foods.  States her tongue feels dry.  No fever.  Also note that patient has gained quite a bit of weight.  States she has been diagnosed with Hashimoto's thyroiditis and is being followed by endocrinology, has an appointment next week.  Has been looking into gastric surgery for morbid obesity.   Observations/Objective: NAD.  Alert, oriented.  Calm affect.  A thick white film covers most of the tongue. Fasting lab work from 12/2 shows an elevated fasting glucose at 156.  Assessment and Plan:  Oral candidiasis  Meds ordered this encounter  Medications  . fluconazole (DIFLUCAN) 100 MG tablet    Sig: Take 2 tabs the first day then one po qd x 14 d    Dispense:  16 tablet    Refill:  0    Order Specific Question:   Supervising Provider    Answer:   Sallee Lange A [[id number]]  . magic mouthwash w/lidocaine SOLN    Sig: Take 5 mLs by mouth 4 (four) times daily as needed for mouth pain.    Dispense:  100 mL    Refill:  0    Order Specific Question:   Supervising Provider    Answer:   Sallee Lange A [[id number]]     Follow Up Instructions: Use Magic mouthwash as directed for discomfort.  Diflucan as directed.  Call back if worsens or persist.  Patient to look into gastric surgery and to contact her office if we can be of assistance.  Also recommend the endocrinology add an A1c to her upcoming lab work.  Patient states she will check into this.   I discussed the assessment and treatment plan with the patient. The patient was provided an opportunity to ask questions and all were answered. The patient agreed with the plan and demonstrated an understanding of the instructions.   The patient was advised to call back or seek an in-person evaluation if the symptoms worsen or if the condition fails to improve as anticipated.  I provided 15 minutes of non-face-to-face time during this encounter.        Review of Systems     Objective:   Physical Exam        Assessment & Plan:

## 2019-12-17 ENCOUNTER — Encounter: Payer: Self-pay | Admitting: Family Medicine

## 2019-12-21 DIAGNOSIS — E063 Autoimmune thyroiditis: Secondary | ICD-10-CM | POA: Diagnosis not present

## 2019-12-21 DIAGNOSIS — E038 Other specified hypothyroidism: Secondary | ICD-10-CM | POA: Diagnosis not present

## 2019-12-25 DIAGNOSIS — K760 Fatty (change of) liver, not elsewhere classified: Secondary | ICD-10-CM | POA: Diagnosis not present

## 2019-12-25 DIAGNOSIS — R7303 Prediabetes: Secondary | ICD-10-CM | POA: Diagnosis not present

## 2019-12-25 DIAGNOSIS — E559 Vitamin D deficiency, unspecified: Secondary | ICD-10-CM | POA: Diagnosis not present

## 2019-12-25 DIAGNOSIS — E538 Deficiency of other specified B group vitamins: Secondary | ICD-10-CM | POA: Diagnosis not present

## 2019-12-25 DIAGNOSIS — Z87891 Personal history of nicotine dependence: Secondary | ICD-10-CM | POA: Diagnosis not present

## 2019-12-25 DIAGNOSIS — Z136 Encounter for screening for cardiovascular disorders: Secondary | ICD-10-CM | POA: Diagnosis not present

## 2019-12-25 DIAGNOSIS — Z6841 Body Mass Index (BMI) 40.0 and over, adult: Secondary | ICD-10-CM | POA: Diagnosis not present

## 2019-12-25 DIAGNOSIS — E8881 Metabolic syndrome: Secondary | ICD-10-CM | POA: Diagnosis not present

## 2019-12-26 ENCOUNTER — Other Ambulatory Visit: Payer: Self-pay

## 2019-12-26 ENCOUNTER — Ambulatory Visit (INDEPENDENT_AMBULATORY_CARE_PROVIDER_SITE_OTHER): Payer: Medicaid Other | Admitting: Family Medicine

## 2019-12-26 ENCOUNTER — Encounter: Payer: Self-pay | Admitting: Family Medicine

## 2019-12-26 VITALS — BP 128/82 | Temp 97.5°F | Ht 65.0 in | Wt 279.6 lb

## 2019-12-26 DIAGNOSIS — R7989 Other specified abnormal findings of blood chemistry: Secondary | ICD-10-CM | POA: Diagnosis not present

## 2019-12-26 DIAGNOSIS — O280 Abnormal hematological finding on antenatal screening of mother: Secondary | ICD-10-CM

## 2019-12-26 DIAGNOSIS — O26899 Other specified pregnancy related conditions, unspecified trimester: Secondary | ICD-10-CM | POA: Diagnosis not present

## 2019-12-26 NOTE — Patient Instructions (Signed)
Vitamin B12 1000 miu once per day,

## 2019-12-26 NOTE — Progress Notes (Signed)
   Subjective:    Patient ID: Linda [name], female    DOB: [birth year], 29 y.o.   MRN: [id number]  HPI  Patient arrives to discuss recent labs and weight loss surgery.  Patient is facing bariatric surgery and has been advised she needs to get all of her metabolic numbers in working order  Patient's TSH is elevated.  Recently her endocrinologist increased her thyroid dose  Patient runs low vitamin D.  See prior notes.  Patient's bariatric specialist had recommended vitamin D3 2000 milliunits twice daily for the next 3 months  Patient has low B12.  Borderline in nature.  Was advised to see [place] to discuss potential vitamin B12 injections  Review of Systems No headache no chest pain no shortness of breath    Objective:   Physical Exam  Alert vitals stable, NAD. Blood pressure good on repeat. HEENT normal. Lungs clear. Heart regular rate and rhythm. Morbid obesity present      Assessment & Plan:  Impression 1 borderline low B12.  The vast majority time with oral supplement this normalizes.  Discussed.  Initiate vitamin B12 1000 units daily recheck in several months  2.  Low vitamin D.  Patient on D3 2000 twice daily which is appropriate.  In several months can likely back off to once daily long-term  3.  Hypothyroidism followed by her specialist  All discussed

## 2019-12-30 ENCOUNTER — Other Ambulatory Visit: Payer: Self-pay

## 2019-12-30 ENCOUNTER — Encounter (HOSPITAL_COMMUNITY): Payer: Self-pay | Admitting: *Deleted

## 2019-12-30 ENCOUNTER — Emergency Department (HOSPITAL_COMMUNITY)
Admission: EM | Admit: 2019-12-30 | Discharge: 2019-12-30 | Disposition: A | Discharge status: Home / Self Care | Payer: Medicaid Other | Attending: Emergency Medicine | Admitting: Emergency Medicine

## 2019-12-30 ENCOUNTER — Emergency Department (HOSPITAL_COMMUNITY): Payer: Medicaid Other

## 2019-12-30 DIAGNOSIS — Z79899 Other long term (current) drug therapy: Secondary | ICD-10-CM | POA: Insufficient documentation

## 2019-12-30 DIAGNOSIS — J45909 Unspecified asthma, uncomplicated: Secondary | ICD-10-CM | POA: Diagnosis not present

## 2019-12-30 DIAGNOSIS — Z87891 Personal history of nicotine dependence: Secondary | ICD-10-CM | POA: Diagnosis not present

## 2019-12-30 DIAGNOSIS — R062 Wheezing: Secondary | ICD-10-CM

## 2019-12-30 DIAGNOSIS — Z20822 Contact with and (suspected) exposure to covid-19: Secondary | ICD-10-CM | POA: Diagnosis not present

## 2019-12-30 DIAGNOSIS — E039 Hypothyroidism, unspecified: Secondary | ICD-10-CM | POA: Insufficient documentation

## 2019-12-30 DIAGNOSIS — R0602 Shortness of breath: Secondary | ICD-10-CM | POA: Diagnosis not present

## 2019-12-30 DIAGNOSIS — R05 Cough: Secondary | ICD-10-CM | POA: Diagnosis not present

## 2019-12-30 DIAGNOSIS — J069 Acute upper respiratory infection, unspecified: Secondary | ICD-10-CM | POA: Diagnosis not present

## 2019-12-30 HISTORY — DX: Hypothyroidism, unspecified: E03.9

## 2019-12-30 LAB — CBC WITH DIFFERENTIAL/PLATELET
Abs Immature Granulocytes: 0.04 10*3/uL (ref 0.00–0.07)
Basophils Absolute: 0.1 10*3/uL (ref 0.0–0.1)
Basophils Relative: 0 %
Eosinophils Absolute: 0.8 10*3/uL — ABNORMAL HIGH (ref 0.0–0.5)
Eosinophils Relative: 6 %
HCT: 42.7 % (ref 36.0–46.0)
Hemoglobin: 13.7 g/dL (ref 12.0–15.0)
Immature Granulocytes: 0 %
Lymphocytes Relative: 15 %
Lymphs Abs: 2.2 10*3/uL (ref 0.7–4.0)
MCH: 29 pg (ref 26.0–34.0)
MCHC: 32.1 g/dL (ref 30.0–36.0)
MCV: 90.5 fL (ref 80.0–100.0)
Monocytes Absolute: 0.6 10*3/uL (ref 0.1–1.0)
Monocytes Relative: 4 %
Neutro Abs: 11 10*3/uL — ABNORMAL HIGH (ref 1.7–7.7)
Neutrophils Relative %: 75 %
Platelets: 366 10*3/uL (ref 150–400)
RBC: 4.72 MIL/uL (ref 3.87–5.11)
RDW: 13.6 % (ref 11.5–15.5)
WBC: 14.6 10*3/uL — ABNORMAL HIGH (ref 4.0–10.5)
nRBC: 0 % (ref 0.0–0.2)

## 2019-12-30 LAB — BASIC METABOLIC PANEL
Anion gap: 9 (ref 5–15)
BUN: 7 mg/dL (ref 6–20)
CO2: 24 mmol/L (ref 22–32)
Calcium: 9.1 mg/dL (ref 8.9–10.3)
Chloride: 102 mmol/L (ref 98–111)
Creatinine, Ser: 0.71 mg/dL (ref 0.44–1.00)
GFR calc Af Amer: >60 mL/min (ref >60)
GFR calc non Af Amer: >60 mL/min (ref >60)
Glucose, Bld: 121 mg/dL — ABNORMAL HIGH (ref 70–99)
Potassium: 3.8 mmol/L (ref 3.5–5.1)
Sodium: 135 mmol/L (ref 135–145)

## 2019-12-30 LAB — D-DIMER, QUANTITATIVE: D-Dimer, Quant: 0.39 ug/mL-FEU (ref 0.00–0.50)

## 2019-12-30 LAB — HCG, SERUM, QUALITATIVE: Preg, Serum: NEGATIVE

## 2019-12-30 MED ORDER — PREDNISONE 20 MG PO TABS
20.0000 mg | ORAL_TABLET | Freq: Once | ORAL | Status: AC
  Administered 2019-12-30: 20 mg via ORAL
  Filled 2019-12-30: qty 1

## 2019-12-30 MED ORDER — PREDNISONE 10 MG PO TABS: 20.0000 mg | ORAL_TABLET | Freq: Two times a day (BID) | ORAL | 0 refills | Status: DC

## 2019-12-30 MED ORDER — ALBUTEROL SULFATE HFA 108 (90 BASE) MCG/ACT IN AERS
2.0000 | INHALATION_SPRAY | Freq: Once | RESPIRATORY_TRACT | Status: AC
  Administered 2019-12-30: 16:00:00 2 via RESPIRATORY_TRACT
  Filled 2019-12-30: qty 6.7

## 2019-12-30 MED ORDER — ONDANSETRON HCL 4 MG/2ML IJ SOLN
4.0000 mg | Freq: Once | INTRAMUSCULAR | Status: AC
  Administered 2019-12-30: 4 mg via INTRAVENOUS
  Filled 2019-12-30: qty 2

## 2019-12-30 NOTE — ED Provider Notes (Addendum)
[name] Provider Note   CSN: [id number] Arrival date & time: 12/30/19  1439     History Chief Complaint  Patient presents with  . Shortness of Breath    Linda [name] is a 29 y.o. female.  Patient is a 29 year old female with past medical history of asthma, irritable bowel, hypothyroidism, and anxiety.  She presents today for evaluation of shortness of breath.  Patient describes a several day history of chest congestion, dyspnea, and productive cough of green sputum.  She denies fevers or chills.  She denies ill contacts.  Patient states that her symptoms are worse when she stands and ambulates.  She has a steroid inhaler at home which she has been using with little relief.  The history is provided by the patient.  Shortness of Breath Severity:  Moderate Onset quality:  Gradual Duration:  3 days Timing:  Constant Progression:  Worsening Chronicity:  New Context: activity and URI   Relieved by:  Nothing Worsened by:  Exertion and activity Ineffective treatments:  None tried Associated symptoms: cough and sputum production   Associated symptoms: no fever        Past Medical History:  Diagnosis Date  . Anxiety   . Asthma   . Heart murmur   . Hypothyroidism   . IBS (irritable bowel syndrome)   . Interstitial cystitis     Patient Active Problem List   Diagnosis Date Noted  . S/P laparoscopic appendectomy 10/16/2019  . Acute appendicitis, uncomplicated   . Ovarian cyst, right   . Mood disorder in conditions classified elsewhere 04/06/2018  . Generalized anxiety disorder 04/06/2018  . Hypothyroidism 06/21/2014  . Interstitial cystitis 06/14/2014  . 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 Morbid obesity ([place]) 02/07/2014  . Asthma, chronic 04/15/2013  . Chronic anxiety 04/15/2013  . Cervical strain 03/07/2012  . Sprain and strain of unspecified site of shoulder and upper arm 03/07/2012  . Pain in joint,  shoulder region 03/07/2012    Past Surgical History:  Procedure Laterality Date  . APPENDECTOMY    . CESAREAN SECTION N/A 03/20/2013   Procedure: CESAREAN SECTION;  Surgeon: Logan Bores, MD;  Location: [facility];  Service: Obstetrics;  Laterality: N/A;  . CESAREAN SECTION    . LAPAROSCOPIC APPENDECTOMY N/A 10/16/2019   Procedure: APPENDECTOMY LAPAROSCOPIC;  Surgeon: Aviva Signs, MD;  Location: [facility];  Service: General;  Laterality: N/A;  . MYRINGOTOMY WITH TUBE PLACEMENT    . NO PAST SURGERIES    . TYMPANOSTOMY TUBE PLACEMENT    . UPPER GI ENDOSCOPY       OB History    Gravida  1   Para  1   Term  0   Preterm  1   AB  0   Living  1     SAB  0   TAB  0   Ectopic  0   Multiple      Live Births  1           Family History  Problem Relation Age of Onset  . Cancer Paternal Uncle   . COPD Maternal Grandfather   . Stroke Paternal Grandfather   . Depression Mother   . OCD Mother   . Bipolar disorder Paternal Aunt   . Drug abuse Maternal Uncle   . Depression Maternal Grandmother   . Anxiety disorder Maternal Grandmother     Social History   Tobacco Use  . Smoking status: Former Smoker  Packs/day: 0.50    Types: Cigarettes    Start date: 01/28/2013  . Smokeless tobacco: Never Used  Substance Use Topics  . Alcohol use: No    Alcohol/week: 0.0 standard drinks  . Drug use: No    Home Medications Prior to Admission medications   Medication Sig Start Date End Date Taking? Authorizing Provider  albuterol (VENTOLIN HFA) 108 (90 Base) MCG/ACT inhaler Inhale 2 puffs into the lungs every 4 (four) hours as needed for wheezing or shortness of breath. 03/26/19   Merlyn Albert, MD  ALPRAZolam Prudy Feeler) 1 MG tablet 0.5-1 mg daily as needed for anxiety 07/20/19   Neysa Hotter, MD  citalopram (CELEXA) 40 MG tablet Take 1 tablet (40 mg total) by mouth daily. 10/05/19   Zena Amos, MD  fexofenadine (ALLEGRA) 180 MG tablet Take 180 mg by mouth daily as needed  for allergies.     [provider]  fluconazole (DIFLUCAN) 100 MG tablet Take 2 tabs the first day then one po qd x 14 d 10/26/19   Campbell Riches, NP  HYDROcodone-acetaminophen (NORCO) 5-325 MG tablet Take 1 tablet by mouth every 4 (four) hours as needed for moderate pain. 10/17/19   Franky Macho, MD  Levonorgest-Eth Charlott Holler 91-Day (JOLESSA PO) Take 1 tablet by mouth daily.    [provider]  levothyroxine (SYNTHROID) 125 MCG tablet Take 1 tablet by mouth daily.    [provider]  lisdexamfetamine (VYVANSE) 30 MG capsule Take 1 capsule (30 mg total) by mouth daily. 10/05/19   Zena Amos, MD  magic mouthwash w/lidocaine SOLN Take 5 mLs by mouth 4 (four) times daily as needed for mouth pain. 10/26/19   Campbell Riches, NP    Allergies    Ciprofloxacin, Sulfa antibiotics, Buspar [buspirone], and Levaquin [levofloxacin]  Review of Systems   Review of Systems  Constitutional: Negative for fever.  Respiratory: Positive for cough, sputum production and shortness of breath.   All other systems reviewed and are negative.   Physical Exam Updated Vital Signs BP 123/80   Pulse 90   Temp 97.9 F (36.6 C) (Oral)   Resp 17   Ht 5\' 5"  (1.651 m)   Wt 127 kg   LMP 11/29/2019   SpO2 94%   BMI 46.59 kg/m   Physical Exam Vitals and nursing note reviewed.  Constitutional:      General: She is not in acute distress.    Appearance: She is well-developed. She is not diaphoretic.  HENT:     Head: Normocephalic and atraumatic.  Cardiovascular:     Rate and Rhythm: Normal rate and regular rhythm.     Heart sounds: No murmur. No friction rub. No gallop.   Pulmonary:     Effort: Pulmonary effort is normal. No respiratory distress.     Breath sounds: Normal breath sounds. No wheezing.  Abdominal:     General: Bowel sounds are normal. There is no distension.     Palpations: Abdomen is soft.     Tenderness: There is no abdominal tenderness.  Musculoskeletal:         General: Normal range of motion.     Cervical back: Normal range of motion and neck supple.  Skin:    General: Skin is warm and dry.  Neurological:     Mental Status: She is alert and oriented to person, place, and time.     ED Results / Procedures / Treatments   Labs (all labs ordered are listed, but only abnormal  results are displayed) Labs Reviewed - No data to display  EKG EKG Interpretation  Date/Time:  Sunday December 30 2019 15:00:04 EST Ventricular Rate:  90 PR Interval:    QRS Duration: 97 QT Interval:  368 QTC Calculation: 451 R Axis:   48 Text Interpretation: Sinus rhythm Borderline T abnormalities, anterior leads Confirmed by Geoffery Lyons ([id number]) on 12/30/2019 3:07:36 PM   Radiology No results found.  Procedures Procedures (including critical care time)  Medications Ordered in ED Medications - No data to display  ED Course  I have reviewed the triage vital signs and the nursing notes.  Pertinent labs & imaging results that were available during my care of the patient were reviewed by me and considered in my medical decision making (see chart for details).    MDM Rules/Calculators/A&P  Patient presenting here with complaints of chest congestion, cough, and wheezing.  Symptoms could be related to COVID-19.  Test is pending.  Remainder of her work-up is essentially unremarkable including laboratory studies, D-dimer, and chest x-ray.  Patient given an albuterol MDI and will be discharged with a course of prednisone.  She is to follow-up as needed if not improving/return if worsening.  ARMANDINA IMAN was evaluated in Emergency Department on 12/30/2019 for the symptoms described in the history of present illness. She was evaluated in the context of the global COVID-19 pandemic, which necessitated consideration that the patient might be at risk for infection with the SARS-CoV-2 virus that causes COVID-19. Institutional protocols and algorithms that pertain to  the evaluation of patients at risk for COVID-19 are in a state of rapid change based on information released by regulatory bodies including the CDC and federal and state organizations. These policies and algorithms were followed during the patient's care in the ED.  Final Clinical Impression(s) / ED Diagnoses Final diagnoses:  None    Rx / DC Orders ED Discharge Orders    None       Geoffery Lyons, MD 12/30/19 1656    Geoffery Lyons, MD 12/30/19 1657

## 2019-12-30 NOTE — Discharge Instructions (Addendum)
Begin taking prednisone as prescribed.  Use the albuterol inhaler you were given here in the ER.  You can use this up to 2 puffs every 4 hours as needed.  Follow-up with primary doctor if not improving in the next 4 to 5 days, and return to the ER if your symptoms significantly worsen or change.

## 2019-12-30 NOTE — ED Triage Notes (Signed)
Pt c/o SOB with exertion that started last night and has continued to worsen. Pt also c/o back pain, productive cough. Denies fever, n/v/d, loss of sense of taste or smell. Pt has hx of asthma and has used rescue inhaler and steroid inhaler with no relief.

## 2019-12-31 LAB — SARS CORONAVIRUS 2 (TAT 6-24 HRS): SARS Coronavirus 2: NEGATIVE

## 2020-01-02 DIAGNOSIS — G4719 Other hypersomnia: Secondary | ICD-10-CM | POA: Diagnosis not present

## 2020-01-02 DIAGNOSIS — J45909 Unspecified asthma, uncomplicated: Secondary | ICD-10-CM | POA: Diagnosis not present

## 2020-01-02 DIAGNOSIS — R0683 Snoring: Secondary | ICD-10-CM | POA: Diagnosis not present

## 2020-01-09 ENCOUNTER — Other Ambulatory Visit (HOSPITAL_COMMUNITY): Payer: Self-pay | Admitting: Psychiatry

## 2020-01-09 ENCOUNTER — Telehealth (HOSPITAL_COMMUNITY): Payer: Self-pay | Admitting: *Deleted

## 2020-01-09 DIAGNOSIS — Z7189 Other specified counseling: Secondary | ICD-10-CM | POA: Diagnosis not present

## 2020-01-09 MED ORDER — CITALOPRAM HYDROBROMIDE 40 MG PO TABS: 40.0000 mg | ORAL_TABLET | Freq: Every day | ORAL | 0 refills | Status: DC

## 2020-01-09 NOTE — Progress Notes (Signed)
Virtual Visit via Video Note  I connected with Linda [name] on 01/15/20 at  9:00 AM EST by a video enabled telemedicine application and verified that I am speaking with the correct person using two identifiers.   I discussed the limitations of evaluation and management by telemedicine and the availability of in person appointments. The patient expressed understanding and agreed to proceed.    I discussed the assessment and treatment plan with the patient. The patient was provided an opportunity to ask questions and all were answered. The patient agreed with the plan and demonstrated an understanding of the instructions.   The patient was advised to call back or seek an in-person evaluation if the symptoms worsen or if the condition fails to improve as anticipated.  I provided 15 minutes of non-face-to-face time during this encounter.   Norman Clay, MD    [facility] MD/PA/NP OP Progress Note  01/15/2020 9:30 AM BRAILYN KILLION  MRN:  [id number]  Chief Complaint:  Chief Complaint    Depression; Follow-up     HPI:  This is a follow-up appointment for depression and ADHD.  She states that she has been doing well.  She works from home, working for [employer] since last November.  She reports good relationship with her son.  She is hoping to get gastric bypass surgery as she wants to be healthy for her family.  She does regular online exercise.  She has initial insomnia.  She will be evaluated for sleep apnea.  She has good motivation and energy.  She denies feeling depressed.  She denies anxiety except that she feels nervous about gastric bypass surgery.  There was also a time she feels more anxious when she stopped smoking. She denies panic attacks. She denies alcohol or drug use. She reports  good concentration as long as she is on Vyvanse.    Visit Diagnosis:    ICD-10-CM   1. MDD (major depressive disorder), recurrent, in full remission ([place])  F33.42   2. ADD (attention  deficit disorder) without hyperactivity  F98.8   3. Generalized anxiety disorder  F41.1     Past Psychiatric History: Please see initial evaluation for full details. I have reviewed the history. No updates at this time.     Past Medical History:  Past Medical History:  Diagnosis Date  . Anxiety   . Asthma   . Heart murmur   . Hypothyroidism   . IBS (irritable bowel syndrome)   . Interstitial cystitis     Past Surgical History:  Procedure Laterality Date  . APPENDECTOMY    . CESAREAN SECTION N/A 03/20/2013   Procedure: CESAREAN SECTION;  Surgeon: Logan Bores, MD;  Location: [facility];  Service: Obstetrics;  Laterality: N/A;  . CESAREAN SECTION    . LAPAROSCOPIC APPENDECTOMY N/A 10/16/2019   Procedure: APPENDECTOMY LAPAROSCOPIC;  Surgeon: Aviva Signs, MD;  Location: [facility];  Service: General;  Laterality: N/A;  . MYRINGOTOMY WITH TUBE PLACEMENT    . NO PAST SURGERIES    . TYMPANOSTOMY TUBE PLACEMENT    . UPPER GI ENDOSCOPY      Family Psychiatric History: Please see initial evaluation for full details. I have reviewed the history. No updates at this time.     Family History:  Family History  Problem Relation Age of Onset  . Cancer Paternal Uncle   . COPD Maternal Grandfather   . Stroke Paternal Grandfather   . Depression Mother   . OCD Mother   .  Bipolar disorder Paternal Aunt   . Drug abuse Maternal Uncle   . Depression Maternal Grandmother   . Anxiety disorder Maternal Grandmother     Social History:  Social History   Socioeconomic History  . Marital status: Single    Spouse name: Not on file  . Number of children: Not on file  . Years of education: Not on file  . Highest education level: Not on file  Occupational History  . Not on file  Tobacco Use  . Smoking status: Former Smoker    Packs/day: 0.50    Types: Cigarettes    Start date: 01/28/2013  . Smokeless tobacco: Never Used  Substance and Sexual Activity  . Alcohol use: No    Alcohol/week:  0.0 standard drinks  . Drug use: No  . Sexual activity: Yes    Partners: Male    Birth control/protection: None  Other Topics Concern  . Not on file  Social History Narrative   ** Merged History Encounter **       Social Determinants of Health   Financial Resource Strain:   . Difficulty of Paying Living Expenses: Not on file  Food Insecurity:   . Worried About Programme researcher, broadcasting/film/video in the Last Year: Not on file  . Ran Out of Food in the Last Year: Not on file  Transportation Needs:   . Lack of Transportation (Medical): Not on file  . Lack of Transportation (Non-Medical): Not on file  Physical Activity:   . Days of Exercise per Week: Not on file  . Minutes of Exercise per Session: Not on file  Stress:   . Feeling of Stress : Not on file  Social Connections:   . Frequency of Communication with Friends and Family: Not on file  . Frequency of Social Gatherings with Friends and Family: Not on file  . Attends Religious Services: Not on file  . Active Member of Clubs or Organizations: Not on file  . Attends Banker Meetings: Not on file  . Marital Status: Not on file    Allergies:  Allergies  Allergen Reactions  . Ciprofloxacin Nausea Only  . Sulfa Antibiotics Diarrhea and Nausea And Vomiting  . Buspar [Buspirone] Anxiety  . Levaquin [Levofloxacin] Anxiety    Metabolic Disorder Labs: Lab Results  Component Value Date   HGBA1C 5.0 11/24/2016   No results found for: PROLACTIN No results found for: CHOL, TRIG, HDL, CHOLHDL, VLDL, LDLCALC Lab Results  Component Value Date   TSH 28.86 (H) 07/26/2018   TSH 4.370 11/24/2016    Therapeutic Level Labs: No results found for: LITHIUM No results found for: VALPROATE No components found for:  CBMZ  Current Medications: Current Outpatient Medications  Medication Sig Dispense Refill  . albuterol (VENTOLIN HFA) 108 (90 Base) MCG/ACT inhaler Inhale 2 puffs into the lungs every 4 (four) hours as needed for wheezing  or shortness of breath. 1 Inhaler 1  . ALPRAZolam (XANAX) 1 MG tablet 0.5-1 mg daily as needed for anxiety 30 tablet 2  . citalopram (CELEXA) 40 MG tablet Take 1 tablet (40 mg total) by mouth daily. 90 tablet 0  . fexofenadine (ALLEGRA) 180 MG tablet Take 180 mg by mouth daily as needed for allergies.     . fluconazole (DIFLUCAN) 100 MG tablet Take 2 tabs the first day then one po qd x 14 d 16 tablet 0  . HYDROcodone-acetaminophen (NORCO) 5-325 MG tablet Take 1 tablet by mouth every 4 (four) hours as needed for  moderate pain. 30 tablet 0  . Levonorgest-Eth Estrad 91-Day (JOLESSA PO) Take 1 tablet by mouth daily.    Marland Kitchen levothyroxine (SYNTHROID) 125 MCG tablet Take 1 tablet by mouth daily.    Marland Kitchen lisdexamfetamine (VYVANSE) 30 MG capsule Take 1 capsule (30 mg total) by mouth daily. 30 capsule 0  . [START ON 02/02/2020] lisdexamfetamine (VYVANSE) 30 MG capsule Take 1 capsule (30 mg total) by mouth daily before breakfast. 30 capsule 0  . [START ON 03/03/2020] lisdexamfetamine (VYVANSE) 30 MG capsule Take 1 capsule (30 mg total) by mouth daily before breakfast. 30 capsule 0  . [START ON 04/02/2020] lisdexamfetamine (VYVANSE) 30 MG capsule Take 1 capsule (30 mg total) by mouth daily before breakfast. 30 capsule 0  . magic mouthwash w/lidocaine SOLN Take 5 mLs by mouth 4 (four) times daily as needed for mouth pain. 100 mL 0  . predniSONE (DELTASONE) 10 MG tablet Take 2 tablets (20 mg total) by mouth 2 (two) times daily. 20 tablet 0   No current facility-administered medications for this visit.     Musculoskeletal: Strength & Muscle Tone: N/A Gait & Station: N/A Patient leans: N/A  Psychiatric Specialty Exam: Review of Systems  Psychiatric/Behavioral: Positive for sleep disturbance. Negative for agitation, behavioral problems, confusion, decreased concentration, dysphoric mood, hallucinations, self-injury and suicidal ideas. The patient is nervous/anxious. The patient is not hyperactive.   All other  systems reviewed and are negative.   There were no vitals taken for this visit.There is no height or weight on file to calculate BMI.  General Appearance: Fairly Groomed  Eye Contact:  Good  Speech:  Clear and Coherent  Volume:  Normal  Mood:  "good"  Affect:  Appropriate, Congruent and euthymic  Thought Process:  Coherent  Orientation:  Full (Time, Place, and Person)  Thought Content: Logical   Suicidal Thoughts:  No  Homicidal Thoughts:  No  Memory:  Immediate;   Good  Judgement:  Good  Insight:  Fair  Psychomotor Activity:  Normal  Concentration:  Concentration: Good and Attention Span: Good  Recall:  Good  Fund of Knowledge: Good  Language: Good  Akathisia:  No  Handed:  Right  AIMS (if indicated): not done  Assets:  Communication Skills Desire for Improvement  ADL's:  Intact  Cognition: WNL  Sleep:  Fair   Screenings: GAD-7     Office Visit from 03/07/2018 in [facility] Office Visit from 11/22/2017 in [facility]  Total GAD-7 Score  14  15    PHQ2-9     Office Visit from 03/07/2018 in [facility]  PHQ-2 Total Score  5  PHQ-9 Total Score  18       Assessment and Plan:  AMANIE MCCULLEY is a 29 y.o. year old female with a history of mood disorder, anxiety , who presents for follow up appointment for MDD (major depressive disorder), recurrent, in full remission ([facility])  ADD (attention deficit disorder) without hyperactivity  Generalized anxiety disorder  # MDD in full remission # Generalized anxiety disorder # Panic disorder Although exam may be limited given patient has not seen since last July, she denies significant mood symptoms since the last appointment.  We will continue citalopram to target depression and anxiety.  We will continue Xanax as needed for anxiety.  Discussed risk of dependence and oversedation.   # ADHD (Patient has history of ADHD since age 51. She reportedly underwent neuropsychological  evaluation) She has had good response to Vyvanse.  We  will continue Vyvanse to target ADHD/inattention.  Discussed potential side effects, which includes but not limited to decreased appetite, headache, and worsening in anxiety.   Plan I have reviewed and updated plans as below 1. Continue citalopram 40 mg daily  2. Continue Vyvanse 30 mg daily  3. Continue Xanax 1 mg daily as needed for anxiety 5. Next appointment: 5/18 at 8:40 for 20 mins, video - on hydrocodone - She will bring the form for her to pursue gastric bypass surgery. She is informed/agreed that she may need re-evaluation depending on the form.   Past trials of medication:citalopram, lithium, Depakote, Abilify (visual impairment),xanax, ativan, clonazepam, adderall,Ritalin, Concerta,  The patient demonstrates the following risk factors for suicide: Chronic risk factors for suicide include:psychiatric disorder ofanxiety. Acute risk factorsfor suicide include: N/A. Protective factorsfor this patient include: positive social support, responsibility to others (children, family), coping skills and hope for the future. Considering these factors, the overall suicide    Neysa Hotter, MD 01/15/2020, 9:30 AM

## 2020-01-09 NOTE — Telephone Encounter (Signed)
Patient called to schedule appt with front office for 01/15/20. And then transferred to me & stated Rx informed refill on  citalopram (CELEXA) 40 MG tablet sent but not filled by provider. Patient states she only has 2 tabs left .  Patient has been informed of her No Shows & reminded of the attendance policy

## 2020-01-09 NOTE — Telephone Encounter (Signed)
ordered

## 2020-01-15 ENCOUNTER — Encounter (HOSPITAL_COMMUNITY): Payer: Self-pay | Admitting: Psychiatry

## 2020-01-15 ENCOUNTER — Other Ambulatory Visit: Payer: Self-pay

## 2020-01-15 ENCOUNTER — Ambulatory Visit (INDEPENDENT_AMBULATORY_CARE_PROVIDER_SITE_OTHER): Payer: Medicaid Other | Admitting: Psychiatry

## 2020-01-15 DIAGNOSIS — F3342 Major depressive disorder, recurrent, in full remission: Secondary | ICD-10-CM

## 2020-01-15 DIAGNOSIS — F411 Generalized anxiety disorder: Secondary | ICD-10-CM

## 2020-01-15 DIAGNOSIS — F988 Other specified behavioral and emotional disorders with onset usually occurring in childhood and adolescence: Secondary | ICD-10-CM | POA: Diagnosis not present

## 2020-01-15 MED ORDER — ALPRAZOLAM 1 MG PO TABS: ORAL_TABLET | ORAL | 2 refills | Status: DC

## 2020-01-15 MED ORDER — LISDEXAMFETAMINE DIMESYLATE 30 MG PO CAPS: 30.0000 mg | ORAL_CAPSULE | Freq: Every day | ORAL | 0 refills | Status: DC

## 2020-01-15 NOTE — Patient Instructions (Signed)
1. Continue citalopram 40 mg daily  2. Continue Vyvanse 30 mg daily  3. Continue Xanax 1 mg daily as needed for anxiety 5. Next appointment: 5/18 at 8:40

## 2020-01-17 DIAGNOSIS — Z713 Dietary counseling and surveillance: Secondary | ICD-10-CM | POA: Diagnosis not present

## 2020-01-22 DIAGNOSIS — E559 Vitamin D deficiency, unspecified: Secondary | ICD-10-CM | POA: Diagnosis not present

## 2020-01-22 DIAGNOSIS — E538 Deficiency of other specified B group vitamins: Secondary | ICD-10-CM | POA: Diagnosis not present

## 2020-01-23 ENCOUNTER — Encounter (HOSPITAL_COMMUNITY): Payer: Self-pay | Admitting: Psychiatry

## 2020-01-26 DIAGNOSIS — G4733 Obstructive sleep apnea (adult) (pediatric): Secondary | ICD-10-CM | POA: Diagnosis not present

## 2020-01-27 DIAGNOSIS — E669 Obesity, unspecified: Secondary | ICD-10-CM | POA: Diagnosis not present

## 2020-01-27 DIAGNOSIS — G4719 Other hypersomnia: Secondary | ICD-10-CM | POA: Diagnosis not present

## 2020-02-11 DIAGNOSIS — Z6841 Body Mass Index (BMI) 40.0 and over, adult: Secondary | ICD-10-CM | POA: Diagnosis not present

## 2020-02-11 DIAGNOSIS — Z01812 Encounter for preprocedural laboratory examination: Secondary | ICD-10-CM | POA: Diagnosis not present

## 2020-02-11 DIAGNOSIS — Z20822 Contact with and (suspected) exposure to covid-19: Secondary | ICD-10-CM | POA: Diagnosis not present

## 2020-02-13 DIAGNOSIS — K219 Gastro-esophageal reflux disease without esophagitis: Secondary | ICD-10-CM | POA: Diagnosis not present

## 2020-02-21 DIAGNOSIS — K760 Fatty (change of) liver, not elsewhere classified: Secondary | ICD-10-CM | POA: Diagnosis not present

## 2020-02-21 DIAGNOSIS — Z713 Dietary counseling and surveillance: Secondary | ICD-10-CM | POA: Diagnosis not present

## 2020-02-25 ENCOUNTER — Other Ambulatory Visit: Payer: Self-pay

## 2020-02-25 ENCOUNTER — Ambulatory Visit: Payer: Medicaid Other | Attending: Internal Medicine

## 2020-02-25 DIAGNOSIS — Z20822 Contact with and (suspected) exposure to covid-19: Secondary | ICD-10-CM

## 2020-02-27 ENCOUNTER — Other Ambulatory Visit: Payer: Self-pay

## 2020-02-27 ENCOUNTER — Encounter (INDEPENDENT_AMBULATORY_CARE_PROVIDER_SITE_OTHER): Payer: Medicaid Other | Admitting: Family Medicine

## 2020-02-27 LAB — NOVEL CORONAVIRUS, NAA: SARS-CoV-2, NAA: NOT DETECTED

## 2020-02-27 LAB — SARS-COV-2, NAA 2 DAY TAT

## 2020-02-28 ENCOUNTER — Encounter: Payer: Self-pay | Admitting: Family Medicine

## 2020-02-28 ENCOUNTER — Ambulatory Visit (INDEPENDENT_AMBULATORY_CARE_PROVIDER_SITE_OTHER): Payer: Medicaid Other | Admitting: Family Medicine

## 2020-02-28 DIAGNOSIS — Z20822 Contact with and (suspected) exposure to covid-19: Secondary | ICD-10-CM | POA: Diagnosis not present

## 2020-02-28 DIAGNOSIS — J0110 Acute frontal sinusitis, unspecified: Secondary | ICD-10-CM

## 2020-02-28 DIAGNOSIS — R430 Anosmia: Secondary | ICD-10-CM | POA: Diagnosis not present

## 2020-02-28 MED ORDER — FLUTICASONE PROPIONATE 50 MCG/ACT NA SUSP: 2.0000 | Freq: Every day | NASAL | 0 refills | Status: DC

## 2020-02-28 MED ORDER — AMOXICILLIN 500 MG PO CAPS: 500.0000 mg | ORAL_CAPSULE | Freq: Three times a day (TID) | ORAL | 0 refills | Status: DC

## 2020-02-28 NOTE — Progress Notes (Signed)
Subjective:    Patient ID: Linda [name], female    DOB: February 04, 1991, 29 y.o.   MRN: [id number]    Virtual Visit via Video Note  I connected with [name] on 02/28/20 at 10:30 AM EDT by a video enabled telemedicine application and verified that I am speaking with the correct person using two identifiers.  Location: Patient: home Provider: office   I discussed the limitations of evaluation and management by telemedicine and the availability of in person appointments. The patient expressed understanding and agreed to proceed.  HPI  Patient stated she has headache and loss of tastes and smell for few days. The patient states she feels terrible. Patient states she found out her son's babysitter from last week tested positive for covid and then her, her boyfriend and son all got sick over the weekend. Patient states she just got results and they were negative but still concerned she has Covid.  Babysitter was positive for covid 4 days ago. Pt, boyfriend, and son- got tested and all is negative from 4 days ago testing at the time everyone was asymptomatic.   Pt has been out of work and son out of school since last Friday.   Having congested, coughing, and raspy throat. Son has sniffles, h/o allergies and both on zyrtec.  Some eye watering for pt. Quit smoking 2 months ago. No fever. No v/d.  Productive coughing- Colored mucous with some greenish color. Sinus pain/pressure and temporal area. Taking Goody powder for headaches prn.   Review of Systems  Constitutional: Negative for chills and fever.  HENT: Positive for sinus pressure, sinus pain and sore throat (raspy throat). Negative for congestion, ear discharge, ear pain, postnasal drip, rhinorrhea and sneezing.   Eyes: Negative for pain, discharge, redness and itching.  Respiratory: Positive for cough. Negative for shortness of breath and wheezing.   Cardiovascular: Negative for chest pain and leg swelling.    Gastrointestinal: Negative for abdominal pain, diarrhea, nausea and vomiting.  Genitourinary: Negative for dysuria and frequency.  Musculoskeletal: Negative for arthralgias and back pain.  Skin: Negative for rash.  Allergic/Immunologic: Positive for environmental allergies.  Neurological: Positive for headaches. Negative for dizziness and weakness.       Objective:   Physical Exam  No PE due to phone visit.      Assessment & Plan:   1. Acute non-recurrent frontal sinusitis - amoxicillin (AMOXIL) 500 MG capsule; Take 1 capsule (500 mg total) by mouth 3 (three) times daily.  Dispense: 30 capsule; Refill: 0 - fluticasone (FLONASE) 50 MCG/ACT nasal spray; Place 2 sprays into both nostrils daily.  Dispense: 16 g; Refill: 0  2. Exposure to COVID-19 virus  3. Anosmia  Gave watch and wait script for amoxicillin to take for sinusitis- if worsening sinus pain/pressure she can start meds.  Otherwise cont zyrtec, humidifier, tylenol/ibuprofen, and flonase. Advising pt needing to re-test to be able to return to work, since the day of covid testing pt was asymptomatic.  Now with symptoms needing to re-test.  Needs a work note. 02/22/20- 4/16.  Has been out of work whole week.  F/u if not improving or worsening symptoms.  Pt in agreement.    Follow Up Instructions:    I discussed the assessment and treatment plan with the patient. The patient was provided an opportunity to ask questions and all were answered. The patient agreed with the plan and demonstrated an understanding of the instructions.   The patient was advised to call back  or seek an in-person evaluation if the symptoms worsen or if the condition fails to improve as anticipated.  I provided 15 minutes of non-face-to-face time during this encounter.

## 2020-02-29 ENCOUNTER — Other Ambulatory Visit: Payer: Self-pay

## 2020-02-29 ENCOUNTER — Ambulatory Visit: Payer: Medicaid Other | Attending: Internal Medicine

## 2020-02-29 DIAGNOSIS — Z20822 Contact with and (suspected) exposure to covid-19: Secondary | ICD-10-CM

## 2020-03-01 LAB — NOVEL CORONAVIRUS, NAA: SARS-CoV-2, NAA: NOT DETECTED

## 2020-03-01 LAB — SARS-COV-2, NAA 2 DAY TAT

## 2020-03-14 ENCOUNTER — Other Ambulatory Visit: Payer: Self-pay

## 2020-03-14 ENCOUNTER — Telehealth (INDEPENDENT_AMBULATORY_CARE_PROVIDER_SITE_OTHER): Payer: Medicaid Other | Admitting: Nurse Practitioner

## 2020-03-14 DIAGNOSIS — K760 Fatty (change of) liver, not elsewhere classified: Secondary | ICD-10-CM

## 2020-03-14 DIAGNOSIS — R160 Hepatomegaly, not elsewhere classified: Secondary | ICD-10-CM | POA: Diagnosis not present

## 2020-03-14 DIAGNOSIS — R21 Rash and other nonspecific skin eruption: Secondary | ICD-10-CM | POA: Diagnosis not present

## 2020-03-14 MED ORDER — DOXYCYCLINE HYCLATE 100 MG PO TABS: 100.0000 mg | ORAL_TABLET | Freq: Two times a day (BID) | ORAL | 0 refills | Status: DC

## 2020-03-14 NOTE — Progress Notes (Signed)
Subjective:    Patient ID: Linda [name], female    DOB: Mar 29, 1991, 29 y.o.   MRN: [id number]  HPI Patient calls in today to discuss medication changes and refills that she only wants to discuss with Hoyle Sauer.   Virtual Visit via Video Note  I connected with Linda [name] on 03/14/20 at 11:00 AM EDT by a video enabled telemedicine application and verified that I am speaking with the correct person using two identifiers.  Location: Patient: home Provider: office   I discussed the limitations of evaluation and management by telemedicine and the availability of in person appointments. The patient expressed understanding and agreed to proceed.  History of Present Illness: Presents to discuss gastric surgery. She has completed work up. Supposed to be talking to surgeon this week to set up surgery, but she feels she is being rushed and is not prepared. Has met with dietician. Has increased her exercise including 7 minute workout but has noticed minimal change in her weight. Does not feel she is prepared mentally for surgery. Is being followed by surgeon in [place]. Has been diagnosed with prediabetes and fatty liver (noted on CT scan) so she wants to get this under control.  Also had a lesion in the gluteal cleft that had to have I&D. Since then has had a recurrent rash about every month in the same area on her leg. Describes small pus bumps.  Denies pregnancy and has no plans at this time.  Continues follow up with mental health.   Observations/Objective: Today's visit was via telephone Physical exam was not possible for this visit Alert, oriented. Thoughts logical coherent and relevant.   Assessment and Plan: Problem List Items Addressed This Visit      Digestive   Hepatic steatosis   Hepatomegaly     Other   Morbid obesity ([place]) - Primary    Other Visit Diagnoses    Rash and nonspecific skin eruption         Meds ordered this encounter  Medications  .  doxycycline (VIBRA-TABS) 100 MG tablet    Sig: Take 1 tablet (100 mg total) by mouth 2 (two) times daily. Prn skin infection; do not take if pregnant    Dispense:  60 tablet    Refill:  0    Order Specific Question:   Supervising Provider    Answer:   Sallee Lange A [[id number]]      Follow Up Instructions: Based on her description and with no pictures of rash, this appears to be possible MRSA. Discussed usual course and warning signs. Use Dial antibacterial soap on the area. Take Doxycyline as directed prn. Wash hands after touching affected area. Expect gradual resolution. Recommend follow up in one month to recheck patient's progress. Set one diet and one activity goal today. Will see if she feels more comfortable with her decision for surgery at that time. Because of limited mental health services available especially for Medicaid, recommend that patient continue with current provider. Return in about 1 month (around 04/13/2020).    I discussed the assessment and treatment plan with the patient. The patient was provided an opportunity to ask questions and all were answered. The patient agreed with the plan and demonstrated an understanding of the instructions.   The patient was advised to call back or seek an in-person evaluation if the symptoms worsen or if the condition fails to improve as anticipated.  I provided 25 minutes of non-face-to-face time during this encounter.  Review of  Systems     Objective:   Physical Exam        Assessment & Plan:

## 2020-03-16 ENCOUNTER — Encounter: Payer: Self-pay | Admitting: Nurse Practitioner

## 2020-03-16 DIAGNOSIS — R160 Hepatomegaly, not elsewhere classified: Secondary | ICD-10-CM | POA: Insufficient documentation

## 2020-03-16 DIAGNOSIS — K760 Fatty (change of) liver, not elsewhere classified: Secondary | ICD-10-CM | POA: Insufficient documentation

## 2020-03-24 ENCOUNTER — Encounter: Payer: Self-pay | Admitting: Nurse Practitioner

## 2020-03-26 NOTE — Telephone Encounter (Signed)
Can you please call her psychiatrist and verify this information? Thanks, [name]

## 2020-03-27 NOTE — Telephone Encounter (Signed)
Patient notified : Per Dr Bing Matter office: Patient has an active appointment wit Dr Vanetta Shawl 04/01/20 at 8:40 am and that appt was made at her last visit in March- They have no record of dismissal or missed appointment on their end and she needs to make sure she keeps her appt next week.  Patient verbalized understanding.

## 2020-03-27 NOTE — Telephone Encounter (Signed)
Please tell patient that she is fine and to keep appointment next week as planned. Thanks.

## 2020-04-01 ENCOUNTER — Telehealth (INDEPENDENT_AMBULATORY_CARE_PROVIDER_SITE_OTHER): Payer: Medicaid Other | Admitting: Psychiatry

## 2020-04-01 ENCOUNTER — Encounter (HOSPITAL_COMMUNITY): Payer: Self-pay | Admitting: Psychiatry

## 2020-04-01 ENCOUNTER — Other Ambulatory Visit: Payer: Self-pay

## 2020-04-01 DIAGNOSIS — F330 Major depressive disorder, recurrent, mild: Secondary | ICD-10-CM

## 2020-04-01 DIAGNOSIS — F411 Generalized anxiety disorder: Secondary | ICD-10-CM

## 2020-04-01 DIAGNOSIS — F909 Attention-deficit hyperactivity disorder, unspecified type: Secondary | ICD-10-CM

## 2020-04-01 MED ORDER — VENLAFAXINE HCL ER 37.5 MG PO TB24: ORAL_TABLET | ORAL | 0 refills | Status: DC

## 2020-04-01 MED ORDER — VENLAFAXINE HCL ER 75 MG PO TB24: 75.0000 mg | ORAL_TABLET | Freq: Every day | ORAL | 1 refills | Status: DC

## 2020-04-01 NOTE — Progress Notes (Signed)
Virtual Visit via Video Note  I connected with Linda [name] on 04/01/20 at  8:40 AM EDT by a video enabled telemedicine application and verified that I am speaking with the correct [name] using two identifiers.   I discussed the limitations of evaluation and management by telemedicine and the availability of in [name] appointments. The patient expressed understanding and agreed to proceed.      I discussed the assessment and treatment plan with the patient. The patient was provided an opportunity to ask questions and all were answered. The patient agreed with the plan and demonstrated an understanding of the instructions.   The patient was advised to call back or seek an in-[name] evaluation if the symptoms worsen or if the condition fails to improve as anticipated.  Interview was done at the following location.  Patient- home, Provider- office   I provided 15 minutes of non-face-to-face time during this encounter.   Neysa Hotter, MD    [facility] MD/PA/NP progress Note  04/01/2020 9:23 AM Linda [name]  MRN:  [id number]  Chief Complaint:  Chief Complaint    Depression; Follow-up     HPI:  This is a follow-up appointment for depression and anxiety, ADHD.  She states that she discontinued Lexapro about a week ago as she felt she thought it is contributing to her weight gain, and she felt fatigue after taking the medication. She reportedly gained weight significantly over 7 years since she has been on this medication, although she denies recent weight gain. She has "sensory overload" since then, and has worsening in anxiety and insomnia.  She is interested in trying other psychotropics.  She underwent endoscopy for pre-op evaluation for bariatric surgery. She was found to have some bleeding in her stomach secondary to medication, and it scared her to proceed with the surgery. She decided to work on diet first.  She denies any concerns at work.  She feels safe and calmer when she is  with her son. She has initial and middle insomnia.  She feels down.  She has fair concentration.  She has decreased appetite; she takes 1 meal and snack per day.  She denies SI.  She feels anxious and tense.  She feels irritable.  She has had panic attacks. She underwent sleep study; according to the patient, she did not have OSA but has increased sleep latency without known cause. She is taking xanax more frequently, although she denies taking it every day.    Visit Diagnosis:    ICD-10-CM   1. MDD (major depressive disorder), recurrent episode, mild (HCC)  F33.0   2. GAD (generalized anxiety disorder)  F41.1   3. Attention deficit hyperactivity disorder (ADHD), unspecified ADHD type  F90.9     Past Psychiatric History: Please see initial evaluation for full details. I have reviewed the history. No updates at this time.     Past Medical History:  Past Medical History:  Diagnosis Date  . Anxiety   . Asthma   . Heart murmur   . Hypothyroidism   . IBS (irritable bowel syndrome)   . Interstitial cystitis     Past Surgical History:  Procedure Laterality Date  . APPENDECTOMY    . CESAREAN SECTION N/A 03/20/2013   Procedure: CESAREAN SECTION;  Surgeon: [name], MD;  Location: [facility];  Service: Obstetrics;  Laterality: N/A;  . CESAREAN SECTION    . LAPAROSCOPIC APPENDECTOMY N/A 10/16/2019   Procedure: APPENDECTOMY LAPAROSCOPIC;  Surgeon: Franky Macho, MD;  Location: [facility];  Service: General;  Laterality: N/A;  . MYRINGOTOMY WITH TUBE PLACEMENT    . NO PAST SURGERIES    . TYMPANOSTOMY TUBE PLACEMENT    . UPPER GI ENDOSCOPY      Family Psychiatric History: Please see initial evaluation for full details. I have reviewed the history. No updates at this time.     Family History:  Family History  Problem Relation Age of Onset  . Cancer Paternal Uncle   . COPD Maternal Grandfather   . Stroke Paternal Grandfather   . Depression Mother   . OCD Mother   . Bipolar disorder  Paternal Aunt   . Drug abuse Maternal Uncle   . Depression Maternal Grandmother   . Anxiety disorder Maternal Grandmother     Social History:  Social History   Socioeconomic History  . Marital status: Single    Spouse name: Not on file  . Number of children: Not on file  . Years of education: Not on file  . Highest education level: Not on file  Occupational History  . Not on file  Tobacco Use  . Smoking status: Former Smoker    Packs/day: 0.50    Types: Cigarettes    Start date: 01/28/2013  . Smokeless tobacco: Never Used  Substance and Sexual Activity  . Alcohol use: No    Alcohol/week: 0.0 standard drinks  . Drug use: No  . Sexual activity: Yes    Partners: Male    Birth control/protection: None  Other Topics Concern  . Not on file  Social History Narrative   ** Merged History Encounter **       Social Determinants of Health   Financial Resource Strain:   . Difficulty of Paying Living Expenses:   Food Insecurity:   . Worried About Programme researcher, broadcasting/film/video in the Last Year:   . Barista in the Last Year:   Transportation Needs:   . Freight forwarder (Medical):   Marland Kitchen Lack of Transportation (Non-Medical):   Physical Activity:   . Days of Exercise per Week:   . Minutes of Exercise per Session:   Stress:   . Feeling of Stress :   Social Connections:   . Frequency of Communication with Friends and Family:   . Frequency of Social Gatherings with Friends and Family:   . Attends Religious Services:   . Active Member of Clubs or Organizations:   . Attends Banker Meetings:   Marland Kitchen Marital Status:     Allergies:  Allergies  Allergen Reactions  . Ciprofloxacin Nausea Only  . Sulfa Antibiotics Diarrhea and Nausea And Vomiting  . Buspar [Buspirone] Anxiety  . Levaquin [Levofloxacin] Anxiety    Metabolic Disorder Labs: Lab Results  Component Value Date   HGBA1C 5.0 11/24/2016   No results found for: PROLACTIN No results found for: CHOL, TRIG,  HDL, CHOLHDL, VLDL, LDLCALC Lab Results  Component Value Date   TSH 28.86 (H) 07/26/2018   TSH 4.370 11/24/2016    Therapeutic Level Labs: No results found for: LITHIUM No results found for: VALPROATE No components found for:  CBMZ  Current Medications: Current Outpatient Medications  Medication Sig Dispense Refill  . albuterol (VENTOLIN HFA) 108 (90 Base) MCG/ACT inhaler Inhale 2 puffs into the lungs every 4 (four) hours as needed for wheezing or shortness of breath. 1 Inhaler 1  . ALPRAZolam (XANAX) 1 MG tablet 0.5-1 mg daily as needed for anxiety 30 tablet 2  . doxycycline (VIBRA-TABS) 100  MG tablet Take 1 tablet (100 mg total) by mouth 2 (two) times daily. Prn skin infection; do not take if pregnant 60 tablet 0  . fexofenadine (ALLEGRA) 180 MG tablet Take 180 mg by mouth daily as needed for allergies.     . fluticasone (FLONASE) 50 MCG/ACT nasal spray Place 2 sprays into both nostrils daily. 16 g 0  . levothyroxine (SYNTHROID) 125 MCG tablet Take 1 tablet by mouth daily.    . Venlafaxine HCl 37.5 MG TB24 37.5 mg daily for one week 7 tablet 0  . Venlafaxine HCl 75 MG TB24 Take 1 tablet (75 mg total) by mouth daily. 30 tablet 1   No current facility-administered medications for this visit.     Musculoskeletal: Strength & Muscle Tone: N/A Gait & Station: N/A Patient leans: N/A  Psychiatric Specialty Exam: Review of Systems  Psychiatric/Behavioral: Positive for dysphoric mood and sleep disturbance. Negative for agitation, behavioral problems, confusion, decreased concentration, hallucinations, self-injury and suicidal ideas. The patient is nervous/anxious. The patient is not hyperactive.   All other systems reviewed and are negative.   There were no vitals taken for this visit.There is no height or weight on file to calculate BMI.  General Appearance: Fairly Groomed  Eye Contact:  Good  Speech:  Clear and Coherent  Volume:  Normal  Mood:  "all over the place"  Affect:   Appropriate, Congruent and Restricted  Thought Process:  Coherent  Orientation:  Full (Time, Place, and [name])  Thought Content: Logical   Suicidal Thoughts:  No  Homicidal Thoughts:  No  Memory:  Immediate;   Good  Judgement:  Good  Insight:  Fair  Psychomotor Activity:  Normal  Concentration:  Concentration: Good and Attention Span: Good  Recall:  Good  Fund of Knowledge: Good  Language: Good  Akathisia:  No  Handed:  Right  AIMS (if indicated): not done  Assets:  Communication Skills Desire for Improvement  ADL's:  Intact  Cognition: WNL  Sleep:  Poor   Screenings: GAD-7     Office Visit from 03/07/2018 in [facility] Office Visit from 11/22/2017 in [facility]  Total GAD-7 Score  14  15    PHQ2-9     Office Visit from 03/07/2018 in [facility]  PHQ-2 Total Score  5  PHQ-9 Total Score  18       Assessment and Plan:  Linda [name] is a 29 y.o. year old female with a history of depression, anxiety, ADHD , who presents for follow up appointment for MDD (major depressive disorder), recurrent episode, mild (HCC)  GAD (generalized anxiety disorder)  Attention deficit hyperactivity disorder (ADHD), unspecified ADHD type  # MDD, mild, recurrent without psychotic features # Generalized anxiety disorder # Panic disorder She reports significant worsening in anxiety in the context of self discontinuation of citalopram due to concern of fatigue and weight gain.  Will start venlafaxine to target depression and anxiety.  Discussed potential risk of headache.  Will continue Xanax as needed for anxiety.  She is amenable to take this medication only when necessary to avoid risk of dependence and oversedation.   # ADHD (Patient has history of ADHD since age 65. She reportedly underwent neuropsychological evaluation) Although she does have a good response to Vyvanse, she would like to hold this medication at this time given she is also  interested in medication for weight loss, which will be potentially prescribed by her PCP. Will hold the medication at this time.  Plan I have reviewed and updated plans as below 1. Start venlafaxine 37.5 mg daily for one week, then 75 mg daily  2. Discontinue Vyvanse  3. Continue Xanax 1 mg daily as needed for anxiety 4. Next appointment:7/6 at 8:50 for 20 mins, video - on hydrocodone - thyroid was checked, reportedly normal - sleep study in 2021- increased sleep latency, no OSA according to the patient  Past trials of medication:sertraline ("Zombie"), citalopram, lithium, Depakote, Abilify (visual impairment),xanax, ativan, clonazepam, adderall,Ritalin, Concerta,  The patient demonstrates the following risk factors for suicide: Chronic risk factors for suicide include:psychiatric disorder ofanxiety. Acute risk factorsfor suicide include: N/A. Protective factorsfor this patient include: positive social support, responsibility to others (children, family), coping skills and hope for the future. Considering these factors, the overall suicide  Norman Clay, MD 04/01/2020, 9:23 AM

## 2020-05-14 ENCOUNTER — Telehealth: Payer: Self-pay | Admitting: Nurse Practitioner

## 2020-05-14 NOTE — Telephone Encounter (Signed)
Patient would like a referral to derma logist for rash that not any better. She had video visit on 03/14/20

## 2020-05-15 ENCOUNTER — Encounter: Payer: Self-pay | Admitting: *Deleted

## 2020-05-15 NOTE — Telephone Encounter (Signed)
And lmtc.

## 2020-05-15 NOTE — Telephone Encounter (Signed)
Sent mychart message

## 2020-05-15 NOTE — Telephone Encounter (Signed)
Please get more information for the referral.  Fever? Taking Doxycycline? Does it work? Changes in the rash or location?   Thanks.

## 2020-05-20 ENCOUNTER — Other Ambulatory Visit: Payer: Self-pay

## 2020-05-20 ENCOUNTER — Telehealth (INDEPENDENT_AMBULATORY_CARE_PROVIDER_SITE_OTHER): Payer: Medicaid Other | Admitting: Psychiatry

## 2020-05-20 ENCOUNTER — Encounter (HOSPITAL_COMMUNITY): Payer: Self-pay | Admitting: Psychiatry

## 2020-05-20 DIAGNOSIS — F330 Major depressive disorder, recurrent, mild: Secondary | ICD-10-CM | POA: Diagnosis not present

## 2020-05-20 DIAGNOSIS — F411 Generalized anxiety disorder: Secondary | ICD-10-CM

## 2020-05-20 DIAGNOSIS — F410 Panic disorder [episodic paroxysmal anxiety] without agoraphobia: Secondary | ICD-10-CM | POA: Diagnosis not present

## 2020-05-20 MED ORDER — VENLAFAXINE HCL ER 150 MG PO CP24: 150.0000 mg | ORAL_CAPSULE | Freq: Every day | ORAL | 1 refills | Status: DC

## 2020-05-20 NOTE — Progress Notes (Signed)
Virtual Visit via Video Note  I connected with [name] on 05/20/20 at  8:50 AM EDT by a video enabled telemedicine application and verified that I am speaking with the correct person using two identifiers.   I discussed the limitations of evaluation and management by telemedicine and the availability of in person appointments. The patient expressed understanding and agreed to proceed.    I discussed the assessment and treatment plan with the patient. The patient was provided an opportunity to ask questions and all were answered. The patient agreed with the plan and demonstrated an understanding of the instructions.   The patient was advised to call back or seek an in-person evaluation if the symptoms worsen or if the condition fails to improve as anticipated.  Location: patient- home, provider- home office   I provided 15 minutes of non-face-to-face time during this encounter.   Neysa Hotter, MD    [facility] MD/PA/NP OP Progress Note  05/20/2020 9:37 AM MONCERRATH BERHE  MRN:  [id number]  Chief Complaint:  Chief Complaint    Depression; Follow-up     HPI:  This is a follow-up appointment for depression and anxiety.  She states that she has not noticed any side effects since switching from citalopram to Effexor.  Although she was having more depressed days during this change in medication, it has improved some since then.  Although she does work every day, she reports significant fatigue and depressed mood for a few days per week. She becomes isolative as she does not want to be around with people.  She has been able to take care of her son regardless of her mood.  He is struggling with ADHD, and there was some medication change.  She has occasional insomnia.  She feels exhausted especially when she has insomnia.  She has occasional difficulty in concentration.  She enjoys watching movie or sing with her son.  She denies SI.  She feels anxious and tense at times.  Although she denies  panic attacks, she has been getting more irritable.  She has fair appetite. She denies alcohol use or drug use. She takes xanax a few times per week.     Visit Diagnosis:    ICD-10-CM   1. MDD (major depressive disorder), recurrent episode, mild (HCC)  F33.0   2. GAD (generalized anxiety disorder)  F41.1   3. Panic disorder  F41.0     Past Psychiatric History: Please see initial evaluation for full details. I have reviewed the history. No updates at this time.     Past Medical History:  Past Medical History:  Diagnosis Date  . Anxiety   . Asthma   . Heart murmur   . Hypothyroidism   . IBS (irritable bowel syndrome)   . Interstitial cystitis     Past Surgical History:  Procedure Laterality Date  . APPENDECTOMY    . CESAREAN SECTION N/A 03/20/2013   Procedure: CESAREAN SECTION;  Surgeon: [name], MD;  Location: [facility];  Service: Obstetrics;  Laterality: N/A;  . CESAREAN SECTION    . LAPAROSCOPIC APPENDECTOMY N/A 10/16/2019   Procedure: APPENDECTOMY LAPAROSCOPIC;  Surgeon: Franky Macho, MD;  Location: [facility];  Service: General;  Laterality: N/A;  . MYRINGOTOMY WITH TUBE PLACEMENT    . NO PAST SURGERIES    . TYMPANOSTOMY TUBE PLACEMENT    . UPPER GI ENDOSCOPY      Family Psychiatric History: Please see initial evaluation for full details. I have reviewed the history. No  updates at this time.     Family History:  Family History  Problem Relation Age of Onset  . Cancer Paternal Uncle   . COPD Maternal Grandfather   . Stroke Paternal Grandfather   . Depression Mother   . OCD Mother   . Bipolar disorder Paternal Aunt   . Drug abuse Maternal Uncle   . Depression Maternal Grandmother   . Anxiety disorder Maternal Grandmother     Social History:  Social History   Socioeconomic History  . Marital status: Single    Spouse name: Not on file  . Number of children: Not on file  . Years of education: Not on file  . Highest education level: Not on file   Occupational History  . Not on file  Tobacco Use  . Smoking status: Former Smoker    Packs/day: 0.50    Types: Cigarettes    Start date: 01/28/2013  . Smokeless tobacco: Never Used  Vaping Use  . Vaping Use: Former  Substance and Sexual Activity  . Alcohol use: No    Alcohol/week: 0.0 standard drinks  . Drug use: No  . Sexual activity: Yes    Partners: Male    Birth control/protection: None  Other Topics Concern  . Not on file  Social History Narrative   ** Merged History Encounter **       Social Determinants of Health   Financial Resource Strain:   . Difficulty of Paying Living Expenses:   Food Insecurity:   . Worried About Programme researcher, broadcasting/film/videounning Out of Food in the Last Year:   . Baristaan Out of Food in the Last Year:   Transportation Needs:   . Freight forwarderLack of Transportation (Medical):   Marland Kitchen. Lack of Transportation (Non-Medical):   Physical Activity:   . Days of Exercise per Week:   . Minutes of Exercise per Session:   Stress:   . Feeling of Stress :   Social Connections:   . Frequency of Communication with Friends and Family:   . Frequency of Social Gatherings with Friends and Family:   . Attends Religious Services:   . Active Member of Clubs or Organizations:   . Attends BankerClub or Organization Meetings:   Marland Kitchen. Marital Status:     Allergies:  Allergies  Allergen Reactions  . Ciprofloxacin Nausea Only  . Sulfa Antibiotics Diarrhea and Nausea And Vomiting  . Buspar [Buspirone] Anxiety  . Levaquin [Levofloxacin] Anxiety    Metabolic Disorder Labs: Lab Results  Component Value Date   HGBA1C 5.0 11/24/2016   No results found for: PROLACTIN No results found for: CHOL, TRIG, HDL, CHOLHDL, VLDL, LDLCALC Lab Results  Component Value Date   TSH 28.86 (H) 07/26/2018   TSH 4.370 11/24/2016    Therapeutic Level Labs: No results found for: LITHIUM No results found for: VALPROATE No components found for:  CBMZ  Current Medications: Current Outpatient Medications  Medication Sig Dispense  Refill  . albuterol (VENTOLIN HFA) 108 (90 Base) MCG/ACT inhaler Inhale 2 puffs into the lungs every 4 (four) hours as needed for wheezing or shortness of breath. 1 Inhaler 1  . ALPRAZolam (XANAX) 1 MG tablet 0.5-1 mg daily as needed for anxiety 30 tablet 2  . doxycycline (VIBRA-TABS) 100 MG tablet Take 1 tablet (100 mg total) by mouth 2 (two) times daily. Prn skin infection; do not take if pregnant 60 tablet 0  . fexofenadine (ALLEGRA) 180 MG tablet Take 180 mg by mouth daily as needed for allergies.     .Marland Kitchen  fluticasone (FLONASE) 50 MCG/ACT nasal spray Place 2 sprays into both nostrils daily. 16 g 0  . levothyroxine (SYNTHROID) 125 MCG tablet Take 1 tablet by mouth daily.    Marland Kitchen venlafaxine XR (EFFEXOR-XR) 150 MG 24 hr capsule Take 1 capsule (150 mg total) by mouth daily with breakfast. 30 capsule 1   No current facility-administered medications for this visit.     Musculoskeletal: Strength & Muscle Tone: N/A Gait & Station: N/A  Patient leans: N/A  Psychiatric Specialty Exam: Review of Systems  Psychiatric/Behavioral: Positive for decreased concentration, dysphoric mood and sleep disturbance. Negative for agitation, behavioral problems, confusion, hallucinations, self-injury and suicidal ideas. The patient is nervous/anxious. The patient is not hyperactive.   All other systems reviewed and are negative.   There were no vitals taken for this visit.There is no height or weight on file to calculate BMI.  General Appearance: Fairly Groomed  Eye Contact:  Good  Speech:  Clear and Coherent  Volume:  Normal  Mood:  fatigue  Affect:  Appropriate, Congruent, Restricted and fatigue  Thought Process:  Coherent and Goal Directed  Orientation:  Full (Time, Place, and Person)  Thought Content: Logical   Suicidal Thoughts:  No  Homicidal Thoughts:  No  Memory:  Immediate;   Good  Judgement:  Good  Insight:  Good  Psychomotor Activity:  Normal  Concentration:  Concentration: Good and Attention  Span: Good  Recall:  Good  Fund of Knowledge: Good  Language: Good  Akathisia:  No  Handed:  Right  AIMS (if indicated): not done  Assets:  Communication Skills Desire for Improvement  ADL's:  Intact  Cognition: WNL  Sleep:  Poor   Screenings: GAD-7     Office Visit from 03/07/2018 in [facility] Office Visit from 11/22/2017 in [facility]  Total GAD-7 Score 14 15    PHQ2-9     Office Visit from 03/07/2018 in [facility]  PHQ-2 Total Score 5  PHQ-9 Total Score 18       Assessment and Plan:  DAIJAH SCRIVENS is a 29 y.o. year old female with a history of depression, anxiety, ADHD, who presents for follow up appointment for below.    1. MDD (major depressive disorder), recurrent episode, mild (HCC) 2. GAD (generalized anxiety disorder) 3. Panic disorder She continues to report depressive symptoms and irritability since the last visit.  She denies any weight gain since switching from citalopram to venlafaxine.  We will do further up titration of venlafaxine to optimize treatment for depression and anxiety.  Will continue Xanax as needed for anxiety.  She is amenable to take this medication only when necessary to avoid risk of dependence and oversedation.    #ADHD (Patient has history of ADHD since age 76. She reportedly underwent neuropsychological evaluation) Although she does have a good response to Vyvanse, she would like to hold this medication at this time given she is also interested in medication for weight loss, which will be potentially prescribed by her PCP. Will hold the medication at this time.    Plan 1. Increase venlafaxine 150 mg daily 2. Continue Xanax 1 mg daily as needed for anxiety- 2 refills left 3. Next appointment:8/3 at 8:30 for 20 mins, video - on hydrocodone - thyroid was checked, reportedly normal - sleep study in 2021- increased sleep latency, no OSA according to the patient  Past trials of  medication:sertraline ("Zombie"), citalopram, lithium, Depakote, Abilify (visual impairment),xanax, ativan, clonazepam, adderall,Ritalin, Concerta,  The patient  demonstrates the following risk factors for suicide: Chronic risk factors for suicide include:psychiatric disorder ofanxiety. Acute risk factorsfor suicide include: N/A. Protective factorsfor this patient include: positive social support, responsibility to others (children, family), coping skills and hope for the future. Considering these factors, the overall suicide Neysa Hotter, MD 05/20/2020, 9:37 AM

## 2020-06-05 ENCOUNTER — Encounter: Payer: Self-pay | Admitting: Nurse Practitioner

## 2020-06-05 ENCOUNTER — Other Ambulatory Visit: Payer: Self-pay

## 2020-06-05 ENCOUNTER — Ambulatory Visit (INDEPENDENT_AMBULATORY_CARE_PROVIDER_SITE_OTHER): Payer: Medicaid Other | Admitting: Nurse Practitioner

## 2020-06-05 VITALS — BP 124/72 | Temp 98.2°F | Wt 284.0 lb

## 2020-06-05 DIAGNOSIS — B9689 Other specified bacterial agents as the cause of diseases classified elsewhere: Secondary | ICD-10-CM

## 2020-06-05 DIAGNOSIS — R7301 Impaired fasting glucose: Secondary | ICD-10-CM | POA: Diagnosis not present

## 2020-06-05 DIAGNOSIS — L089 Local infection of the skin and subcutaneous tissue, unspecified: Secondary | ICD-10-CM | POA: Diagnosis not present

## 2020-06-05 DIAGNOSIS — R350 Frequency of micturition: Secondary | ICD-10-CM | POA: Diagnosis not present

## 2020-06-05 LAB — POCT GLYCOSYLATED HEMOGLOBIN (HGB A1C): Hemoglobin A1C: 5.4 % (ref 4.0–5.6)

## 2020-06-05 LAB — POCT URINALYSIS DIPSTICK (MANUAL)
Nitrite, UA: NEGATIVE
Poct Bilirubin: NEGATIVE
Poct Blood: NEGATIVE
Poct Glucose: NORMAL mg/dL
Poct Ketones: NEGATIVE
Poct Protein: NEGATIVE mg/dL
Poct Urobilinogen: NORMAL mg/dL
Spec Grav, UA: 1.025 (ref 1.010–1.025)
pH, UA: 7 (ref 5.0–8.0)

## 2020-06-05 MED ORDER — DOXYCYCLINE HYCLATE 100 MG PO TABS: 100.0000 mg | ORAL_TABLET | Freq: Two times a day (BID) | ORAL | 0 refills | Status: DC

## 2020-06-05 MED ORDER — FLUCONAZOLE 150 MG PO TABS: ORAL_TABLET | ORAL | 0 refills | Status: DC

## 2020-06-05 NOTE — Progress Notes (Signed)
   Subjective:    Patient ID: Linda [name], female    DOB: 1990-11-24, 29 y.o.   MRN: [id number]  HPI Presents for complaints of a sore area on the left upper inner thigh for the past 3 weeks.  Started as a hard nodule, since then has gone flat with some loose skin.  No drainage.  Very tender today.  No fever.  Has frequent outbreaks along the upper thigh area.  Patient also complains of frequent urination.  Minimal dysuria.  Minimal urgency.  No vaginal discharge.  No new sexual partners.  No concerns about STDs.  No flank pain.  No pelvic pain.   Review of Systems     Objective:   Physical Exam NAD.  Alert, oriented.  Lungs clear.  Heart regular rate and rhythm.  An small area of loose skin noted from a previous lesion/cyst in the left upper inner thigh.  Slightly pink in color.  No surrounding erythema.  Tender to palpation but no mass or drainage noted. Urine micro occasional epithelial, rare WBC otherwise clear. Results for orders placed or performed in visit on 06/05/20  POCT Urinalysis Dip Manual  Result Value Ref Range   Spec Grav, UA 1.025 1.010 - 1.025   pH, UA 7.0 5.0 - 8.0   Leukocytes, UA Moderate (2+) (A) Negative   Nitrite, UA Negative Negative   Poct Protein Negative Negative, trace mg/dL   Poct Glucose Normal Normal mg/dL   Poct Ketones Negative Negative   Poct Urobilinogen Normal Normal mg/dL   Poct Bilirubin Negative Negative   Poct Blood Negative Negative, trace  POCT glycosylated hemoglobin (Hb A1C)  Result Value Ref Range   Hemoglobin A1C 5.4 4.0 - 5.6 %   HbA1c POC (<> result, manual entry)     HbA1c, POC (prediabetic range)     HbA1c, POC (controlled diabetic range)     Today's Vitals   06/05/20 1408  BP: 124/72  Temp: 98.2 F (36.8 C)  Weight: (!) 284 lb (128.8 kg)   Body mass index is 47.26 kg/m.      Assessment & Plan:  Frequency of urination - Plan: POCT Urinalysis Dip Manual, Urine Culture  Elevated fasting blood sugar - Plan: POCT  glycosylated hemoglobin (Hb A1C), CANCELED: Hemoglobin A1c  Bacterial skin infection  Meds ordered this encounter  Medications  . doxycycline (VIBRA-TABS) 100 MG tablet    Sig: Take 1 tablet (100 mg total) by mouth 2 (two) times daily. Prn skin infection; do not take if pregnant    Dispense:  60 tablet    Refill:  0    Order Specific Question:   Supervising Provider    Answer:   Lilyan Punt A [[id number]]  . fluconazole (DIFLUCAN) 150 MG tablet    Sig: One po qd prn yeast infection; may repeat in 3-4 days if needed    Dispense:  2 tablet    Refill:  0    Order Specific Question:   Supervising Provider    Answer:   Lilyan Punt A [[id number]]   Call back next week if tenderness has not resolved.  Sooner if area worsens. Urine sent for culture.  Warning signs reviewed.  Patient to call back in the meantime if urinary symptoms worsen. Encouraged regular activity healthy diet and weight loss.

## 2020-06-06 ENCOUNTER — Encounter: Payer: Self-pay | Admitting: Nurse Practitioner

## 2020-06-07 LAB — URINE CULTURE

## 2020-06-07 LAB — SPECIMEN STATUS REPORT

## 2020-06-11 NOTE — Progress Notes (Deleted)
[facility] MD/PA/NP OP Progress Note  06/11/2020 2:31 PM Linda [name]  MRN:  [id number]  Chief Complaint:  HPI: *** Visit Diagnosis: No diagnosis found.  Past Psychiatric History: Please see initial evaluation for full details. I have reviewed the history. No updates at this time.     Past Medical History:  Past Medical History:  Diagnosis Date  . Anxiety   . Asthma   . Heart murmur   . Hypothyroidism   . IBS (irritable bowel syndrome)   . Interstitial cystitis     Past Surgical History:  Procedure Laterality Date  . APPENDECTOMY    . CESAREAN SECTION N/A 03/20/2013   Procedure: CESAREAN SECTION;  Surgeon: [name], MD;  Location: [facility];  Service: Obstetrics;  Laterality: N/A;  . CESAREAN SECTION    . LAPAROSCOPIC APPENDECTOMY N/A 10/16/2019   Procedure: APPENDECTOMY LAPAROSCOPIC;  Surgeon: Franky Macho, MD;  Location: [facility];  Service: General;  Laterality: N/A;  . MYRINGOTOMY WITH TUBE PLACEMENT    . NO PAST SURGERIES    . TYMPANOSTOMY TUBE PLACEMENT    . UPPER GI ENDOSCOPY      Family Psychiatric History: Please see initial evaluation for full details. I have reviewed the history. No updates at this time.     Family History:  Family History  Problem Relation Age of Onset  . Cancer Paternal Uncle   . COPD Maternal Grandfather   . Stroke Paternal Grandfather   . Depression Mother   . OCD Mother   . Bipolar disorder Paternal Aunt   . Drug abuse Maternal Uncle   . Depression Maternal Grandmother   . Anxiety disorder Maternal Grandmother     Social History:  Social History   Socioeconomic History  . Marital status: Single    Spouse name: Not on file  . Number of children: Not on file  . Years of education: Not on file  . Highest education level: Not on file  Occupational History  . Not on file  Tobacco Use  . Smoking status: Former Smoker    Packs/day: 0.50    Types: Cigarettes    Start date: 01/28/2013  . Smokeless tobacco: Never Used  Vaping  Use  . Vaping Use: Former  Substance and Sexual Activity  . Alcohol use: No    Alcohol/week: 0.0 standard drinks  . Drug use: No  . Sexual activity: Yes    Partners: Male    Birth control/protection: None  Other Topics Concern  . Not on file  Social History Narrative   ** Merged History Encounter **       Social Determinants of Health   Financial Resource Strain:   . Difficulty of Paying Living Expenses:   Food Insecurity:   . Worried About Programme researcher, broadcasting/film/video in the Last Year:   . Barista in the Last Year:   Transportation Needs:   . Freight forwarder (Medical):   Marland Kitchen Lack of Transportation (Non-Medical):   Physical Activity:   . Days of Exercise per Week:   . Minutes of Exercise per Session:   Stress:   . Feeling of Stress :   Social Connections:   . Frequency of Communication with Friends and Family:   . Frequency of Social Gatherings with Friends and Family:   . Attends Religious Services:   . Active Member of Clubs or Organizations:   . Attends Banker Meetings:   Marland Kitchen Marital Status:     Allergies:  Allergies  Allergen Reactions  . Ciprofloxacin Nausea Only  . Sulfa Antibiotics Diarrhea and Nausea And Vomiting  . Buspar [Buspirone] Anxiety  . Levaquin [Levofloxacin] Anxiety    Metabolic Disorder Labs: Lab Results  Component Value Date   HGBA1C 5.4 06/05/2020   No results found for: PROLACTIN No results found for: CHOL, TRIG, HDL, CHOLHDL, VLDL, LDLCALC Lab Results  Component Value Date   TSH 28.86 (H) 07/26/2018   TSH 4.370 11/24/2016    Therapeutic Level Labs: No results found for: LITHIUM No results found for: VALPROATE No components found for:  CBMZ  Current Medications: Current Outpatient Medications  Medication Sig Dispense Refill  . albuterol (VENTOLIN HFA) 108 (90 Base) MCG/ACT inhaler Inhale 2 puffs into the lungs every 4 (four) hours as needed for wheezing or shortness of breath. 1 Inhaler 1  . ALPRAZolam  (XANAX) 1 MG tablet 0.5-1 mg daily as needed for anxiety 30 tablet 2  . doxycycline (VIBRA-TABS) 100 MG tablet Take 1 tablet (100 mg total) by mouth 2 (two) times daily. Prn skin infection; do not take if pregnant 60 tablet 0  . fexofenadine (ALLEGRA) 180 MG tablet Take 180 mg by mouth daily as needed for allergies.     . fluconazole (DIFLUCAN) 150 MG tablet One po qd prn yeast infection; may repeat in 3-4 days if needed 2 tablet 0  . levothyroxine (SYNTHROID) 125 MCG tablet Take 1 tablet by mouth daily.    Marland Kitchen venlafaxine XR (EFFEXOR-XR) 150 MG 24 hr capsule Take 1 capsule (150 mg total) by mouth daily with breakfast. 30 capsule 1   No current facility-administered medications for this visit.     Musculoskeletal: Strength & Muscle Tone: N/A Gait & Station: N/A Patient leans: N/A  Psychiatric Specialty Exam: Review of Systems  There were no vitals taken for this visit.There is no height or weight on file to calculate BMI.  General Appearance: {Appearance:22683}  Eye Contact:  {BHH EYE CONTACT:22684}  Speech:  Clear and Coherent  Volume:  Normal  Mood:  {BHH MOOD:22306}  Affect:  {Affect (PAA):22687}  Thought Process:  Coherent  Orientation:  Full (Time, Place, and Person)  Thought Content: Logical   Suicidal Thoughts:  {ST/HT (PAA):22692}  Homicidal Thoughts:  {ST/HT (PAA):22692}  Memory:  Immediate;   Good  Judgement:  {Judgement (PAA):22694}  Insight:  {Insight (PAA):22695}  Psychomotor Activity:  Normal  Concentration:  Concentration: Good and Attention Span: Good  Recall:  Good  Fund of Knowledge: Good  Language: Good  Akathisia:  No  Handed:  Right  AIMS (if indicated): not done  Assets:  Communication Skills Desire for Improvement  ADL's:  Intact  Cognition: WNL  Sleep:  {BHH GOOD/FAIR/POOR:22877}   Screenings: GAD-7     Office Visit from 03/07/2018 in [facility] Office Visit from 11/22/2017 in [facility]  Total GAD-7 Score 14 15     PHQ2-9     Office Visit from 03/07/2018 in [facility]  PHQ-2 Total Score 5  PHQ-9 Total Score 18       Assessment and Plan:  Linda [name] is a 29 y.o. year old female with a history of depression, anxiety, ADHD, who presents for follow up appointment for below.   1. MDD (major depressive disorder), recurrent episode, mild (HCC) 2. GAD (generalized anxiety disorder) 3. Panic disorder She continues to report depressive symptoms and irritability since the last visit.  She denies any weight gain since switching from citalopram to venlafaxine.  We  will do further up titration of venlafaxine to optimize treatment for depression and anxiety.  Will continue Xanax as needed for anxiety.  She is amenable to take this medication only when necessary to avoid risk of dependence and oversedation.    #ADHD(Patient has history of ADHD since age 2. She reportedly underwent neuropsychological evaluation) Although she does have a good response to Vyvanse,she would like to hold this medication at this time given she is also interested in medication for weight loss, which will be potentially prescribed by her PCP. Will hold the medication at this time.   Plan 1. Increase venlafaxine 150 mg daily 2. Continue Xanax 1 mg daily as needed for anxiety- 2 refills left 3. Next appointment:8/3 at 8:30 for 20 mins, video - on hydrocodone - thyroid was checked, reportedly normal -sleep study in 2021- increased sleep latency, noOSA according to the patient  Past trials of medication:sertraline ("Zombie"),citalopram, lithium, Depakote, Abilify (visual impairment),xanax, ativan, clonazepam, adderall,Ritalin, Concerta,  The patient demonstrates the following risk factors for suicide: Chronic risk factors for suicide include:psychiatric disorder ofanxiety. Acute risk factorsfor suicide include: N/A. Protective factorsfor this patient include: positive social support,  responsibility to others (children, family), coping skills and hope for the future. Considering these factors, the overall suicide  Neysa Hotter, MD 06/11/2020, 2:31 PM

## 2020-06-16 ENCOUNTER — Encounter: Payer: Self-pay | Admitting: Nurse Practitioner

## 2020-06-17 ENCOUNTER — Telehealth (HOSPITAL_COMMUNITY): Payer: Medicaid Other | Admitting: Psychiatry

## 2020-06-17 ENCOUNTER — Other Ambulatory Visit: Payer: Self-pay

## 2020-06-17 ENCOUNTER — Telehealth (HOSPITAL_COMMUNITY): Payer: Self-pay | Admitting: Psychiatry

## 2020-06-17 NOTE — Telephone Encounter (Signed)
Late entry. Sent link for video visit through Epic. Patient did not sign in. Called the patient for appointment scheduled today. The patient did not answer the phone. No option to leave a voice message.

## 2020-07-02 NOTE — Progress Notes (Signed)
Virtual Visit via Video Note  I connected with Linda [name] on 07/08/20 at 12:00 PM EDT by a video enabled telemedicine application and verified that I am speaking with the correct [name] using two identifiers.   I discussed the limitations of evaluation and management by telemedicine and the availability of in [name] appointments. The patient expressed understanding and agreed to proceed.     I discussed the assessment and treatment plan with the patient. The patient was provided an opportunity to ask questions and all were answered. The patient agreed with the plan and demonstrated an understanding of the instructions.   The patient was advised to call back or seek an in-[name] evaluation if the symptoms worsen or if the condition fails to improve as anticipated.  Location: patient- home, provider- home office   I provided 12 minutes of non-face-to-face time during this encounter.   Neysa Hotter, MD    [facility] MD/PA/NP OP Progress Note  07/08/2020 12:22 PM Linda [name]  MRN:  [id number]  Chief Complaint:  Chief Complaint    Depression; Anxiety; Follow-up     HPI:  This is a follow-up appointment for depression, anxiety.  She states that she is not as irritable compared to before,  and has been relatively doing well since up titration of venlafaxine.  Although she initially had nausea, it has been improving.  She works 4 days/week, working from home.  Her work is going well.  Her son will start school soon.  He is doing well, and she reports good relationship with him.  She has occasional insomnia.  She denies feeling depressed. She denies anhedonia.  She has fair motivation and energy.  She has good concentration at work.  She denies SI.  She feels less anxious.  She had a panic attack when she went to [place].  She has gained weight and has increase in appetite since up titration of venlafaxine.  She is willing to work on diet.  She takes a walk with her dog; she agrees to do  exercise more regularly. She takes xanax a few times per week for anxiety. She denies alcohol use or drug use.   Wt Readings from Last 3 Encounters:  06/05/20 (!) 284 lb (128.8 kg)  12/30/19 280 lb (127 kg)  12/26/19 279 lb 9.6 oz (126.8 kg)   Visit Diagnosis:    ICD-10-CM   1. MDD (major depressive disorder), recurrent, in partial remission (HCC)  F33.41   2. GAD (generalized anxiety disorder)  F41.1   3. Panic disorder  F41.0     Past Psychiatric History: Please see initial evaluation for full details. I have reviewed the history. No updates at this time.     Past Medical History:  Past Medical History:  Diagnosis Date  . Anxiety   . Asthma   . Heart murmur   . Hypothyroidism   . IBS (irritable bowel syndrome)   . Interstitial cystitis     Past Surgical History:  Procedure Laterality Date  . APPENDECTOMY    . CESAREAN SECTION N/A 03/20/2013   Procedure: CESAREAN SECTION;  Surgeon: [name], MD;  Location: [facility];  Service: Obstetrics;  Laterality: N/A;  . CESAREAN SECTION    . LAPAROSCOPIC APPENDECTOMY N/A 10/16/2019   Procedure: APPENDECTOMY LAPAROSCOPIC;  Surgeon: Franky Macho, MD;  Location: [facility];  Service: General;  Laterality: N/A;  . MYRINGOTOMY WITH TUBE PLACEMENT    . NO PAST SURGERIES    . TYMPANOSTOMY TUBE PLACEMENT    .  UPPER GI ENDOSCOPY      Family Psychiatric History: Please see initial evaluation for full details. I have reviewed the history. No updates at this time.     Family History:  Family History  Problem Relation Age of Onset  . Cancer Paternal Uncle   . COPD Maternal Grandfather   . Stroke Paternal Grandfather   . Depression Mother   . OCD Mother   . Bipolar disorder Paternal Aunt   . Drug abuse Maternal Uncle   . Depression Maternal Grandmother   . Anxiety disorder Maternal Grandmother     Social History:  Social History   Socioeconomic History  . Marital status: Single    Spouse name: Not on file  . Number of  children: Not on file  . Years of education: Not on file  . Highest education level: Not on file  Occupational History  . Not on file  Tobacco Use  . Smoking status: Former Smoker    Packs/day: 0.50    Types: Cigarettes    Start date: 01/28/2013  . Smokeless tobacco: Never Used  Vaping Use  . Vaping Use: Former  Substance and Sexual Activity  . Alcohol use: No    Alcohol/week: 0.0 standard drinks  . Drug use: No  . Sexual activity: Yes    Partners: Male    Birth control/protection: None  Other Topics Concern  . Not on file  Social History Narrative   ** Merged History Encounter **       Social Determinants of Health   Financial Resource Strain:   . Difficulty of Paying Living Expenses: Not on file  Food Insecurity:   . Worried About Programme researcher, broadcasting/film/videounning Out of Food in the Last Year: Not on file  . Ran Out of Food in the Last Year: Not on file  Transportation Needs:   . Lack of Transportation (Medical): Not on file  . Lack of Transportation (Non-Medical): Not on file  Physical Activity:   . Days of Exercise per Week: Not on file  . Minutes of Exercise per Session: Not on file  Stress:   . Feeling of Stress : Not on file  Social Connections:   . Frequency of Communication with Friends and Family: Not on file  . Frequency of Social Gatherings with Friends and Family: Not on file  . Attends Religious Services: Not on file  . Active Member of Clubs or Organizations: Not on file  . Attends BankerClub or Organization Meetings: Not on file  . Marital Status: Not on file    Allergies:  Allergies  Allergen Reactions  . Ciprofloxacin Nausea Only  . Sulfa Antibiotics Diarrhea and Nausea And Vomiting  . Buspar [Buspirone] Anxiety  . Levaquin [Levofloxacin] Anxiety    Metabolic Disorder Labs: Lab Results  Component Value Date   HGBA1C 5.4 06/05/2020   No results found for: PROLACTIN No results found for: CHOL, TRIG, HDL, CHOLHDL, VLDL, LDLCALC Lab Results  Component Value Date   TSH  28.86 (H) 07/26/2018   TSH 4.370 11/24/2016    Therapeutic Level Labs: No results found for: LITHIUM No results found for: VALPROATE No components found for:  CBMZ  Current Medications: Current Outpatient Medications  Medication Sig Dispense Refill  . albuterol (VENTOLIN HFA) 108 (90 Base) MCG/ACT inhaler Inhale 2 puffs into the lungs every 4 (four) hours as needed for wheezing or shortness of breath. 1 Inhaler 1  . ALPRAZolam (XANAX) 1 MG tablet 0.5-1 mg daily as needed for anxiety 30 tablet 2  .  doxycycline (VIBRA-TABS) 100 MG tablet Take 1 tablet (100 mg total) by mouth 2 (two) times daily. Prn skin infection; do not take if pregnant 60 tablet 0  . fexofenadine (ALLEGRA) 180 MG tablet Take 180 mg by mouth daily as needed for allergies.     . fluconazole (DIFLUCAN) 150 MG tablet One po qd prn yeast infection; may repeat in 3-4 days if needed 2 tablet 0  . levothyroxine (SYNTHROID) 125 MCG tablet Take 1 tablet by mouth daily.    Melene Muller ON 07/19/2020] venlafaxine XR (EFFEXOR-XR) 150 MG 24 hr capsule Take 1 capsule (150 mg total) by mouth daily with breakfast. 30 capsule 1   No current facility-administered medications for this visit.     Musculoskeletal: Strength & Muscle Tone: N/A Gait & Station: N/A Patient leans: N/A  Psychiatric Specialty Exam: Review of Systems  Psychiatric/Behavioral: Positive for sleep disturbance. Negative for agitation, behavioral problems, confusion, decreased concentration, dysphoric mood, hallucinations, self-injury and suicidal ideas. The patient is nervous/anxious. The patient is not hyperactive.   All other systems reviewed and are negative.   There were no vitals taken for this visit.There is no height or weight on file to calculate BMI.  General Appearance: Fairly Groomed  Eye Contact:  Good  Speech:  Clear and Coherent  Volume:  Normal  Mood:  good  Affect:  Appropriate, Congruent and euthymic  Thought Process:  Coherent  Orientation:  Full  (Time, Place, and [name])  Thought Content: Logical   Suicidal Thoughts:  No  Homicidal Thoughts:  No  Memory:  Immediate;   Good  Judgement:  Good  Insight:  Fair  Psychomotor Activity:  Normal  Concentration:  Concentration: Good and Attention Span: Good  Recall:  Good  Fund of Knowledge: Good  Language: Good  Akathisia:  No  Handed:  Right  AIMS (if indicated): not done  Assets:  Communication Skills Desire for Improvement  ADL's:  Intact  Cognition: WNL  Sleep:  Fair   Screenings: GAD-7     Office Visit from 03/07/2018 in [facility] Office Visit from 11/22/2017 in [facility]  Total GAD-7 Score 14 15    PHQ2-9     Office Visit from 03/07/2018 in [facility]  PHQ-2 Total Score 5  PHQ-9 Total Score 18       Assessment and Plan:  HUYEN PERAZZO is a 29 y.o. year old female with a history of depression, anxiety, ADHD, who presents for follow up appointment for below.   1. MDD (major depressive disorder), recurrent, in partial remission (HCC) 2. GAD (generalized anxiety disorder) 3. Panic disorder She reports overall improvement in her mood symptoms since up titration of venlafaxine.  Although she has had weight gain, she is willing to work on diet and exercise before switching to other medication.  Will continue current dose of venlafaxine to target depression and anxiety.  Will continue Xanax as needed for anxiety.  Discussed risk of dependence and oversedation.    #ADHD(Patient has history of ADHD since age 35. She reportedly underwent neuropsychological evaluation) She reports good concentration on today's evaluation.  We will continue to monitor as needed.   Plan 1. Continue venlafaxine 150 mg daily- monitor weight gain 2. Continue Xanax 1 mg daily as needed for anxiety 3. Next appointment: 11/4 at 1:20 for 20 mins, video - on hydrocodone - thyroid was checked, reportedly normal -sleep study in 2021- increased  sleep latency, noOSA according to the patient  Past trials of  medication:sertraline ("Zombie"),citalopram, lithium, Depakote, Abilify (visual impairment),xanax, ativan, clonazepam, adderall,Ritalin, Concerta,  The patient demonstrates the following risk factors for suicide: Chronic risk factors for suicide include:psychiatric disorder ofanxiety. Acute risk factorsfor suicide include: N/A. Protective factorsfor this patient include: positive social support, responsibility to others (children, family), coping skills and hope for the future. Considering these factors, the overall suicide  Neysa Hotter, MD 07/08/2020, 12:22 PM

## 2020-07-08 ENCOUNTER — Telehealth (INDEPENDENT_AMBULATORY_CARE_PROVIDER_SITE_OTHER): Payer: Medicaid Other | Admitting: Psychiatry

## 2020-07-08 ENCOUNTER — Encounter (HOSPITAL_COMMUNITY): Payer: Self-pay | Admitting: Psychiatry

## 2020-07-08 ENCOUNTER — Other Ambulatory Visit: Payer: Self-pay

## 2020-07-08 DIAGNOSIS — F411 Generalized anxiety disorder: Secondary | ICD-10-CM

## 2020-07-08 DIAGNOSIS — F3341 Major depressive disorder, recurrent, in partial remission: Secondary | ICD-10-CM

## 2020-07-08 DIAGNOSIS — F410 Panic disorder [episodic paroxysmal anxiety] without agoraphobia: Secondary | ICD-10-CM

## 2020-07-08 MED ORDER — VENLAFAXINE HCL ER 150 MG PO CP24: 150.0000 mg | ORAL_CAPSULE | Freq: Every day | ORAL | 1 refills | Status: DC

## 2020-07-08 MED ORDER — ALPRAZOLAM 1 MG PO TABS: ORAL_TABLET | ORAL | 2 refills | Status: DC

## 2020-07-08 NOTE — Patient Instructions (Signed)
1. Continue venlafaxine 150 mg daily 2. Continue Xanax 1 mg daily as needed for anxiety 3. Next appointment: 11/4 at 1:20

## 2020-07-10 ENCOUNTER — Ambulatory Visit (INDEPENDENT_AMBULATORY_CARE_PROVIDER_SITE_OTHER): Payer: Medicaid Other | Admitting: Family Medicine

## 2020-07-10 ENCOUNTER — Encounter: Payer: Self-pay | Admitting: Family Medicine

## 2020-07-10 ENCOUNTER — Other Ambulatory Visit: Payer: Self-pay

## 2020-07-10 VITALS — BP 122/82 | HR 98 | Temp 97.4°F | Ht 65.0 in | Wt 283.2 lb

## 2020-07-10 DIAGNOSIS — L732 Hidradenitis suppurativa: Secondary | ICD-10-CM

## 2020-07-10 MED ORDER — CLINDAMYCIN PHOSPHATE 1 % EX SOLN: Freq: Two times a day (BID) | CUTANEOUS | 0 refills | Status: DC

## 2020-07-10 NOTE — Patient Instructions (Signed)
Apply Clindamycin solution to areas twice per day. We will make a derm referral  Follow-up with your GYN for cyst removal.   Hidradenitis Suppurativa  Hidradenitis suppurativa is a long-term (chronic) skin disease. It is similar to a severe form of acne, but it affects areas of the body where acne would be unusual, especially areas of the body where skin rubs against skin and becomes moist. These include:  Underarms.  Groin.  Genital area.  Buttocks.  Upper thighs.  Breasts. Hidradenitis suppurativa may start out as small lumps or pimples caused by blocked sweat glands or hair follicles. Pimples may develop into deep sores that break open (rupture) and drain pus. Over time, affected areas of skin may thicken and become scarred. This condition is rare and does not spread from person to person (non-contagious). What are the causes? The exact cause of this condition is not known. It may be related to:  Female and female hormones.  An overactive disease-fighting system (immune system). The immune system may over-react to blocked hair follicles or sweat glands and cause swelling and pus-filled sores. What increases the risk? You are more likely to develop this condition if you:  Are female.  Are 57-69 years old.  Have a family history of hidradenitis suppurativa.  Have a personal history of acne.  Are overweight.  Smoke.  Take the medicine lithium. What are the signs or symptoms? The first symptoms are usually painful bumps in the skin, similar to pimples. The condition may get worse over time (progress), or it may only cause mild symptoms. If the disease progresses, symptoms may include:  Skin bumps getting bigger and growing deeper into the skin.  Bumps rupturing and draining pus.  Itchy, infected skin.  Skin getting thicker and scarred.  Tunnels under the skin (fistulas) where pus drains from a bump.  Pain during daily activities, such as pain during walking if your  groin area is affected.  Emotional problems, such as stress or depression. This condition may affect your appearance and your ability or willingness to wear certain clothes or do certain activities. How is this diagnosed? This condition is diagnosed by a health care provider who specializes in skin diseases (dermatologist). You may be diagnosed based on:  Your symptoms and medical history.  A physical exam.  Testing a pus sample for infection.  Blood tests. How is this treated? Your treatment will depend on how severe your symptoms are. The same treatment will not work for everybody with this condition. You may need to try several treatments to find what works best for you. Treatment may include:  Cleaning and bandaging (dressing) your wounds as needed.  Lifestyle changes, such as new skin care routines.  Taking medicines, such as: ? Antibiotics. ? Acne medicines. ? Medicines to reduce the activity of the immune system. ? A diabetes medicine (metformin). ? Birth control pills, for women. ? Steroids to reduce swelling and pain.  Working with a mental health care provider, if you experience emotional distress due to this condition. If you have severe symptoms that do not get better with medicine, you may need surgery. Surgery may involve:  Using a laser to clear the skin and remove hair follicles.  Opening and draining deep sores.  Removing the areas of skin that are diseased and scarred. Follow these instructions at home: Medicines   Take over-the-counter and prescription medicines only as told by your health care provider.  If you were prescribed an antibiotic medicine, take it as told  by your health care provider. Do not stop taking the antibiotic even if your condition improves. Skin care  If you have open wounds, cover them with a clean dressing as told by your health care provider. Keep wounds clean by washing them gently with soap and water when you bathe.  Do not  shave the areas where you get hidradenitis suppurativa.  Do not wear deodorant.  Wear loose-fitting clothes.  Try to avoid getting overheated or sweaty. If you get sweaty or wet, change into clean, dry clothes as soon as you can.  To help relieve pain and itchiness, cover sore areas with a warm, clean washcloth (warm compress) for 5-10 minutes as often as needed.  If told by your health care provider, take a bleach bath twice a week: ? Fill your bathtub halfway with water. ? Pour in  cup of unscented household bleach. ? Soak in the tub for 5-10 minutes. ? Only soak from the neck down. Avoid water on your face and hair. ? Shower to rinse off the bleach from your skin. General instructions  Learn as much as you can about your disease so that you have an active role in your treatment. Work closely with your health care provider to find treatments that work for you.  If you are overweight, work with your health care provider to lose weight as recommended.  Do not use any products that contain nicotine or tobacco, such as cigarettes and e-cigarettes. If you need help quitting, ask your health care provider.  If you struggle with living with this condition, talk with your health care provider or work with a mental health care provider as recommended.  Keep all follow-up visits as told by your health care provider. This is important. Where to find more information  Hidradenitis Suppurativa Foundation, Inc.: https://www.hs-foundation.org/ Contact a health care provider if you have:  A flare-up of hidradenitis suppurativa.  A fever or chills.  Trouble controlling your symptoms at home.  Trouble doing your daily activities because of your symptoms.  Trouble dealing with emotional problems related to your condition. Summary  Hidradenitis suppurativa is a long-term (chronic) skin disease. It is similar to a severe form of acne, but it affects areas of the body where acne would be  unusual.  The first symptoms are usually painful bumps in the skin, similar to pimples. The condition may get worse over time (progress), or it may only cause mild symptoms.  If you have open wounds, cover them with a clean dressing as told by your health care provider. Keep wounds clean by washing them gently with soap and water when you bathe.  Besides skin care, treatment may include medicines, laser treatment, and surgery. This information is not intended to replace advice given to you by your health care provider. Make sure you discuss any questions you have with your health care provider. Document Revised: 11/09/2017 Document Reviewed: 11/09/2017 Elsevier Patient Education  2020 [employer].

## 2020-07-10 NOTE — Addendum Note (Signed)
Addended by: Margaretha Sheffield on: 07/10/2020 10:47 AM   Modules accepted: Orders

## 2020-07-10 NOTE — Progress Notes (Signed)
Patient ID: Linda [name], female    DOB: 12/26/1990, 29 y.o.   MRN: [id number]   Chief Complaint  Patient presents with  . cysts in groin area   Subjective:    HPI Left inner thigh cyst has been ongoing for several months now. She goes on Doxy and it clears and then it recurs. Today it is not there. Denies fever/chills.  Medical History Summar has a past medical history of Anxiety, Asthma, Heart murmur, Hypothyroidism, IBS (irritable bowel syndrome), and Interstitial cystitis.   Outpatient Encounter Medications as of 07/10/2020  Medication Sig  . albuterol (VENTOLIN HFA) 108 (90 Base) MCG/ACT inhaler Inhale 2 puffs into the lungs every 4 (four) hours as needed for wheezing or shortness of breath.  . ALPRAZolam (XANAX) 1 MG tablet 0.5-1 mg daily as needed for anxiety  . clindamycin (CLEOCIN-T) 1 % external solution Apply topically 2 (two) times daily.  Marland Kitchen doxycycline (VIBRA-TABS) 100 MG tablet Take 1 tablet (100 mg total) by mouth 2 (two) times daily. Prn skin infection; do not take if pregnant (Patient not taking: Reported on 07/10/2020)  . fexofenadine (ALLEGRA) 180 MG tablet Take 180 mg by mouth daily as needed for allergies.   . fluconazole (DIFLUCAN) 150 MG tablet One po qd prn yeast infection; may repeat in 3-4 days if needed (Patient not taking: Reported on 07/10/2020)  . levothyroxine (SYNTHROID) 125 MCG tablet Take 1 tablet by mouth daily.  Melene Muller ON 07/19/2020] venlafaxine XR (EFFEXOR-XR) 150 MG 24 hr capsule Take 1 capsule (150 mg total) by mouth daily with breakfast.   No facility-administered encounter medications on file as of 07/10/2020.     Review of Systems  Constitutional: Negative for chills and fever.  Respiratory: Negative.   Cardiovascular: Negative.   Gastrointestinal: Negative.   Genitourinary:       Recurring cyst upper thigh/close to vaginal area (resolved at this visit).  Musculoskeletal: Negative.   Skin: Negative.      Vitals BP 122/82    Pulse 98   Temp (!) 97.4 F (36.3 C) (Oral)   Ht 5\' 5"  (1.651 m)   Wt 283 lb 3.2 oz (128.5 kg)   SpO2 100%   BMI 47.13 kg/m   Objective:   Physical Exam Vitals and nursing note reviewed.  Constitutional:      Appearance: Normal appearance.  Cardiovascular:     Rate and Rhythm: Normal rate and regular rhythm.     Heart sounds: Normal heart sounds.  Pulmonary:     Effort: Pulmonary effort is normal.     Breath sounds: Normal breath sounds.  Skin:    General: Skin is warm and dry.  Neurological:     Mental Status: She is alert and oriented to person, place, and time.      Assessment and Plan   1. Hidradenitis suppurativa of left axilla - clindamycin (CLEOCIN-T) 1 % external solution; Apply topically 2 (two) times daily.  Dispense: 60 mL; Refill: 0   Linda [name] presents today to discuss removal of cyst that has recurred several times on her upper thigh area. She just completed a course of Doxy and it has resolved for now. She reports that it will recur soon. She will contact her GYN to see if they will remove it, understanding that they may make a referral to general surgery for removal.  Linda [name] also points out areas of scarring where she had inflamed lesions that have healed but have left scars. She has been seeking care from  an online derm for this. She wishes to seek care from a local derm for this condition.  I explained that a referral and appointment  could take several weeks, so I prescribed Clindamycin solution for her to apply twice per day to help to manage the discomfort until she can see derm.  Agrees with plan of care discussed today. Understands warning signs to seek further care: fever, increased pain from lesions/cyst. Understands to follow-up if symptoms do not improve or if anything changes. She will contact her GYN to assess area for removal and await derm referral from this office.

## 2020-07-14 ENCOUNTER — Encounter: Payer: Self-pay | Admitting: Emergency Medicine

## 2020-07-14 ENCOUNTER — Ambulatory Visit
Admission: EM | Admit: 2020-07-14 | Discharge: 2020-07-14 | Disposition: A | Discharge status: Home / Self Care | Payer: Medicaid Other | Attending: Emergency Medicine | Admitting: Emergency Medicine

## 2020-07-14 ENCOUNTER — Other Ambulatory Visit: Payer: Self-pay

## 2020-07-14 DIAGNOSIS — H66002 Acute suppurative otitis media without spontaneous rupture of ear drum, left ear: Secondary | ICD-10-CM | POA: Diagnosis not present

## 2020-07-14 DIAGNOSIS — H9202 Otalgia, left ear: Secondary | ICD-10-CM

## 2020-07-14 MED ORDER — AMOXICILLIN 500 MG PO CAPS: 500.0000 mg | ORAL_CAPSULE | Freq: Two times a day (BID) | ORAL | 0 refills | Status: AC

## 2020-07-14 MED ORDER — MELOXICAM 15 MG PO TABS: 15.0000 mg | ORAL_TABLET | Freq: Every day | ORAL | 0 refills | Status: DC

## 2020-07-14 NOTE — ED Triage Notes (Signed)
left ear pain for several weeks causing headaches, worse today

## 2020-07-14 NOTE — ED Provider Notes (Signed)
[facility]   [id number] 07/14/20 Arrival Time: 1222  CC:EAR PAIN  SUBJECTIVE: History from: patient.  Linda [name] is a 29 y.o. female who presents with of LT ear pain x "few" weeks.  Denies a precipitating event, such as swimming or wearing ear plugs.  Patient states the pain is constant and achy in character.  Patient has tried OTC medications without relief.  Symptoms are made worse with lying down.  Reports similar symptoms in the past with ear infection.  Complains of muffled hearing.    Denies fever, chills, fatigue, sinus pain, rhinorrhea, ear discharge, sore throat, SOB, wheezing, chest pain, nausea, changes in bowel or bladder habits.    ROS: As per HPI.  All other pertinent ROS negative.     Past Medical History:  Diagnosis Date  . Anxiety   . Asthma   . Heart murmur   . Hypothyroidism   . IBS (irritable bowel syndrome)   . Interstitial cystitis    Past Surgical History:  Procedure Laterality Date  . APPENDECTOMY    . CESAREAN SECTION N/A 03/20/2013   Procedure: CESAREAN SECTION;  Surgeon: [name], MD;  Location: [facility];  Service: Obstetrics;  Laterality: N/A;  . CESAREAN SECTION    . LAPAROSCOPIC APPENDECTOMY N/A 10/16/2019   Procedure: APPENDECTOMY LAPAROSCOPIC;  Surgeon: Franky Macho, MD;  Location: [facility];  Service: General;  Laterality: N/A;  . MYRINGOTOMY WITH TUBE PLACEMENT    . NO PAST SURGERIES    . TYMPANOSTOMY TUBE PLACEMENT    . UPPER GI ENDOSCOPY     Allergies  Allergen Reactions  . Ciprofloxacin Nausea Only  . Sulfa Antibiotics Diarrhea and Nausea And Vomiting  . Buspar [Buspirone] Anxiety  . Levaquin [Levofloxacin] Anxiety   No current facility-administered medications on file prior to encounter.   Current Outpatient Medications on File Prior to Encounter  Medication Sig Dispense Refill  . albuterol (VENTOLIN HFA) 108 (90 Base) MCG/ACT inhaler Inhale 2 puffs into the lungs every 4 (four) hours as needed for wheezing or  shortness of breath. 1 Inhaler 1  . ALPRAZolam (XANAX) 1 MG tablet 0.5-1 mg daily as needed for anxiety 30 tablet 2  . clindamycin (CLEOCIN-T) 1 % external solution Apply topically 2 (two) times daily. 60 mL 0  . fexofenadine (ALLEGRA) 180 MG tablet Take 180 mg by mouth daily as needed for allergies.     Marland Kitchen levothyroxine (SYNTHROID) 125 MCG tablet Take 1 tablet by mouth daily.    Melene Muller ON 07/19/2020] venlafaxine XR (EFFEXOR-XR) 150 MG 24 hr capsule Take 1 capsule (150 mg total) by mouth daily with breakfast. 30 capsule 1   Social History   Socioeconomic History  . Marital status: Single    Spouse name: Not on file  . Number of children: Not on file  . Years of education: Not on file  . Highest education level: Not on file  Occupational History  . Not on file  Tobacco Use  . Smoking status: Former Smoker    Packs/day: 0.50    Types: Cigarettes    Start date: 01/28/2013  . Smokeless tobacco: Never Used  Vaping Use  . Vaping Use: Former  Substance and Sexual Activity  . Alcohol use: No    Alcohol/week: 0.0 standard drinks  . Drug use: No  . Sexual activity: Yes    Partners: Male    Birth control/protection: None  Other Topics Concern  . Not on file  Social History Narrative   **  Merged History Encounter **       Social Determinants of Health   Financial Resource Strain:   . Difficulty of Paying Living Expenses: Not on file  Food Insecurity:   . Worried About Programme researcher, broadcasting/film/video in the Last Year: Not on file  . Ran Out of Food in the Last Year: Not on file  Transportation Needs:   . Lack of Transportation (Medical): Not on file  . Lack of Transportation (Non-Medical): Not on file  Physical Activity:   . Days of Exercise per Week: Not on file  . Minutes of Exercise per Session: Not on file  Stress:   . Feeling of Stress : Not on file  Social Connections:   . Frequency of Communication with Friends and Family: Not on file  . Frequency of Social Gatherings with Friends  and Family: Not on file  . Attends Religious Services: Not on file  . Active Member of Clubs or Organizations: Not on file  . Attends Banker Meetings: Not on file  . Marital Status: Not on file  Intimate Partner Violence:   . Fear of Current or Ex-Partner: Not on file  . Emotionally Abused: Not on file  . Physically Abused: Not on file  . Sexually Abused: Not on file   Family History  Problem Relation Age of Onset  . Cancer Paternal Uncle   . COPD Maternal Grandfather   . Stroke Paternal Grandfather   . Depression Mother   . OCD Mother   . Bipolar disorder Paternal Aunt   . Drug abuse Maternal Uncle   . Depression Maternal Grandmother   . Anxiety disorder Maternal Grandmother     OBJECTIVE:  Vitals:   07/14/20 1311  BP: 131/82  Pulse: 93  Resp: 17  Temp: 98 F (36.7 C)  TempSrc: Oral  SpO2: 96%     General appearance: alert; well-appearing, nontoxic; speaking in full sentences and tolerating own secretions HEENT: NCAT; Ears: EACs clear, RT TM pearly gray, LT TM erythematous and tense; Eyes: PERRL.  EOM grossly intact.Nose: nares patent without rhinorrhea, Throat: oropharynx clear, tonsils non erythematous or enlarged, uvula midline  Neck: supple without LAD Lungs: unlabored respirations, symmetrical air entry; cough: absent; no respiratory distress; CTAB Heart: regular rate and rhythm.  Skin: warm and dry Psychological: alert and cooperative; normal mood and affect    ASSESSMENT & PLAN:  1. Non-recurrent acute suppurative otitis media of left ear without spontaneous rupture of tympanic membrane   2. Left ear pain     Meds ordered this encounter  Medications  . amoxicillin (AMOXIL) 500 MG capsule    Sig: Take 1 capsule (500 mg total) by mouth 2 (two) times daily for 10 days.    Dispense:  20 capsule    Refill:  0    Order Specific Question:   Supervising Provider    Answer:   Eustace Moore [[id number]]  . meloxicam (MOBIC) 15 MG tablet     Sig: Take 1 tablet (15 mg total) by mouth daily.    Dispense:  10 tablet    Refill:  0    Order Specific Question:   Supervising Provider    Answer:   Eustace Moore [[id number]]    Rest and drink plenty of fluids Amoxicillin prescribed.  Take as directed and to completion Mobic prescribed.  Use as directed for pain Follow up with PCP if symptoms persists Return here or go to the ER if you have any new  or worsening symptoms fever, chills, nausea, vomiting, redness, swelling, drainage, etc...  Reviewed expectations re: course of current medical issues. Questions answered. Outlined signs and symptoms indicating need for more acute intervention. Patient verbalized understanding. After Visit Summary given.         Rennis Harding, PA-C 07/14/20 1350

## 2020-07-14 NOTE — Discharge Instructions (Signed)
Rest and drink plenty of fluids Amoxicillin prescribed.  Take as directed and to completion Mobic prescribed.  Use as directed for pain Follow up with PCP if symptoms persists Return here or go to the ER if you have any new or worsening symptoms fever, chills, nausea, vomiting, redness, swelling, drainage, etc..Marland Kitchen

## 2020-07-31 ENCOUNTER — Other Ambulatory Visit: Payer: Self-pay

## 2020-07-31 ENCOUNTER — Other Ambulatory Visit: Payer: Medicaid Other

## 2020-07-31 ENCOUNTER — Other Ambulatory Visit: Payer: Self-pay | Admitting: *Deleted

## 2020-07-31 DIAGNOSIS — Z20822 Contact with and (suspected) exposure to covid-19: Secondary | ICD-10-CM | POA: Diagnosis not present

## 2020-08-02 LAB — SPECIMEN STATUS REPORT

## 2020-08-02 LAB — SARS-COV-2, NAA 2 DAY TAT

## 2020-08-02 LAB — NOVEL CORONAVIRUS, NAA: SARS-CoV-2, NAA: NOT DETECTED

## 2020-08-11 DIAGNOSIS — E038 Other specified hypothyroidism: Secondary | ICD-10-CM | POA: Diagnosis not present

## 2020-08-11 DIAGNOSIS — E063 Autoimmune thyroiditis: Secondary | ICD-10-CM | POA: Diagnosis not present

## 2020-08-12 DIAGNOSIS — N938 Other specified abnormal uterine and vaginal bleeding: Secondary | ICD-10-CM | POA: Diagnosis not present

## 2020-08-18 DIAGNOSIS — Z1159 Encounter for screening for other viral diseases: Secondary | ICD-10-CM | POA: Diagnosis not present

## 2020-08-20 ENCOUNTER — Other Ambulatory Visit: Payer: Self-pay

## 2020-08-20 ENCOUNTER — Telehealth: Payer: Self-pay

## 2020-08-20 ENCOUNTER — Encounter: Payer: Self-pay | Admitting: Family Medicine

## 2020-08-20 ENCOUNTER — Telehealth (INDEPENDENT_AMBULATORY_CARE_PROVIDER_SITE_OTHER): Payer: Medicaid Other | Admitting: Family Medicine

## 2020-08-20 DIAGNOSIS — U071 COVID-19: Secondary | ICD-10-CM | POA: Diagnosis not present

## 2020-08-20 NOTE — Telephone Encounter (Signed)
Pt has tested positive for covid and would like to get the infusion done.

## 2020-08-20 NOTE — Telephone Encounter (Signed)
Called and scheduled appt for today to discuss

## 2020-08-20 NOTE — Progress Notes (Signed)
Patient ID: Linda [name], female    DOB: 05/25/1991, 29 y.o.   MRN: [id number]   Virtual Visit via Telephone Note  I connected with Linda [name] on 08/20/20 at  3:50 PM EDT by telephone and verified that I am speaking with the correct [name] using two identifiers.  Location: Patient: home Provider: office   I discussed the limitations, risks, security and privacy concerns of performing an evaluation and management service by telephone and the availability of in [name] appointments. I also discussed with the patient that there may be a patient responsible charge related to this service. The patient expressed understanding and agreed to proceed.   Chief Complaint  Patient presents with  . Covid Positive   Subjective:    HPI  Pt having covid illness. 3 days ago woke up w/o taste or smell. Then had bad HA and congestion. Then 2 days ago had covid testing.  Then had positive result.  Also having body aches/joint aches, no taste or smell, cough, and congestion. Taking mucinex and tylenol.  Would like to get covid antibody infusion.   Had fever of 101F max. No fever today. Sick contacts- none.  Pt taking vit B and D in past. Has tried zinc in past.   Medical History Linda [name] has a past medical history of Anxiety, Asthma, Heart murmur, Hypothyroidism, IBS (irritable bowel syndrome), and Interstitial cystitis.   Outpatient Encounter Medications as of 08/20/2020  Medication Sig  . albuterol (VENTOLIN HFA) 108 (90 Base) MCG/ACT inhaler Inhale 2 puffs into the lungs every 4 (four) hours as needed for wheezing or shortness of breath.  . ALPRAZolam (XANAX) 1 MG tablet 0.5-1 mg daily as needed for anxiety  . clindamycin (CLEOCIN-T) 1 % external solution Apply topically 2 (two) times daily.  . fexofenadine (ALLEGRA) 180 MG tablet Take 180 mg by mouth daily as needed for allergies.   Marland Kitchen levothyroxine (SYNTHROID) 125 MCG tablet Take 1 tablet by mouth daily.  Marland Kitchen venlafaxine XR  (EFFEXOR-XR) 150 MG 24 hr capsule Take 1 capsule (150 mg total) by mouth daily with breakfast.  . [DISCONTINUED] meloxicam (MOBIC) 15 MG tablet Take 1 tablet (15 mg total) by mouth daily.   No facility-administered encounter medications on file as of 08/20/2020.     Review of Systems  Constitutional: Positive for fatigue and fever. Negative for chills.  HENT: Positive for congestion. Negative for ear pain, rhinorrhea, sinus pressure, sinus pain and sore throat.        +loss taste/smell  Eyes: Negative for pain, discharge and itching.  Respiratory: Negative for cough, chest tightness and shortness of breath.   Gastrointestinal: Negative for constipation, diarrhea, nausea and vomiting.  Musculoskeletal: Positive for arthralgias and myalgias.  Neurological: Positive for headaches.     Vitals There were no vitals taken for this visit.  Objective:   Physical Exam  Phone visit, no PE  Assessment and Plan   1. COVID-19    Covid 19 treatment recommendations- It's symptomatic tx for viral illness. Take mucinex as directed, could change to delsym if needed.  Increase fluid intake. Tylenol/ibuprofen for headache/fever. Vit C, vit D and zinc are recommended vitamins to take.  zinc dose of 75 mg-100mg . daily doses of vitamin D3 (1,000-4,000 IU), vitamin C (500 mg), and melatonin (0.3mg -2 mg each night).  Flonase for nasal congestion.  If not getting better with all this, then need to go to ER or urgent care for further evaluation and check of vitals.  Will call and  add pt to the monoclonal ab infusion hotline. Advising to call if not improving or worsening coughing, sob, or fever.  F/u prn.   Follow Up Instructions:    I discussed the assessment and treatment plan with the patient. The patient was provided an opportunity to ask questions and all were answered. The patient agreed with the plan and demonstrated an understanding of the instructions.   The patient was advised to  call back or seek an in-[name] evaluation if the symptoms worsen or if the condition fails to improve as anticipated.  I provided 13 minutes of non-face-to-face time during this encounter.

## 2020-08-22 ENCOUNTER — Encounter: Payer: Self-pay | Admitting: Nurse Practitioner

## 2020-08-22 ENCOUNTER — Other Ambulatory Visit (HOSPITAL_COMMUNITY): Payer: Self-pay | Admitting: Nurse Practitioner

## 2020-08-22 ENCOUNTER — Ambulatory Visit (HOSPITAL_COMMUNITY)
Admission: RE | Admit: 2020-08-22 | Discharge: 2020-08-22 | Disposition: A | Discharge status: Home / Self Care | Payer: Medicaid Other | Source: Ambulatory Visit | Attending: Pulmonary Disease | Admitting: Pulmonary Disease

## 2020-08-22 DIAGNOSIS — U071 COVID-19: Secondary | ICD-10-CM

## 2020-08-22 MED ORDER — FAMOTIDINE IN NACL 20-0.9 MG/50ML-% IV SOLN: 20.0000 mg | Freq: Once | INTRAVENOUS | Status: DC | PRN

## 2020-08-22 MED ORDER — ALBUTEROL SULFATE HFA 108 (90 BASE) MCG/ACT IN AERS: 2.0000 | INHALATION_SPRAY | Freq: Once | RESPIRATORY_TRACT | Status: DC | PRN

## 2020-08-22 MED ORDER — SODIUM CHLORIDE 0.9 % IV SOLN: INTRAVENOUS | Status: DC | PRN

## 2020-08-22 MED ORDER — DIPHENHYDRAMINE HCL 50 MG/ML IJ SOLN: 50.0000 mg | Freq: Once | INTRAMUSCULAR | Status: DC | PRN

## 2020-08-22 MED ORDER — SODIUM CHLORIDE 0.9 % IV SOLN: Freq: Once | INTRAVENOUS | Status: AC

## 2020-08-22 MED ORDER — METHYLPREDNISOLONE SODIUM SUCC 125 MG IJ SOLR: 125.0000 mg | Freq: Once | INTRAMUSCULAR | Status: DC | PRN

## 2020-08-22 MED ORDER — EPINEPHRINE 0.3 MG/0.3ML IJ SOAJ: 0.3000 mg | Freq: Once | INTRAMUSCULAR | Status: DC | PRN

## 2020-08-22 NOTE — Discharge Instructions (Signed)

## 2020-08-22 NOTE — Progress Notes (Signed)
  Diagnosis: COVID-19  Physician:Dr Wright  Procedure: Covid Infusion Clinic Med: casirivimab\imdevimab infusion - Provided patient with casirivimab\imdevimab fact sheet for patients, parents and caregivers prior to infusion.  Complications: No immediate complications noted.  Discharge: Discharged home   Linda [name] 08/22/2020  

## 2020-08-22 NOTE — Progress Notes (Signed)
I connected by phone with Linda [name] on 08/22/2020 at 10:56 AM to discuss the potential use of an new treatment for mild to moderate COVID-19 viral infection in non-hospitalized patients.  This patient is a 29 y.o. female that meets the FDA criteria for Emergency Use Authorization of casirivimab\imdevimab.  Has a (+) direct SARS-CoV-2 viral test result  Has mild or moderate COVID-19   Is ? 28 years of age and weighs ? 40 kg  Is NOT hospitalized due to COVID-19  Is NOT requiring oxygen therapy or requiring an increase in baseline oxygen flow rate due to COVID-19  Is within 10 days of symptom onset  Has at least one of the high risk factor(s) for progression to severe COVID-19 and/or hospitalization as defined in EUA.  Specific high risk criteria : BMI > 25 and Other high risk medical condition per CDC:  SVI   Onset 08/17/20.    I have spoken and communicated the following to the patient or parent/caregiver:  1. FDA has authorized the emergency use of casirivimab\imdevimab for the treatment of mild to moderate COVID-19 in adults and pediatric patients with positive results of direct SARS-CoV-2 viral testing who are 37 years of age and older weighing at least 40 kg, and who are at high risk for progressing to severe COVID-19 and/or hospitalization.  2. The significant known and potential risks and benefits of casirivimab\imdevimab, and the extent to which such potential risks and benefits are unknown.  3. Information on available alternative treatments and the risks and benefits of those alternatives, including clinical trials.  4. Patients treated with casirivimab\imdevimab should continue to self-isolate and use infection control measures (e.g., wear mask, isolate, social distance, avoid sharing personal items, clean and disinfect "high touch" surfaces, and frequent handwashing) according to CDC guidelines.   5. The patient or parent/caregiver has the option to accept or refuse  casirivimab\imdevimab .  After reviewing this information with the patient, the patient has agreed to receive one of the available covid 19 monoclonal antibodies and will be provided an appropriate fact sheet prior to infusion.[name], DNP, AGNP-C [phone] (Infusion Center Hotline)

## 2020-08-23 ENCOUNTER — Other Ambulatory Visit (HOSPITAL_COMMUNITY): Payer: Self-pay

## 2020-09-15 NOTE — Progress Notes (Signed)
Virtual Visit via Video Note  I connected with Linda [name] on 09/18/20 at  1:20 PM EDT by a video enabled telemedicine application and verified that I am speaking with the correct person using two identifiers.  Location: Patient: car Provider: office    I discussed the limitations of evaluation and management by telemedicine and the availability of in person appointments. The patient expressed understanding and agreed to proceed.    I discussed the assessment and treatment plan with the patient. The patient was provided an opportunity to ask questions and all were answered. The patient agreed with the plan and demonstrated an understanding of the instructions.   The patient was advised to call back or seek an in-person evaluation if the symptoms worsen or if the condition fails to improve as anticipated.  I provided 15 minutes of non-face-to-face time during this encounter.   Neysa Hottereina Deloma Spindle, MD    MD/PA/NP OP Progress Note  09/18/2020 1:59 PM Linda [name]  MRN:  [id number]  Chief Complaint:  Chief Complaint    Depression; Anxiety; Follow-up     HPI:  This is a follow-up appointment for depression and anxiety.  She states that she is now in the process of termination of rights to her son by the father.  She tends to feel down or anxious when he is not cooperative about this.  She thinks it is the best decision, referring to her marriage in August.  She reports good relationship with her husband and her son.  Her son is now on second grade, and he is doing well.  She also talks about her family's medical health issues.  Her maternal grandfather will have surgery for AAA, and he has dislocated hernia.  Her mother goes to [facility] for RA.  She feels worried about them.  She denies any concerns at work.  She has occasional insomnia.  She feels down at times.  She has fair concentration.  She denies any change in her appetite or weight.  She denies SI.  She feels anxious and tense at  times.  She has occasional panic attacks, and takes xanax a few times per week or less.    Employment: works for [employer],  used to work as Social workerhair stylist Support: Household:  Marital status: married in 8/201.  Divorced once.  Number of children: 1 son [place]. She reports great relationship with her parents.   272-275 lbs Wt Readings from Last 3 Encounters:  07/10/20 283 lb 3.2 oz (128.5 kg)  06/05/20 (!) 284 lb (128.8 kg)  12/30/19 280 lb (127 kg)     Visit Diagnosis:    ICD-10-CM   1. MDD (major depressive disorder), recurrent, in partial remission (HCC)  F33.41   2. GAD (generalized anxiety disorder)  F41.1   3. Panic disorder  F41.0     Past Psychiatric History: Please see initial evaluation for full details. I have reviewed the history. No updates at this time.     Past Medical History:  Past Medical History:  Diagnosis Date  . Anxiety   . Asthma   . Heart murmur   . Hypothyroidism   . IBS (irritable bowel syndrome)   . Interstitial cystitis     Past Surgical History:  Procedure Laterality Date  . APPENDECTOMY    . CESAREAN SECTION N/A 03/20/2013   Procedure: CESAREAN SECTION;  Surgeon: Oliver PilaKathy W Richardson, MD;  Location: [facility];  Service: Obstetrics;  Laterality: N/A;  . CESAREAN SECTION    .  LAPAROSCOPIC APPENDECTOMY N/A 10/16/2019   Procedure: APPENDECTOMY LAPAROSCOPIC;  Surgeon: Franky Macho, MD;  Location: [facility];  Service: General;  Laterality: N/A;  . MYRINGOTOMY WITH TUBE PLACEMENT    . NO PAST SURGERIES    . TYMPANOSTOMY TUBE PLACEMENT    . UPPER GI ENDOSCOPY      Family Psychiatric History: Please see initial evaluation for full details. I have reviewed the history. No updates at this time.     Family History:  Family History  Problem Relation Age of Onset  . Cancer Paternal Uncle   . COPD Maternal Grandfather   . Stroke Paternal Grandfather   . Depression Mother   . OCD Mother   . Bipolar disorder Paternal Aunt    . Drug abuse Maternal Uncle   . Depression Maternal Grandmother   . Anxiety disorder Maternal Grandmother     Social History:  Social History   Socioeconomic History  . Marital status: Single    Spouse name: Not on file  . Number of children: Not on file  . Years of education: Not on file  . Highest education level: Not on file  Occupational History  . Not on file  Tobacco Use  . Smoking status: Former Smoker    Packs/day: 0.50    Types: Cigarettes    Start date: 01/28/2013  . Smokeless tobacco: Never Used  Vaping Use  . Vaping Use: Former  Substance and Sexual Activity  . Alcohol use: No    Alcohol/week: 0.0 standard drinks  . Drug use: No  . Sexual activity: Yes    Partners: Male    Birth control/protection: None  Other Topics Concern  . Not on file  Social History Narrative   ** Merged History Encounter **       Social Determinants of Health   Financial Resource Strain:   . Difficulty of Paying Living Expenses: Not on file  Food Insecurity:   . Worried About Programme researcher, broadcasting/film/video in the Last Year: Not on file  . Ran Out of Food in the Last Year: Not on file  Transportation Needs:   . Lack of Transportation (Medical): Not on file  . Lack of Transportation (Non-Medical): Not on file  Physical Activity:   . Days of Exercise per Week: Not on file  . Minutes of Exercise per Session: Not on file  Stress:   . Feeling of Stress : Not on file  Social Connections:   . Frequency of Communication with Friends and Family: Not on file  . Frequency of Social Gatherings with Friends and Family: Not on file  . Attends Religious Services: Not on file  . Active Member of Clubs or Organizations: Not on file  . Attends Banker Meetings: Not on file  . Marital Status: Not on file    Allergies:  Allergies  Allergen Reactions  . Ciprofloxacin Nausea Only  . Sulfa Antibiotics Diarrhea and Nausea And Vomiting  . Buspar [Buspirone] Anxiety  . Levaquin  [Levofloxacin] Anxiety    Metabolic Disorder Labs: Lab Results  Component Value Date   HGBA1C 5.4 06/05/2020   No results found for: PROLACTIN No results found for: CHOL, TRIG, HDL, CHOLHDL, VLDL, LDLCALC Lab Results  Component Value Date   TSH 28.86 (H) 07/26/2018   TSH 4.370 11/24/2016    Therapeutic Level Labs: No results found for: LITHIUM No results found for: VALPROATE No components found for:  CBMZ  Current Medications: Current Outpatient Medications  Medication Sig Dispense Refill  .  albuterol (VENTOLIN HFA) 108 (90 Base) MCG/ACT inhaler Inhale 2 puffs into the lungs every 4 (four) hours as needed for wheezing or shortness of breath. 1 Inhaler 1  . ALPRAZolam (XANAX) 1 MG tablet 0.5-1 mg daily as needed for anxiety 30 tablet 2  . clindamycin (CLEOCIN-T) 1 % external solution Apply topically 2 (two) times daily. 60 mL 0  . fexofenadine (ALLEGRA) 180 MG tablet Take 180 mg by mouth daily as needed for allergies.     Marland Kitchen levothyroxine (SYNTHROID) 125 MCG tablet Take 1 tablet by mouth daily.    Marland Kitchen venlafaxine XR (EFFEXOR-XR) 150 MG 24 hr capsule 225 mg daily. Take along with 75 mg cap 30 capsule 1  . venlafaxine XR (EFFEXOR-XR) 75 MG 24 hr capsule 225 mg daily. Take along with 150 mg cap 30 capsule 1   No current facility-administered medications for this visit.     Musculoskeletal: Strength & Muscle Tone: N/A Gait & Station: N/A Patient leans: N/A  Psychiatric Specialty Exam: Review of Systems  Psychiatric/Behavioral: Positive for dysphoric mood. Negative for agitation, behavioral problems, confusion, decreased concentration, hallucinations, self-injury, sleep disturbance and suicidal ideas. The patient is nervous/anxious. The patient is not hyperactive.   All other systems reviewed and are negative.   There were no vitals taken for this visit.There is no height or weight on file to calculate BMI.  General Appearance: Fairly Groomed  Eye Contact:  Good  Speech:   Clear and Coherent  Volume:  Normal  Mood:  good  Affect:  Appropriate, Congruent and slightly down  Thought Process:  Coherent  Orientation:  Full (Time, Place, and Person)  Thought Content: Logical   Suicidal Thoughts:  No  Homicidal Thoughts:  No  Memory:  Immediate;   Good  Judgement:  Good  Insight:  Good  Psychomotor Activity:  Normal  Concentration:  Concentration: Good and Attention Span: Good  Recall:  Good  Fund of Knowledge: Good  Language: Good  Akathisia:  No  Handed:  Right  AIMS (if indicated): not done  Assets:  Communication Skills Desire for Improvement  ADL's:  Intact  Cognition: WNL  Sleep:  Fair   Screenings: GAD-7     Office Visit from 03/07/2018 in [facility] Office Visit from 11/22/2017 in [facility]  Total GAD-7 Score 14 15    PHQ2-9     Office Visit from 03/07/2018 in [facility]  PHQ-2 Total Score 5  PHQ-9 Total Score 18       Assessment and Plan:  ZANE SAMSON is a 29 y.o. year old female with a history of depression, anxiety, ADHD, who presents for follow up appointment for below.   1. MDD (major depressive disorder), recurrent, in partial remission (HCC) 2. GAD (generalized anxiety disorder) 3. Panic disorder She reports anxiety and panic attacks in the context of conflict with the father of her son/process of termination of rights to her son by him.  We uptitrate venlafaxine to optimize treatment for anxiety and depression.  We will continue Xanax as needed for anxiety.  Discussed risk of dependence and oversedation.   #ADHD(Patient has history of ADHD since age 47. She reportedly underwent neuropsychological evaluation) She reports good concentration on today's evaluation.  We will continue to monitor as needed.   Plan 1. Increase venlafaxine 225 mg daily- monitor weight gain  2.Continue Xanax 1 mg daily as needed for anxiety- refills left according to PMP 3. Next  appointment:12/23 at 2:11for 20 mins, video -  on hydrocodone - thyroid was checked, reportedly normal -sleep study in 2021- increased sleep latency, noOSA according to the patient  Past trials of medication:sertraline ("Zombie"),citalopram, lithium, Depakote, Abilify (visual impairment),xanax, ativan, clonazepam, adderall,Ritalin, Concerta,Trazodone (hypersomnia)  The patient demonstrates the following risk factors for suicide: Chronic risk factors for suicide include:psychiatric disorder ofanxiety. Acute risk factorsfor suicide include: N/A. Protective factorsfor this patient include: positive social support, responsibility to others (children, family), coping skills and hope for the future. Considering these factors, the overall suicide   Neysa Hotter, MD 09/18/2020, 1:59 PM

## 2020-09-18 ENCOUNTER — Telehealth (INDEPENDENT_AMBULATORY_CARE_PROVIDER_SITE_OTHER): Payer: Medicaid Other | Admitting: Psychiatry

## 2020-09-18 ENCOUNTER — Telehealth (HOSPITAL_COMMUNITY): Payer: Medicaid Other | Admitting: Psychiatry

## 2020-09-18 ENCOUNTER — Encounter: Payer: Self-pay | Admitting: Psychiatry

## 2020-09-18 ENCOUNTER — Other Ambulatory Visit: Payer: Self-pay

## 2020-09-18 DIAGNOSIS — F3341 Major depressive disorder, recurrent, in partial remission: Secondary | ICD-10-CM | POA: Diagnosis not present

## 2020-09-18 DIAGNOSIS — F411 Generalized anxiety disorder: Secondary | ICD-10-CM

## 2020-09-18 DIAGNOSIS — F410 Panic disorder [episodic paroxysmal anxiety] without agoraphobia: Secondary | ICD-10-CM | POA: Diagnosis not present

## 2020-09-18 MED ORDER — VENLAFAXINE HCL ER 150 MG PO CP24
ORAL_CAPSULE | ORAL | 1 refills | Status: DC
Start: 2020-09-18 — End: 2020-11-06

## 2020-09-18 MED ORDER — VENLAFAXINE HCL ER 75 MG PO CP24: ORAL_CAPSULE | ORAL | 1 refills | Status: DC

## 2020-09-18 NOTE — Patient Instructions (Signed)
1. Increase venlafaxine 225 mg daily   2.Continue Xanax 1 mg daily as needed for anxiety 3. Next appointment:12/23 at 2:40

## 2020-10-01 ENCOUNTER — Encounter: Payer: Medicaid Other | Admitting: Family Medicine

## 2020-10-17 ENCOUNTER — Encounter: Payer: Self-pay | Admitting: Nurse Practitioner

## 2020-10-17 ENCOUNTER — Other Ambulatory Visit: Payer: Self-pay | Admitting: Nurse Practitioner

## 2020-10-17 MED ORDER — TERCONAZOLE 0.4 % VA CREA: 1.0000 | TOPICAL_CREAM | Freq: Every day | VAGINAL | 0 refills | Status: DC

## 2020-10-17 MED ORDER — FLUCONAZOLE 150 MG PO TABS: ORAL_TABLET | ORAL | 0 refills | Status: DC

## 2020-10-30 ENCOUNTER — Telehealth: Payer: Self-pay

## 2020-10-30 ENCOUNTER — Other Ambulatory Visit: Payer: Self-pay | Admitting: *Deleted

## 2020-10-30 NOTE — Telephone Encounter (Signed)
Refill request sent to provider.

## 2020-10-30 NOTE — Telephone Encounter (Signed)
Patient is requesting a refill on albuterol inhaler called into [facility]

## 2020-10-31 DIAGNOSIS — E038 Other specified hypothyroidism: Secondary | ICD-10-CM | POA: Diagnosis not present

## 2020-10-31 DIAGNOSIS — R7303 Prediabetes: Secondary | ICD-10-CM | POA: Diagnosis not present

## 2020-10-31 DIAGNOSIS — E063 Autoimmune thyroiditis: Secondary | ICD-10-CM | POA: Diagnosis not present

## 2020-10-31 MED ORDER — ALBUTEROL SULFATE HFA 108 (90 BASE) MCG/ACT IN AERS: 2.0000 | INHALATION_SPRAY | RESPIRATORY_TRACT | 1 refills | Status: DC | PRN

## 2020-10-31 NOTE — Progress Notes (Signed)
Virtual Visit via Video Note  I connected with Linda [name] on 11/06/20 at  2:40 PM EST by a video enabled telemedicine application and verified that I am speaking with the correct [name] using two identifiers.  Location: Patient: home Provider: office Persons participated in the visit- patient, provider   I discussed the limitations of evaluation and management by telemedicine and the availability of in [name] appointments. The patient expressed understanding and agreed to proceed.     I discussed the assessment and treatment plan with the patient. The patient was provided an opportunity to ask questions and all were answered. The patient agreed with the plan and demonstrated an understanding of the instructions.   The patient was advised to call back or seek an in-[name] evaluation if the symptoms worsen or if the condition fails to improve as anticipated.  I provided 13 minutes of non-face-to-face time during this encounter.   Linda Hotter, MD     [facility] MD/PA/NP OP Progress Note  11/06/2020 3:00 PM Linda [name]  MRN:  [id number]  Chief Complaint:  Chief Complaint    Follow-up; Depression; Anxiety     HPI:  This is a follow-up appointment for depression and anxiety.  She states that termination of rights to her son by the father was approved.  Her son does not recognize this, although she will share this when he grows up.  Although she notes that it was right decision as he had never been in a good example in her son's life, she feels sad about this.  She states that her son has had slightly worsening in his behaviors when he is upset.  She has been able to be attentive to him when needed.  She denies any concerns at work, and has been handling things well.  Levothyroxine dose was she recently tapered down.  She has been trying to adjust to this.  She has fair sleep.  She feels down at times.  She has difficulty in concentration.  She has decreased appetite.  She denies  anhedonia.  She feels fatigue.  She denies SI.  She feels anxious and tense.  She has occasional panic attacks. She feels irritable at times.  She is willing to stay on the current medication to see if her mood improves as she gets used to the new dose of levothyroxine. She denies alcohol use or drug use.   Employment: works for [employer],  used to work as Social worker Support: Household:  Marital status: married in 8/201.  Divorced once.  Number of children: 1 son She grew up in [place]. She reports great relationship with her parents.  Visit Diagnosis:    ICD-10-CM   1. MDD (major depressive disorder), recurrent, in partial remission (HCC)  F33.41   2. GAD (generalized anxiety disorder)  F41.1   3. Panic disorder  F41.0     Past Psychiatric History: Please see initial evaluation for full details. I have reviewed the history. No updates at this time.     Past Medical History:  Past Medical History:  Diagnosis Date  . Anxiety   . Asthma   . Heart murmur   . Hypothyroidism   . IBS (irritable bowel syndrome)   . Interstitial cystitis     Past Surgical History:  Procedure Laterality Date  . APPENDECTOMY    . CESAREAN SECTION N/A 03/20/2013   Procedure: CESAREAN SECTION;  Surgeon: [name], MD;  Location: [facility];  Service: Obstetrics;  Laterality: N/A;  .  CESAREAN SECTION    . LAPAROSCOPIC APPENDECTOMY N/A 10/16/2019   Procedure: APPENDECTOMY LAPAROSCOPIC;  Surgeon: Franky Macho, MD;  Location: [facility];  Service: General;  Laterality: N/A;  . MYRINGOTOMY WITH TUBE PLACEMENT    . NO PAST SURGERIES    . TYMPANOSTOMY TUBE PLACEMENT    . UPPER GI ENDOSCOPY      Family Psychiatric History: Please see initial evaluation for full details. I have reviewed the history. No updates at this time.     Family History:  Family History  Problem Relation Age of Onset  . Cancer Paternal Uncle   . COPD Maternal Grandfather   . Stroke Paternal Grandfather   .  Depression Mother   . OCD Mother   . Bipolar disorder Paternal Aunt   . Drug abuse Maternal Uncle   . Depression Maternal Grandmother   . Anxiety disorder Maternal Grandmother     Social History:  Social History   Socioeconomic History  . Marital status: Single    Spouse name: Not on file  . Number of children: Not on file  . Years of education: Not on file  . Highest education level: Not on file  Occupational History  . Not on file  Tobacco Use  . Smoking status: Former Smoker    Packs/day: 0.50    Types: Cigarettes    Start date: 01/28/2013  . Smokeless tobacco: Never Used  Vaping Use  . Vaping Use: Former  Substance and Sexual Activity  . Alcohol use: No    Alcohol/week: 0.0 standard drinks  . Drug use: No  . Sexual activity: Yes    Partners: Male    Birth control/protection: None  Other Topics Concern  . Not on file  Social History Narrative   ** Merged History Encounter **       Social Determinants of Health   Financial Resource Strain: Not on file  Food Insecurity: Not on file  Transportation Needs: Not on file  Physical Activity: Not on file  Stress: Not on file  Social Connections: Not on file    Allergies:  Allergies  Allergen Reactions  . Ciprofloxacin Nausea Only  . Sulfa Antibiotics Diarrhea and Nausea And Vomiting  . Buspar [Buspirone] Anxiety  . Levaquin [Levofloxacin] Anxiety    Metabolic Disorder Labs: Lab Results  Component Value Date   HGBA1C 5.4 06/05/2020   No results found for: PROLACTIN No results found for: CHOL, TRIG, HDL, CHOLHDL, VLDL, LDLCALC Lab Results  Component Value Date   TSH 28.86 (H) 07/26/2018   TSH 4.370 11/24/2016    Therapeutic Level Labs: No results found for: LITHIUM No results found for: VALPROATE No components found for:  CBMZ  Current Medications: Current Outpatient Medications  Medication Sig Dispense Refill  . albuterol (VENTOLIN HFA) 108 (90 Base) MCG/ACT inhaler Inhale 2 puffs into the lungs  every 4 (four) hours as needed for wheezing or shortness of breath. 1 each 1  . ALPRAZolam (XANAX) 1 MG tablet 0.5-1 mg daily as needed for anxiety 30 tablet 2  . clindamycin (CLEOCIN-T) 1 % external solution Apply topically 2 (two) times daily. 60 mL 0  . fexofenadine (ALLEGRA) 180 MG tablet Take 180 mg by mouth daily as needed for allergies.     . fluconazole (DIFLUCAN) 150 MG tablet One po qd prn yeast infection; may repeat in 3-4 days if needed 2 tablet 0  . levothyroxine (SYNTHROID) 125 MCG tablet Take 1 tablet by mouth daily.    Marland Kitchen terconazole (TERAZOL 7)  0.4 % vaginal cream Place 1 applicator vaginally at bedtime. X 7 nights 45 g 0  . venlafaxine XR (EFFEXOR-XR) 150 MG 24 hr capsule 225 mg daily. Take along with 75 mg cap 30 capsule 1  . venlafaxine XR (EFFEXOR-XR) 75 MG 24 hr capsule 225 mg daily. Take along with 150 mg cap 30 capsule 1   No current facility-administered medications for this visit.     Musculoskeletal: Strength & Muscle Tone: N/A Gait & Station: N/A Patient leans: N/A  Psychiatric Specialty Exam: Review of Systems  Psychiatric/Behavioral: Positive for decreased concentration and dysphoric mood. Negative for agitation, behavioral problems, confusion, hallucinations, self-injury, sleep disturbance and suicidal ideas. The patient is nervous/anxious. The patient is not hyperactive.   All other systems reviewed and are negative.   There were no vitals taken for this visit.There is no height or weight on file to calculate BMI.  General Appearance: Fairly Groomed  Eye Contact:  Good  Speech:  Clear and Coherent  Volume:  Normal  Mood:  Anxious  Affect:  Appropriate, Congruent and slightly tense  Thought Process:  Coherent  Orientation:  Full (Time, Place, and [name])  Thought Content: Logical   Suicidal Thoughts:  No  Homicidal Thoughts:  No  Memory:  Immediate;   Good  Judgement:  Good  Insight:  Fair  Psychomotor Activity:  Normal  Concentration:   Concentration: Good and Attention Span: Good  Recall:  Good  Fund of Knowledge: Good  Language: Good  Akathisia:  No  Handed:  Right  AIMS (if indicated): not done  Assets:  Communication Skills Desire for Improvement  ADL's:  Intact  Cognition: WNL  Sleep:  Good   Screenings: GAD-7   Flowsheet Row Office Visit from 03/07/2018 in [facility] Office Visit from 11/22/2017 in [facility]  Total GAD-7 Score 14 15    PHQ2-9   Flowsheet Row Office Visit from 03/07/2018 in [facility]  PHQ-2 Total Score 5  PHQ-9 Total Score 18       Assessment and Plan:  NNEKA BLANDA is a 29 y.o. year old female with a history of  depression, anxiety, ADHD, who presents for follow up appointment for below.   1. MDD (major depressive disorder), recurrent, in partial remission (HCC) 2. GAD (generalized anxiety disorder) 3. Panic disorder She continues to report symptoms of anxiety in the context of dose change in lexpthyroxin.  Other psychosocial stressors includes conflict with the father of her son/termination of rights to her son by him, taking care of her son with ADHD.  Will continue current dose of venlafaxine to target anxiety and depression.  Will consider adjunctive treatment for anxiety if any worsening in her mood symptoms.  Will continue Xanax as needed for anxiety.  She is aware of its risk of dependence and oversedation.   #ADHD(Patient has history of ADHD since age 87. She reportedly underwent neuropsychological evaluation) She reports good concentration on today's evaluation.We will continue to monitor as needed.  Plan 1.Increasevenlafaxine 225 mg daily- monitor weight gain  2.Continue Xanax 1 mg daily as needed for anxiety- 3. Next appointment 2/10 at 1 PM for 20 mins, video - on hydrocodone -sleep study in 2021- increased sleep latency, noOSA according to the patient  Past trials of medication:sertraline  ("Zombie"),citalopram, lithium, Depakote, Abilify (visual impairment),xanax, ativan, clonazepam, adderall,Ritalin, Concerta,Trazodone (hypersomnia)  The patient demonstrates the following risk factors for suicide: Chronic risk factors for suicide include:psychiatric disorder ofanxiety. Acute risk factorsfor suicide include: N/A.  Protective factorsfor this patient include: positive social support, responsibility to others (children, family), coping skills and hope for the future. Considering these factors, the overall suicide  Linda Hottereina Marilyne Haseley, MD 11/06/2020, 3:00 PM

## 2020-11-06 ENCOUNTER — Other Ambulatory Visit: Payer: Self-pay

## 2020-11-06 ENCOUNTER — Telehealth (INDEPENDENT_AMBULATORY_CARE_PROVIDER_SITE_OTHER): Payer: Medicaid Other | Admitting: Psychiatry

## 2020-11-06 ENCOUNTER — Encounter: Payer: Self-pay | Admitting: Psychiatry

## 2020-11-06 DIAGNOSIS — F3341 Major depressive disorder, recurrent, in partial remission: Secondary | ICD-10-CM | POA: Diagnosis not present

## 2020-11-06 DIAGNOSIS — F411 Generalized anxiety disorder: Secondary | ICD-10-CM | POA: Diagnosis not present

## 2020-11-06 DIAGNOSIS — F410 Panic disorder [episodic paroxysmal anxiety] without agoraphobia: Secondary | ICD-10-CM | POA: Diagnosis not present

## 2020-11-06 MED ORDER — VENLAFAXINE HCL ER 150 MG PO CP24
ORAL_CAPSULE | ORAL | 1 refills | Status: DC
Start: 2020-10-18 — End: 2021-01-07

## 2020-11-06 MED ORDER — VENLAFAXINE HCL ER 75 MG PO CP24
ORAL_CAPSULE | ORAL | 1 refills | Status: DC
Start: 2020-11-18 — End: 2021-01-07

## 2020-11-06 MED ORDER — ALPRAZOLAM 1 MG PO TABS
ORAL_TABLET | ORAL | 0 refills | Status: DC
Start: 2020-12-01 — End: 2021-04-27

## 2020-11-11 DIAGNOSIS — Z13220 Encounter for screening for lipoid disorders: Secondary | ICD-10-CM | POA: Diagnosis not present

## 2020-11-11 DIAGNOSIS — E559 Vitamin D deficiency, unspecified: Secondary | ICD-10-CM | POA: Diagnosis not present

## 2020-11-11 DIAGNOSIS — E063 Autoimmune thyroiditis: Secondary | ICD-10-CM | POA: Diagnosis not present

## 2020-11-11 DIAGNOSIS — E038 Other specified hypothyroidism: Secondary | ICD-10-CM | POA: Diagnosis not present

## 2020-11-11 DIAGNOSIS — K760 Fatty (change of) liver, not elsewhere classified: Secondary | ICD-10-CM | POA: Diagnosis not present

## 2020-11-11 DIAGNOSIS — R7303 Prediabetes: Secondary | ICD-10-CM | POA: Diagnosis not present

## 2020-11-11 DIAGNOSIS — E538 Deficiency of other specified B group vitamins: Secondary | ICD-10-CM | POA: Diagnosis not present

## 2020-11-11 DIAGNOSIS — Z124 Encounter for screening for malignant neoplasm of cervix: Secondary | ICD-10-CM | POA: Diagnosis not present

## 2020-12-22 NOTE — Progress Notes (Signed)
Virtual Visit via Video Note  I connected with Linda [name] on 12/25/20 at  1:00 PM EST by a video enabled telemedicine application and verified that I am speaking with the correct [name] using two identifiers.  Location: Patient: home Provider: office   I discussed the limitations of evaluation and management by telemedicine and the availability of in [name] appointments. The patient expressed understanding and agreed to proceed.   I discussed the assessment and treatment plan with the patient. The patient was provided an opportunity to ask questions and all were answered. The patient agreed with the plan and demonstrated an understanding of the instructions.   The patient was advised to call back or seek an in-[name] evaluation if the symptoms worsen or if the condition fails to improve as anticipated.  I provided 13 minutes of non-face-to-face time during this encounter.   Neysa Hotter, MD    [facility] MD/PA/NP OP Progress Note  12/25/2020 1:25 PM GER NICKS  MRN:  [id number]  Chief Complaint:  Chief Complaint    Follow-up; Depression; Anxiety     HPI:  This is a follow-up appointment for depression and anxiety. She states that she has been doing good.  She has not noticed much difference since up titration of anxiety.  However, it has not been so overwhelming compared to before.  She enjoys meeting with her close family friends healthy food weekends.  She enjoys watching basketball game.  She enjoys being with her son.  She states that her anxiety is stemming from her son, stating that he was born premature.  It was difficult time for her not being able to be with her son for a few months after he was born.  She was wants to make sure to keep him safe. She states that she has "separation anxiety."  He will likely have meds change in ADHD.  She is willing to see a therapist.  She has initial and middle insomnia.  She has fair energy.  She denies feeling depressed.  She has  started healthy diet, and is planning to do exercise with her parents.  She has occasional panic attacks.  She drinks only on special occasion.  She denies drug use.    271 lbs Wt Readings from Last 3 Encounters:  07/10/20 283 lb 3.2 oz (128.5 kg)  06/05/20 (!) 284 lb (128.8 kg)  12/30/19 280 lb (127 kg)    Employment:works for insurance company, used to work as Social worker Support: Household:  Marital status:married in 8/201. Divorced once.  Number of children:1 son She grew up in [place].She reports great relationship with her parents.  Visit Diagnosis:    ICD-10-CM   1. GAD (generalized anxiety disorder)  F41.1   2. MDD (major depressive disorder), recurrent, in partial remission (HCC)  F33.41   3. Panic disorder  F41.0     Past Psychiatric History: Please see initial evaluation for full details. I have reviewed the history. No updates at this time.     Past Medical History:  Past Medical History:  Diagnosis Date  . Anxiety   . Asthma   . Heart murmur   . Hypothyroidism   . IBS (irritable bowel syndrome)   . Interstitial cystitis     Past Surgical History:  Procedure Laterality Date  . APPENDECTOMY    . CESAREAN SECTION N/A 03/20/2013   Procedure: CESAREAN SECTION;  Surgeon: [name], MD;  Location: [facility];  Service: Obstetrics;  Laterality: N/A;  . CESAREAN SECTION    .  LAPAROSCOPIC APPENDECTOMY N/A 10/16/2019   Procedure: APPENDECTOMY LAPAROSCOPIC;  Surgeon: Franky Macho, MD;  Location: [facility];  Service: General;  Laterality: N/A;  . MYRINGOTOMY WITH TUBE PLACEMENT    . NO PAST SURGERIES    . TYMPANOSTOMY TUBE PLACEMENT    . UPPER GI ENDOSCOPY      Family Psychiatric History: Please see initial evaluation for full details. I have reviewed the history. No updates at this time.     Family History:  Family History  Problem Relation Age of Onset  . Cancer Paternal Uncle   . COPD Maternal Grandfather   . Stroke Paternal Grandfather   .  Depression Mother   . OCD Mother   . Bipolar disorder Paternal Aunt   . Drug abuse Maternal Uncle   . Depression Maternal Grandmother   . Anxiety disorder Maternal Grandmother     Social History:  Social History   Socioeconomic History  . Marital status: Single    Spouse name: Not on file  . Number of children: Not on file  . Years of education: Not on file  . Highest education level: Not on file  Occupational History  . Not on file  Tobacco Use  . Smoking status: Former Smoker    Packs/day: 0.50    Types: Cigarettes    Start date: 01/28/2013  . Smokeless tobacco: Never Used  Vaping Use  . Vaping Use: Former  Substance and Sexual Activity  . Alcohol use: No    Alcohol/week: 0.0 standard drinks  . Drug use: No  . Sexual activity: Yes    Partners: Male    Birth control/protection: None  Other Topics Concern  . Not on file  Social History Narrative   ** Merged History Encounter **       Social Determinants of Health   Financial Resource Strain: Not on file  Food Insecurity: Not on file  Transportation Needs: Not on file  Physical Activity: Not on file  Stress: Not on file  Social Connections: Not on file    Allergies:  Allergies  Allergen Reactions  . Ciprofloxacin Nausea Only  . Sulfa Antibiotics Diarrhea and Nausea And Vomiting  . Buspar [Buspirone] Anxiety  . Levaquin [Levofloxacin] Anxiety    Metabolic Disorder Labs: Lab Results  Component Value Date   HGBA1C 5.4 06/05/2020   No results found for: PROLACTIN No results found for: CHOL, TRIG, HDL, CHOLHDL, VLDL, LDLCALC Lab Results  Component Value Date   TSH 28.86 (H) 07/26/2018   TSH 4.370 11/24/2016    Therapeutic Level Labs: No results found for: LITHIUM No results found for: VALPROATE No components found for:  CBMZ  Current Medications: Current Outpatient Medications  Medication Sig Dispense Refill  . albuterol (VENTOLIN HFA) 108 (90 Base) MCG/ACT inhaler Inhale 2 puffs into the lungs  every 4 (four) hours as needed for wheezing or shortness of breath. 1 each 1  . ALPRAZolam (XANAX) 1 MG tablet 0.5-1 mg daily as needed for anxiety 30 tablet 0  . clindamycin (CLEOCIN-T) 1 % external solution Apply topically 2 (two) times daily. 60 mL 0  . fexofenadine (ALLEGRA) 180 MG tablet Take 180 mg by mouth daily as needed for allergies.     . fluconazole (DIFLUCAN) 150 MG tablet One po qd prn yeast infection; may repeat in 3-4 days if needed 2 tablet 0  . levothyroxine (SYNTHROID) 125 MCG tablet Take 1 tablet by mouth daily.    Marland Kitchen terconazole (TERAZOL 7) 0.4 % vaginal cream Place 1  applicator vaginally at bedtime. X 7 nights 45 g 0  . venlafaxine XR (EFFEXOR-XR) 150 MG 24 hr capsule 225 mg daily. Take along with 75 mg cap 30 capsule 1  . venlafaxine XR (EFFEXOR-XR) 75 MG 24 hr capsule 225 mg daily. Take along with 150 mg cap 30 capsule 1   No current facility-administered medications for this visit.     Musculoskeletal: Strength & Muscle Tone: N/A Gait & Station: N/A Patient leans: N/A  Psychiatric Specialty Exam: Review of Systems  Psychiatric/Behavioral: Positive for sleep disturbance. Negative for agitation, behavioral problems, confusion, decreased concentration, dysphoric mood, hallucinations, self-injury and suicidal ideas. The patient is nervous/anxious. The patient is not hyperactive.   All other systems reviewed and are negative.   There were no vitals taken for this visit.There is no height or weight on file to calculate BMI.  General Appearance: Fairly Groomed  Eye Contact:  Good  Speech:  Clear and Coherent  Volume:  Normal  Mood:  Anxious  Affect:  Appropriate, Congruent and euthymic  Thought Process:  Coherent  Orientation:  Full (Time, Place, and [name])  Thought Content: Logical   Suicidal Thoughts:  No  Homicidal Thoughts:  No  Memory:  Immediate;   Good  Judgement:  Good  Insight:  Good  Psychomotor Activity:  Normal  Concentration:  Concentration: Good  and Attention Span: Good  Recall:  Good  Fund of Knowledge: Good  Language: Good  Akathisia:  No  Handed:  Right  AIMS (if indicated): not done  Assets:  Communication Skills Desire for Improvement  ADL's:  Intact  Cognition: WNL  Sleep:  Poor   Screenings: GAD-7   Flowsheet Row Office Visit from 03/07/2018 in [facility] Office Visit from 11/22/2017 in [facility]  Total GAD-7 Score 14 15    PHQ2-9   Flowsheet Row Office Visit from 03/07/2018 in [facility]  PHQ-2 Total Score 5  PHQ-9 Total Score 18       Assessment and Plan:  KEILAH LEMIRE is a 30 y.o. year old female with a history of  depression, anxiety, ADHD, prediabetes, thyroid disease,  who presents for follow up appointment for below.   1. MDD (major depressive disorder), recurrent, in partial remission (HCC) 2. GAD (generalized anxiety disorder) 3. Panic disorder She continues to report anxiety and panic attacks , although there has been overall improvement in depressive symptoms since up titration of venlafaxine.  Psychosocial stressors includes her son with ADHD.  Although she will benefit from adjunctive treatment for anxiety, she prefers to stay on the current medication regimen and is interested in starting therapy.  Will continue venlafaxine to target anxiety.  Will continue Xanax as needed for anxiety.  She is aware of its risk of dependence and oversedation.   #ADHD(Patient has history of ADHD since age 65. She reportedly underwent neuropsychological evaluation) She reports good concentration on today's evaluation.We will continue to monitor as needed.  Plan 1 Continue venlafaxine225mg  daily- monitor weight gain  2.Continue Xanax 1 mg daily as needed for anxiety-refill left 3. Next appointment 3/14 at 1:20  for 20 mins, video 4. Referral to therapy - on hydrocodone, melatonin -sleep study in 2021- increased sleep latency, noOSA according to the  patient  Last seen by PCP in 10/2020. fT4 wnl, LDL 119, HbA1c 6.2% on 10/2020  Past trials of medication:sertraline ("Zombie"),citalopram, lithium, Depakote, Abilify (visual impairment),xanax, ativan, clonazepam, adderall,Ritalin, Concerta,Trazodone (hypersomnia)  The patient demonstrates the following risk factors for suicide: Chronic  risk factors for suicide include:psychiatric disorder ofanxiety. Acute risk factorsfor suicide include: N/A. Protective factorsfor this patient include: positive social support, responsibility to others (children, family), coping skills and hope for the future. Considering these factors, the overall suicide  Neysa Hotter, MD 12/25/2020, 1:25 PM

## 2020-12-25 ENCOUNTER — Telehealth (INDEPENDENT_AMBULATORY_CARE_PROVIDER_SITE_OTHER): Payer: Medicaid Other | Admitting: Psychiatry

## 2020-12-25 ENCOUNTER — Other Ambulatory Visit: Payer: Self-pay

## 2020-12-25 ENCOUNTER — Encounter: Payer: Self-pay | Admitting: Psychiatry

## 2020-12-25 DIAGNOSIS — F3341 Major depressive disorder, recurrent, in partial remission: Secondary | ICD-10-CM

## 2020-12-25 DIAGNOSIS — F410 Panic disorder [episodic paroxysmal anxiety] without agoraphobia: Secondary | ICD-10-CM

## 2020-12-25 DIAGNOSIS — F411 Generalized anxiety disorder: Secondary | ICD-10-CM

## 2021-01-05 ENCOUNTER — Encounter (HOSPITAL_COMMUNITY): Payer: Self-pay | Admitting: Psychiatry

## 2021-01-05 ENCOUNTER — Ambulatory Visit (INDEPENDENT_AMBULATORY_CARE_PROVIDER_SITE_OTHER): Payer: Medicaid Other | Admitting: Psychiatry

## 2021-01-05 ENCOUNTER — Other Ambulatory Visit: Payer: Self-pay

## 2021-01-05 DIAGNOSIS — F411 Generalized anxiety disorder: Secondary | ICD-10-CM
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
## 2021-01-07 ENCOUNTER — Ambulatory Visit
Admission: RE | Admit: 2021-01-07 | Discharge: 2021-01-07 | Disposition: A | Discharge status: Home / Self Care | Payer: Medicaid Other | Source: Ambulatory Visit | Attending: Physician Assistant | Admitting: Physician Assistant

## 2021-01-07 ENCOUNTER — Ambulatory Visit (INDEPENDENT_AMBULATORY_CARE_PROVIDER_SITE_OTHER): Payer: Medicaid Other

## 2021-01-07 ENCOUNTER — Other Ambulatory Visit: Payer: Self-pay

## 2021-01-07 VITALS — BP 120/78 | HR 80 | Temp 97.8°F | Resp 19

## 2021-01-07 DIAGNOSIS — R079 Chest pain, unspecified: Secondary | ICD-10-CM | POA: Diagnosis not present

## 2021-01-07 DIAGNOSIS — F17210 Nicotine dependence, cigarettes, uncomplicated: Secondary | ICD-10-CM | POA: Diagnosis not present

## 2021-01-07 DIAGNOSIS — R0789 Other chest pain: Secondary | ICD-10-CM

## 2021-01-07 DIAGNOSIS — E039 Hypothyroidism, unspecified: Secondary | ICD-10-CM | POA: Diagnosis not present

## 2021-01-07 DIAGNOSIS — Z79899 Other long term (current) drug therapy: Secondary | ICD-10-CM | POA: Insufficient documentation

## 2021-01-07 DIAGNOSIS — J45909 Unspecified asthma, uncomplicated: Secondary | ICD-10-CM | POA: Diagnosis not present

## 2021-01-07 DIAGNOSIS — R0602 Shortness of breath: Secondary | ICD-10-CM | POA: Diagnosis not present

## 2021-01-07 DIAGNOSIS — R61 Generalized hyperhidrosis: Secondary | ICD-10-CM | POA: Insufficient documentation

## 2021-01-07 DIAGNOSIS — R0600 Dyspnea, unspecified: Secondary | ICD-10-CM | POA: Insufficient documentation

## 2021-01-07 MED ORDER — DICLOFENAC SODIUM 75 MG PO TBEC: 75.0000 mg | DELAYED_RELEASE_TABLET | Freq: Two times a day (BID) | ORAL | Status: DC

## 2021-01-07 NOTE — Discharge Instructions (Signed)
Go to the Emergency department if symptoms worsen or change  

## 2021-01-07 NOTE — ED Triage Notes (Signed)
Shortness of breath and CP that feels like grabbing to LT chest that radiates to top of LT back x 2-3 days.  Pt states 3 years ago she had an infection around her heart and this feels sort of like that did.

## 2021-01-08 ENCOUNTER — Emergency Department (HOSPITAL_COMMUNITY)
Admission: EM | Admit: 2021-01-08 | Discharge: 2021-01-08 | Disposition: A | Discharge status: Home / Self Care | Payer: Medicaid Other | Attending: Emergency Medicine | Admitting: Emergency Medicine

## 2021-01-08 ENCOUNTER — Encounter (HOSPITAL_COMMUNITY): Payer: Self-pay | Admitting: Emergency Medicine

## 2021-01-08 ENCOUNTER — Other Ambulatory Visit: Payer: Self-pay

## 2021-01-08 DIAGNOSIS — R079 Chest pain, unspecified: Secondary | ICD-10-CM

## 2021-01-08 LAB — CBC
HCT: 40.7 % (ref 36.0–46.0)
Hemoglobin: 12.7 g/dL (ref 12.0–15.0)
MCH: 28 pg (ref 26.0–34.0)
MCHC: 31.2 g/dL (ref 30.0–36.0)
MCV: 89.6 fL (ref 80.0–100.0)
Platelets: 341 10*3/uL (ref 150–400)
RBC: 4.54 MIL/uL (ref 3.87–5.11)
RDW: 12.6 % (ref 11.5–15.5)
WBC: 11.2 10*3/uL — ABNORMAL HIGH (ref 4.0–10.5)
nRBC: 0 % (ref 0.0–0.2)

## 2021-01-08 LAB — BASIC METABOLIC PANEL
Anion gap: 9 (ref 5–15)
BUN: 9 mg/dL (ref 6–20)
CO2: 25 mmol/L (ref 22–32)
Calcium: 9.1 mg/dL (ref 8.9–10.3)
Chloride: 103 mmol/L (ref 98–111)
Creatinine, Ser: 0.69 mg/dL (ref 0.44–1.00)
GFR, Estimated: >60 mL/min (ref >60)
Glucose, Bld: 99 mg/dL (ref 70–99)
Potassium: 3.6 mmol/L (ref 3.5–5.1)
Sodium: 137 mmol/L (ref 135–145)

## 2021-01-08 LAB — TROPONIN I (HIGH SENSITIVITY)
Troponin I (High Sensitivity): <2 ng/L (ref <18)
Troponin I (High Sensitivity): <2 ng/L (ref <18)

## 2021-01-08 LAB — SEDIMENTATION RATE: Sed Rate: 45 mm/hr — ABNORMAL HIGH (ref 0–22)

## 2021-01-08 LAB — TSH: TSH: 0.957 u[IU]/mL (ref 0.350–4.500)

## 2021-01-08 LAB — POC URINE PREG, ED: Preg Test, Ur: NEGATIVE

## 2021-01-08 MED ORDER — PREDNISONE 50 MG PO TABS: 50.0000 mg | ORAL_TABLET | Freq: Every day | ORAL | 0 refills | Status: DC

## 2021-01-08 MED ORDER — PREDNISONE 50 MG PO TABS
60.0000 mg | ORAL_TABLET | Freq: Once | ORAL | Status: AC
  Administered 2021-01-08: 60 mg via ORAL
  Filled 2021-01-08: qty 1

## 2021-01-08 NOTE — ED Notes (Signed)
Pt given a beverage. 

## 2021-01-08 NOTE — Discharge Instructions (Signed)
Take ibuprofen or naproxen as needed for pain.  Follow up with the cardiologist.  Return to the Emergency Department if symptoms are getting worse.

## 2021-01-08 NOTE — ED Triage Notes (Signed)
Pt c/o chest pain for a few days and went to urgent care today for the same.

## 2021-01-08 NOTE — ED Provider Notes (Signed)
[facility]    CSN: [id number] Arrival date & time: 01/07/21  1050      History   Chief Complaint Chief Complaint  Patient presents with  . Shortness of Breath    HPI Linda [name] is a 30 y.o. female.   The history is provided by the patient. No language interpreter was used.  Shortness of Breath Severity:  Moderate Onset quality:  Gradual Timing:  Constant Progression:  Worsening Chronicity:  New Relieved by:  Nothing Worsened by:  Nothing Associated symptoms: no cough   Risk factors: no hx of PE/DVT   Pt complains of chest pain Pt reports similar in the past.   Past Medical History:  Diagnosis Date  . Anxiety   . Asthma   . Heart murmur   . Hypothyroidism   . IBS (irritable bowel syndrome)   . Interstitial cystitis     Patient Active Problem List   Diagnosis Date Noted  . Hidradenitis suppurativa of left axilla 07/10/2020  . Hepatomegaly 03/16/2020  . Hepatic steatosis 03/16/2020  . S/P laparoscopic appendectomy 10/16/2019  . Acute appendicitis, uncomplicated   . Ovarian cyst, right   . Mood disorder in conditions classified elsewhere 04/06/2018  . Generalized anxiety disorder 04/06/2018  . Hypothyroidism 06/21/2014  . Interstitial cystitis 06/14/2014  . 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 Morbid obesity (HCC) 02/07/2014  . Asthma, chronic 04/15/2013  . Chronic anxiety 04/15/2013  . Cervical strain 03/07/2012  . Sprain and strain of unspecified site of shoulder and upper arm 03/07/2012  . Pain in joint, shoulder region 03/07/2012    Past Surgical History:  Procedure Laterality Date  . APPENDECTOMY    . CESAREAN SECTION N/A 03/20/2013   Procedure: CESAREAN SECTION;  Surgeon: [name], MD;  Location: [facility];  Service: Obstetrics;  Laterality: N/A;  . CESAREAN SECTION    . LAPAROSCOPIC APPENDECTOMY N/A 10/16/2019   Procedure: APPENDECTOMY LAPAROSCOPIC;  Surgeon: Franky Macho, MD;  Location:  [facility];  Service: General;  Laterality: N/A;  . MYRINGOTOMY WITH TUBE PLACEMENT    . NO PAST SURGERIES    . TYMPANOSTOMY TUBE PLACEMENT    . UPPER GI ENDOSCOPY      OB History    Gravida  1   Para  1   Term  0   Preterm  1   AB  0   Living  1     SAB  0   IAB  0   Ectopic  0   Multiple      Live Births  1            Home Medications    Prior to Admission medications   Medication Sig Start Date End Date Taking? Authorizing Provider  albuterol (VENTOLIN HFA) 108 (90 Base) MCG/ACT inhaler Inhale 2 puffs into the lungs every 4 (four) hours as needed for wheezing or shortness of breath. 10/31/20   Campbell Riches, NP  ALPRAZolam Prudy Feeler) 1 MG tablet 0.5-1 mg daily as needed for anxiety 12/01/20   Neysa Hotter, MD  fexofenadine (ALLEGRA) 180 MG tablet Take 180 mg by mouth daily as needed for allergies.     [provider]  predniSONE (DELTASONE) 50 MG tablet Take 1 tablet (50 mg total) by mouth daily. 01/08/21   Dione Booze, MD  levothyroxine (SYNTHROID) 125 MCG tablet Take 1 tablet by mouth daily.  01/07/21  [provider]  venlafaxine XR (EFFEXOR-XR) 75 MG 24 hr capsule 225  mg daily. Take along with 150 mg cap 11/18/20 01/07/21  Neysa Hotter, MD    Family History Family History  Problem Relation Age of Onset  . Cancer Paternal Uncle   . COPD Maternal Grandfather   . Stroke Paternal Grandfather   . Depression Mother   . OCD Mother   . Anxiety disorder Mother   . Drug abuse Maternal Uncle   . Depression Maternal Uncle   . Anxiety disorder Maternal Uncle   . Depression Maternal Grandmother   . Anxiety disorder Brother   . Dementia Paternal Grandmother     Social History Social History   Tobacco Use  . Smoking status: Current Every Day Smoker    Packs/day: 1.00    Types: Cigarettes    Start date: 01/28/2013  . Smokeless tobacco: Never Used  Vaping Use  . Vaping Use: Former  Substance Use Topics  . Alcohol use: No    Alcohol/week:  0.0 standard drinks  . Drug use: No     Allergies   Ciprofloxacin, Sulfa antibiotics, Buspar [buspirone], and Levaquin [levofloxacin]   Review of Systems Review of Systems  Respiratory: Positive for shortness of breath. Negative for cough.   All other systems reviewed and are negative.    Physical Exam Triage Vital Signs ED Triage Vitals  Enc Vitals Group     BP 01/07/21 1109 120/78     Pulse Rate 01/07/21 1109 80     Resp 01/07/21 1109 19     Temp 01/07/21 1109 97.8 F (36.6 C)     Temp Source 01/07/21 1109 Oral     SpO2 01/07/21 1109 95 %     Weight --      Height --      Head Circumference --      Peak Flow --      Pain Score 01/07/21 1107 7     Pain Loc --      Pain Edu? --      Excl. in GC? --    No data found.  Updated Vital Signs BP 120/78 (BP Location: Right Arm)   Pulse 80   Temp 97.8 F (36.6 C) (Oral)   Resp 19   SpO2 95%   Visual Acuity Right Eye Distance:   Left Eye Distance:   Bilateral Distance:    Right Eye Near:   Left Eye Near:    Bilateral Near:     Physical Exam Vitals and nursing note reviewed.  Constitutional:      Appearance: She is well-developed and well-nourished.  HENT:     Head: Normocephalic.  Eyes:     Extraocular Movements: EOM normal.  Cardiovascular:     Rate and Rhythm: Normal rate and regular rhythm.  Pulmonary:     Effort: Pulmonary effort is normal.     Breath sounds: No decreased breath sounds.  Chest:     Chest wall: Tenderness present.  Abdominal:     General: There is no distension.  Musculoskeletal:        General: Normal range of motion.     Cervical back: Normal range of motion.  Neurological:     Mental Status: She is alert and oriented to person, place, and time.  Psychiatric:        Mood and Affect: Mood and affect and mood normal.      UC Treatments / Results  Labs (all labs ordered are listed, but only abnormal results are displayed) Labs Reviewed - No data to display  EKGNormal  sinus  Normal EKG   Radiology DG Chest 2 View  Result Date: 01/07/2021 CLINICAL DATA:  Shortness of breath and chest pain EXAM: CHEST - 2 VIEW COMPARISON:  12/30/2019 FINDINGS: The heart size and mediastinal contours are within normal limits. Both lungs are clear. The visualized skeletal structures are unremarkable. IMPRESSION: No active cardiopulmonary disease. Electronically Signed   By: Judie Petit.  Shick M.D.   On: 01/07/2021 11:59    Procedures Procedures (including critical care time)  Medications Ordered in UC Medications  diclofenac (VOLTAREN) EC tablet 75 mg (has no administration in time range)    Initial Impression / Assessment and Plan / UC Course  I have reviewed the triage vital signs and the nursing notes.  Pertinent labs & imaging results that were available during my care of the patient were reviewed by me and considered in my medical decision making (see chart for details).     MDM:  I doubt cardiac or pulmonary etiology.  Pt given rx for mobic   Final Clinical Impressions(s) / UC Diagnoses   Final diagnoses:  Chest wall pain     Discharge Instructions     Go to the Emergency department if symptoms worsen or change   ED Prescriptions    None     PDMP not reviewed this encounter.  An After Visit Summary was printed and given to the patient.   Elson Areas, [place] 01/08/21 [phone]

## 2021-01-08 NOTE — ED Provider Notes (Signed)
[facility] EMERGENCY DEPARTMENT Provider Note   CSN: [id number] Arrival date & time: 01/07/21  2358   History Chief Complaint  Patient presents with  . Chest Pain    Linda [name] is a 30 y.o. female.  The history is provided by the patient.  Chest Pain She has history of anxiety, asthma, irritable bowel syndrome, interstitial cystitis and comes in complaining of intermittent left-sided chest pain for the last 3 days.  She describes pain as like something is squeezing her heart.  Pain will be present for as long as 45 minutes before resolving.  None present, it is worse if she lays on her left or right side.  Nothing seems to make it better.  There is some associated dyspnea, nausea, diaphoresis.  She has had a cough productive of some clear sputum.  She denies fever or chills.  Ibuprofen does not seem to help.  Pain is similar to what she had with an episode of pericarditis 3 years ago.  She denies history of recent travel, surgery.  She is not using oral contraceptives or any other exogenous estrogens.  There is no history of DVT or pulmonary embolism.  Past Medical History:  Diagnosis Date  . Anxiety   . Asthma   . Heart murmur   . Hypothyroidism   . IBS (irritable bowel syndrome)   . Interstitial cystitis     Patient Active Problem List   Diagnosis Date Noted  . Hidradenitis suppurativa of left axilla 07/10/2020  . Hepatomegaly 03/16/2020  . Hepatic steatosis 03/16/2020  . S/P laparoscopic appendectomy 10/16/2019  . Acute appendicitis, uncomplicated   . Ovarian cyst, right   . Mood disorder in conditions classified elsewhere 04/06/2018  . Generalized anxiety disorder 04/06/2018  . Hypothyroidism 06/21/2014  . Interstitial cystitis 06/14/2014  . 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 Morbid obesity (HCC) 02/07/2014  . Asthma, chronic 04/15/2013  . Chronic anxiety 04/15/2013  . Cervical strain 03/07/2012  . Sprain and strain of  unspecified site of shoulder and upper arm 03/07/2012  . Pain in joint, shoulder region 03/07/2012    Past Surgical History:  Procedure Laterality Date  . APPENDECTOMY    . CESAREAN SECTION N/A 03/20/2013   Procedure: CESAREAN SECTION;  Surgeon: [name], MD;  Location: [facility];  Service: Obstetrics;  Laterality: N/A;  . CESAREAN SECTION    . LAPAROSCOPIC APPENDECTOMY N/A 10/16/2019   Procedure: APPENDECTOMY LAPAROSCOPIC;  Surgeon: Franky Macho, MD;  Location: [facility];  Service: General;  Laterality: N/A;  . MYRINGOTOMY WITH TUBE PLACEMENT    . NO PAST SURGERIES    . TYMPANOSTOMY TUBE PLACEMENT    . UPPER GI ENDOSCOPY       OB History    Gravida  1   Para  1   Term  0   Preterm  1   AB  0   Living  1     SAB  0   IAB  0   Ectopic  0   Multiple      Live Births  1           Family History  Problem Relation Age of Onset  . Cancer Paternal Uncle   . COPD Maternal Grandfather   . Stroke Paternal Grandfather   . Depression Mother   . OCD Mother   . Anxiety disorder Mother   . Drug abuse Maternal Uncle   . Depression Maternal Uncle   . Anxiety disorder Maternal  Uncle   . Depression Maternal Grandmother   . Anxiety disorder Brother   . Dementia Paternal Grandmother     Social History   Tobacco Use  . Smoking status: Current Every Day Smoker    Packs/day: 1.00    Types: Cigarettes    Start date: 01/28/2013  . Smokeless tobacco: Never Used  Vaping Use  . Vaping Use: Former  Substance Use Topics  . Alcohol use: No    Alcohol/week: 0.0 standard drinks  . Drug use: No    Home Medications Prior to Admission medications   Medication Sig Start Date End Date Taking? Authorizing Provider  albuterol (VENTOLIN HFA) 108 (90 Base) MCG/ACT inhaler Inhale 2 puffs into the lungs every 4 (four) hours as needed for wheezing or shortness of breath. 10/31/20   Campbell Riches, NP  ALPRAZolam Prudy Feeler) 1 MG tablet 0.5-1 mg daily as needed for anxiety  12/01/20   Neysa Hotter, MD  fexofenadine (ALLEGRA) 180 MG tablet Take 180 mg by mouth daily as needed for allergies.     [provider]  levothyroxine (SYNTHROID) 125 MCG tablet Take 1 tablet by mouth daily.  01/07/21  [provider]  venlafaxine XR (EFFEXOR-XR) 75 MG 24 hr capsule 225 mg daily. Take along with 150 mg cap 11/18/20 01/07/21  Neysa Hotter, MD    Allergies    Ciprofloxacin, Sulfa antibiotics, Buspar [buspirone], and Levaquin [levofloxacin]  Review of Systems   Review of Systems  Cardiovascular: Positive for chest pain.  All other systems reviewed and are negative.   Physical Exam Updated Vital Signs BP 125/74   Pulse 76   Temp 98 F (36.7 C)   Resp 19   Ht 5\' 5"  (1.651 m)   Wt 104.3 kg   LMP 11/01/2020   SpO2 99%   BMI 38.27 kg/m   Physical Exam Vitals and nursing note reviewed.   30 year old female, resting comfortably and in no acute distress. Vital signs are normal. Oxygen saturation is 99%, which is normal. Head is normocephalic and atraumatic. PERRLA, EOMI. Oropharynx is clear. Neck is nontender and supple without adenopathy or JVD. Back is nontender and there is no CVA tenderness. Lungs are clear without rales, wheezes, or rhonchi. Chest is moderately tender in the parasternal area bilaterally, but worse on the left.  This is different from the pain she has been complaining of. Heart has regular rate and rhythm without murmur. Abdomen is soft, flat, nontender without masses or hepatosplenomegaly and peristalsis is normoactive. Extremities have no cyanosis or edema, full range of motion is present. Skin is warm and dry without rash. Neurologic: Mental status is normal, cranial nerves are intact, there are no motor or sensory deficits.  ED Results / Procedures / Treatments   Labs (all labs ordered are listed, but only abnormal results are displayed) Labs Reviewed  CBC - Abnormal; Notable for the following components:      Result  Value   WBC 11.2 (*)    All other components within normal limits  BASIC METABOLIC PANEL  POC URINE PREG, ED  TROPONIN I (HIGH SENSITIVITY)  TROPONIN I (HIGH SENSITIVITY)    EKG EKG Interpretation  Date/Time:  Thursday January 08 2021 00:12:57 EST Ventricular Rate:  76 PR Interval:  144 QRS Duration: 82 QT Interval:  378 QTC Calculation: 425 R Axis:   47 Text Interpretation: Normal sinus rhythm Normal ECG When compared with ECG of 12/30/2019, Nonspecific T wave abnormality is no longer present Confirmed by 01/01/2020,  Onalee Hua ([id number]) on 01/08/2021 12:21:44 AM   Radiology DG Chest 2 View  Result Date: 01/07/2021 CLINICAL DATA:  Shortness of breath and chest pain EXAM: CHEST - 2 VIEW COMPARISON:  12/30/2019 FINDINGS: The heart size and mediastinal contours are within normal limits. Both lungs are clear. The visualized skeletal structures are unremarkable. IMPRESSION: No active cardiopulmonary disease. Electronically Signed   By: Judie Petit.  Shick M.D.   On: 01/07/2021 11:59    Procedures Procedures   Medications Ordered in ED Medications  predniSONE (DELTASONE) tablet 60 mg (has no administration in time range)    ED Course  I have reviewed the triage vital signs and the nursing notes.  Pertinent labs & imaging results that were available during my care of the patient were reviewed by me and considered in my medical decision making (see chart for details).  MDM Rules/Calculators/A&P Chest pain of uncertain cause.  ECG is normal and troponin is normal, ACS very unlikely.  Chest x-ray obtained at an urgent care visit yesterday was normal, no evidence for pneumonia.  She is negative for pulmonary embolism by both PERC rule and Wells criteria.  Old records reviewed showing urgent care visit yesterday with diagnosis of chest wall pain.  I cannot find documentation of her diagnosis of pericarditis.  No echocardiogram is on file.  Will check sedimentation rate.  Since labs are being drawn, patient  is requesting her thyroid levels be checked.  TSH is ordered.  Repeat troponin is normal.  Sedimentation rate is indeterminate at 45.  She will be given a short course of prednisone, initial dose given in the emergency department.  She is referred to cardiology for further outpatient evaluation, consideration for echocardiogram.  Return precautions discussed.  Final Clinical Impression(s) / ED Diagnoses Final diagnoses:  Nonspecific chest pain    Rx / DC Orders ED Discharge Orders         Ordered    predniSONE (DELTASONE) 50 MG tablet  Daily        01/08/21 0459    Ambulatory referral to Cardiology        01/08/21 0459           Dione Booze, MD 01/08/21 [phone]

## 2021-01-09 ENCOUNTER — Telehealth: Payer: Self-pay

## 2021-01-09 DIAGNOSIS — R7303 Prediabetes: Secondary | ICD-10-CM | POA: Diagnosis not present

## 2021-01-09 DIAGNOSIS — E063 Autoimmune thyroiditis: Secondary | ICD-10-CM | POA: Diagnosis not present

## 2021-01-09 DIAGNOSIS — E038 Other specified hypothyroidism: Secondary | ICD-10-CM | POA: Diagnosis not present

## 2021-01-09 NOTE — Telephone Encounter (Signed)
Transition Care Management Unsuccessful Follow-up Telephone Call  Date of discharge and from where:  01/08/2021 from [place]  Attempts:  1st Attempt  Reason for unsuccessful TCM follow-up call:  Voice mail full     

## 2021-01-12 NOTE — Telephone Encounter (Signed)
Transition Care Management Unsuccessful Follow-up Telephone Call  Date of discharge and from where:  01/08/2021 from [facility].   Attempts:  2nd Attempt  Reason for unsuccessful TCM follow-up call:  Left voice message

## 2021-01-12 NOTE — Progress Notes (Signed)
Virtual Visit via Video Note  I connected with Linda [name] on 01/26/21 at  1:20 PM EDT by a video enabled telemedicine application and verified that I am speaking with the correct [name] using two identifiers.  Location: Patient: home Provider: office Persons participated in the visit- patient, provider   I discussed the limitations of evaluation and management by telemedicine and the availability of in [name] appointments. The patient expressed understanding and agreed to proceed.   I discussed the assessment and treatment plan with the patient. The patient was provided an opportunity to ask questions and all were answered. The patient agreed with the plan and demonstrated an understanding of the instructions.   The patient was advised to call back or seek an in-[name] evaluation if the symptoms worsen or if the condition fails to improve as anticipated.  I provided 15 minutes of non-face-to-face time during this encounter.   Neysa Hotter, MD     [facility] MD/PA/NP OP Progress Note  01/26/2021 1:52 PM Linda [name]  MRN:  [id number]  Chief Complaint:  Chief Complaint    Follow-up; Depression     HPI:  This is a follow-up appointment for depression and anxiety.  She states that her son will have an evaluation by behavioral health specialist.  He does not listen to authority, and has started to disrupt classes at school.  She has been able to be attentive to him, although she feels frustrated with the situation.  Her husband has been very supportive of this.  She is considering bariatric surgery.  Although she had tried meal plans and apps to lose weight, there has been no change in her weight.  Although she was provided information about potential risk of venlafaxine causing weight gain, she prefers to stay on the medication as it has been working for her mood without any other side effect.  She feels anxious at times, having "obsessive thoughts "about her future, her son and her  parents health.  The last time she took Xanax was last week.  She denies alcohol use or drug use.  She has fair sleep at night. She denies feeling depressed.  She denies SI. She denies panic attacks.   Employment:works for [employer], used to work as Social worker Support: Household: husband, son Marital status:married in 8/201. Divorced once.  Number of children:1 son She grew up in [place].She reports great relationship with her parents.  Wt Readings from Last 3 Encounters:  01/08/21 230 lb (104.3 kg)  07/10/20 283 lb 3.2 oz (128.5 kg)  06/05/20 (!) 284 lb (128.8 kg)    Visit Diagnosis:    ICD-10-CM   1. GAD (generalized anxiety disorder)  F41.1   2. MDD (major depressive disorder), recurrent, in partial remission (HCC)  F33.41   3. Panic disorder  F41.0     Past Psychiatric History: Please see initial evaluation for full details. I have reviewed the history. No updates at this time.     Past Medical History:  Past Medical History:  Diagnosis Date  . Anxiety   . Asthma   . Heart murmur   . Hypothyroidism   . IBS (irritable bowel syndrome)   . Interstitial cystitis     Past Surgical History:  Procedure Laterality Date  . APPENDECTOMY    . CESAREAN SECTION N/A 03/20/2013   Procedure: CESAREAN SECTION;  Surgeon: [name], MD;  Location: [facility];  Service: Obstetrics;  Laterality: N/A;  . CESAREAN SECTION    . LAPAROSCOPIC APPENDECTOMY  N/A 10/16/2019   Procedure: APPENDECTOMY LAPAROSCOPIC;  Surgeon: Franky Macho, MD;  Location: [facility];  Service: General;  Laterality: N/A;  . MYRINGOTOMY WITH TUBE PLACEMENT    . NO PAST SURGERIES    . TYMPANOSTOMY TUBE PLACEMENT    . UPPER GI ENDOSCOPY      Family Psychiatric History: Please see initial evaluation for full details. I have reviewed the history. No updates at this time.     Family History:  Family History  Problem Relation Age of Onset  . Cancer Paternal Uncle   . COPD Maternal Grandfather    . Stroke Paternal Grandfather   . Depression Mother   . OCD Mother   . Anxiety disorder Mother   . Drug abuse Maternal Uncle   . Depression Maternal Uncle   . Anxiety disorder Maternal Uncle   . Depression Maternal Grandmother   . Anxiety disorder Brother   . Dementia Paternal Grandmother     Social History:  Social History   Socioeconomic History  . Marital status: Single    Spouse name: Not on file  . Number of children: Not on file  . Years of education: Not on file  . Highest education level: Not on file  Occupational History  . Not on file  Tobacco Use  . Smoking status: Current Every Day Smoker    Packs/day: 1.00    Types: Cigarettes    Start date: 01/28/2013  . Smokeless tobacco: Never Used  Vaping Use  . Vaping Use: Former  Substance and Sexual Activity  . Alcohol use: No    Alcohol/week: 0.0 standard drinks  . Drug use: No  . Sexual activity: Yes    Partners: Male    Birth control/protection: None  Other Topics Concern  . Not on file  Social History Narrative   ** Merged History Encounter **       Social Determinants of Health   Financial Resource Strain: Not on file  Food Insecurity: Not on file  Transportation Needs: Not on file  Physical Activity: Not on file  Stress: Not on file  Social Connections: Not on file    Allergies:  Allergies  Allergen Reactions  . Ciprofloxacin Nausea Only  . Sulfa Antibiotics Diarrhea and Nausea And Vomiting  . Buspar [Buspirone] Anxiety  . Levaquin [Levofloxacin] Anxiety    Metabolic Disorder Labs: Lab Results  Component Value Date   HGBA1C 5.4 06/05/2020   No results found for: PROLACTIN No results found for: CHOL, TRIG, HDL, CHOLHDL, VLDL, LDLCALC Lab Results  Component Value Date   TSH 0.957 01/08/2021   TSH 28.86 (H) 07/26/2018    Therapeutic Level Labs: No results found for: LITHIUM No results found for: VALPROATE No components found for:  CBMZ  Current Medications: Current Outpatient  Medications  Medication Sig Dispense Refill  . venlafaxine XR (EFFEXOR-XR) 150 MG 24 hr capsule 225 mg daily. Take along with 75 mg cap 30 capsule 1  . albuterol (VENTOLIN HFA) 108 (90 Base) MCG/ACT inhaler Inhale 2 puffs into the lungs every 4 (four) hours as needed for wheezing or shortness of breath. 1 each 1  . ALPRAZolam (XANAX) 1 MG tablet 0.5-1 mg daily as needed for anxiety 30 tablet 0  . fexofenadine (ALLEGRA) 180 MG tablet Take 180 mg by mouth daily as needed for allergies.     . predniSONE (DELTASONE) 50 MG tablet Take 1 tablet (50 mg total) by mouth daily. 5 tablet 0  . venlafaxine XR (EFFEXOR-XR) 75 MG 24  hr capsule 225 mg daily. Take along with 150 mg cap 30 capsule 1   Current Facility-Administered Medications  Medication Dose Route Frequency Provider Last Rate Last Admin  . diclofenac (VOLTAREN) EC tablet 75 mg  75 mg Oral BID Elson Areas, [place]         Musculoskeletal: Strength & Muscle Tone: N/A Gait & Station: N/A Patient leans: N/A  Psychiatric Specialty Exam: Review of Systems  Psychiatric/Behavioral: Negative for agitation, behavioral problems, confusion, decreased concentration, dysphoric mood, hallucinations, self-injury, sleep disturbance and suicidal ideas. The patient is nervous/anxious. The patient is not hyperactive.   All other systems reviewed and are negative.   There were no vitals taken for this visit.There is no height or weight on file to calculate BMI.  General Appearance: Fairly Groomed  Eye Contact:  Good  Speech:  Clear and Coherent  Volume:  Normal  Mood:  good  Affect:  Appropriate, Congruent and slightly fatigued  Thought Process:  Coherent  Orientation:  Full (Time, Place, and [name])  Thought Content: Logical   Suicidal Thoughts:  No  Homicidal Thoughts:  No  Memory:  Immediate;   Good  Judgement:  Good  Insight:  Good  Psychomotor Activity:  Normal  Concentration:  Concentration: Good and Attention Span: Good  Recall:  Good   Fund of Knowledge: Good  Language: Good  Akathisia:  No  Handed:  Right  AIMS (if indicated): not done  Assets:  Communication Skills Desire for Improvement  ADL's:  Intact  Cognition: WNL  Sleep:  Fair   Screenings: GAD-7   Advertising copywriter [facility] Office Visit from 03/07/2018 in [facility] Office Visit from 11/22/2017 in [facility]  Total GAD-7 Score 12 14 15     PHQ2-9   Flowsheet Row Video Visit from 01/26/2021 in [facility] Counselor from 01/05/2021 in [facility] Office Visit from 03/07/2018 in [facility]  PHQ-2 Total Score 1 1 5   PHQ-9 Total Score -- -- 18    Flowsheet Row Video Visit from 01/26/2021 in [facility] ED from 01/08/2021 in [facility] from 01/07/2021 in [facility] [place]  C-SSRS RISK CATEGORY No Risk No Risk No Risk       Assessment and Plan:  LEANORE BIGGERS is a 30 y.o. year old female with a history of depression, anxiety, ADHD, prediabetes, thyroid disease, who presents for follow up appointment for below.   1. GAD (generalized anxiety disorder) 2. MDD (major depressive disorder), recurrent, in partial remission (HCC) 3. Panic disorder Although she continues to report occasional anxiety, she denies depressive symptoms, and has been handling things relatively well.  Psychosocial stressors includes her son with ADHD.  Will continue current medication regimen.  Will continue venlafaxine to target anxiety.  Will continue Xanax as needed for anxiety.  She is aware of its risk of dependence and oversedation.   Plan 1 Continue venlafaxine225mg  daily- monitor weight gain  2.Continue Xanax 1 mg daily as needed for anxiety  3. Next appointment on 5/12 at 1:40  for 20 mins, video - She sees Ms. Bynum for  therapy - on hydrocodone, melatonin -sleep study in 2021- increased sleep latency, noOSA according to the patient  Last seen by PCP in 10/2020. fT4 wnl, LDL 119, HbA1c 6.2% on 10/2020  Past trials of medication:sertraline ("Zombie"),citalopram, lithium, Depakote, Abilify (visual impairment),xanax, ativan, clonazepam, adderall,Ritalin, Concerta,Trazodone (hypersomnia)  The patient demonstrates the following risk factors for suicide: Chronic risk factors for suicide include:psychiatric disorder ofanxiety. Acute risk factorsfor suicide include: N/A. Protective factorsfor this patient include: positive social support, responsibility to others (children, family), coping skills and hope for the future. Considering these factors, the overall suicide  Neysa Hottereina Jennie Bolar, MD 01/26/2021, 1:52 PM

## 2021-01-13 NOTE — Telephone Encounter (Signed)
Transition Care Management Unsuccessful Follow-up Telephone Call  Date of discharge and from where:  01/08/2021 from [facility].   Attempts:  3rd Attempt  Reason for unsuccessful TCM follow-up call:  Unable to reach patient

## 2021-01-15 DIAGNOSIS — Z6841 Body Mass Index (BMI) 40.0 and over, adult: Secondary | ICD-10-CM | POA: Diagnosis not present

## 2021-01-15 DIAGNOSIS — E8881 Metabolic syndrome: Secondary | ICD-10-CM | POA: Diagnosis not present

## 2021-01-26 ENCOUNTER — Telehealth (INDEPENDENT_AMBULATORY_CARE_PROVIDER_SITE_OTHER): Payer: Medicaid Other | Admitting: Psychiatry

## 2021-01-26 ENCOUNTER — Encounter: Payer: Self-pay | Admitting: Psychiatry

## 2021-01-26 ENCOUNTER — Other Ambulatory Visit: Payer: Self-pay

## 2021-01-26 DIAGNOSIS — F411 Generalized anxiety disorder: Secondary | ICD-10-CM | POA: Diagnosis not present

## 2021-01-26 DIAGNOSIS — F410 Panic disorder [episodic paroxysmal anxiety] without agoraphobia: Secondary | ICD-10-CM | POA: Diagnosis not present

## 2021-01-26 DIAGNOSIS — F3341 Major depressive disorder, recurrent, in partial remission: Secondary | ICD-10-CM | POA: Diagnosis not present

## 2021-01-26 MED ORDER — VENLAFAXINE HCL ER 75 MG PO CP24
ORAL_CAPSULE | ORAL | 1 refills | Status: DC
Start: 2021-01-26 — End: 2021-04-27

## 2021-01-26 MED ORDER — VENLAFAXINE HCL ER 150 MG PO CP24: ORAL_CAPSULE | ORAL | 1 refills | Status: DC

## 2021-01-26 NOTE — Patient Instructions (Signed)
1 Continue venlafaxine225mg  daily 2.Continue Xanax 1 mg daily as needed for anxiety  3. Next appointment on 5/12 at 1:40

## 2021-01-28 NOTE — Progress Notes (Signed)
Cardiology Office Note:    Date:  01/30/2021   ID:  OKTOBER GLAZER, DOB 1991/10/12, MRN [id number]  PCP:  Beal, Sheri, [place]  Cardiologist:  No primary care provider on file.  Advanced Practice Provider:  No care team member to display Electrophysiologist:  None    Referring MD: Delora Fuel, MD    History of Present Illness:    Linda [name] is a 30 y.o. female with a hx of pericarditis 3 years ago, anxiety, hypothyroidism and IBS who was referred by Dr. Roxanne Mins for further evaluation of chest pain.  Patient seen in the ER on 01/08/21 with chest pain. Note reviewed. Patient presenting to [facility] ED with 3 days of intermittent left sided chest pain with episodes lasting up to 78min before resolving. Pain was not exertional. Pain was similar to pericarditis she had 3 years prior, however, had some tenderness to palpation of her chest this time. In the ER, ECG with NSR, no ST-T wave changes. Trop negative. CXR without acute pathology. ESR returned at 45 which was indeterminate. She was given a short course of steroids and discharged home with plans to follow-up with Cardiology for further management.   Today, the patient states that she has been having intermittent chest pain for the past couple of months. States that it a squeezing sensation sensation in the center of the chest associated with some occasional shortness of breath. Cannot predict when it will come on. Can last minutes to hours. Pain is not exertional in nature. Has improved somewhat with steroid treatment. Symptoms similar to pericarditis in the past which she developed when she had pneumonia. Denies recent illness but states that prior to her ER visit, her temp was 100 at home with generalized malaise.   Family history of CAD in maternal grandfather.   Past Medical History:  Diagnosis Date  . Anxiety   . Asthma   . Heart murmur   . Hypothyroidism   . IBS (irritable bowel syndrome)    . Interstitial cystitis     Past Surgical History:  Procedure Laterality Date  . APPENDECTOMY    . CESAREAN SECTION N/A 03/20/2013   Procedure: CESAREAN SECTION;  Surgeon: Logan Bores, MD;  Location: [facility];  Service: Obstetrics;  Laterality: N/A;  . CESAREAN SECTION    . LAPAROSCOPIC APPENDECTOMY N/A 10/16/2019   Procedure: APPENDECTOMY LAPAROSCOPIC;  Surgeon: Aviva Signs, MD;  Location: [facility];  Service: General;  Laterality: N/A;  . MYRINGOTOMY WITH TUBE PLACEMENT    . NO PAST SURGERIES    . TYMPANOSTOMY TUBE PLACEMENT    . UPPER GI ENDOSCOPY      Current Medications: Current Meds  Medication Sig  . albuterol (VENTOLIN HFA) 108 (90 Base) MCG/ACT inhaler Inhale 2 puffs into the lungs every 4 (four) hours as needed for wheezing or shortness of breath.  . ALPRAZolam (XANAX) 1 MG tablet 0.5-1 mg daily as needed for anxiety  . fexofenadine (ALLEGRA) 180 MG tablet Take 180 mg by mouth daily as needed for allergies.   Marland Kitchen levothyroxine (SYNTHROID) 200 MCG tablet Take 200 mcg by mouth daily before breakfast.  . Multiple Vitamin (MULTIVITAMIN WITH MINERALS) TABS tablet Take 1 tablet by mouth daily.  . SELENIUM PO Take by mouth daily.  Marland Kitchen venlafaxine XR (EFFEXOR-XR) 150 MG 24 hr capsule 225 mg daily. Take along with 75 mg cap  . venlafaxine XR (EFFEXOR-XR) 75 MG 24 hr capsule 225 mg daily. Take along with  150 mg cap   Current Facility-Administered Medications for the 01/30/21 encounter (Office Visit) with Freada Bergeron, MD  Medication  . diclofenac (VOLTAREN) EC tablet 75 mg     Allergies:   Ciprofloxacin, Sulfa antibiotics, Buspar [buspirone], and Levaquin [levofloxacin]   Social History   Socioeconomic History  . Marital status: Single    Spouse name: Not on file  . Number of children: Not on file  . Years of education: Not on file  . Highest education level: Not on file  Occupational History  . Not on file  Tobacco Use  . Smoking status: Current Every Day Smoker     Packs/day: 1.00    Types: Cigarettes    Start date: 01/28/2013  . Smokeless tobacco: Never Used  Vaping Use  . Vaping Use: Former  Substance and Sexual Activity  . Alcohol use: No    Alcohol/week: 0.0 standard drinks  . Drug use: No  . Sexual activity: Yes    Partners: Male    Birth control/protection: None  Other Topics Concern  . Not on file  Social History Narrative   ** Merged History Encounter **       Social Determinants of Health   Financial Resource Strain: Not on file  Food Insecurity: Not on file  Transportation Needs: Not on file  Physical Activity: Not on file  Stress: Not on file  Social Connections: Not on file     Family History: The patient's family history includes Anxiety disorder in her brother, maternal uncle, and mother; COPD in her maternal grandfather; Cancer in her paternal uncle; Dementia in her paternal grandmother; Depression in her maternal grandmother, maternal uncle, and mother; Drug abuse in her maternal uncle; OCD in her mother; Stroke in her paternal grandfather.  ROS:   Please see the history of present illness.    Review of Systems  Constitutional: Negative for chills and fever.  HENT: Negative for hearing loss.   Eyes: Negative for blurred vision and redness.  Respiratory: Positive for shortness of breath.   Cardiovascular: Positive for chest pain. Negative for palpitations, orthopnea, claudication, leg swelling and PND.  Gastrointestinal: Negative for melena, nausea and vomiting.  Genitourinary: Negative for dysuria and flank pain.  Musculoskeletal: Negative for falls and myalgias.  Neurological: Negative for dizziness and loss of consciousness.  Endo/Heme/Allergies: Negative for polydipsia.  Psychiatric/Behavioral: Negative for substance abuse.    EKGs/Labs/Other Studies Reviewed:    The following studies were reviewed today: No cardiac studies  EKG:  EKG 01/07/21: NSR with no STE  Recent Labs: 01/08/2021: BUN 9;  Creatinine, Ser 0.69; Hemoglobin 12.7; Platelets 341; Potassium 3.6; Sodium 137; TSH 0.957  Recent Lipid Panel No results found for: CHOL, TRIG, HDL, CHOLHDL, VLDL, LDLCALC, LDLDIRECT   Physical Exam:    VS:  BP (!) 152/82   Pulse 90   Ht $R'5\' 5"'BV$  (1.651 m)   Wt 276 lb 3.2 oz (125.3 kg)   SpO2 97%   BMI 45.96 kg/m     Wt Readings from Last 3 Encounters:  01/30/21 276 lb 3.2 oz (125.3 kg)  01/08/21 230 lb (104.3 kg)  07/10/20 283 lb 3.2 oz (128.5 kg)     GEN:  Well nourished, well developed in no acute distress HEENT: Normal NECK: No JVD; No carotid bruits CARDIAC: RRR, no murmurs, rubs, gallops RESPIRATORY:  Clear to auscultation without rales, wheezing or rhonchi  ABDOMEN: Soft, non-tender, non-distended MUSCULOSKELETAL:  No edema; No deformity  SKIN: Warm and dry NEUROLOGIC:  Alert  and oriented x 3 PSYCHIATRIC:  Normal affect   ASSESSMENT:    1. Precordial pain   2. Pericarditis, unspecified chronicity, unspecified type    PLAN:    In order of problems listed above:  #Chest Pain: #History of Pericarditis: Work-up in ED reassuring. ECG with NSR with no ST-T wave changes. Trop negative. CXR without acute pathology. ESR 45 and patient placed on short course of steroids given history of pericarditis with improvement but no resolution of symptoms. Given history and continued symptoms, will check TTE, CRP and ESR today. -Check CRP and ESR -Check TTE -If inflammatory markers elevated, will plan to treat with high dose NSAIDs    Medication Adjustments/Labs and Tests Ordered: Current medicines are reviewed at length with the patient today.  Concerns regarding medicines are outlined above.  Orders Placed This Encounter  Procedures  . Sedimentation rate  . C-reactive protein  . ECHOCARDIOGRAM COMPLETE   No orders of the defined types were placed in this encounter.   Patient Instructions  Medication Instructions:  Your physician recommends that you continue on your  current medications as directed. Please refer to the Current Medication list given to you today. *If you need a refill on your cardiac medications before your next appointment, please call your pharmacy*   Lab Work: Today: ESR and CRP If you have labs (blood work) drawn today and your tests are completely normal, you will receive your results only by: Marland Kitchen MyChart Message (if you have MyChart) OR . A paper copy in the mail If you have any lab test that is abnormal or we need to change your treatment, we will call you to review the results.   Testing/Procedures: Your physician has requested that you have an echocardiogram. Echocardiography is a painless test that uses sound waves to create images of your heart. It provides your doctor with information about the size and shape of your heart and how well your heart's chambers and valves are working. This procedure takes approximately one hour. There are no restrictions for this procedure.    Follow-Up: At [facility], you and your health needs are our priority.  As part of our continuing mission to provide you with exceptional heart care, we have created designated Provider Care Teams.  These Care Teams include your primary Cardiologist (physician) and Advanced Practice Providers (APPs -  Physician Assistants and Nurse Practitioners) who all work together to provide you with the care you need, when you need it.  We recommend signing up for the patient portal called "MyChart".  Sign up information is provided on this After Visit Summary.  MyChart is used to connect with patients for Virtual Visits (Telemedicine).  Patients are able to view lab/test results, encounter notes, upcoming appointments, etc.  Non-urgent messages can be sent to your provider as well.   To learn more about what you can do with MyChart, go to [email or web address].    Your next appointment:   As Needed with Dr. Johney Frame     Signed, Freada Bergeron, MD   01/30/2021 4:52 PM    [place]

## 2021-01-30 ENCOUNTER — Other Ambulatory Visit: Payer: Self-pay

## 2021-01-30 ENCOUNTER — Encounter: Payer: Self-pay | Admitting: Cardiology

## 2021-01-30 ENCOUNTER — Ambulatory Visit: Payer: Medicaid Other | Admitting: Cardiology

## 2021-01-30 VITALS — BP 152/82 | HR 90 | Ht 65.0 in | Wt 276.2 lb

## 2021-01-30 DIAGNOSIS — I319 Disease of pericardium, unspecified: Secondary | ICD-10-CM | POA: Diagnosis not present

## 2021-01-30 DIAGNOSIS — R072 Precordial pain: Secondary | ICD-10-CM

## 2021-01-30 NOTE — Patient Instructions (Addendum)
Medication Instructions:  Your physician recommends that you continue on your current medications as directed. Please refer to the Current Medication list given to you today. *If you need a refill on your cardiac medications before your next appointment, please call your pharmacy*   Lab Work: Today: ESR and CRP If you have labs (blood work) drawn today and your tests are completely normal, you will receive your results only by: Marland Kitchen MyChart Message (if you have MyChart) OR . A paper copy in the mail If you have any lab test that is abnormal or we need to change your treatment, we will call you to review the results.   Testing/Procedures: Your physician has requested that you have an echocardiogram. Echocardiography is a painless test that uses sound waves to create images of your heart. It provides your doctor with information about the size and shape of your heart and how well your heart's chambers and valves are working. This procedure takes approximately one hour. There are no restrictions for this procedure.    Follow-Up: At [facility], you and your health needs are our priority.  As part of our continuing mission to provide you with exceptional heart care, we have created designated Provider Care Teams.  These Care Teams include your primary Cardiologist (physician) and Advanced Practice Providers (APPs -  Physician Assistants and Nurse Practitioners) who all work together to provide you with the care you need, when you need it.  We recommend signing up for the patient portal called "MyChart".  Sign up information is provided on this After Visit Summary.  MyChart is used to connect with patients for Virtual Visits (Telemedicine).  Patients are able to view lab/test results, encounter notes, upcoming appointments, etc.  Non-urgent messages can be sent to your provider as well.   To learn more about what you can do with MyChart, go to [email or web address].    Your next appointment:    As Needed with Dr. Johney Frame

## 2021-01-31 LAB — C-REACTIVE PROTEIN: CRP: 19 mg/L — ABNORMAL HIGH (ref 0–10)

## 2021-01-31 LAB — SEDIMENTATION RATE: Sed Rate: 42 mm/hr — ABNORMAL HIGH (ref 0–32)

## 2021-02-10 DIAGNOSIS — R700 Elevated erythrocyte sedimentation rate: Secondary | ICD-10-CM | POA: Diagnosis not present

## 2021-02-10 DIAGNOSIS — M791 Myalgia, unspecified site: Secondary | ICD-10-CM | POA: Diagnosis not present

## 2021-02-10 DIAGNOSIS — Z713 Dietary counseling and surveillance: Secondary | ICD-10-CM | POA: Diagnosis not present

## 2021-02-10 DIAGNOSIS — Z6841 Body Mass Index (BMI) 40.0 and over, adult: Secondary | ICD-10-CM | POA: Diagnosis not present

## 2021-02-10 DIAGNOSIS — M2550 Pain in unspecified joint: Secondary | ICD-10-CM | POA: Diagnosis not present

## 2021-02-10 DIAGNOSIS — E8881 Metabolic syndrome: Secondary | ICD-10-CM | POA: Diagnosis not present

## 2021-02-16 DIAGNOSIS — R700 Elevated erythrocyte sedimentation rate: Secondary | ICD-10-CM | POA: Diagnosis not present

## 2021-02-16 DIAGNOSIS — M2550 Pain in unspecified joint: Secondary | ICD-10-CM | POA: Diagnosis not present

## 2021-02-16 DIAGNOSIS — M791 Myalgia, unspecified site: Secondary | ICD-10-CM | POA: Diagnosis not present

## 2021-02-18 DIAGNOSIS — E038 Other specified hypothyroidism: Secondary | ICD-10-CM | POA: Diagnosis not present

## 2021-02-18 DIAGNOSIS — Z87891 Personal history of nicotine dependence: Secondary | ICD-10-CM | POA: Diagnosis not present

## 2021-02-18 DIAGNOSIS — E559 Vitamin D deficiency, unspecified: Secondary | ICD-10-CM | POA: Diagnosis not present

## 2021-02-18 DIAGNOSIS — E538 Deficiency of other specified B group vitamins: Secondary | ICD-10-CM | POA: Diagnosis not present

## 2021-02-18 DIAGNOSIS — K760 Fatty (change of) liver, not elsewhere classified: Secondary | ICD-10-CM | POA: Diagnosis not present

## 2021-02-18 DIAGNOSIS — Z6841 Body Mass Index (BMI) 40.0 and over, adult: Secondary | ICD-10-CM | POA: Diagnosis not present

## 2021-02-18 DIAGNOSIS — E063 Autoimmune thyroiditis: Secondary | ICD-10-CM | POA: Diagnosis not present

## 2021-02-18 DIAGNOSIS — R7303 Prediabetes: Secondary | ICD-10-CM | POA: Diagnosis not present

## 2021-03-01 ENCOUNTER — Encounter (HOSPITAL_COMMUNITY): Payer: Self-pay | Admitting: *Deleted

## 2021-03-01 ENCOUNTER — Emergency Department (HOSPITAL_COMMUNITY): Payer: Medicaid Other

## 2021-03-01 ENCOUNTER — Other Ambulatory Visit: Payer: Self-pay

## 2021-03-01 ENCOUNTER — Emergency Department (HOSPITAL_COMMUNITY)
Admission: EM | Admit: 2021-03-01 | Discharge: 2021-03-01 | Disposition: A | Discharge status: Home / Self Care | Payer: Medicaid Other | Attending: Emergency Medicine | Admitting: Emergency Medicine

## 2021-03-01 DIAGNOSIS — M5459 Other low back pain: Secondary | ICD-10-CM | POA: Diagnosis not present

## 2021-03-01 DIAGNOSIS — F17210 Nicotine dependence, cigarettes, uncomplicated: Secondary | ICD-10-CM | POA: Diagnosis not present

## 2021-03-01 DIAGNOSIS — E039 Hypothyroidism, unspecified: Secondary | ICD-10-CM | POA: Insufficient documentation

## 2021-03-01 DIAGNOSIS — J45909 Unspecified asthma, uncomplicated: Secondary | ICD-10-CM | POA: Diagnosis not present

## 2021-03-01 DIAGNOSIS — M545 Low back pain, unspecified: Secondary | ICD-10-CM | POA: Diagnosis not present

## 2021-03-01 DIAGNOSIS — Z79899 Other long term (current) drug therapy: Secondary | ICD-10-CM | POA: Insufficient documentation

## 2021-03-01 LAB — POC URINE PREG, ED: Preg Test, Ur: NEGATIVE

## 2021-03-01 MED ORDER — METHOCARBAMOL 500 MG PO TABS
500.0000 mg | ORAL_TABLET | Freq: Once | ORAL | Status: AC
  Administered 2021-03-01: 500 mg via ORAL
  Filled 2021-03-01: qty 1

## 2021-03-01 MED ORDER — IBUPROFEN 800 MG PO TABS: 800.0000 mg | ORAL_TABLET | Freq: Three times a day (TID) | ORAL | 0 refills | Status: DC

## 2021-03-01 MED ORDER — KETOROLAC TROMETHAMINE 60 MG/2ML IM SOLN
60.0000 mg | Freq: Once | INTRAMUSCULAR | Status: AC
  Administered 2021-03-01: 60 mg via INTRAMUSCULAR
  Filled 2021-03-01: qty 2

## 2021-03-01 MED ORDER — METHOCARBAMOL 500 MG PO TABS: 500.0000 mg | ORAL_TABLET | Freq: Two times a day (BID) | ORAL | 0 refills | Status: DC

## 2021-03-01 NOTE — ED Notes (Signed)
Pt able to stand from wheelchair to sit on stretcher. Currently resting on stretcher in position of comfort. States it is more painful "when I try to straighten my back out." Updated on POC to see provider. No additional questions at this time.

## 2021-03-01 NOTE — ED Notes (Signed)
Pt taken to xray 

## 2021-03-01 NOTE — ED Triage Notes (Signed)
Pt with lower back since fall that occurred two days ago.  Pt states she fell down a flight of stairs.  Unsure if she hit her head.  Denies any LOC.

## 2021-03-01 NOTE — ED Provider Notes (Signed)
[name] EMERGENCY DEPARTMENT Provider Note   CSN: [id number] Arrival date & time: 03/01/21  1502     History Chief Complaint  Patient presents with  . Back Pain    Linda [name] is a 30 y.o. female.  HPI 30 year old female who presents to the ER with complaints of low back pain.  Patient states that she slipped down one step and landed on her back on Friday.  She states she was able to get up and walk, however has progressively had worsening low back pain to her low back.  She has tried ibuprofen with little relief.  She denies any numbness or tingling, no loss of bowel bladder control.  No unintended weight loss, no night sweats.  No history of IV drug use.  She states that she feels spasming in her back, which is worse with movement.  Pain is relieved with certain sitting positions.  Denies any flank tenderness, no dysuria, hematuria.    Past Medical History:  Diagnosis Date  . Anxiety   . Asthma   . Heart murmur   . Hypothyroidism   . IBS (irritable bowel syndrome)   . Interstitial cystitis     Patient Active Problem List   Diagnosis Date Noted  . Hidradenitis suppurativa of left axilla 07/10/2020  . Hepatomegaly 03/16/2020  . Hepatic steatosis 03/16/2020  . S/P laparoscopic appendectomy 10/16/2019  . Acute appendicitis, uncomplicated   . Ovarian cyst, right   . Mood disorder in conditions classified elsewhere 04/06/2018  . Generalized anxiety disorder 04/06/2018  . Hypothyroidism 06/21/2014  . Interstitial cystitis 06/14/2014  . 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 Morbid obesity (HCC) 02/07/2014  . Asthma, chronic 04/15/2013  . Chronic anxiety 04/15/2013  . Cervical strain 03/07/2012  . Sprain and strain of unspecified site of shoulder and upper arm 03/07/2012  . Pain in joint, shoulder region 03/07/2012    Past Surgical History:  Procedure Laterality Date  . APPENDECTOMY    . CESAREAN SECTION N/A 03/20/2013    Procedure: CESAREAN SECTION;  Surgeon: [name], MD;  Location: [facility];  Service: Obstetrics;  Laterality: N/A;  . CESAREAN SECTION    . LAPAROSCOPIC APPENDECTOMY N/A 10/16/2019   Procedure: APPENDECTOMY LAPAROSCOPIC;  Surgeon: Franky Macho, MD;  Location: [facility];  Service: General;  Laterality: N/A;  . MYRINGOTOMY WITH TUBE PLACEMENT    . NO PAST SURGERIES    . TYMPANOSTOMY TUBE PLACEMENT    . UPPER GI ENDOSCOPY       OB History    Gravida  1   Para  1   Term  0   Preterm  1   AB  0   Living  1     SAB  0   IAB  0   Ectopic  0   Multiple      Live Births  1           Family History  Problem Relation Age of Onset  . Cancer Paternal Uncle   . COPD Maternal Grandfather   . Stroke Paternal Grandfather   . Depression Mother   . OCD Mother   . Anxiety disorder Mother   . Drug abuse Maternal Uncle   . Depression Maternal Uncle   . Anxiety disorder Maternal Uncle   . Depression Maternal Grandmother   . Anxiety disorder Brother   . Dementia Paternal Grandmother     Social History   Tobacco Use  . Smoking status: Current Every  Day Smoker    Packs/day: 1.00    Types: Cigarettes    Start date: 01/28/2013  . Smokeless tobacco: Never Used  Vaping Use  . Vaping Use: Former  Substance Use Topics  . Alcohol use: No    Alcohol/week: 0.0 standard drinks  . Drug use: No    Home Medications Prior to Admission medications   Medication Sig Start Date End Date Taking? Authorizing Provider  ibuprofen (ADVIL) 800 MG tablet Take 1 tablet (800 mg total) by mouth 3 (three) times daily. 03/01/21  Yes Mare Ferrari, PA-C  methocarbamol (ROBAXIN) 500 MG tablet Take 1 tablet (500 mg total) by mouth 2 (two) times daily. 03/01/21  Yes Mare Ferrari, PA-C  albuterol (VENTOLIN HFA) 108 (90 Base) MCG/ACT inhaler Inhale 2 puffs into the lungs every 4 (four) hours as needed for wheezing or shortness of breath. 10/31/20   Campbell Riches, NP  ALPRAZolam Prudy Feeler) 1 MG  tablet 0.5-1 mg daily as needed for anxiety 12/01/20   Neysa Hotter, MD  fexofenadine (ALLEGRA) 180 MG tablet Take 180 mg by mouth daily as needed for allergies.     [provider]  levothyroxine (SYNTHROID) 200 MCG tablet Take 200 mcg by mouth daily before breakfast.    [provider]  Multiple Vitamin (MULTIVITAMIN WITH MINERALS) TABS tablet Take 1 tablet by mouth daily.    [provider]  SELENIUM PO Take by mouth daily.    [provider]  venlafaxine XR (EFFEXOR-XR) 150 MG 24 hr capsule 225 mg daily. Take along with 75 mg cap 01/26/21   Neysa Hotter, MD  venlafaxine XR (EFFEXOR-XR) 75 MG 24 hr capsule 225 mg daily. Take along with 150 mg cap 01/26/21   Neysa Hotter, MD    Allergies    Ciprofloxacin, Sulfa antibiotics, Buspar [buspirone], and Levaquin [levofloxacin]  Review of Systems   Review of Systems  Musculoskeletal: Positive for back pain.  Neurological: Negative for weakness and numbness.    Physical Exam Updated Vital Signs BP 136/73 (BP Location: Right Arm)   Pulse 96   Temp 98.6 F (37 C) (Oral)   Resp 18   Ht 5' 5.5" (1.664 m)   Wt 122.5 kg   LMP 02/14/2021   SpO2 98%   BMI 44.25 kg/m   Physical Exam Vitals and nursing note reviewed.  Constitutional:      General: She is not in acute distress.    Appearance: She is well-developed.  HENT:     Head: Normocephalic and atraumatic.  Eyes:     Conjunctiva/sclera: Conjunctivae normal.  Cardiovascular:     Rate and Rhythm: Normal rate and regular rhythm.     Heart sounds: No murmur heard.   Pulmonary:     Effort: Pulmonary effort is normal. No respiratory distress.     Breath sounds: Normal breath sounds.  Abdominal:     Palpations: Abdomen is soft.     Tenderness: There is no abdominal tenderness.  Musculoskeletal:        General: Tenderness present.     Cervical back: Neck supple.     Right lower leg: No edema.     Left lower leg: No edema.     Comments: No C, T  spine tenderness.  She does have some mild midline L-spine tenderness.  5/5 strength in upper and lower extremities.  No noticeable step-offs, crepitus, fluctuance, erythema.  Sensations intact.  Full range of motion and strength of neck.  Slightly restricted range of motion  of the low back secondary to pain.  However moving all 4 extremities without difficulty.   Skin:    General: Skin is warm and dry.     Findings: No erythema.  Neurological:     General: No focal deficit present.     Mental Status: She is alert and oriented to person, place, and time.  Psychiatric:        Mood and Affect: Mood normal.        Behavior: Behavior normal.     ED Results / Procedures / Treatments   Labs (all labs ordered are listed, but only abnormal results are displayed) Labs Reviewed  POC URINE PREG, ED    EKG None  Radiology DG Lumbar Spine Complete  Result Date: 03/01/2021 CLINICAL DATA:  Pt with lower back since fall that occurred two days ago. Pt states she fell down a flight of stairs. Pt says pain is worse when she tries to lay straight. EXAM: LUMBAR SPINE - COMPLETE 4+ VIEW COMPARISON:  Lumbar spine radiographs 01/02/2008 FINDINGS: Normal alignment. Vertebral body heights and intervertebral disc spaces are maintained. No focal bone lesion. No significant degenerative change. SI joints are open and symmetric. Nonobstructive bowel gas pattern. IMPRESSION: Negative lumbar spine radiographs. If symptoms persist or there is high clinical concern for radiographically occult injury, consider cross-sectional imaging for further evaluation. Electronically Signed   By: Emmaline Kluver M.D.   On: 03/01/2021 16:03    Procedures Procedures   Medications Ordered in ED Medications  ketorolac (TORADOL) injection 60 mg (60 mg Intramuscular Given 03/01/21 1534)  methocarbamol (ROBAXIN) tablet 500 mg (500 mg Oral Given 03/01/21 1533)    ED Course  I have reviewed the triage vital signs and the nursing  notes.  Pertinent labs & imaging results that were available during my care of the patient were reviewed by me and considered in my medical decision making (see chart for details).    MDM Rules/Calculators/A&P                          Patient with back pain.  No neurological deficits and normal neuro exam.  Patient can walk but states is painful.  No loss of bowel or bladder control.  No concern for cauda equina.  No fever, night sweats, weight loss, h/o cancer, IVDU.  Plain films without evidence of fractures.  RICE protocol and pain medicine indicated and discussed with patient.  Patient will be prescribed a muscle relaxer, she was informed to not drink or drive on this medication as it can make her sleepy.  Encouraged PCP follow-up.  We discussed return precautions.  She voiced understanding and is agreeable.  Stable for discharge.  Final Clinical Impression(s) / ED Diagnoses Final diagnoses:  Acute midline low back pain without sciatica    Rx / DC Orders ED Discharge Orders         Ordered    ibuprofen (ADVIL) 800 MG tablet  3 times daily        03/01/21 1612    methocarbamol (ROBAXIN) 500 MG tablet  2 times daily        03/01/21 1612           Leone Brand 03/01/21 1612    Derwood Kaplan, MD 03/03/21 1733

## 2021-03-01 NOTE — Discharge Instructions (Addendum)
Your back pain should be treated with medicines such as ibuprofen or aleve and this back pain should get better over the next 2 weeks. However if you develop severe or worsening pain, low back pain with fever, numbness, weakness or inability to walk or urinate, you should return to the ER immediately.  Please follow up with your doctor this week for a recheck if still having symptoms. Low back pain is discomfort in the lower back that may be due to injuries to muscles and ligaments around the spine.  Occasionally, it may be caused by a a problem to a part of the spine called a disc.  The pain may last several days or a week;  However, most patients get completely well in 4 weeks.

## 2021-03-02 ENCOUNTER — Ambulatory Visit (HOSPITAL_COMMUNITY): Payer: Medicaid Other

## 2021-03-02 ENCOUNTER — Other Ambulatory Visit: Payer: Self-pay

## 2021-03-02 ENCOUNTER — Encounter (HOSPITAL_COMMUNITY): Payer: Self-pay | Admitting: *Deleted

## 2021-03-02 ENCOUNTER — Emergency Department (HOSPITAL_COMMUNITY): Payer: Medicaid Other

## 2021-03-02 ENCOUNTER — Emergency Department (HOSPITAL_COMMUNITY)
Admission: EM | Admit: 2021-03-02 | Discharge: 2021-03-02 | Disposition: A | Discharge status: Home / Self Care | Payer: Medicaid Other | Attending: Emergency Medicine | Admitting: Emergency Medicine

## 2021-03-02 DIAGNOSIS — M545 Low back pain, unspecified: Secondary | ICD-10-CM | POA: Insufficient documentation

## 2021-03-02 DIAGNOSIS — W108XXA Fall (on) (from) other stairs and steps, initial encounter: Secondary | ICD-10-CM | POA: Insufficient documentation

## 2021-03-02 DIAGNOSIS — Z87891 Personal history of nicotine dependence: Secondary | ICD-10-CM | POA: Insufficient documentation

## 2021-03-02 DIAGNOSIS — J449 Chronic obstructive pulmonary disease, unspecified: Secondary | ICD-10-CM | POA: Insufficient documentation

## 2021-03-02 DIAGNOSIS — Z79899 Other long term (current) drug therapy: Secondary | ICD-10-CM | POA: Diagnosis not present

## 2021-03-02 DIAGNOSIS — E039 Hypothyroidism, unspecified: Secondary | ICD-10-CM | POA: Insufficient documentation

## 2021-03-02 MED ORDER — HYDROMORPHONE HCL 1 MG/ML IJ SOLN
1.0000 mg | Freq: Once | INTRAMUSCULAR | Status: AC
Start: 2021-03-02 — End: 2021-03-02
  Administered 2021-03-02: 1 mg via INTRAMUSCULAR
  Filled 2021-03-02: qty 1

## 2021-03-02 MED ORDER — PREDNISONE 50 MG PO TABS
60.0000 mg | ORAL_TABLET | Freq: Once | ORAL | Status: AC
  Administered 2021-03-02: 60 mg via ORAL
  Filled 2021-03-02: qty 1

## 2021-03-02 MED ORDER — OXYCODONE-ACETAMINOPHEN 5-325 MG PO TABS: 1.0000 | ORAL_TABLET | Freq: Four times a day (QID) | ORAL | 0 refills | Status: DC | PRN

## 2021-03-02 NOTE — Discharge Instructions (Addendum)
Call the spine specialist to make an appointment within the next 2 to 3 days. Return immediately back to the ER if:  Your symptoms worsen within the next 12-24 hours. You develop new symptoms such as new fevers, new pain.  New weakness or numbness.  Loss of control of bowel or bladder or any additional concerns.

## 2021-03-02 NOTE — ED Triage Notes (Signed)
Pt c/o back pain due to falling down a flight of stairs yesterday. Pt was seen here yesterday at ED and given medication without relief at home. Pt reports she had "a little bit" of LOC.

## 2021-03-02 NOTE — ED Provider Notes (Signed)
[facility] EMERGENCY DEPARTMENT Provider Note   CSN: [id number] Arrival date & time: 03/02/21  9562     History No chief complaint on file.   Linda [name] is a 30 y.o. female.  Patient presents ER chief complaint of persistent lower back pain.  She states that yesterday she fell down a few steps and was seen in the ER.  She had x-rays done that were unremarkable and sent home.  She been taking Motrin at home without relief of pain.  She denies any bowel or bladder dysfunction.  Denies any numbness when she wipes herself on her bottom.  However she has persistent lower back pain unrelieved with medication at home and presents to ER.  Denies fevers cough vomiting or diarrhea.        Past Medical History:  Diagnosis Date  . Anxiety   . Asthma   . Heart murmur   . Hypothyroidism   . IBS (irritable bowel syndrome)   . Interstitial cystitis     Patient Active Problem List   Diagnosis Date Noted  . Hidradenitis suppurativa of left axilla 07/10/2020  . Hepatomegaly 03/16/2020  . Hepatic steatosis 03/16/2020  . S/P laparoscopic appendectomy 10/16/2019  . Acute appendicitis, uncomplicated   . Ovarian cyst, right   . Mood disorder in conditions classified elsewhere 04/06/2018  . Generalized anxiety disorder 04/06/2018  . Hypothyroidism 06/21/2014  . Interstitial cystitis 06/14/2014  . 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 Morbid obesity (HCC) 02/07/2014  . Asthma, chronic 04/15/2013  . Chronic anxiety 04/15/2013  . Cervical strain 03/07/2012  . Sprain and strain of unspecified site of shoulder and upper arm 03/07/2012  . Pain in joint, shoulder region 03/07/2012    Past Surgical History:  Procedure Laterality Date  . APPENDECTOMY    . CESAREAN SECTION N/A 03/20/2013   Procedure: CESAREAN SECTION;  Surgeon: [name], MD;  Location: [facility];  Service: Obstetrics;  Laterality: N/A;  . CESAREAN SECTION    . LAPAROSCOPIC  APPENDECTOMY N/A 10/16/2019   Procedure: APPENDECTOMY LAPAROSCOPIC;  Surgeon: Franky Macho, MD;  Location: [facility];  Service: General;  Laterality: N/A;  . MYRINGOTOMY WITH TUBE PLACEMENT    . NO PAST SURGERIES    . TYMPANOSTOMY TUBE PLACEMENT    . UPPER GI ENDOSCOPY       OB History    Gravida  1   Para  1   Term  0   Preterm  1   AB  0   Living  1     SAB  0   IAB  0   Ectopic  0   Multiple      Live Births  1           Family History  Problem Relation Age of Onset  . Cancer Paternal Uncle   . COPD Maternal Grandfather   . Stroke Paternal Grandfather   . Depression Mother   . OCD Mother   . Anxiety disorder Mother   . Drug abuse Maternal Uncle   . Depression Maternal Uncle   . Anxiety disorder Maternal Uncle   . Depression Maternal Grandmother   . Anxiety disorder Brother   . Dementia Paternal Grandmother     Social History   Tobacco Use  . Smoking status: Former Smoker    Packs/day: 1.00    Types: Cigarettes    Start date: 01/28/2013  . Smokeless tobacco: Never Used  Vaping Use  . Vaping Use: Former  Substance Use Topics  . Alcohol use: No    Alcohol/week: 0.0 standard drinks  . Drug use: No    Home Medications Prior to Admission medications   Medication Sig Start Date End Date Taking? Authorizing Provider  albuterol (VENTOLIN HFA) 108 (90 Base) MCG/ACT inhaler Inhale 2 puffs into the lungs every 4 (four) hours as needed for wheezing or shortness of breath. 10/31/20   Campbell Riches, NP  ALPRAZolam Prudy Feeler) 1 MG tablet 0.5-1 mg daily as needed for anxiety 12/01/20   Neysa Hotter, MD  fexofenadine (ALLEGRA) 180 MG tablet Take 180 mg by mouth daily as needed for allergies.     [provider]  ibuprofen (ADVIL) 800 MG tablet Take 1 tablet (800 mg total) by mouth 3 (three) times daily. 03/01/21   Mare Ferrari, PA-C  levothyroxine (SYNTHROID) 200 MCG tablet Take 200 mcg by mouth daily before breakfast.    [provider]   methocarbamol (ROBAXIN) 500 MG tablet Take 1 tablet (500 mg total) by mouth 2 (two) times daily. 03/01/21   Mare Ferrari, PA-C  Multiple Vitamin (MULTIVITAMIN WITH MINERALS) TABS tablet Take 1 tablet by mouth daily.    [provider]  SELENIUM PO Take by mouth daily.    [provider]  venlafaxine XR (EFFEXOR-XR) 150 MG 24 hr capsule 225 mg daily. Take along with 75 mg cap 01/26/21   Neysa Hotter, MD  venlafaxine XR (EFFEXOR-XR) 75 MG 24 hr capsule 225 mg daily. Take along with 150 mg cap 01/26/21   Neysa Hotter, MD    Allergies    Ciprofloxacin, Sulfa antibiotics, Buspar [buspirone], and Levaquin [levofloxacin]  Review of Systems   Review of Systems  Constitutional: Negative for fever.  HENT: Negative for ear pain.   Eyes: Negative for pain.  Respiratory: Negative for cough.   Cardiovascular: Negative for chest pain.  Gastrointestinal: Negative for abdominal pain.  Genitourinary: Negative for flank pain.  Musculoskeletal: Positive for back pain.  Skin: Negative for rash.  Neurological: Negative for headaches.    Physical Exam Updated Vital Signs BP 120/71 (BP Location: Left Arm)   Pulse 74   Temp 98.2 F (36.8 C) (Oral)   Resp 18   Ht 5' 5.5" (1.664 m)   Wt 122.5 kg   LMP 02/14/2021   SpO2 100%   BMI 44.25 kg/m   Physical Exam Constitutional:      General: She is not in acute distress.    Appearance: Normal appearance.  HENT:     Head: Normocephalic.     Nose: Nose normal.  Eyes:     Extraocular Movements: Extraocular movements intact.  Cardiovascular:     Rate and Rhythm: Normal rate.  Pulmonary:     Effort: Pulmonary effort is normal.  Musculoskeletal:        General: Normal range of motion.     Cervical back: Normal range of motion.     Comments: No C or T-spine midline step-offs or tenderness noted.  L-spine approximately L4-5 midline tenderness noted.  Neurological:     General: No focal deficit present.     Mental Status: She  is alert. Mental status is at baseline.     Comments: 5/5 strength all extremities.  Rectal exam has normal rectal tone no saddle anesthesia noted.     ED Results / Procedures / Treatments   Labs (all labs ordered are listed, but only abnormal results are displayed) Labs Reviewed - No data to display  EKG None  Radiology DG Lumbar Spine Complete  Result Date: 03/01/2021 CLINICAL DATA:  Pt with lower back since fall that occurred two days ago. Pt states she fell down a flight of stairs. Pt says pain is worse when she tries to lay straight. EXAM: LUMBAR SPINE - COMPLETE 4+ VIEW COMPARISON:  Lumbar spine radiographs 01/02/2008 FINDINGS: Normal alignment. Vertebral body heights and intervertebral disc spaces are maintained. No focal bone lesion. No significant degenerative change. SI joints are open and symmetric. Nonobstructive bowel gas pattern. IMPRESSION: Negative lumbar spine radiographs. If symptoms persist or there is high clinical concern for radiographically occult injury, consider cross-sectional imaging for further evaluation. Electronically Signed   By: Emmaline Kluver M.D.   On: 03/01/2021 16:03   CT Lumbar Spine Wo Contrast  Result Date: 03/02/2021 CLINICAL DATA:  Low back pain.  Fell down steps 02/27/2021. EXAM: CT LUMBAR SPINE WITHOUT CONTRAST TECHNIQUE: Multidetector CT imaging of the lumbar spine was performed without intravenous contrast administration. Multiplanar CT image reconstructions were also generated. COMPARISON:  None. FINDINGS: Segmentation: Normal Alignment: Normal Vertebrae: Negative for fracture or mass. Paraspinal and other soft tissues: Negative for paraspinous mass or soft tissue edema. No retroperitoneal adenopathy identified. Disc levels: L1-2: Negative L2-3: Negative L3-4: Negative L4-5: Mild disc space narrowing. Mild to moderate central disc protrusion with mild associated spurring. Early facet degeneration. Mild spinal stenosis and subarticular stenosis  bilaterally. L5-S1: Normal disc space. No stenosis. Unilateral articulation of the right transverse process with the sacrum. 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 Central disc protrusion. Possible bilateral L5 nerve root impingement. Electronically Signed   By: Marlan Palau M.D.   On: 03/02/2021 12:00    Procedures Procedures   Medications Ordered in ED Medications  predniSONE (DELTASONE) tablet 60 mg (60 mg Oral Given 03/02/21 1126)  HYDROmorphone (DILAUDID) injection 1 mg (1 mg Intramuscular Given 03/02/21 1127)    ED Course  I have reviewed the triage vital signs and the nursing notes.  Pertinent labs & imaging results that were available during my care of the patient were reviewed by me and considered in my medical decision making (see chart for details).    MDM Rules/Calculators/A&P                          Given persistent pain CT imaging pursued of the L-spine.  Read by radiologist as moderate level central disc protrusion possible bilateral L5 nerve root impingement.  Patient has no focal neuro deficit at this time.  She is ambulatory but with a painful gait.  I will recommend outpatient follow-up with spine specialist within the week.  Advised me to return for new numbness weakness or saddle anesthesia or bowel or bladder dysfunction.  Advised medication at home which I prescribed.  Final Clinical Impression(s) / ED Diagnoses Final diagnoses:  Acute midline low back pain without sciatica    Rx / DC Orders ED Discharge Orders    None       Cheryll Cockayne, MD 03/02/21 1303

## 2021-03-03 ENCOUNTER — Telehealth: Payer: Self-pay

## 2021-03-03 NOTE — Telephone Encounter (Signed)
Transition Care Management Follow-up Telephone Call  Date of discharge and from where: 03/02/2021 from [facility]  How have you been since you were released from the hospital? Pt stated that she is okay but she is still in a lot of pain.   Any questions or concerns? No  Items Reviewed:  Did the pt receive and understand the discharge instructions provided? Yes   Medications obtained and verified? Yes   Other? No   Any new allergies since your discharge? No   Dietary orders reviewed? n/a  Do you have support at home? Yes   Functional Questionnaire: (I = Independent and D = Dependent) ADLs: I  Bathing/Dressing- I  Meal Prep- I  Eating- I  Maintaining continence- I  Transferring/Ambulation- I  Managing Meds- I   Follow up appointments reviewed:   PCP Hospital f/u appt confirmed? No    Specialist Hospital f/u appt confirmed? No  Pt stated that she is waiting on a call from Neuro for consultation.   Are transportation arrangements needed? No   If their condition worsens, is the pt aware to call PCP or go to the Emergency Dept.? Yes  Was the patient provided with contact information for the PCP's office or ED? Yes  Was to pt encouraged to call back with questions or concerns? Yes

## 2021-03-13 DIAGNOSIS — M5441 Lumbago with sciatica, right side: Secondary | ICD-10-CM | POA: Diagnosis not present

## 2021-03-18 DIAGNOSIS — E038 Other specified hypothyroidism: Secondary | ICD-10-CM | POA: Diagnosis not present

## 2021-03-18 DIAGNOSIS — E063 Autoimmune thyroiditis: Secondary | ICD-10-CM | POA: Diagnosis not present

## 2021-03-18 DIAGNOSIS — Z713 Dietary counseling and surveillance: Secondary | ICD-10-CM | POA: Diagnosis not present

## 2021-03-18 DIAGNOSIS — E8881 Metabolic syndrome: Secondary | ICD-10-CM | POA: Diagnosis not present

## 2021-03-18 DIAGNOSIS — E782 Mixed hyperlipidemia: Secondary | ICD-10-CM | POA: Diagnosis not present

## 2021-03-23 NOTE — Progress Notes (Signed)
Virtual Visit via Video Note  I connected with Linda [name] on 03/26/21 at  1:40 PM EDT by a video enabled telemedicine application and verified that I am speaking with the correct person using two identifiers.  Location: Patient: home Provider: office   I discussed the limitations of evaluation and management by telemedicine and the availability of in person appointments. The patient expressed understanding and agreed to proceed.    I discussed the assessment and treatment plan with the patient. The patient was provided an opportunity to ask questions and all were answered. The patient agreed with the plan and demonstrated an understanding of the instructions.   The patient was advised to call back or seek an in-person evaluation if the symptoms worsen or if the condition fails to improve as anticipated.  I provided 13 minutes of non-face-to-face time during this encounter.   Neysa Hottereina Marvia Troost, MD    [facility] OP Progress Note  03/26/2021 2:14 PM Linda [name]  MRN:  [id number]  Chief Complaint:  Chief Complaint    Follow-up; Depression; Anxiety     HPI:  -This is a follow-up appointment for depression and anxiety.  She states that she had significant panic attacks the next day after she missed to take venlafaxine.  Although she is usually good at taking medication consistently, she was very busy as her son was awake until 4 AM, crying at her son's birthday party.  She has worsening in anxiety since then, feeling that she is not in control of her body.  She also reports that she has been stressed due to her son's health condition.  He complained of leg pain, and was evaluated by a specialist.  They could not find a cause.  She also talks about her husband, who is transitioning his job for more income and closer to home.  She will have bariatric surgery in June, and feels excited about this.  She feels anxious and tense.  She has a fear of having another panic attack.  She has  worsening in and insomnia.  She feels depressed/wants to escape from this.  She denies SI.  She had to take Xanax when she had panic attacks, although she did not like the way it makes her feel.  She denies alcohol use or drug use.   Employment:works for [employer], used to work as Social workerhair stylist Support: Household: husband, son Marital status:married in 8/201. Divorced once.  Number of children:1 son [place]She reports great relationship with her parents.  274 lbs Wt Readings from Last 3 Encounters:  03/02/21 270 lb (122.5 kg)  03/01/21 270 lb (122.5 kg)  01/30/21 276 lb 3.2 oz (125.3 kg)    Visit Diagnosis:    ICD-10-CM   1. GAD (generalized anxiety disorder)  F41.1   2. MDD (major depressive disorder), recurrent episode, mild (HCC)  F33.0   3. Panic disorder  F41.0     Past Psychiatric History: Please see initial evaluation for full details. I have reviewed the history. No updates at this time.     Past Medical History:  Past Medical History:  Diagnosis Date  . Anxiety   . Asthma   . Heart murmur   . Hypothyroidism   . IBS (irritable bowel syndrome)   . Interstitial cystitis     Past Surgical History:  Procedure Laterality Date  . APPENDECTOMY    . CESAREAN SECTION N/A 03/20/2013   Procedure: CESAREAN SECTION;  Surgeon: Oliver PilaKathy W Richardson, MD;  Location: [facility];  Service: Obstetrics;  Laterality: N/A;  . CESAREAN SECTION    . LAPAROSCOPIC APPENDECTOMY N/A 10/16/2019   Procedure: APPENDECTOMY LAPAROSCOPIC;  Surgeon: Franky Macho, MD;  Location: [facility];  Service: General;  Laterality: N/A;  . MYRINGOTOMY WITH TUBE PLACEMENT    . NO PAST SURGERIES    . TYMPANOSTOMY TUBE PLACEMENT    . UPPER GI ENDOSCOPY      Family Psychiatric History: Please see initial evaluation for full details. I have reviewed the history. No updates at this time.     Family History:  Family History  Problem Relation Age of Onset  . Cancer Paternal Uncle   .  COPD Maternal Grandfather   . Stroke Paternal Grandfather   . Depression Mother   . OCD Mother   . Anxiety disorder Mother   . Drug abuse Maternal Uncle   . Depression Maternal Uncle   . Anxiety disorder Maternal Uncle   . Depression Maternal Grandmother   . Anxiety disorder Brother   . Dementia Paternal Grandmother     Social History:  Social History   Socioeconomic History  . Marital status: Married    Spouse name: Not on file  . Number of children: Not on file  . Years of education: Not on file  . Highest education level: Not on file  Occupational History  . Not on file  Tobacco Use  . Smoking status: Former Smoker    Packs/day: 1.00    Types: Cigarettes    Start date: 01/28/2013  . Smokeless tobacco: Never Used  Vaping Use  . Vaping Use: Former  Substance and Sexual Activity  . Alcohol use: No    Alcohol/week: 0.0 standard drinks  . Drug use: No  . Sexual activity: Yes    Partners: Male    Birth control/protection: None  Other Topics Concern  . Not on file  Social History Narrative   ** Merged History Encounter **       Social Determinants of Health   Financial Resource Strain: Not on file  Food Insecurity: Not on file  Transportation Needs: Not on file  Physical Activity: Not on file  Stress: Not on file  Social Connections: Not on file    Allergies:  Allergies  Allergen Reactions  . Ciprofloxacin Nausea Only  . Sulfa Antibiotics Diarrhea and Nausea And Vomiting  . Buspar [Buspirone] Anxiety  . Levaquin [Levofloxacin] Anxiety    Metabolic Disorder Labs: Lab Results  Component Value Date   HGBA1C 5.4 06/05/2020   No results found for: PROLACTIN No results found for: CHOL, TRIG, HDL, CHOLHDL, VLDL, LDLCALC Lab Results  Component Value Date   TSH 0.957 01/08/2021   TSH 28.86 (H) 07/26/2018    Therapeutic Level Labs: No results found for: LITHIUM No results found for: VALPROATE No components found for:  CBMZ  Current  Medications: Current Outpatient Medications  Medication Sig Dispense Refill  . gabapentin (NEURONTIN) 100 MG capsule Take 1 capsule (100 mg total) by mouth 3 (three) times daily. 90 capsule 1  . albuterol (VENTOLIN HFA) 108 (90 Base) MCG/ACT inhaler Inhale 2 puffs into the lungs every 4 (four) hours as needed for wheezing or shortness of breath. 1 each 1  . ALPRAZolam (XANAX) 1 MG tablet 0.5-1 mg daily as needed for anxiety 30 tablet 0  . fexofenadine (ALLEGRA) 180 MG tablet Take 180 mg by mouth daily as needed for allergies.     Marland Kitchen ibuprofen (ADVIL) 800 MG tablet Take 1 tablet (  800 mg total) by mouth 3 (three) times daily. 21 tablet 0  . levothyroxine (SYNTHROID) 200 MCG tablet Take 200 mcg by mouth daily before breakfast.    . methocarbamol (ROBAXIN) 500 MG tablet Take 1 tablet (500 mg total) by mouth 2 (two) times daily. 20 tablet 0  . Multiple Vitamin (MULTIVITAMIN WITH MINERALS) TABS tablet Take 1 tablet by mouth daily.    Marland Kitchen oxyCODONE-acetaminophen (PERCOCET/ROXICET) 5-325 MG tablet Take 1 tablet by mouth every 6 (six) hours as needed for severe pain. 6 tablet 0  . SELENIUM PO Take by mouth daily.    Marland Kitchen venlafaxine XR (EFFEXOR-XR) 150 MG 24 hr capsule 225 mg daily. Take along with 75 mg cap 30 capsule 1  . venlafaxine XR (EFFEXOR-XR) 75 MG 24 hr capsule 225 mg daily. Take along with 150 mg cap 30 capsule 1   Current Facility-Administered Medications  Medication Dose Route Frequency Provider Last Rate Last Admin  . diclofenac (VOLTAREN) EC tablet 75 mg  75 mg Oral BID Elson Areas, [place]         Musculoskeletal: Strength & Muscle Tone: N/A Gait & Station: N/A Patient leans: N/A  Psychiatric Specialty Exam: Review of Systems  Psychiatric/Behavioral: Positive for dysphoric mood and sleep disturbance. Negative for agitation, behavioral problems, confusion, decreased concentration, hallucinations, self-injury and suicidal ideas. The patient is nervous/anxious. The patient is not  hyperactive.   All other systems reviewed and are negative.   There were no vitals taken for this visit.There is no height or weight on file to calculate BMI.  General Appearance: Fairly Groomed  Eye Contact:  Good  Speech:  Clear and Coherent  Volume:  Normal  Mood:  Anxious  Affect:  Appropriate, Congruent and slightly tense/down  Thought Process:  Coherent  Orientation:  Full (Time, Place, and Person)  Thought Content: Logical   Suicidal Thoughts:  No  Homicidal Thoughts:  No  Memory:  Immediate;   Good  Judgement:  Good  Insight:  Good  Psychomotor Activity:  Normal  Concentration:  Concentration: Good and Attention Span: Good  Recall:  Good  Fund of Knowledge: Good  Language: Good  Akathisia:  No  Handed:  Right  AIMS (if indicated): not done  Assets:  Communication Skills Desire for Improvement  ADL's:  Intact  Cognition: WNL  Sleep:  Poor   Screenings: GAD-7   Flowsheet Row Counsel[facility] Visit from 03/07/2018 in [facility] Office Visit from 11/22/2017 in [facility]  Total GAD-7 Score 12 14 15     PHQ2-9   Flowsheet Row Video Visit [facility] 01/26/2021 in [facility] Counselor from 01/05/2021 [facility] Office Visit from 03/07/2018 in [facility]  PHQ-2 Total Score 1 1 5   PHQ-9 Total Score -- -- 66    Flowsheet Row ED from 03/02/2021 in [facility]PART[facility] ED from 03/01/2021 in [facility] EM[facility]GENC[facility]PART[facility] Video Visit from 01/26/2021 in [facility]  C-SSRS RISK CATEGORY No Risk No Risk No Risk       Assessment and Plan:  LATRICIA CERRITO is a 30 y.o. year old female with a history of depression, anxiety, ADHD,prediabetes, thyroid disease, who presents for follow up appointment for below.   1. GAD (generalized anxiety disorder) 2.  MDD (major depressive disorder), recurrent episode, mild (HCC) 3. Panic disorder She reports worsening in anxiety since the last visit in the context of her  son having medical evaluation for leg pain, financial strain, and her husband changing his job.  It also occurred in the context of her missing to take 1 dose of venlafaxine.  It is likely that her anxiety is surfacing more due to stress, although it may have been triggered by discontinuation symptoms.  Will add gabapentin to target anxiety especially given it has less impact on weight.  Discussed potential risk of drowsiness.  Will continue Xanax as needed for anxiety.  She is advised to have follow-up with Ms. Peggy for therapy.   Plan 1Continuevenlafaxine225mg  daily- monitor weight gain  2. Start gabapentin 100 mg three times a day 3.Continue Xanax 1 mg daily as needed for anxiety  4. Next appointmenton 6/13 at 4 PM for 30 mins, video - She sees Ms. Bynum for therapy - on hydrocodone, melatonin -sleep study in 2021- increased sleep latency, noOSA according to the patient  Last seen by PCP in 10/2020. fT4 wnl, LDL 119, HbA1c 6.2% on 10/2020  Past trials of medication:sertraline ("Zombie"),citalopram,bupsar (panic attacks),  lithium, Depakote, Abilify (visual impairment),xanax, lorazepam, clonazepam, adderall,Ritalin, Concerta,Trazodone (hypersomnia)  The patient demonstrates the following risk factors for suicide: Chronic risk factors for suicide include:psychiatric disorder ofanxiety. Acute risk factorsfor suicide include: N/A. Protective factorsfor this patient include: positive social support, responsibility to others (children, family), coping skills and hope for the future. Considering these factors, the overall suicide  Neysa Hotter, MD 03/26/2021, 2:14 PM

## 2021-03-24 ENCOUNTER — Other Ambulatory Visit: Payer: Self-pay

## 2021-03-24 ENCOUNTER — Ambulatory Visit (HOSPITAL_COMMUNITY)
Admission: RE | Admit: 2021-03-24 | Discharge: 2021-03-24 | Disposition: A | Discharge status: Home / Self Care | Payer: Medicaid Other | Source: Ambulatory Visit | Attending: Cardiology | Admitting: Cardiology

## 2021-03-24 DIAGNOSIS — R011 Cardiac murmur, unspecified: Secondary | ICD-10-CM | POA: Diagnosis not present

## 2021-03-24 DIAGNOSIS — I319 Disease of pericardium, unspecified: Secondary | ICD-10-CM | POA: Diagnosis not present

## 2021-03-24 DIAGNOSIS — R079 Chest pain, unspecified: Secondary | ICD-10-CM | POA: Diagnosis not present

## 2021-03-24 LAB — ECHOCARDIOGRAM COMPLETE
Area-P 1/2: 3.77 cm2
S' Lateral: 2.7 cm

## 2021-03-24 NOTE — Progress Notes (Signed)
Patient ID: Linda [name], female   DOB: December 23, 1990, 30 y.o.   MRN: [id number]  Echocardiogram 2D Echocardiogram has been performed.  Tye Savoy 03/24/21

## 2021-03-25 ENCOUNTER — Telehealth: Payer: Self-pay | Admitting: Cardiology

## 2021-03-25 NOTE — Telephone Encounter (Signed)
Code 97948 is not cover under manage medicaid, insurance company stated they need replacement code if there is one. When calling back you can say sent to medical director for review, or the replacement code or you can say get rid of the code. Please advise. When calling back the number is [phone] Ref # [id number]  You can ask for Melody but anybody can help

## 2021-03-25 NOTE — Telephone Encounter (Signed)
Sent message to charge corrections, Sent message to the coders, we don't see on pt's account that the code was ever used

## 2021-03-26 ENCOUNTER — Encounter: Payer: Self-pay | Admitting: Psychiatry

## 2021-03-26 ENCOUNTER — Telehealth (INDEPENDENT_AMBULATORY_CARE_PROVIDER_SITE_OTHER): Payer: Medicaid Other | Admitting: Psychiatry

## 2021-03-26 ENCOUNTER — Other Ambulatory Visit: Payer: Self-pay

## 2021-03-26 DIAGNOSIS — F411 Generalized anxiety disorder: Secondary | ICD-10-CM | POA: Diagnosis not present

## 2021-03-26 DIAGNOSIS — F410 Panic disorder [episodic paroxysmal anxiety] without agoraphobia: Secondary | ICD-10-CM

## 2021-03-26 DIAGNOSIS — F330 Major depressive disorder, recurrent, mild: Secondary | ICD-10-CM

## 2021-03-26 MED ORDER — GABAPENTIN 100 MG PO CAPS: 100.0000 mg | ORAL_CAPSULE | Freq: Three times a day (TID) | ORAL | 1 refills | Status: DC

## 2021-03-26 NOTE — Patient Instructions (Signed)
1Continuevenlafaxine225mg  daily- monitor weight gain  2. Start gabapentin 100 mg three times a day 3.Continue Xanax 1 mg daily as needed for anxiety  4. Next appointmenton 6/13 at 4 PM

## 2021-04-03 ENCOUNTER — Encounter: Payer: Self-pay | Admitting: *Deleted

## 2021-04-08 NOTE — Progress Notes (Signed)
Office Visit Note  Patient: Linda [name]             Date of Birth: 10-19-1991           MRN: [id number]             PCP: Nicholes Rough, PA-C Referring: Nicholes Rough, PA-C Visit Date: 04/16/2021 Occupation: @GUAROCC @  Subjective:  Pain in multiple joints and muscles.   History of Present Illness: Linda [name] is a 30 y.o. female seen in consultation per request of her PCP.  According the patient for the last 1 year she has been experiencing increased pain and discomfort in her joints and muscles.  She states the pain is worse before she goes to the bed and then she is very stiff in the morning recently she is having difficulty walking in the morning due to discomfort in her feet.  She complains of discomfort in her lower back, between her shoulders, elbows, wrist, hands, knees, ankles and her feet.  She has noticed swelling in her feet.  She also gives history of muscle pain between her shoulders.  She was diagnosed with interstitial cystitis at age 27.  She states her IC is fairly well controlled.  This family history of rheumatoid arthritis and fibromyalgia syndrome in her mother.  She is gravida 1, para 1, miscarriages 0.  Activities of Daily Living:  Patient reports morning stiffness for 1-2  hours.   Patient Reports nocturnal pain.  Difficulty dressing/grooming: Reports Difficulty climbing stairs: Reports Difficulty getting out of chair: Reports Difficulty using hands for taps, buttons, cutlery, and/or writing: Reports  Review of Systems  Constitutional: Positive for fatigue. Negative for night sweats, weight gain and weight loss.  HENT: Negative for mouth sores, trouble swallowing, trouble swallowing, mouth dryness and nose dryness.   Eyes: Negative for pain, redness, itching, visual disturbance and dryness.  Respiratory: Negative for cough, shortness of breath and difficulty breathing.   Cardiovascular: Positive for chest pain. Negative for palpitations, hypertension,  irregular heartbeat and swelling in legs/feet.  Gastrointestinal: Negative for blood in stool, constipation and diarrhea.  Endocrine: Negative for increased urination.  Genitourinary: Negative for difficulty urinating and vaginal dryness.  Musculoskeletal: Positive for arthralgias, joint pain, myalgias, morning stiffness and myalgias. Negative for joint swelling, muscle weakness and muscle tenderness.  Skin: Negative for color change, rash, hair loss, redness, skin tightness, ulcers and sensitivity to sunlight.  Allergic/Immunologic: Negative for susceptible to infections.  Neurological: Positive for dizziness and headaches. Negative for numbness, memory loss, night sweats and weakness.  Hematological: Negative for bruising/bleeding tendency and swollen glands.  Psychiatric/Behavioral: Positive for depressed mood and sleep disturbance. Negative for confusion. The patient is nervous/anxious.     PMFS History:  Patient Active Problem List   Diagnosis Date Noted  . Hidradenitis suppurativa of left axilla 07/10/2020  . Hepatomegaly 03/16/2020  . Hepatic steatosis 03/16/2020  . S/P laparoscopic appendectomy 10/16/2019  . Acute appendicitis, uncomplicated   . Ovarian cyst, right   . Mood disorder in conditions classified elsewhere 04/06/2018  . Generalized anxiety disorder 04/06/2018  . Hypothyroidism 06/21/2014  . Interstitial cystitis 06/14/2014  . 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 Morbid obesity ([place]) 02/07/2014  . Asthma, chronic 04/15/2013  . Chronic anxiety 04/15/2013  . Cervical strain 03/07/2012  . Sprain and strain of unspecified site of shoulder and upper arm 03/07/2012  . Pain in joint, shoulder region 03/07/2012    Past Medical History:  Diagnosis Date  . Anxiety   .  Asthma   . Heart murmur   . Hypothyroidism   . IBS (irritable bowel syndrome)   . Interstitial cystitis     Family History  Problem Relation Age of Onset  . Cancer  Paternal Uncle   . COPD Maternal Grandfather   . Stroke Paternal Grandfather   . Depression Mother   . OCD Mother   . Anxiety disorder Mother   . Rheum arthritis Mother   . Fibromyalgia Mother   . Drug abuse Maternal Uncle   . Depression Maternal Uncle   . Anxiety disorder Maternal Uncle   . Depression Maternal Grandmother   . Anxiety disorder Brother   . Healthy Brother   . Dementia Paternal Grandmother   . Autoimmune disease Father   . ADD / ADHD Son   . Healthy Son    Past Surgical History:  Procedure Laterality Date  . APPENDECTOMY    . CESAREAN SECTION N/A 03/20/2013   Procedure: CESAREAN SECTION;  Surgeon: Logan Bores, MD;  Location: [facility];  Service: Obstetrics;  Laterality: N/A;  . CESAREAN SECTION    . LAPAROSCOPIC APPENDECTOMY N/A 10/16/2019   Procedure: APPENDECTOMY LAPAROSCOPIC;  Surgeon: Aviva Signs, MD;  Location: [facility];  Service: General;  Laterality: N/A;  . MYRINGOTOMY WITH TUBE PLACEMENT    . NO PAST SURGERIES    . TYMPANOSTOMY TUBE PLACEMENT    . UPPER GI ENDOSCOPY     Social History   Social History Narrative   ** Merged History Encounter **       Immunization History  Administered Date(s) Administered  . Influenza Split 09/27/2013  . Influenza,inj,Quad PF,6+ Mos 11/24/2016, 11/04/2017, 10/17/2019  . Influenza-Unspecified 09/26/2014, 11/05/2015  . Meningococcal Conjugate 05/03/2007  . Pneumococcal Polysaccharide-23 03/22/2013  . Tdap 03/22/2013     Objective: Vital Signs: BP 116/80 (BP Location: Right Arm, Patient Position: Sitting, Cuff Size: Large)   Pulse 76   Resp 16   Ht 5' 6.5" (1.689 m)   Wt 278 lb 6.4 oz (126.3 kg)   BMI 44.26 kg/m    Physical Exam Vitals and nursing note reviewed.  Constitutional:      Appearance: She is well-developed.  HENT:     Head: Normocephalic and atraumatic.  Eyes:     Conjunctiva/sclera: Conjunctivae normal.  Cardiovascular:     Rate and Rhythm: Normal rate and regular rhythm.     Heart  sounds: Normal heart sounds.  Pulmonary:     Effort: Pulmonary effort is normal.     Breath sounds: Normal breath sounds.  Abdominal:     General: Bowel sounds are normal.     Palpations: Abdomen is soft.  Musculoskeletal:     Cervical back: Normal range of motion.  Lymphadenopathy:     Cervical: No cervical adenopathy.  Skin:    General: Skin is warm and dry.     Capillary Refill: Capillary refill takes less than 2 seconds.  Neurological:     Mental Status: She is alert and oriented to person, place, and time.  Psychiatric:        Behavior: Behavior normal.      Musculoskeletal Exam: C-spine was in good range of motion.  Mobility in thoracic and lumbar spine was difficult to judge due to body habitus.  Shoulder joints, elbow joints, wrist joints, MCPs PIPs and DIPs with good range of motion with no synovitis.  Hip joints, knee joints, ankles, MTPs and PIPs with good range of motion with no synovitis.  She had discomfort with  range of motion of her knee joints, some tenderness over ankle joints.  CDAI Exam: CDAI Score: -- Patient Global: --; Provider Global: -- Swollen: --; Tender: -- Joint Exam 04/16/2021   No joint exam has been documented for this visit   There is currently no information documented on the homunculus. Go to the Rheumatology activity and complete the homunculus joint exam.  Investigation: No additional findings.  Imaging: ECHOCARDIOGRAM COMPLETE  Result Date: 03/24/2021    ECHOCARDIOGRAM REPORT   Patient Name:   SACHA TOPOR Date of Exam: 03/24/2021 Medical Rec #:  [id number]         Height:       65.5 in Accession #:    [id number]        Weight:       270.0 lb Date of Birth:  [birth year]         BSA:          2.260 m Patient Age:    30 years          BP:           122/78 mmHg Patient Gender: F                 HR:           84 bpm. Exam Location:  Outpatient Procedure: 2D Echo and Strain Analysis Indications:    Pericarditis  History:        Patient has no  prior history of Echocardiogram examinations.                 Signs/Symptoms:Chest Pain and Murmur.  Sonographer:    Mikki Santee RDCS Referring Phys: [id number] [place]  1. Left ventricular ejection fraction, by estimation, is 65 to 70%. The left ventricle has normal function. The left ventricle has no regional wall motion abnormalities. Left ventricular diastolic parameters were normal. The average left ventricular global longitudinal strain is -20.7 %. The global longitudinal strain is normal.  2. Right ventricular systolic function is normal. The right ventricular size is normal. There is normal pulmonary artery systolic pressure.  3. The mitral valve is normal in structure. Trivial mitral valve regurgitation. No evidence of mitral stenosis.  4. The aortic valve is normal in structure. Aortic valve regurgitation is not visualized. No aortic stenosis is present.  5. The inferior vena cava is normal in size with greater than 50% respiratory variability, suggesting right atrial pressure of 3 mmHg. FINDINGS  Left Ventricle: Left ventricular ejection fraction, by estimation, is 65 to 70%. The left ventricle has normal function. The left ventricle has no regional wall motion abnormalities. The average left ventricular global longitudinal strain is -20.7 %. The global longitudinal strain is normal. The left ventricular internal cavity size was normal in size. There is no left ventricular hypertrophy. Left ventricular diastolic parameters were normal. Normal left ventricular filling pressure. Right Ventricle: The right ventricular size is normal. No increase in right ventricular wall thickness. Right ventricular systolic function is normal. There is normal pulmonary artery systolic pressure. The tricuspid regurgitant velocity is 1.37 m/s, and  with an assumed right atrial pressure of 3 mmHg, the estimated right ventricular systolic pressure is 51.7 mmHg. Left Atrium: Left atrial size was normal  in size. Right Atrium: Right atrial size was normal in size. Pericardium: There is no evidence of pericardial effusion. Mitral Valve: The mitral valve is normal in structure. Trivial mitral valve regurgitation. No evidence of mitral valve stenosis. Tricuspid Valve: The  tricuspid valve is normal in structure. Tricuspid valve regurgitation is trivial. No evidence of tricuspid stenosis. Aortic Valve: The aortic valve is normal in structure. Aortic valve regurgitation is not visualized. No aortic stenosis is present. Pulmonic Valve: The pulmonic valve was normal in structure. Pulmonic valve regurgitation is not visualized. No evidence of pulmonic stenosis. Aorta: The aortic root is normal in size and structure. Venous: The inferior vena cava is normal in size with greater than 50% respiratory variability, suggesting right atrial pressure of 3 mmHg. IAS/Shunts: No atrial level shunt detected by color flow Doppler.  LEFT VENTRICLE PLAX 2D LVIDd:         4.40 cm  Diastology LVIDs:         2.70 cm  LV e' medial:    10.10 cm/s LV PW:         1.00 cm  LV E/e' medial:  7.9 LV IVS:        1.00 cm  LV e' lateral:   11.70 cm/s LVOT diam:     2.10 cm  LV E/e' lateral: 6.8 LV SV:         64 LV SV Index:   28       2D Longitudinal Strain LVOT Area:     3.46 cm 2D Strain GLS (A2C):   -18.9 %                         2D Strain GLS (A3C):   -20.5 %                         2D Strain GLS (A4C):   -22.7 %                         2D Strain GLS Avg:     -20.7 % RIGHT VENTRICLE RV S prime:     12.10 cm/s TAPSE (M-mode): 2.0 cm LEFT ATRIUM           Index       RIGHT ATRIUM           Index LA diam:      2.80 cm 1.24 cm/m  RA Area:     11.20 cm LA Vol (A2C): 32.6 ml 14.42 ml/m RA Volume:   25.60 ml  11.33 ml/m LA Vol (A4C): 29.0 ml 12.83 ml/m  AORTIC VALVE LVOT Vmax:   94.20 cm/s LVOT Vmean:  65.800 cm/s LVOT VTI:    0.184 m  AORTA Ao Root diam: 2.70 cm Ao Asc diam:  2.90 cm MITRAL VALVE               TRICUSPID VALVE MV Area (PHT): 3.77  cm    TR Peak grad:   7.5 mmHg MV Decel Time: 201 msec    TR Vmax:        137.00 cm/s MV E velocity: 79.70 cm/s MV A velocity: 61.70 cm/s  SHUNTS MV E/A ratio:  1.29        Systemic VTI:  0.18 m                            Systemic Diam: 2.10 cm Chilton Si MD Electronically signed by Chilton Si MD Signature Date/Time: 03/24/2021/3:19:16 PM    Final     Recent Labs: Lab Results  Component Value Date   WBC 11.2 (H) 01/08/2021  HGB 12.7 01/08/2021   PLT 341 01/08/2021   NA 137 01/08/2021   K 3.6 01/08/2021   CL 103 01/08/2021   CO2 25 01/08/2021   GLUCOSE 99 01/08/2021   BUN 9 01/08/2021   CREATININE 0.69 01/08/2021   BILITOT 0.4 10/16/2019   ALKPHOS 99 10/16/2019   AST 19 10/16/2019   ALT 24 10/16/2019   PROT 8.4 (H) 10/16/2019   ALBUMIN 4.4 10/16/2019   CALCIUM 9.1 01/08/2021   GFRAA >60 12/30/2019    Speciality Comments: No specialty comments available.  Procedures:  No procedures performed Allergies: Ciprofloxacin, Sulfa antibiotics, Buspar [buspirone], and Levaquin [levofloxacin]   Assessment / Plan:     Visit Diagnoses: Polyarthralgia -she complains of pain and discomfort in multiple joints.  No synovitis was noted.  All autoimmune work-up has been negative and sed rate is normal.  02/16/21: uric acid 6.1, ANA negative, RF<10, ESR 12, Mg 1.8, CK 64  Myalgia-she complains of generalized muscle pain.  She also had some hyperalgesia.  There is positive family history of fibromyalgia in her mother.  She may have a component of myofascial pain syndrome.  Need for regular exercise, water aerobics and swimming was emphasized.  Primary insomnia-she has history of chronic insomnia.  Other fatigue-she has history of generalized fatigue.  Pain in both hands -she complains of pain and discomfort in her bilateral hands.  No synovitis was noted.  I will obtain anti-CCP and 14 3 3  eta antibodies.  I will also schedule ultrasound of her bilateral hands to evaluate this further.   Plan: XR Hand 2 View Right, XR Hand 2 View Left.  X-ray of bilateral hands were unremarkable.  Chronic pain of both knees -she complains of discomfort in her bilateral knees.  No warmth swelling or effusion was noted.  Plan: XR KNEE 3 VIEW RIGHT, XR KNEE 3 VIEW LEFT.  X-ray bilateral knee joints were unremarkable.  Pain in both feet -she complains of discomfort in her bilateral ankles and feet.  No synovitis was noted.  Plan: XR Foot 2 Views Right, XR Foot 2 Views Left.  X-ray of bilateral feet were unremarkable except for calcaneal spurs.  DDD (degenerative disc disease), lumbar -she complains of lower back pain.  I reviewed CT scan results and discussed with the patient.  L4-L5 disc narrowing, disc protrusion and facet joint arthritis noted on the CT scan March 02, 2021  Other medical problems are listed as follows:  Hidradenitis suppurativa of left axilla  Family history of rheumatoid arthritis - mother  Hashimoto's disease  Vitamin B12 deficiency  Vitamin D deficiency  Nonalcoholic fatty liver disease  Hepatomegaly  History of asthma  IC (interstitial cystitis)  Generalized anxiety disorder  ADD (attention deficit disorder) without hyperactivity  History of migraine  Orders: Orders Placed This Encounter  Procedures  . XR Hand 2 View Right  . XR Hand 2 View Left  . XR Foot 2 Views Right  . XR Foot 2 Views Left  . XR KNEE 3 VIEW RIGHT  . XR KNEE 3 VIEW LEFT  . Cyclic citrul peptide antibody, IgG  . 14-3-3 eta Protein   No orders of the defined types were placed in this encounter.   .  Follow-Up Instructions: Return for arthralgia.   March 04, 2021, MD  Note - This record has been created using Pollyann Savoy.  Chart creation errors have been sought, but may not always  have been located. Such creation errors do not reflect on  the standard of  medical care.

## 2021-04-16 ENCOUNTER — Ambulatory Visit: Payer: Self-pay

## 2021-04-16 ENCOUNTER — Encounter: Payer: Self-pay | Admitting: Rheumatology

## 2021-04-16 ENCOUNTER — Ambulatory Visit: Payer: Medicaid Other | Admitting: Rheumatology

## 2021-04-16 ENCOUNTER — Other Ambulatory Visit: Payer: Self-pay

## 2021-04-16 VITALS — BP 116/80 | HR 76 | Resp 16 | Ht 66.5 in | Wt 278.4 lb

## 2021-04-16 DIAGNOSIS — M79671 Pain in right foot: Secondary | ICD-10-CM

## 2021-04-16 DIAGNOSIS — L732 Hidradenitis suppurativa: Secondary | ICD-10-CM | POA: Diagnosis not present

## 2021-04-16 DIAGNOSIS — M79672 Pain in left foot: Secondary | ICD-10-CM

## 2021-04-16 DIAGNOSIS — G8929 Other chronic pain: Secondary | ICD-10-CM

## 2021-04-16 DIAGNOSIS — F5101 Primary insomnia: Secondary | ICD-10-CM | POA: Diagnosis not present

## 2021-04-16 DIAGNOSIS — M5136 Other intervertebral disc degeneration, lumbar region: Secondary | ICD-10-CM

## 2021-04-16 DIAGNOSIS — Z8261 Family history of arthritis: Secondary | ICD-10-CM | POA: Diagnosis not present

## 2021-04-16 DIAGNOSIS — Z8669 Personal history of other diseases of the nervous system and sense organs: Secondary | ICD-10-CM

## 2021-04-16 DIAGNOSIS — M25561 Pain in right knee: Secondary | ICD-10-CM | POA: Diagnosis not present

## 2021-04-16 DIAGNOSIS — R160 Hepatomegaly, not elsewhere classified: Secondary | ICD-10-CM

## 2021-04-16 DIAGNOSIS — E063 Autoimmune thyroiditis: Secondary | ICD-10-CM | POA: Diagnosis not present

## 2021-04-16 DIAGNOSIS — F411 Generalized anxiety disorder: Secondary | ICD-10-CM

## 2021-04-16 DIAGNOSIS — Z8709 Personal history of other diseases of the respiratory system: Secondary | ICD-10-CM

## 2021-04-16 DIAGNOSIS — M791 Myalgia, unspecified site: Secondary | ICD-10-CM | POA: Diagnosis not present

## 2021-04-16 DIAGNOSIS — E559 Vitamin D deficiency, unspecified: Secondary | ICD-10-CM

## 2021-04-16 DIAGNOSIS — E538 Deficiency of other specified B group vitamins: Secondary | ICD-10-CM

## 2021-04-16 DIAGNOSIS — M79641 Pain in right hand: Secondary | ICD-10-CM

## 2021-04-16 DIAGNOSIS — M2550 Pain in unspecified joint: Secondary | ICD-10-CM

## 2021-04-16 DIAGNOSIS — Z9049 Acquired absence of other specified parts of digestive tract: Secondary | ICD-10-CM

## 2021-04-16 DIAGNOSIS — R5383 Other fatigue: Secondary | ICD-10-CM

## 2021-04-16 DIAGNOSIS — M25562 Pain in left knee: Secondary | ICD-10-CM

## 2021-04-16 DIAGNOSIS — K760 Fatty (change of) liver, not elsewhere classified: Secondary | ICD-10-CM

## 2021-04-16 DIAGNOSIS — M79642 Pain in left hand: Secondary | ICD-10-CM

## 2021-04-16 DIAGNOSIS — F988 Other specified behavioral and emotional disorders with onset usually occurring in childhood and adolescence: Secondary | ICD-10-CM

## 2021-04-16 DIAGNOSIS — N3010 Interstitial cystitis (chronic) without hematuria: Secondary | ICD-10-CM

## 2021-04-17 NOTE — Progress Notes (Signed)
Virtual Visit via Video Note  I connected with Linda [name] on 04/27/21 at  4:00 PM EDT by a video enabled telemedicine application and verified that I am speaking with the correct [name] using two identifiers.  Location: Patient: home Provider: office Persons participated in the visit- patient, provider    I discussed the limitations of evaluation and management by telemedicine and the availability of in [name] appointments. The patient expressed understanding and agreed to proceed.   I discussed the assessment and treatment plan with the patient. The patient was provided an opportunity to ask questions and all were answered. The patient agreed with the plan and demonstrated an understanding of the instructions.   The patient was advised to call back or seek an in-[name] evaluation if the symptoms worsen or if the condition fails to improve as anticipated.  I provided 10 minutes of non-face-to-face time during this encounter.   Linda Hotter, MD    [facility] MD/PA/NP OP Progress Note  04/27/2021 4:26 PM Linda [name]  MRN:  [id number]  Chief Complaint:  Chief Complaint   Follow-up; Depression; Anxiety    HPI:  - She was seen by Rheumatology. R/o myofascial pain syndrome.  This is a follow-up appointment for depression and anxiety.  She states that she could not continue gabapentin as she was drowsy from the medication, and it did not help her anxiety.  She was taking 0.5 mg Xanax, and has been doing well.  Although she feels anxious at times, it is much better compared to before.  Her son is doing better, and she reports good relationship with him.  She enjoys her current work, and works from home.  [place], and denies feeling depressed or anhedonia.  She denies SI.  She denies alcohol use or drug use. She has not contacted Ms. Bynum as she noticed the office was closed on Friday afternoon.  She is willing to make a follow-up appointment.    Employment:  works for [employer],  used to work as Social worker Support: Household: husband, son Marital status: married in 8/201.  Divorced once. Number of children: 1 son She grew up in [place]. She reports great relationship with her parents.   Visit Diagnosis:    ICD-10-CM   1. GAD (generalized anxiety disorder)  F41.1     2. MDD (major depressive disorder), recurrent, in partial remission (HCC)  F33.41     3. Panic disorder  F41.0       Past Psychiatric History: Please see initial evaluation for full details. I have reviewed the history. No updates at this time.     Past Medical History:  Past Medical History:  Diagnosis Date   Anxiety    Asthma    Heart murmur    Hypothyroidism    IBS (irritable bowel syndrome)    Interstitial cystitis     Past Surgical History:  Procedure Laterality Date   APPENDECTOMY     CESAREAN SECTION N/A 03/20/2013   Procedure: CESAREAN SECTION;  Surgeon: [name], MD;  Location: [facility];  Service: Obstetrics;  Laterality: N/A;   CESAREAN SECTION     LAPAROSCOPIC APPENDECTOMY N/A 10/16/2019   Procedure: APPENDECTOMY LAPAROSCOPIC;  Surgeon: Franky Macho, MD;  Location: [facility];  Service: General;  Laterality: N/A;   MYRINGOTOMY WITH TUBE PLACEMENT     NO PAST SURGERIES     TYMPANOSTOMY TUBE PLACEMENT     UPPER GI ENDOSCOPY      Family  Psychiatric History: Please see initial evaluation for full details. I have reviewed the history. No updates at this time.     Family History:  Family History  Problem Relation Age of Onset   Cancer Paternal Uncle    COPD Maternal Grandfather    Stroke Paternal Grandfather    Depression Mother    OCD Mother    Anxiety disorder Mother    Rheum arthritis Mother    Fibromyalgia Mother    Drug abuse Maternal Uncle    Depression Maternal Uncle    Anxiety disorder Maternal Uncle    Depression Maternal Grandmother    Anxiety disorder Brother    Healthy Brother    Dementia Paternal Grandmother     Autoimmune disease Father    ADD / ADHD Son    Healthy Son     Social History:  Social History   Socioeconomic History   Marital status: Married    Spouse name: Not on file   Number of children: Not on file   Years of education: Not on file   Highest education level: Not on file  Occupational History   Not on file  Tobacco Use   Smoking status: Former    Packs/day: 1.00    Pack years: 0.00    Types: Cigarettes    Start date: 03/15/2021   Smokeless tobacco: Never  Vaping Use   Vaping Use: Former  Substance and Sexual Activity   Alcohol use: Yes    Comment: occ   Drug use: No   Sexual activity: Yes    Partners: Male    Birth control/protection: None  Other Topics Concern   Not on file  Social History Narrative   ** Merged History Encounter **       Social Determinants of Health   Financial Resource Strain: Not on file  Food Insecurity: Not on file  Transportation Needs: Not on file  Physical Activity: Not on file  Stress: Not on file  Social Connections: Not on file    Allergies:  Allergies  Allergen Reactions   Ciprofloxacin Nausea Only   Sulfa Antibiotics Diarrhea and Nausea And Vomiting   Buspar [Buspirone] Anxiety   Levaquin [Levofloxacin] Anxiety    Metabolic Disorder Labs: Lab Results  Component Value Date   HGBA1C 5.4 06/05/2020   No results found for: PROLACTIN No results found for: CHOL, TRIG, HDL, CHOLHDL, VLDL, LDLCALC Lab Results  Component Value Date   TSH 0.957 01/08/2021   TSH 28.86 (H) 07/26/2018    Therapeutic Level Labs: No results found for: LITHIUM No results found for: VALPROATE No components found for:  CBMZ  Current Medications: Current Outpatient Medications  Medication Sig Dispense Refill   ALPRAZolam (XANAX) 0.5 MG tablet Take 1 tablet (0.5 mg total) by mouth at bedtime as needed for anxiety. 30 tablet 1   albuterol (VENTOLIN HFA) 108 (90 Base) MCG/ACT inhaler Inhale 2 puffs into the lungs every 4 (four) hours as  needed for wheezing or shortness of breath. 1 each 1   fexofenadine (ALLEGRA) 180 MG tablet Take 180 mg by mouth daily as needed for allergies.      gabapentin (NEURONTIN) 100 MG capsule Take 1 capsule (100 mg total) by mouth 3 (three) times daily. (Patient taking differently: Take 100 mg by mouth as needed.) 90 capsule 1   ibuprofen (ADVIL) 800 MG tablet Take 1 tablet (800 mg total) by mouth 3 (three) times daily. (Patient taking differently: Take 800 mg by mouth as needed.) 21 tablet  0   levothyroxine (SYNTHROID) 200 MCG tablet Take 200 mcg by mouth daily before breakfast.     Multiple Vitamin (MULTIVITAMIN WITH MINERALS) TABS tablet Take 1 tablet by mouth daily.     SELENIUM PO Take by mouth daily.     venlafaxine XR (EFFEXOR-XR) 150 MG 24 hr capsule 225 mg daily. Take along with 75 mg cap 90 capsule 1   venlafaxine XR (EFFEXOR-XR) 75 MG 24 hr capsule 225 mg daily. Take along with 150 mg cap 90 capsule 1   Current Facility-Administered Medications  Medication Dose Route Frequency Provider Last Rate Last Admin   diclofenac (VOLTAREN) EC tablet 75 mg  75 mg Oral BID Elson Areas, [place]         Musculoskeletal: Strength & Muscle Tone:  N/A Gait & Station:  N/A Patient leans: N/A  Psychiatric Specialty Exam: Review of Systems  Psychiatric/Behavioral:  Negative for agitation, behavioral problems, confusion, decreased concentration, dysphoric mood, hallucinations, self-injury, sleep disturbance and suicidal ideas. The patient is nervous/anxious. The patient is not hyperactive.   All other systems reviewed and are negative.  There were no vitals taken for this visit.There is no height or weight on file to calculate BMI.  General Appearance: Fairly Groomed  Eye Contact:  Good  Speech:  Clear and Coherent  Volume:  Normal  Mood:   better  Affect:  Appropriate, Congruent, and euthymic  Thought Process:  Coherent  Orientation:  Full (Time, Place, and [name])  Thought Content: Logical    Suicidal Thoughts:  No  Homicidal Thoughts:  No  Memory:  Immediate;   Good  Judgement:  Good  Insight:  Fair  Psychomotor Activity:  Normal  Concentration:  Concentration: Good and Attention Span: Good  Recall:  Good  Fund of Knowledge: Good  Language: Good  Akathisia:  No  Handed:  Right  AIMS (if indicated): not done  Assets:  Communication Skills Desire for Improvement  ADL's:  Intact  Cognition: WNL  Sleep:  Good   Screenings: GAD-7    Flowsheet Row Counselor from [facility]05[facility]20[facility] [facility] Visit from 03/07/2018 in [facility] Office Visit from 11/22/2017 in [facility]  Total GAD-7 Score 12 14 15       PHQ2-9    Flowsheet Row Video Visit from 04/27/2021 in [facility] Video Visit from 01/26/2021 in [facility] Counselor from 01/05/2021 in [facility] Office Visit from 03/07/2018 in [facility]  PHQ-2 Total Score 0 1 1 5   PHQ-9 Total Score -- -- -- 18      Flowsheet Row Video Visit from 04/27/2021 in [facility] ED from 03/02/2021 in [facility] ED from 03/01/2021 in [facility]GENCY DEPARTMENT  C-SSRS RISK CATEGORY No Risk No Risk No Risk        Assessment and Plan:  Linda [name] is a 30 y.o. year old female with a history of depression, anxiety, ADHD, prediabetes, thyroid disease, who presents for follow up appointment for below.   1. GAD (generalized anxiety disorder) 2. MDD (major depressive disorder), recurrent, in partial remission (HCC) 3. Panic disorder She reports overall improvement in anxiety since she self restarted lower dose of Xanax in the morning.  Psychosocial stressors includes her son with medical condition of leg pain, financial strain, and her husband changing his job.  She could not tolerate gabapentin  due to drowsiness.  Will continue venlafaxine to target  depression and anxiety.  Will continue Xanax as needed for anxiety.  Discussed risk of dependence and oversedation especially with concomitant use of opioid.  Will discontinue gabapentin due to adverse reaction.  She is advised again to contact Ms. Bynum for therapy.    Plan 1  Continue venlafaxine 225 mg daily- monitor weight gain  2.  Discontinue gabapentin  3.  Start Xanax 0.5 mg daily as needed for anxiety 4.  Next appointment-August 11 at 140 for 20 minutes, video.  - on hydrocodone, melatonin - sleep study in 2021- increased sleep latency, no OSA according to the patient   Last seen by PCP in 10/2020. fT4 wnl, LDL 119, HbA1c 6.2% on 10/2020    Past trials of medication: sertraline ("Zombie"), citalopram,bupsar (panic attacks),  gabapentin (drowsiness), lithium, Depakote, Abilify (visual impairment), xanax, lorazepam, clonazepam, adderall, Ritalin, Concerta, Trazodone (hypersomnia)   The patient demonstrates the following risk factors for suicide: Chronic risk factors for suicide include: psychiatric disorder of anxiety. Acute risk factors for suicide include: N/A. Protective factors for this patient include: positive social support, responsibility to others (children, family), coping skills and hope for the future. Considering these factors, the overall suicide   Linda Hottereina Malajah Oceguera, MD 04/27/2021, 4:26 PM

## 2021-04-20 LAB — 14-3-3 ETA PROTEIN: 14-3-3 eta Protein: <0.2 ng/mL (ref <0.2)

## 2021-04-20 LAB — CYCLIC CITRUL PEPTIDE ANTIBODY, IGG: Cyclic Citrullin Peptide Ab: <16 UNITS

## 2021-04-21 ENCOUNTER — Encounter: Payer: Self-pay | Admitting: *Deleted

## 2021-04-21 NOTE — Progress Notes (Signed)
Anti-CCP and 14 3 3  eta antibodies which is specific for rheumatoid arthritis are both negative.

## 2021-04-23 DIAGNOSIS — M5416 Radiculopathy, lumbar region: Secondary | ICD-10-CM | POA: Diagnosis not present

## 2021-04-23 DIAGNOSIS — M792 Neuralgia and neuritis, unspecified: Secondary | ICD-10-CM | POA: Diagnosis not present

## 2021-04-27 ENCOUNTER — Other Ambulatory Visit: Payer: Self-pay

## 2021-04-27 ENCOUNTER — Encounter: Payer: Self-pay | Admitting: Psychiatry

## 2021-04-27 ENCOUNTER — Telehealth (INDEPENDENT_AMBULATORY_CARE_PROVIDER_SITE_OTHER): Payer: Medicaid Other | Admitting: Psychiatry

## 2021-04-27 DIAGNOSIS — F3341 Major depressive disorder, recurrent, in partial remission: Secondary | ICD-10-CM

## 2021-04-27 DIAGNOSIS — F410 Panic disorder [episodic paroxysmal anxiety] without agoraphobia: Secondary | ICD-10-CM | POA: Diagnosis not present

## 2021-04-27 DIAGNOSIS — F411 Generalized anxiety disorder: Secondary | ICD-10-CM

## 2021-04-27 MED ORDER — VENLAFAXINE HCL ER 150 MG PO CP24: ORAL_CAPSULE | ORAL | 1 refills | Status: DC

## 2021-04-27 MED ORDER — VENLAFAXINE HCL ER 75 MG PO CP24: ORAL_CAPSULE | ORAL | 1 refills | Status: DC

## 2021-04-27 MED ORDER — ALPRAZOLAM 0.5 MG PO TABS: 0.5000 mg | ORAL_TABLET | Freq: Every evening | ORAL | 1 refills | Status: DC | PRN

## 2021-04-29 ENCOUNTER — Other Ambulatory Visit: Payer: Self-pay

## 2021-04-29 ENCOUNTER — Ambulatory Visit (INDEPENDENT_AMBULATORY_CARE_PROVIDER_SITE_OTHER): Payer: Medicaid Other | Admitting: Rheumatology

## 2021-04-29 ENCOUNTER — Ambulatory Visit: Payer: Self-pay

## 2021-04-29 DIAGNOSIS — M79642 Pain in left hand: Secondary | ICD-10-CM | POA: Diagnosis not present

## 2021-04-29 DIAGNOSIS — M79641 Pain in right hand: Secondary | ICD-10-CM | POA: Diagnosis not present

## 2021-05-04 NOTE — Progress Notes (Deleted)
Office Visit Note  Patient: Linda [name]             Date of Birth: [birth year]           MRN: [id number]             PCP: Nicholes Rough, PA-C Referring: Nicholes Rough, PA-C Visit Date: 05/14/2021 Occupation: _0 @  Subjective:  No chief complaint on file.   History of Present Illness: Linda [name] is a 30 y.o. female ***   Activities of Daily Living:  Patient reports morning stiffness for *** {minute/hour:19697}.   Patient {ACTIONS;DENIES/REPORTS:21021675::"Denies"} nocturnal pain.  Difficulty dressing/grooming: {ACTIONS;DENIES/REPORTS:21021675::"Denies"} Difficulty climbing stairs: {ACTIONS;DENIES/REPORTS:21021675::"Denies"} Difficulty getting out of chair: {ACTIONS;DENIES/REPORTS:21021675::"Denies"} Difficulty using hands for taps, buttons, cutlery, and/or writing: {ACTIONS;DENIES/REPORTS:21021675::"Denies"}  No Rheumatology ROS completed.   PMFS History:  Patient Active Problem List   Diagnosis Date Noted   Hidradenitis suppurativa of left axilla 07/10/2020   Hepatomegaly 03/16/2020   Hepatic steatosis 03/16/2020   S/P laparoscopic appendectomy 10/16/2019   Acute appendicitis, uncomplicated    Ovarian cyst, right    Mood disorder in conditions classified elsewhere 04/06/2018   Generalized anxiety disorder 04/06/2018   Hypothyroidism 06/21/2014   Interstitial cystitis 06/14/2014   ADD (attention deficit disorder) without hyperactivity 06/14/2014   Morbid obesity ([place]) 02/07/2014   Asthma, chronic 04/15/2013   Chronic anxiety 04/15/2013   Cervical strain 03/07/2012   Sprain and strain of unspecified site of shoulder and upper arm 03/07/2012   Pain in joint, shoulder region 03/07/2012    Past Medical History:  Diagnosis Date   Anxiety    Asthma    Heart murmur    Hypothyroidism    IBS (irritable bowel syndrome)    Interstitial cystitis     Family History  Problem Relation Age of Onset   Cancer Paternal Uncle    COPD Maternal Grandfather     Stroke Paternal Grandfather    Depression Mother    OCD Mother    Anxiety disorder Mother    Rheum arthritis Mother    Fibromyalgia Mother    Drug abuse Maternal Uncle    Depression Maternal Uncle    Anxiety disorder Maternal Uncle    Depression Maternal Grandmother    Anxiety disorder Brother    Healthy Brother    Dementia Paternal Grandmother    Autoimmune disease Father    ADD / ADHD Son    Healthy Son    Past Surgical History:  Procedure Laterality Date   APPENDECTOMY     CESAREAN SECTION N/A 03/20/2013   Procedure: CESAREAN SECTION;  Surgeon: Logan Bores, MD;  Location: [place];  Service: Obstetrics;  Laterality: N/A;   CESAREAN SECTION     LAPAROSCOPIC APPENDECTOMY N/A 10/16/2019   Procedure: APPENDECTOMY LAPAROSCOPIC;  Surgeon: Aviva Signs, MD;  Location: [facility];  Service: General;  Laterality: N/A;   MYRINGOTOMY WITH TUBE PLACEMENT     NO PAST SURGERIES     TYMPANOSTOMY TUBE PLACEMENT     UPPER GI ENDOSCOPY     Social History   Social History Narrative   ** Merged History Encounter **       Immunization History  Administered Date(s) Administered   Influenza Split 09/27/2013   Influenza,inj,Quad PF,6+ Mos 11/24/2016, 11/04/2017, 10/17/2019   Influenza-Unspecified 09/26/2014, 11/05/2015   Meningococcal Conjugate 05/03/2007   Pneumococcal Polysaccharide-23 03/22/2013   Tdap 03/22/2013     Objective: Vital Signs: There were no vitals taken for this visit.   Physical Exam   Musculoskeletal Exam:  CDAI Exam: CDAI Score: -- Patient Global: --; Provider Global: -- Swollen: --; Tender: -- Joint Exam 05/14/2021   No joint exam has been documented for this visit   There is currently no information documented on the homunculus. Go to the Rheumatology activity and complete the homunculus joint exam.  Investigation: No additional findings.  Imaging: [place] COMPLETE JOINT SPACE STRUCTURES UP BILAT  Result Date: 04/29/2021 Ultrasound examination of the  bilateral hands was performed per EULAR recommendations. Using 15 MHz transducer, grayscale and power Doppler bilateral second, third, and fifth MCP joints and bilateral wrist joints both dorsal and volar aspects were evaluated to look for synovitis or tenosynovitis. The findings were there was no synovitis or tenosynovitis on ultrasound examination. Right median nerve was 0.08 cm squares which was within normal limits and left median nerve was 0.11 cm squares which was within normal limits Impression: Ultrasound examination of bilateral hands did not show any synovitis or tenosynovitis.  Bilateral median nerves within normal limits.  XR Foot 2 Views Left  Result Date: 04/16/2021 No MCP PIP or DIP narrowing was noted.  No intertarsal, tibiotalar or subtalar joint space narrowing was noted.  A small inferior calcaneal spur was noted. Impression: Unremarkable x-ray of the foot except for a small inferior calcaneal spur.  XR Foot 2 Views Right  Result Date: 04/16/2021 No MTP, PIP and DIP narrowing was noted.  No intertarsal, tibiotalar or subtalar joint space narrowing was noted.  Inferior and posterior calcaneal spurs were noted. Impression: Unremarkable x-ray of the foot except for inferior and posterior calcaneal spurs.  XR Hand 2 View Left  Result Date: 04/16/2021 No MCP, PIP or DIP narrowing was noted.  No intercarpal or radiocarpal joint space narrowing was noted.  No erosive changes were noted. Impression: Unremarkable x-ray of the hand.  XR Hand 2 View Right  Result Date: 04/16/2021 No MCP, PIP or DIP narrowing was noted.  No intercarpal or radiocarpal joint space narrowing was noted.  No erosive changes were noted. Impression: Unremarkable x-ray of the hand.  XR KNEE 3 VIEW LEFT  Result Date: 04/16/2021 No medial or lateral compartment narrowing was noted.  No patellofemoral narrowing was noted.  No chondrocalcinosis was noted. Impression: Unremarkable x-ray of the knee.  XR KNEE 3 VIEW  RIGHT  Result Date: 04/16/2021 No medial or lateral compartment narrowing was noted.  No patellofemoral narrowing was noted.  No chondrocalcinosis was noted. Impression: Unremarkable x-ray of the knee.   Recent Labs: Lab Results  Component Value Date   WBC 11.2 (H) 01/08/2021   HGB 12.7 01/08/2021   PLT 341 01/08/2021   NA 137 01/08/2021   K 3.6 01/08/2021   CL 103 01/08/2021   CO2 25 01/08/2021   GLUCOSE 99 01/08/2021   BUN 9 01/08/2021   CREATININE 0.69 01/08/2021   BILITOT 0.4 10/16/2019   ALKPHOS 99 10/16/2019   AST 19 10/16/2019   ALT 24 10/16/2019   PROT 8.4 (H) 10/16/2019   ALBUMIN 4.4 10/16/2019   CALCIUM 9.1 01/08/2021   GFRAA >60 12/30/2019   04/16/2021 anti-CCP negative, _0 eta negative  02/16/21: uric acid 6.1, ANA negative, RF<10, ESR 12, Mg 1.8, CK 64     Speciality Comments: No specialty comments available.  Procedures:  No procedures performed Allergies: Ciprofloxacin, Sulfa antibiotics, Buspar [buspirone], and Levaquin [levofloxacin]   Assessment / Plan:     Visit Diagnoses: No diagnosis found.  Orders: No orders of the defined types were placed in this encounter.  No orders of the defined types were placed in this encounter.   Face-to-face time spent with patient was *** minutes. Greater than 50% of time was spent in counseling and coordination of care.  Follow-Up Instructions: No follow-ups on file.   Bo Merino, MD  Note - This record has been created using Editor, commissioning.  Chart creation errors have been sought, but may not always  have been located. Such creation errors do not reflect on  the standard of medical care.

## 2021-05-05 DIAGNOSIS — M722 Plantar fascial fibromatosis: Secondary | ICD-10-CM | POA: Diagnosis not present

## 2021-05-05 DIAGNOSIS — M79671 Pain in right foot: Secondary | ICD-10-CM | POA: Diagnosis not present

## 2021-05-05 DIAGNOSIS — M79672 Pain in left foot: Secondary | ICD-10-CM | POA: Diagnosis not present

## 2021-05-13 ENCOUNTER — Other Ambulatory Visit: Payer: Self-pay | Admitting: Nurse Practitioner

## 2021-05-14 ENCOUNTER — Ambulatory Visit: Payer: Medicaid Other | Admitting: Rheumatology

## 2021-05-14 DIAGNOSIS — M79641 Pain in right hand: Secondary | ICD-10-CM

## 2021-05-14 DIAGNOSIS — M5136 Other intervertebral disc degeneration, lumbar region: Secondary | ICD-10-CM

## 2021-05-14 DIAGNOSIS — Z8669 Personal history of other diseases of the nervous system and sense organs: Secondary | ICD-10-CM

## 2021-05-14 DIAGNOSIS — E063 Autoimmune thyroiditis: Secondary | ICD-10-CM

## 2021-05-14 DIAGNOSIS — F988 Other specified behavioral and emotional disorders with onset usually occurring in childhood and adolescence: Secondary | ICD-10-CM

## 2021-05-14 DIAGNOSIS — R160 Hepatomegaly, not elsewhere classified: Secondary | ICD-10-CM

## 2021-05-14 DIAGNOSIS — E559 Vitamin D deficiency, unspecified: Secondary | ICD-10-CM

## 2021-05-14 DIAGNOSIS — G8929 Other chronic pain: Secondary | ICD-10-CM

## 2021-05-14 DIAGNOSIS — E538 Deficiency of other specified B group vitamins: Secondary | ICD-10-CM

## 2021-05-14 DIAGNOSIS — Z8261 Family history of arthritis: Secondary | ICD-10-CM

## 2021-05-14 DIAGNOSIS — K760 Fatty (change of) liver, not elsewhere classified: Secondary | ICD-10-CM

## 2021-05-14 DIAGNOSIS — F411 Generalized anxiety disorder: Secondary | ICD-10-CM

## 2021-05-14 DIAGNOSIS — R5383 Other fatigue: Secondary | ICD-10-CM

## 2021-05-14 DIAGNOSIS — M79671 Pain in right foot: Secondary | ICD-10-CM

## 2021-05-14 DIAGNOSIS — N3010 Interstitial cystitis (chronic) without hematuria: Secondary | ICD-10-CM

## 2021-05-14 DIAGNOSIS — Z8709 Personal history of other diseases of the respiratory system: Secondary | ICD-10-CM

## 2021-05-14 DIAGNOSIS — L732 Hidradenitis suppurativa: Secondary | ICD-10-CM

## 2021-05-14 DIAGNOSIS — F5101 Primary insomnia: Secondary | ICD-10-CM

## 2021-06-02 ENCOUNTER — Other Ambulatory Visit: Payer: Self-pay

## 2021-06-02 ENCOUNTER — Encounter (HOSPITAL_COMMUNITY): Payer: Self-pay | Admitting: Physical Therapy

## 2021-06-02 ENCOUNTER — Ambulatory Visit (HOSPITAL_COMMUNITY): Payer: Medicaid Other | Attending: Internal Medicine | Admitting: Physical Therapy

## 2021-06-02 DIAGNOSIS — M545 Low back pain, unspecified: Secondary | ICD-10-CM | POA: Diagnosis not present

## 2021-06-02 NOTE — Patient Instructions (Signed)
Access Code: [email or web address] URL: [email or web address] Date: 06/02/2021 Prepared by: Georges Lynch  Exercises Supine Transversus Abdominis Bracing - Hands on Stomach - 2-3 x daily - 7 x weekly - 1-2 sets - 10 reps - 3 seconds hold Supine Bridge - 2-3 x daily - 7 x weekly - 1-2 sets - 10 reps - 5 3 seconds hold Static Prone on Elbows - 2-3 x daily - 7 x weekly - 1 sets - 1 reps - 2-3 minutes hold Sidelying Hip Abduction - 2-3 x daily - 7 x weekly - 1-2 sets - 10 reps

## 2021-06-02 NOTE — Therapy (Signed)
[facility] [address] [place], [place], [address] Phone: [phone]   Fax:  [phone]  Physical Therapy Evaluation  Patient Details  Name: Linda [name] MRN: [id number] Date of Birth: 05/08/1991 Referring Provider (PT): Kirstie Peri MD   Encounter Date: 06/02/2021   PT End of Session - 06/02/21 1759     Visit Number 1    Number of Visits 4    Date for PT Re-Evaluation 06/30/21    Authorization Type Medicaid [employer]    Authorization Time Period Check auth    PT Start Time 1725    PT Stop Time 1810    PT Time Calculation (min) 45 min    Activity Tolerance Patient tolerated treatment well    Behavior During Therapy [facility] for tasks assessed/performed             Past Medical History:  Diagnosis Date   Anxiety    Asthma    Heart murmur    Hypothyroidism    IBS (irritable bowel syndrome)    Interstitial cystitis     Past Surgical History:  Procedure Laterality Date   APPENDECTOMY     CESAREAN SECTION N/A 03/20/2013   Procedure: CESAREAN SECTION;  Surgeon: [name], MD;  Location: [facility];  Service: Obstetrics;  Laterality: N/A;   CESAREAN SECTION     LAPAROSCOPIC APPENDECTOMY N/A 10/16/2019   Procedure: APPENDECTOMY LAPAROSCOPIC;  Surgeon: Franky Macho, MD;  Location: [facility];  Service: General;  Laterality: N/A;   MYRINGOTOMY WITH TUBE PLACEMENT     NO PAST SURGERIES     TYMPANOSTOMY TUBE PLACEMENT     UPPER GI ENDOSCOPY      There were no vitals filed for this visit.    Subjective Assessment - 06/02/21 1726     Subjective Patient presents to therapy with complaint of LBP. States she has a bulged disc that has been bothering her since about March. No mechanism of injury. Had CT scan. No pain meds currently, has taken course of steroids. Prolonged positions make pain worse, nothing improved other than some "stretching out"    Limitations Lifting;Standing;House hold activities    Diagnostic tests MRI     Patient Stated Goals Not be in so much pain    Currently in Pain? No/denies   4/10 at worst in past 7 days               [facility] PT Assessment - 06/02/21 0001       Assessment   Medical Diagnosis LBP    Referring Provider (PT) Kirstie Peri MD    Prior Therapy Yes, not for back      Balance Screen   Has the patient fallen in the past 6 months Yes    How many times? 1    Has the patient had a decrease in activity level because of a fear of falling?  No    Is the patient reluctant to leave their home because of a fear of falling?  No      Home [employer] information centre manager residence    Living Arrangements Spouse/significant other;Children      Prior Function   Level of Independence Independent    Vocation Full time employment    Production manager      Cognition   Overall Cognitive Status Within Functional Limits for tasks assessed      ROM / Strength   AROM / PROM / Strength AROM;Strength  AROM   AROM Assessment Site Lumbar    Lumbar Flexion 25% limited   slight increased back pain   Lumbar Extension 50% limited   decreased back pain   Lumbar - Right Side Bend 25% limited    Lumbar - Left Side Bend 25% limited      Strength   Strength Assessment Site Hip;Knee;Ankle    Right/Left Hip Right;Left    Right Hip Flexion 4+/5    Right Hip Extension 4-/5    Right Hip ABduction 4-/5    Left Hip Flexion 5/5    Left Hip Extension 4+/5    Left Hip ABduction 4+/5    Right/Left Knee Right;Left    Right Knee Extension 5/5    Left Knee Extension 5/5    Right/Left Ankle Right;Left    Right Ankle Dorsiflexion 5/5    Left Ankle Dorsiflexion 5/5      Palpation   Palpation comment Mod TTP about RT lumbar paraspinals at L4-5      Special Tests   Other special tests (-) SLR                        Objective measurements completed on examination: See above findings.       [facility] Adult PT Treatment/Exercise - 06/02/21  0001       Exercises   Exercises Lumbar      Lumbar Exercises: Stretches   Prone on Elbows Stretch 1 rep;60 seconds      Lumbar Exercises: Supine   Ab Set 5 reps                    PT Education - 06/02/21 1729     Education Details on evalution findings, POC and HEP    Person(s) Educated Patient    Methods Explanation;Handout    Comprehension Verbalized understanding              PT Short Term Goals - 06/02/21 1809       PT SHORT TERM GOAL #1   Title Patient will be independent with initial HEP and self-management strategies to improve functional outcomes    Time 1    Period Weeks    Status New    Target Date 06/09/21               PT Long Term Goals - 06/02/21 1809       PT LONG TERM GOAL #1   Title Patient will be independent with advanced HEP and self-management strategies to improve functional outcomes    Time 4    Period Weeks    Status New    Target Date 06/30/21      PT LONG TERM GOAL #2   Title Patient will report at least 75% overall improvement in subjective complaint to indicate improvement in ability to perform ADLs.    Time 4    Period Weeks    Status New    Target Date 06/30/21      PT LONG TERM GOAL #3   Title Patient will have pain free lumbar AROM WNL for improved ability to perform ADLs and exercise for improved functional outcomes and symptom resolution.    Time 4    Period Weeks    Status New    Target Date 06/30/21                    Plan - 06/02/21 1803     Clinical Impression  Statement Patient is a 30 y.o. female who presents to physical therapy with complaint of LBP. Patient demonstrates decreased strength, ROM restriction, increased tenderness to palpation and postural abnormalities which are likely contributing to symptoms of pain and are negatively impacting patient ability to perform ADLs and functional mobility tasks. Patient will benefit from skilled physical therapy services to address these  deficits to reduce pain and improve level of function with ADLs and functional mobility tasks.    Examination-Activity Limitations Squat;Stand;Transfers;Lift;Locomotion Level    Examination-Participation Restrictions Occupation;Laundry;Yard Work;Community Activity;Cleaning    Stability/Clinical Decision Making Stable/Uncomplicated    Clinical Decision Making Low    Rehab Potential Good    PT Frequency 1x / week    PT Duration 4 weeks    PT Treatment/Interventions ADLs/Self Care Home Management;Aquatic Therapy;Fluidtherapy;Parrafin;Ultrasound;Neuromuscular re-education;Contrast Bath;Biofeedback;Gait training;DME Instruction;Cryotherapy;Stair training;Orthotic Fit/Training;Patient/family education;Functional mobility training;Electrical Stimulation;Therapeutic activities;Therapeutic exercise;Traction;Moist Heat;Balance training;Manual lymph drainage;Manual techniques;Vasopneumatic Device;Taping;Splinting;Energy conservation;Dry needling;Joint Manipulations;Spinal Manipulations;Other (comment);Passive range of motion;Compression bandaging;Scar mobilization    PT Next Visit Plan Review HEP. Progress hip and core strength with wekkly HEP update, emphasis on extension based activity. Progress to funcitonal core strength, deadbugs, birddogs, palloff press, farmer carries    PT Home Exercise Plan Eval: ab set, bridge, sidelying hip abduction, POE    Consulted and Agree with Plan of Care Patient             Patient will benefit from skilled therapeutic intervention in order to improve the following deficits and impairments:  Decreased activity tolerance, Decreased strength, Pain, Increased fascial restricitons, Postural dysfunction, Decreased range of motion, Improper body mechanics, Impaired perceived functional ability  Visit Diagnosis: Low back pain, unspecified back pain laterality, unspecified chronicity, unspecified whether sciatica present     Problem List Patient Active Problem List    Diagnosis Date Noted   Hidradenitis suppurativa of left axilla 07/10/2020   Hepatomegaly 03/16/2020   Hepatic steatosis 03/16/2020   S/P laparoscopic appendectomy 10/16/2019   Acute appendicitis, uncomplicated    Ovarian cyst, right    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   6:13 PM, 06/02/21 Georges Lynch PT DPT  Physical Therapist with [place]  [facility]  [phone]   [facility] [address] [place], [place], [address] Phone: [phone]   Fax:  [phone]  Name: Linda [name] MRN: [id number] Date of Birth: 1991-07-17

## 2021-06-09 ENCOUNTER — Ambulatory Visit (HOSPITAL_COMMUNITY): Payer: Medicaid Other | Admitting: Physical Therapy

## 2021-06-09 ENCOUNTER — Telehealth (HOSPITAL_COMMUNITY): Payer: Self-pay | Admitting: Physical Therapy

## 2021-06-09 NOTE — Telephone Encounter (Signed)
No show #1. Called and left VM about missed apt and about upcoming apt.   8:03 AM, 06/09/21 Tereasa Coop, DPT Physical Therapy with [facility]  [phone] office

## 2021-06-17 ENCOUNTER — Ambulatory Visit (HOSPITAL_COMMUNITY): Payer: Medicaid Other | Attending: Internal Medicine

## 2021-06-17 ENCOUNTER — Other Ambulatory Visit: Payer: Self-pay

## 2021-06-17 ENCOUNTER — Encounter (HOSPITAL_COMMUNITY): Payer: Self-pay

## 2021-06-17 DIAGNOSIS — M545 Low back pain, unspecified: Secondary | ICD-10-CM | POA: Diagnosis present

## 2021-06-17 NOTE — Patient Instructions (Signed)
Bracing With Arm / Leg Raise (Quadruped)    On hands and knees find neutral spine. Tighten pelvic floor and abdominals and hold. Alternating, lift arm to shoulder level and opposite leg to hip level. Repeat 10 times. Do 2 times a day.   Copyright  VHI. All rights reserved.   Back Hyperextension: Using Arms    Lying face down with arms bent, inhale. Then while exhaling, straighten arms. Hold 10 seconds. Slowly return to starting position. Repeat 10 times per set. Do 2 sessions per day.  Copyright  VHI. All rights reserved.   Paloff with green theraband, sidestep making sure not to twist.

## 2021-06-17 NOTE — Therapy (Signed)
[facility] [address] [place], [place], [address] Phone: [phone]   Fax:  [phone]  Physical Therapy Treatment  Patient Details  Name: Linda [name] MRN: [id number] Date of Birth: [birth year] Referring Provider (PT): Kirstie Peri MD   Encounter Date: 06/17/2021   PT End of Session - 06/17/21 1711     Visit Number 2    Number of Visits 4    Date for PT Re-Evaluation 06/30/21    Authorization Type Medicaid [employer]    Authorization Time Period Check auth    PT Start Time 1705    PT Stop Time 1744    PT Time Calculation (min) 39 min    Activity Tolerance Patient tolerated treatment well    Behavior During Therapy [facility] for tasks assessed/performed             Past Medical History:  Diagnosis Date   Anxiety    Asthma    Heart murmur    Hypothyroidism    IBS (irritable bowel syndrome)    Interstitial cystitis     Past Surgical History:  Procedure Laterality Date   APPENDECTOMY     CESAREAN SECTION N/A 03/20/2013   Procedure: CESAREAN SECTION;  Surgeon: [name], MD;  Location: [facility];  Service: Obstetrics;  Laterality: N/A;   CESAREAN SECTION     LAPAROSCOPIC APPENDECTOMY N/A 10/16/2019   Procedure: APPENDECTOMY LAPAROSCOPIC;  Surgeon: Franky Macho, MD;  Location: [facility];  Service: General;  Laterality: N/A;   MYRINGOTOMY WITH TUBE PLACEMENT     NO PAST SURGERIES     TYMPANOSTOMY TUBE PLACEMENT     UPPER GI ENDOSCOPY      There were no vitals filed for this visit.   Subjective Assessment - 06/17/21 1708     Subjective Pt stated she has constant soreness in lower back and pain into Rt gluteal mm ending upper thigh that feels like a pulled muscle.    Patient Stated Goals Not be in so much pain    Currently in Pain? Yes    Pain Score 3     Pain Location Back    Pain Orientation Lower    Pain Descriptors / Indicators Sore    Pain Type Chronic pain    Pain Onset More than a month ago    Pain  Frequency Constant    Aggravating Factors  stress, certain movements    Pain Relieving Factors massages from husband, stretches    Effect of Pain on Daily Activities limits                [facility] PT Assessment - 06/17/21 0001       Assessment   Medical Diagnosis LBP    Referring Provider (PT) Kirstie Peri MD    Next MD Visit shot in August                           [facility] Adult PT Treatment/Exercise - 06/17/21 0001       Exercises   Exercises Lumbar      Lumbar Exercises: Stretches   Standing Extension 5 reps    Prone on Elbows Stretch Limitations 2 minutes    Press Ups 5 reps;10 seconds      Lumbar Exercises: Standing   Other Standing Lumbar Exercises Paloff walkout 5RT each direction      Lumbar Exercises: Supine   Ab Set 10 reps;5 seconds    AB  Set Limitations paired with breathing    Dead Bug 10 reps;3 seconds    Bridge 10 reps      Lumbar Exercises: Prone   Straight Leg Raise 10 reps;3 seconds    Other Prone Lumbar Exercises heel squeeze      Lumbar Exercises: Quadruped   Opposite Arm/Leg Raise Right arm/Left leg;Left arm/Right leg                    PT Education - 06/17/21 1714     Education Details Reviewed goals, educated importance of HEP compliance daily, pt able to recall and demonstrate appropriate mechanics.    Person(s) Educated Patient    Methods Explanation;Demonstration    Comprehension Verbalized understanding              PT Short Term Goals - 06/02/21 1809       PT SHORT TERM GOAL #1   Title Patient will be independent with initial HEP and self-management strategies to improve functional outcomes    Time 1    Period Weeks    Status New    Target Date 06/09/21               PT Long Term Goals - 06/02/21 1809       PT LONG TERM GOAL #1   Title Patient will be independent with advanced HEP and self-management strategies to improve functional outcomes    Time 4    Period Weeks    Status New     Target Date 06/30/21      PT LONG TERM GOAL #2   Title Patient will report at least 75% overall improvement in subjective complaint to indicate improvement in ability to perform ADLs.    Time 4    Period Weeks    Status New    Target Date 06/30/21      PT LONG TERM GOAL #3   Title Patient will have pain free lumbar AROM WNL for improved ability to perform ADLs and exercise for improved functional outcomes and symptom resolution.    Time 4    Period Weeks    Status New    Target Date 06/30/21                   Plan - 06/17/21 1752     Clinical Impression Statement Reviewed goals, educated importance of HEP compliance for maximal benefits, pt able to recall and demonstrate appropriate exercises.  Session focus with core and proximal strengthening in extension bias exercises.  Educated importance of breathing thorugh all exercises and encouraged to complete abdominal sets with exhalation.  Pt able to complete all exercises with good form following initial cueing, no reports of pain through session.  Added exercises for strengthening and mobiility, printout given and verbalized understanding.    Examination-Activity Limitations Squat;Stand;Transfers;Lift;Locomotion Level    Examination-Participation Restrictions Occupation;Laundry;Yard Work;Community Activity;Cleaning    Stability/Clinical Decision Making Stable/Uncomplicated    Clinical Decision Making Low    Rehab Potential Good    PT Frequency 1x / week    PT Duration 4 weeks    PT Treatment/Interventions ADLs/Self Care Home Management;Aquatic Therapy;Fluidtherapy;Parrafin;Ultrasound;Neuromuscular re-education;Contrast Bath;Biofeedback;Gait training;DME Instruction;Cryotherapy;Stair training;Orthotic Fit/Training;Patient/family education;Functional mobility training;Electrical Stimulation;Therapeutic activities;Therapeutic exercise;Traction;Moist Heat;Balance training;Manual lymph drainage;Manual techniques;Vasopneumatic  Device;Taping;Splinting;Energy conservation;Dry needling;Joint Manipulations;Spinal Manipulations;Other (comment);Passive range of motion;Compression bandaging;Scar mobilization    PT Next Visit Plan Progress hip and core strength with wekkly HEP update, emphasis on extension based activity. Progress to funcitonal core strength, deadbugs, birddogs, palloff press.  Next session add postural strenghtening/ march wiht GTB extension, squats, farmer carries    PT Home Exercise Plan Eval: ab set, bridge, sidelying hip abduction, POE; 8/3: prone press up, quadruped UE/LE, paloff walkout    Consulted and Agree with Plan of Care Patient             Patient will benefit from skilled therapeutic intervention in order to improve the following deficits and impairments:  Decreased activity tolerance, Decreased strength, Pain, Increased fascial restricitons, Postural dysfunction, Decreased range of motion, Improper body mechanics, Impaired perceived functional ability  Visit Diagnosis: Low back pain, unspecified back pain laterality, unspecified chronicity, unspecified whether sciatica present     Problem List Patient Active Problem List   Diagnosis Date Noted   Hidradenitis suppurativa of left axilla 07/10/2020   Hepatomegaly 03/16/2020   Hepatic steatosis 03/16/2020   S/P laparoscopic appendectomy 10/16/2019   Acute appendicitis, uncomplicated    Ovarian cyst, right    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   Becky Sax, LPTA/CLT;  CBIS [phone]  Juel Burrow 06/17/2021, 5:59 PM  [facility] [address] [place], [place], [address] Phone: [phone]   Fax:  [phone]  Name: Linda [name] MRN: [id number] Date of Birth: 1991/03/01

## 2021-06-17 NOTE — Progress Notes (Deleted)
BH MD/PA/NP OP Progress Note  06/17/2021 4:59 PM Linda [name]  MRN:  [id number]  Chief Complaint:  HPI: *** Visit Diagnosis: No diagnosis found.  Past Psychiatric History: Please see initial evaluation for full details. I have reviewed the history. No updates at this time.     Past Medical History:  Past Medical History:  Diagnosis Date   Anxiety    Asthma    Heart murmur    Hypothyroidism    IBS (irritable bowel syndrome)    Interstitial cystitis     Past Surgical History:  Procedure Laterality Date   APPENDECTOMY     CESAREAN SECTION N/A 03/20/2013   Procedure: CESAREAN SECTION;  Surgeon: [name], MD;  Location: [facility];  Service: Obstetrics;  Laterality: N/A;   CESAREAN SECTION     LAPAROSCOPIC APPENDECTOMY N/A 10/16/2019   Procedure: APPENDECTOMY LAPAROSCOPIC;  Surgeon: Franky Macho, MD;  Location: [facility];  Service: General;  Laterality: N/A;   MYRINGOTOMY WITH TUBE PLACEMENT     NO PAST SURGERIES     TYMPANOSTOMY TUBE PLACEMENT     UPPER GI ENDOSCOPY      Family Psychiatric History: Please see initial evaluation for full details. I have reviewed the history. No updates at this time.     Family History:  Family History  Problem Relation Age of Onset   Cancer Paternal Uncle    COPD Maternal Grandfather    Stroke Paternal Grandfather    Depression Mother    OCD Mother    Anxiety disorder Mother    Rheum arthritis Mother    Fibromyalgia Mother    Drug abuse Maternal Uncle    Depression Maternal Uncle    Anxiety disorder Maternal Uncle    Depression Maternal Grandmother    Anxiety disorder Brother    Healthy Brother    Dementia Paternal Grandmother    Autoimmune disease Father    ADD / ADHD Son    Healthy Son     Social History:  Social History   Socioeconomic History   Marital status: Married    Spouse name: Not on file   Number of children: Not on file   Years of education: Not on file   Highest education level: Not on file   Occupational History   Not on file  Tobacco Use   Smoking status: Former    Packs/day: 1.00    Types: Cigarettes    Start date: 03/15/2021   Smokeless tobacco: Never  Vaping Use   Vaping Use: Former  Substance and Sexual Activity   Alcohol use: Yes    Comment: occ   Drug use: No   Sexual activity: Yes    Partners: Male    Birth control/protection: None  Other Topics Concern   Not on file  Social History Narrative   ** Merged History Encounter **       Social Determinants of Health   Financial Resource Strain: Not on file  Food Insecurity: Not on file  Transportation Needs: Not on file  Physical Activity: Not on file  Stress: Not on file  Social Connections: Not on file    Allergies:  Allergies  Allergen Reactions   Ciprofloxacin Nausea Only   Sulfa Antibiotics Diarrhea and Nausea And Vomiting   Buspar [Buspirone] Anxiety   Levaquin [Levofloxacin] Anxiety    Metabolic Disorder Labs: Lab Results  Component Value Date   HGBA1C 5.4 06/05/2020   No results found for: PROLACTIN No results found for: CHOL, TRIG, HDL,  CHOLHDL, VLDL, LDLCALC Lab Results  Component Value Date   TSH 0.957 01/08/2021   TSH 28.86 (H) 07/26/2018    Therapeutic Level Labs: No results found for: LITHIUM No results found for: VALPROATE No components found for:  CBMZ  Current Medications: Current Outpatient Medications  Medication Sig Dispense Refill   albuterol (VENTOLIN HFA) 108 (90 Base) MCG/ACT inhaler Inhale 2 puffs into the lungs every 4 (four) hours as needed for wheezing or shortness of breath. 1 each 1   ALPRAZolam (XANAX) 0.5 MG tablet Take 1 tablet (0.5 mg total) by mouth at bedtime as needed for anxiety. 30 tablet 1   fexofenadine (ALLEGRA) 180 MG tablet Take 180 mg by mouth daily as needed for allergies.      gabapentin (NEURONTIN) 100 MG capsule Take 1 capsule (100 mg total) by mouth 3 (three) times daily. (Patient taking differently: Take 100 mg by mouth as needed.) 90  capsule 1   ibuprofen (ADVIL) 800 MG tablet Take 1 tablet (800 mg total) by mouth 3 (three) times daily. (Patient taking differently: Take 800 mg by mouth as needed.) 21 tablet 0   levothyroxine (SYNTHROID) 200 MCG tablet Take 200 mcg by mouth daily before breakfast.     Multiple Vitamin (MULTIVITAMIN WITH MINERALS) TABS tablet Take 1 tablet by mouth daily.     SELENIUM PO Take by mouth daily.     venlafaxine XR (EFFEXOR-XR) 150 MG 24 hr capsule 225 mg daily. Take along with 75 mg cap 90 capsule 1   venlafaxine XR (EFFEXOR-XR) 75 MG 24 hr capsule 225 mg daily. Take along with 150 mg cap 90 capsule 1   Current Facility-Administered Medications  Medication Dose Route Frequency Provider Last Rate Last Admin   diclofenac (VOLTAREN) EC tablet 75 mg  75 mg Oral BID Elson Areas, [place]         Musculoskeletal: Strength & Muscle Tone:  N/A Gait & Station:  N/A Patient leans: N/A  Psychiatric Specialty Exam: Review of Systems  There were no vitals taken for this visit.There is no height or weight on file to calculate BMI.  General Appearance: {Appearance:22683}  Eye Contact:  {BHH EYE CONTACT:22684}  Speech:  Clear and Coherent  Volume:  Normal  Mood:  {BHH MOOD:22306}  Affect:  {Affect (PAA):22687}  Thought Process:  Coherent  Orientation:  Full (Time, Place, and Person)  Thought Content: Logical   Suicidal Thoughts:  {ST/HT (PAA):22692}  Homicidal Thoughts:  {ST/HT (PAA):22692}  Memory:  Immediate;   Good  Judgement:  {Judgement (PAA):22694}  Insight:  {Insight (PAA):22695}  Psychomotor Activity:  Normal  Concentration:  Concentration: Good and Attention Span: Good  Recall:  Good  Fund of Knowledge: Good  Language: Good  Akathisia:  No  Handed:  Right  AIMS (if indicated): not done  Assets:  Communication Skills Desire for Improvement  ADL's:  Intact  Cognition: WNL  Sleep:  {BHH GOOD/FAIR/POOR:22877}   Screenings: GAD-7    Advertising copywriter from [facility]/05/2021 in  [facility] Office Visit from 03/07/2018 in [facility] Office Visit from 11/22/2017 in [facility]  Total GAD-7 Score 12 14 15       PHQ2-9    Flowsheet Row Video Visit from 04/27/2021 in [facility] Video Visit from 01/26/2021 in [facility] Counselor from 01/05/2021 in [facility] Office Visit from 03/07/2018 in [facility]  PHQ-2 Total Score 0 1 1 5   PHQ-9 Total Score -- -- --  18      Flowsheet Row Video Visit from 04/27/2021 in [facility] ED from 03/02/2021 in [facility]PART[facility] ED from 03/01/2021 in [facility] EM[facility]GENCY DEPARTMENT  C-SSRS RISK CATEGORY No Risk No Risk No Risk        Assessment and Plan:  Linda [name] is a 30 y.o. year old female with a history of depression, anxiety, ADHD, prediabetes, thyroid disease who presents for follow up appointment for below.      1. GAD (generalized anxiety disorder) 2. MDD (major depressive disorder), recurrent, in partial remission (HCC) 3. Panic disorder She reports overall improvement in anxiety since she self restarted lower dose of Xanax in the morning.  Psychosocial stressors includes her son with medical condition of leg pain, financial strain, and her husband changing his job.  She could not tolerate gabapentin due to drowsiness.  Will continue venlafaxine to target depression and anxiety.  Will continue Xanax as needed for anxiety.  Discussed risk of dependence and oversedation especially with concomitant use of opioid.  Will discontinue gabapentin due to adverse reaction.  She is advised again to contact Ms. Bynum for therapy.    Plan 1  Continue venlafaxine 225 mg daily- monitor weight gain  2.  Discontinue gabapentin 3.  Start Xanax 0.5 mg daily as needed for anxiety 4.  Next appointment-August 11 at  140 for 20 minutes, video.  - on hydrocodone, melatonin - sleep study in 2021- increased sleep latency, no OSA according to the patient   Last seen by PCP in 10/2020. fT4 wnl, LDL 119, HbA1c 6.2% on 10/2020    Past trials of medication: sertraline ("Zombie"), citalopram,bupsar (panic attacks),  gabapentin (drowsiness), lithium, Depakote, Abilify (visual impairment), xanax, lorazepam, clonazepam, adderall, Ritalin, Concerta, Trazodone (hypersomnia)   The patient demonstrates the following risk factors for suicide: Chronic risk factors for suicide include: psychiatric disorder of anxiety. Acute risk factors for suicide include: N/A. Protective factors for this patient include: positive social support, responsibility to others (children, family), coping skills and hope for the future. Considering these factors, the overall suicide        Neysa Hotter, MD 06/17/2021, 4:59 PM

## 2021-06-24 ENCOUNTER — Ambulatory Visit (HOSPITAL_COMMUNITY): Payer: Medicaid Other | Admitting: Physical Therapy

## 2021-06-25 ENCOUNTER — Telehealth (INDEPENDENT_AMBULATORY_CARE_PROVIDER_SITE_OTHER): Payer: Medicaid Other | Admitting: Psychiatry

## 2021-06-25 ENCOUNTER — Other Ambulatory Visit: Payer: Self-pay

## 2021-06-25 ENCOUNTER — Telehealth: Payer: Medicaid Other | Admitting: Psychiatry

## 2021-06-25 ENCOUNTER — Encounter: Payer: Self-pay | Admitting: Psychiatry

## 2021-06-25 ENCOUNTER — Telehealth: Payer: Self-pay

## 2021-06-25 DIAGNOSIS — F3281 Premenstrual dysphoric disorder: Secondary | ICD-10-CM | POA: Diagnosis not present

## 2021-06-25 DIAGNOSIS — G4700 Insomnia, unspecified: Secondary | ICD-10-CM

## 2021-06-25 DIAGNOSIS — M5126 Other intervertebral disc displacement, lumbar region: Secondary | ICD-10-CM | POA: Diagnosis not present

## 2021-06-25 DIAGNOSIS — F900 Attention-deficit hyperactivity disorder, predominantly inattentive type: Secondary | ICD-10-CM | POA: Diagnosis not present

## 2021-06-25 DIAGNOSIS — R7303 Prediabetes: Secondary | ICD-10-CM | POA: Diagnosis not present

## 2021-06-25 DIAGNOSIS — E038 Other specified hypothyroidism: Secondary | ICD-10-CM | POA: Diagnosis not present

## 2021-06-25 DIAGNOSIS — F3341 Major depressive disorder, recurrent, in partial remission: Secondary | ICD-10-CM

## 2021-06-25 DIAGNOSIS — E538 Deficiency of other specified B group vitamins: Secondary | ICD-10-CM | POA: Diagnosis not present

## 2021-06-25 DIAGNOSIS — Z87891 Personal history of nicotine dependence: Secondary | ICD-10-CM | POA: Diagnosis not present

## 2021-06-25 DIAGNOSIS — F410 Panic disorder [episodic paroxysmal anxiety] without agoraphobia: Secondary | ICD-10-CM

## 2021-06-25 DIAGNOSIS — F32A Depression, unspecified: Secondary | ICD-10-CM | POA: Diagnosis not present

## 2021-06-25 DIAGNOSIS — E559 Vitamin D deficiency, unspecified: Secondary | ICD-10-CM | POA: Diagnosis not present

## 2021-06-25 DIAGNOSIS — E782 Mixed hyperlipidemia: Secondary | ICD-10-CM | POA: Diagnosis not present

## 2021-06-25 DIAGNOSIS — E8881 Metabolic syndrome: Secondary | ICD-10-CM | POA: Diagnosis not present

## 2021-06-25 DIAGNOSIS — F411 Generalized anxiety disorder: Secondary | ICD-10-CM | POA: Diagnosis not present

## 2021-06-25 DIAGNOSIS — J4520 Mild intermittent asthma, uncomplicated: Secondary | ICD-10-CM | POA: Diagnosis not present

## 2021-06-25 DIAGNOSIS — K760 Fatty (change of) liver, not elsewhere classified: Secondary | ICD-10-CM | POA: Diagnosis not present

## 2021-06-25 MED ORDER — ZOLPIDEM TARTRATE 5 MG PO TABS: 5.0000 mg | ORAL_TABLET | Freq: Every evening | ORAL | 1 refills | Status: DC | PRN

## 2021-06-25 MED ORDER — ZOLPIDEM TARTRATE 10 MG PO TABS: 5.0000 mg | ORAL_TABLET | Freq: Every evening | ORAL | 1 refills | Status: DC | PRN

## 2021-06-25 MED ORDER — ALPRAZOLAM 0.5 MG PO TABS: 0.5000 mg | ORAL_TABLET | Freq: Every evening | ORAL | 1 refills | Status: AC | PRN

## 2021-06-25 NOTE — Addendum Note (Signed)
Addended by: Neysa Hotter on: 06/25/2021 04:26 PM   Modules accepted: Orders

## 2021-06-25 NOTE — Patient Instructions (Addendum)
1  Continue venlafaxine 225 mg daily 2.  Continue Xanax 0.5 mg daily as needed for anxiety 3. Start Ambien 5 mg at night as need for sleep 4.  Next appointment- 10/11 at 1:40

## 2021-06-25 NOTE — Telephone Encounter (Signed)
received fax stating that medicaid only allows 15 tablets a month. please send in zolpiden 10mg  take 1/2 tablet @ HS PRN for sleep.  #15

## 2021-06-25 NOTE — Progress Notes (Signed)
Virtual Visit via Video Note  I connected with Linda [name] on 06/25/21 at  2:40 PM EDT by a video enabled telemedicine application and verified that I am speaking with the correct [name] using two identifiers.  Location: Patient: car Provider: office Persons participated in the visit- patient, provider    I discussed the limitations of evaluation and management by telemedicine and the availability of in [name] appointments. The patient expressed understanding and agreed to proceed.    I discussed the assessment and treatment plan with the patient. The patient was provided an opportunity to ask questions and all were answered. The patient agreed with the plan and demonstrated an understanding of the instructions.   The patient was advised to call back or seek an in-[name] evaluation if the symptoms worsen or if the condition fails to improve as anticipated.  I provided 15 minutes of non-face-to-face time during this encounter.   Neysa Hotter, MD    [facility] MD/PA/NP OP Progress Note  06/25/2021 3:04 PM Linda [name]  MRN:  [id number]  Chief Complaint:  Chief Complaint   Anxiety; Depression; Follow-up    HPI:  This is a follow-up appointment for depression and anxiety.  She states that nothing has changed.  Her son was seen by ortho, and underwent series of tests.  They did not find any significant abnormality to explain his pain and leg.  Her son is scheduled to see a new psychiatrist.  She believes it will be good as long as she is able to see her provider for him not listening to others.  She reports great relationship with her son, stating that they are always close.  She tends to feel up and down, and has panic attacks a week before her menstrual cycle.  She usually feels good after the menstrual cycle starts.  She believes the current medication has been helping her, and is not interested in adjusting her medication.  She is planning to pursue gastric bypass surgery again.   She would like to form to be filled by this clinician.  She is aware of the risk of postop issues, and is still motivated to pursue this.  She has initial insomnia.  She denies feeling depressed or anhedonia.  She denies SI.  She denies alcohol use or drug use.   Employment: works for [employer],  used to work as Social worker Support: Household: husband, son Marital status: married in 8/201.  Divorced once. Number of children: 1 son She grew up in [place]. She reports great relationship with her parents.   Visit Diagnosis:    ICD-10-CM   1. PMDD (premenstrual dysphoric disorder)  F32.81     2. MDD (major depressive disorder), recurrent, in partial remission (HCC)  F33.41     3. GAD (generalized anxiety disorder)  F41.1     4. Panic disorder  F41.0     5. Insomnia, unspecified type  G47.00       Past Psychiatric History: Please see initial evaluation for full details. I have reviewed the history. No updates at this time.     Past Medical History:  Past Medical History:  Diagnosis Date   Anxiety    Asthma    Heart murmur    Hypothyroidism    IBS (irritable bowel syndrome)    Interstitial cystitis     Past Surgical History:  Procedure Laterality Date   APPENDECTOMY     CESAREAN SECTION N/A 03/20/2013   Procedure: CESAREAN SECTION;  Surgeon: Dorris Fetch  Senaida Oresichardson, MD;  Location: [facility];  Service: Obstetrics;  Laterality: N/A;   CESAREAN SECTION     LAPAROSCOPIC APPENDECTOMY N/A 10/16/2019   Procedure: APPENDECTOMY LAPAROSCOPIC;  Surgeon: Franky MachoJenkins, Mark, MD;  Location: [facility];  Service: General;  Laterality: N/A;   MYRINGOTOMY WITH TUBE PLACEMENT     NO PAST SURGERIES     TYMPANOSTOMY TUBE PLACEMENT     UPPER GI ENDOSCOPY      Family Psychiatric History: Please see initial evaluation for full details. I have reviewed the history. No updates at this time.     Family History:  Family History  Problem Relation Age of Onset   Cancer Paternal Uncle    COPD Maternal  Grandfather    Stroke Paternal Grandfather    Depression Mother    OCD Mother    Anxiety disorder Mother    Rheum arthritis Mother    Fibromyalgia Mother    Drug abuse Maternal Uncle    Depression Maternal Uncle    Anxiety disorder Maternal Uncle    Depression Maternal Grandmother    Anxiety disorder Brother    Healthy Brother    Dementia Paternal Grandmother    Autoimmune disease Father    ADD / ADHD Son    Healthy Son     Social History:  Social History   Socioeconomic History   Marital status: Married    Spouse name: Not on file   Number of children: Not on file   Years of education: Not on file   Highest education level: Not on file  Occupational History   Not on file  Tobacco Use   Smoking status: Former    Packs/day: 1.00    Types: Cigarettes    Start date: 03/15/2021   Smokeless tobacco: Never  Vaping Use   Vaping Use: Former  Substance and Sexual Activity   Alcohol use: Yes    Comment: occ   Drug use: No   Sexual activity: Yes    Partners: Male    Birth control/protection: None  Other Topics Concern   Not on file  Social History Narrative   ** Merged History Encounter **       Social Determinants of Health   Financial Resource Strain: Not on file  Food Insecurity: Not on file  Transportation Needs: Not on file  Physical Activity: Not on file  Stress: Not on file  Social Connections: Not on file    Allergies:  Allergies  Allergen Reactions   Ciprofloxacin Nausea Only   Sulfa Antibiotics Diarrhea and Nausea And Vomiting   Buspar [Buspirone] Anxiety   Levaquin [Levofloxacin] Anxiety    Metabolic Disorder Labs: Lab Results  Component Value Date   HGBA1C 5.4 06/05/2020   No results found for: PROLACTIN No results found for: CHOL, TRIG, HDL, CHOLHDL, VLDL, LDLCALC Lab Results  Component Value Date   TSH 0.957 01/08/2021   TSH 28.86 (H) 07/26/2018    Therapeutic Level Labs: No results found for: LITHIUM No results found for:  VALPROATE No components found for:  CBMZ  Current Medications: Current Outpatient Medications  Medication Sig Dispense Refill   zolpidem (AMBIEN) 5 MG tablet Take 1 tablet (5 mg total) by mouth at bedtime as needed for sleep. 30 tablet 1   albuterol (VENTOLIN HFA) 108 (90 Base) MCG/ACT inhaler Inhale 2 puffs into the lungs every 4 (four) hours as needed for wheezing or shortness of breath. 1 each 1   [START ON 07/16/2021] ALPRAZolam (XANAX) 0.5 MG tablet Take 1  tablet (0.5 mg total) by mouth at bedtime as needed for anxiety. 30 tablet 1   fexofenadine (ALLEGRA) 180 MG tablet Take 180 mg by mouth daily as needed for allergies.      ibuprofen (ADVIL) 800 MG tablet Take 1 tablet (800 mg total) by mouth 3 (three) times daily. (Patient taking differently: Take 800 mg by mouth as needed.) 21 tablet 0   levothyroxine (SYNTHROID) 200 MCG tablet Take 200 mcg by mouth daily before breakfast.     Multiple Vitamin (MULTIVITAMIN WITH MINERALS) TABS tablet Take 1 tablet by mouth daily.     SELENIUM PO Take by mouth daily.     venlafaxine XR (EFFEXOR-XR) 150 MG 24 hr capsule 225 mg daily. Take along with 75 mg cap 90 capsule 1   venlafaxine XR (EFFEXOR-XR) 75 MG 24 hr capsule 225 mg daily. Take along with 150 mg cap 90 capsule 1   Current Facility-Administered Medications  Medication Dose Route Frequency Provider Last Rate Last Admin   diclofenac (VOLTAREN) EC tablet 75 mg  75 mg Oral BID Elson Areas, [place]         Musculoskeletal: Strength & Muscle Tone:  N/A Gait & Station:  N/A Patient leans: N/A  Psychiatric Specialty Exam: Review of Systems  Psychiatric/Behavioral:  Positive for dysphoric mood and sleep disturbance. Negative for agitation, behavioral problems, confusion, decreased concentration, hallucinations, self-injury and suicidal ideas. The patient is nervous/anxious. The patient is not hyperactive.   All other systems reviewed and are negative.  There were no vitals taken for this  visit.There is no height or weight on file to calculate BMI.  General Appearance: Fairly Groomed  Eye Contact:  Good  Speech:  Clear and Coherent  Volume:  Normal  Mood:   fine  Affect:  Appropriate, Congruent, and euthymic  Thought Process:  Coherent  Orientation:  Full (Time, Place, and [name])  Thought Content: Logical   Suicidal Thoughts:  No  Homicidal Thoughts:  No  Memory:  Immediate;   Good  Judgement:  Good  Insight:  Good  Psychomotor Activity:  Normal  Concentration:  Concentration: Good and Attention Span: Good  Recall:  Good  Fund of Knowledge: Good  Language: Good  Akathisia:  No  Handed:  Right  AIMS (if indicated): not done  Assets:  Communication Skills Desire for Improvement  ADL's:  Intact  Cognition: WNL  Sleep:  Poor   Screenings: GAD-7    Flowsheet Row Counselor from [facility]/05/2021 [facility] Office Visit from 03/07/2018 in [facility] Office Visit from 11/22/2017 in [facility]  Total GAD-7 Score 12 14 15       PHQ2-9    Flowsheet Row Video Visit from 04/27/2021 in [facility] Video Visit from 01/26/2021 in [facility] Counselor from 01/05/2021 in [facility] Office Visit from 03/07/2018 in [facility]  PHQ-2 Total Score 0 1 1 5   PHQ-9 Total Score -- -- -- 18      Flowsheet Row Video Visit from 04/27/2021 in [facility] ED from 03/02/2021 in [place] EMERGENCY DEPARTMENT ED from 03/01/2021 in [facility] EMERGENCY DEPARTMENT  C-SSRS RISK CATEGORY No Risk No Risk No Risk        Assessment and Plan:  Linda [name] is a 30 y.o. year old female with a history of depression, anxiety, ADHD, prediabetes, thyroid disease, who presents for follow up appointment for below.   1. PMDD (  premenstrual dysphoric disorder) 2. MDD (major depressive  disorder), recurrent, in partial remission (HCC) 3. GAD (generalized anxiety disorder) 4. Panic disorder She denies significant mood symptoms except that she has occasional worsening in anxiety a week before her menstrual cycle.  Psychosocial stressors includes her son with medical condition of leg pain, financial strain, and her husband changing his job.  She is not interested in adjustment of her medication.  She is advised to contact her PCP/OB/GYN to see if COC is beneficial for her.  Will continue venlafaxine to target depression and anxiety.  Will continue Xanax as needed for anxiety.   # Insomnia She continues to report initial insomnia.  Will try Ambien as needed for insomnia.  Discussed potential risk of sleepwalking, drowsiness.     This clinician has discussed the side effect associated with medication prescribed during this encounter. Please refer to notes in the previous encounters for more details.     Plan 1  Continue venlafaxine 225 mg daily- monitor weight gain  2.  Continue Xanax 0.5 mg daily as needed for anxiety 3. Start Ambien 5 mg at night as need for sleep 4.  Next appointment- 10/11 at 1:40 for 20 mins, video -She requested the form to be filled for her to undergo gastric bypass surgery.  The front desk to contact the patient to obtain this form.  - on hydrocodone, melatonin - sleep study in 2021- increased sleep latency, no OSA according to the patient   Last seen by PCP in 10/2020. fT4 wnl, LDL 119, HbA1c 6.2% on 10/2020    Past trials of medication: sertraline ("Zombie"), citalopram,bupsar (panic attacks),  gabapentin (drowsiness), lithium, Depakote, Abilify (visual impairment), xanax, lorazepam, clonazepam, adderall, Ritalin, Concerta, Trazodone (hypersomnia), melatonin (nausea)   The patient demonstrates the following risk factors for suicide: Chronic risk factors for suicide include: psychiatric disorder of anxiety. Acute risk factors for suicide include: N/A.  Protective factors for this patient include: positive social support, responsibility to others (children, family), coping skills and hope for the future. Considering these factors, the overall suicide        Neysa Hotter, MD 06/25/2021, 3:04 PM

## 2021-06-25 NOTE — Telephone Encounter (Signed)
Ordered

## 2021-07-01 ENCOUNTER — Encounter (HOSPITAL_COMMUNITY): Payer: Self-pay | Admitting: Physical Therapy

## 2021-07-01 ENCOUNTER — Other Ambulatory Visit: Payer: Self-pay

## 2021-07-01 ENCOUNTER — Ambulatory Visit (HOSPITAL_COMMUNITY): Payer: Medicaid Other | Admitting: Physical Therapy

## 2021-07-01 DIAGNOSIS — M545 Low back pain, unspecified: Secondary | ICD-10-CM | POA: Diagnosis not present

## 2021-07-01 NOTE — Patient Instructions (Signed)
Access Code: [email or web address] URL: [email or web address] Date: 07/01/2021 Prepared by: Georges Lynch  Exercises Standing Shoulder Row with Anchored Resistance - 1 x daily - 3 x weekly - 2 sets - 10 reps Shoulder Extension with Resistance - 1 x daily - 3 x weekly - 2 sets - 10 reps Squat with Chest Press - 1 x daily - 3 x weekly - 2 sets - 10 reps Standing Anti-Rotation Press with Anchored Resistance - 1 x daily - 3 x weekly - 2 sets - 10 reps

## 2021-07-01 NOTE — Therapy (Signed)
[facility] [address] [place], [place], [address] Phone: [phone]   Fax:  [phone]  Physical Therapy Treatment  Patient Details  Name: Linda [name] MRN: [id number] Date of Birth: [birth year] Referring Provider (PT): Monico Blitz MD  PHYSICAL THERAPY DISCHARGE SUMMARY  Visits from Start of Care: 3  Current functional level related to goals / functional outcomes: See below    Remaining deficits: See below    Education / Equipment: See assessment    Patient agrees to discharge. Patient goals were met. Patient is being discharged due to meeting the stated rehab goals.  Encounter Date: 07/01/2021   PT End of Session - 07/01/21 1545     Visit Number 3    Number of Visits 4    Date for PT Re-Evaluation 07/01/21    Authorization Type Medicaid [employer] [place]    Authorization Time Period 3 visits 7/21-8/16    PT Start Time 1535    PT Stop Time 1615    PT Time Calculation (min) 40 min    Activity Tolerance Patient tolerated treatment well    Behavior During Therapy WFL for tasks assessed/performed             Past Medical History:  Diagnosis Date   Anxiety    Asthma    Heart murmur    Hypothyroidism    IBS (irritable bowel syndrome)    Interstitial cystitis     Past Surgical History:  Procedure Laterality Date   APPENDECTOMY     CESAREAN SECTION N/A 03/20/2013   Procedure: CESAREAN SECTION;  Surgeon: Logan Bores, MD;  Location: [facility];  Service: Obstetrics;  Laterality: N/A;   CESAREAN SECTION     LAPAROSCOPIC APPENDECTOMY N/A 10/16/2019   Procedure: APPENDECTOMY LAPAROSCOPIC;  Surgeon: Aviva Signs, MD;  Location: [facility];  Service: General;  Laterality: N/A;   MYRINGOTOMY WITH TUBE PLACEMENT     NO PAST SURGERIES     TYMPANOSTOMY TUBE PLACEMENT     UPPER GI ENDOSCOPY      There were no vitals filed for this visit.   Subjective Assessment - 07/01/21 1544     Subjective Patient says she is doing  well. Exercises going well. Feels about 70% improved since starting therapy. No pain currently.    Patient Stated Goals Not be in so much pain    Currently in Pain? No/denies    Pain Onset More than a month ago                [facility] PT Assessment - 07/01/21 0001       Assessment   Medical Diagnosis LBP    Referring Provider (PT) Monico Blitz MD    Prior Therapy yes      [place] residence      Prior Function   Level of Independence Independent      Cognition   Overall Cognitive Status Within Functional Limits for tasks assessed      AROM   Lumbar Flexion [facility]    Lumbar Extension [facility]    Lumbar - Right Side [place] [facility]    Lumbar - Left Side Bend [facility]      Strength   Right Hip Flexion 4+/5    Right Hip Extension 4/5    Right Hip ABduction 4/5    Left Hip Flexion 5/5    Left Hip Extension 4+/5    Left Hip ABduction 4+/5    Right Knee  Extension 5/5    Left Knee Extension 5/5                           [facility] Adult PT Treatment/Exercise - 07/01/21 0001       Lumbar Exercises: Stretches   Press Ups 10 reps      Lumbar Exercises: Standing   Functional Squats 20 reps   2 sets with 5lb dumbbell hold   Row 15 reps;Theraband    Theraband Level (Row) Level 4 (Blue)    Shoulder Extension 15 reps;Theraband    Theraband Level (Shoulder Extension) Level 4 (Blue)    Other Standing Lumbar Exercises Paloff press BTB x10 each, palloff walkout BTB x 5 each      Lumbar Exercises: Supine   Dead Bug 10 reps    Bridge 10 reps      Lumbar Exercises: Quadruped   Other Quadruped Lumbar Exercises birddog x10                      PT Short Term Goals - 07/01/21 1639       PT SHORT TERM GOAL #1   Title Patient will be independent with initial HEP and self-management strategies to improve functional outcomes    Baseline Reports compliance, demos good return    Time 1    Period Weeks    Status Achieved    Target Date  06/09/21               PT Long Term Goals - 07/01/21 1639       PT LONG TERM GOAL #1   Title Patient will be independent with advanced HEP and self-management strategies to improve functional outcomes    Baseline Reviewed and answered all questions, issued updated HEP handout    Time 4    Period Weeks    Status Achieved      PT LONG TERM GOAL #2   Title Patient will report at least 75% overall improvement in subjective complaint to indicate improvement in ability to perform ADLs.    Baseline Reports 70%    Time 4    Period Weeks    Status Partially Met      PT LONG TERM GOAL #3   Title Patient will have pain free lumbar AROM WNL for improved ability to perform ADLs and exercise for improved functional outcomes and symptom resolution.    Baseline See AROM    Time 4    Period Weeks    Status Achieved                   Plan - 07/01/21 1640     Clinical Impression Statement Performed reassess today. Patient has made good progress toward therapy goals and is currently with all long term goals met/ partially met. Patient shows good improvement in strength and lumbar mobility. Demos good tolerance and return with comprehensive HEP. Patient being DC today to transition to home program. Encouraged patient to follow up with therapy services with any further questions or concerns.    Examination-Activity Limitations Squat;Stand;Transfers;Lift;Locomotion Level    Examination-Participation Restrictions Occupation;Laundry;Yard Work;Community Activity;Cleaning    Stability/Clinical Decision Making Stable/Uncomplicated    Rehab Potential Good    PT Frequency 1x / week    PT Duration 4 weeks    PT Treatment/Interventions ADLs/Self Care Home Management;Aquatic Therapy;Fluidtherapy;Parrafin;Ultrasound;Neuromuscular re-education;Contrast Bath;Biofeedback;Gait training;DME Instruction;Cryotherapy;Stair training;Orthotic Fit/Training;Patient/family education;Functional mobility  training;Electrical Stimulation;Therapeutic activities;Therapeutic exercise;Traction;Moist Heat;Balance training;Manual lymph drainage;Manual  techniques;Vasopneumatic Device;Taping;Splinting;Energy conservation;Dry needling;Joint Manipulations;Spinal Manipulations;Other (comment);Passive range of motion;Compression bandaging;Scar mobilization    PT Next Visit Plan DC to HEP    PT Home Exercise Plan Eval: ab set, bridge, sidelying hip abduction, POE; 8/3: prone press up, quadruped UE/LE, paloff walkout    Consulted and Agree with Plan of Care Patient             Patient will benefit from skilled therapeutic intervention in order to improve the following deficits and impairments:  Decreased activity tolerance, Decreased strength, Pain, Increased fascial restricitons, Postural dysfunction, Decreased range of motion, Improper body mechanics, Impaired perceived functional ability  Visit Diagnosis: Low back pain, unspecified back pain laterality, unspecified chronicity, unspecified whether sciatica present     Problem List Patient Active Problem List   Diagnosis Date Noted   Hidradenitis suppurativa of left axilla 07/10/2020   Hepatomegaly 03/16/2020   Hepatic steatosis 03/16/2020   S/P laparoscopic appendectomy 10/16/2019   Acute appendicitis, uncomplicated    Ovarian cyst, right    Mood disorder in conditions classified elsewhere 04/06/2018   Generalized anxiety disorder 04/06/2018   Hypothyroidism 06/21/2014   Interstitial cystitis 06/14/2014   ADD (attention deficit disorder) without hyperactivity 06/14/2014   Morbid obesity ([place]) 02/07/2014   Asthma, chronic 04/15/2013   Chronic anxiety 04/15/2013   Cervical strain 03/07/2012   Sprain and strain of unspecified site of shoulder and upper arm 03/07/2012   Pain in joint, shoulder region 03/07/2012   4:48 PM, 07/01/21 Josue Hector PT DPT  Physical Therapist with [facility]  [phone] [place] [address] [place], [place], [address] Phone: [phone]   Fax:  [phone]  Name: Linda [name] MRN: [id number] Date of Birth: May 20, 1991

## 2021-07-13 ENCOUNTER — Encounter: Payer: Self-pay | Admitting: Psychiatry

## 2021-07-16 ENCOUNTER — Telehealth: Payer: Self-pay | Admitting: Psychiatry

## 2021-07-16 DIAGNOSIS — F909 Attention-deficit hyperactivity disorder, unspecified type: Secondary | ICD-10-CM | POA: Diagnosis not present

## 2021-07-16 DIAGNOSIS — F419 Anxiety disorder, unspecified: Secondary | ICD-10-CM | POA: Diagnosis not present

## 2021-07-16 DIAGNOSIS — F39 Unspecified mood [affective] disorder: Secondary | ICD-10-CM | POA: Diagnosis not present

## 2021-07-16 NOTE — Telephone Encounter (Signed)
Received a request regarding the letter to [facility] surgery. The letter was printed out and signed a few days ago. Could you make sure to fax it as soon as we get release form from the patient? Thanks.

## 2021-07-17 NOTE — Telephone Encounter (Signed)
Medication management - Message left for pt to request she comes by the office to sign a consent so prepared letter from Dr.Hisada for [facility] could be printed from 07/13/21 and faxed or she could pick up the letter to take to them.

## 2021-07-21 ENCOUNTER — Telehealth: Payer: Self-pay

## 2021-07-21 NOTE — Telephone Encounter (Signed)
Medication mangement - Telephone call from pt following up on needed letter for [facility]. Agreed to email patient her requested letter from Dr. Vanetta Shawl. Dr. Vanetta Shawl reviewed and signed letter she wrote 07/13/21 and then emailed to patient to the secure email patient provided, [email or web address].  Patient to follow up with getting the letter to [facility] and to call if any other issues or something else needed.

## 2021-07-24 DIAGNOSIS — E063 Autoimmune thyroiditis: Secondary | ICD-10-CM | POA: Diagnosis not present

## 2021-07-24 DIAGNOSIS — E038 Other specified hypothyroidism: Secondary | ICD-10-CM | POA: Diagnosis not present

## 2021-07-24 DIAGNOSIS — Z01818 Encounter for other preprocedural examination: Secondary | ICD-10-CM | POA: Diagnosis not present

## 2021-08-07 DIAGNOSIS — F900 Attention-deficit hyperactivity disorder, predominantly inattentive type: Secondary | ICD-10-CM | POA: Diagnosis not present

## 2021-08-07 DIAGNOSIS — E559 Vitamin D deficiency, unspecified: Secondary | ICD-10-CM | POA: Diagnosis not present

## 2021-08-07 DIAGNOSIS — E538 Deficiency of other specified B group vitamins: Secondary | ICD-10-CM | POA: Diagnosis not present

## 2021-08-07 DIAGNOSIS — E063 Autoimmune thyroiditis: Secondary | ICD-10-CM | POA: Diagnosis not present

## 2021-08-07 DIAGNOSIS — R7303 Prediabetes: Secondary | ICD-10-CM | POA: Diagnosis not present

## 2021-08-07 DIAGNOSIS — E8881 Metabolic syndrome: Secondary | ICD-10-CM | POA: Diagnosis not present

## 2021-08-07 DIAGNOSIS — K760 Fatty (change of) liver, not elsewhere classified: Secondary | ICD-10-CM | POA: Diagnosis not present

## 2021-08-07 DIAGNOSIS — J4520 Mild intermittent asthma, uncomplicated: Secondary | ICD-10-CM | POA: Diagnosis not present

## 2021-08-07 DIAGNOSIS — E038 Other specified hypothyroidism: Secondary | ICD-10-CM | POA: Diagnosis not present

## 2021-08-07 DIAGNOSIS — E782 Mixed hyperlipidemia: Secondary | ICD-10-CM | POA: Diagnosis not present

## 2021-08-07 DIAGNOSIS — Z6841 Body Mass Index (BMI) 40.0 and over, adult: Secondary | ICD-10-CM | POA: Diagnosis not present

## 2021-08-20 DIAGNOSIS — F39 Unspecified mood [affective] disorder: Secondary | ICD-10-CM | POA: Diagnosis not present

## 2021-08-20 DIAGNOSIS — F419 Anxiety disorder, unspecified: Secondary | ICD-10-CM | POA: Diagnosis not present

## 2021-08-21 NOTE — Progress Notes (Deleted)
BH MD/PA/NP OP Progress Note  08/21/2021 2:09 PM Linda [name]  MRN:  [id number]  Chief Complaint:  HPI: *** Visit Diagnosis: No diagnosis found.  Past Psychiatric History: Please see initial evaluation for full details. I have reviewed the history. No updates at this time.     Past Medical History:  Past Medical History:  Diagnosis Date   Anxiety    Asthma    Heart murmur    Hypothyroidism    IBS (irritable bowel syndrome)    Interstitial cystitis     Past Surgical History:  Procedure Laterality Date   APPENDECTOMY     CESAREAN SECTION N/A 03/20/2013   Procedure: CESAREAN SECTION;  Surgeon: [name], MD;  Location: [facility];  Service: Obstetrics;  Laterality: N/A;   CESAREAN SECTION     LAPAROSCOPIC APPENDECTOMY N/A 10/16/2019   Procedure: APPENDECTOMY LAPAROSCOPIC;  Surgeon: Franky Macho, MD;  Location: [facility];  Service: General;  Laterality: N/A;   MYRINGOTOMY WITH TUBE PLACEMENT     NO PAST SURGERIES     TYMPANOSTOMY TUBE PLACEMENT     UPPER GI ENDOSCOPY      Family Psychiatric History: Please see initial evaluation for full details. I have reviewed the history. No updates at this time.     Family History:  Family History  Problem Relation Age of Onset   Cancer Paternal Uncle    COPD Maternal Grandfather    Stroke Paternal Grandfather    Depression Mother    OCD Mother    Anxiety disorder Mother    Rheum arthritis Mother    Fibromyalgia Mother    Drug abuse Maternal Uncle    Depression Maternal Uncle    Anxiety disorder Maternal Uncle    Depression Maternal Grandmother    Anxiety disorder Brother    Healthy Brother    Dementia Paternal Grandmother    Autoimmune disease Father    ADD / ADHD Son    Healthy Son     Social History:  Social History   Socioeconomic History   Marital status: Married    Spouse name: Not on file   Number of children: Not on file   Years of education: Not on file   Highest education level: Not on file   Occupational History   Not on file  Tobacco Use   Smoking status: Former    Packs/day: 1.00    Types: Cigarettes    Start date: 03/15/2021   Smokeless tobacco: Never  Vaping Use   Vaping Use: Former  Substance and Sexual Activity   Alcohol use: Yes    Comment: occ   Drug use: No   Sexual activity: Yes    Partners: Male    Birth control/protection: None  Other Topics Concern   Not on file  Social History Narrative   ** Merged History Encounter **       Social Determinants of Health   Financial Resource Strain: Not on file  Food Insecurity: Not on file  Transportation Needs: Not on file  Physical Activity: Not on file  Stress: Not on file  Social Connections: Not on file    Allergies:  Allergies  Allergen Reactions   Ciprofloxacin Nausea Only   Sulfa Antibiotics Diarrhea and Nausea And Vomiting   Buspar [Buspirone] Anxiety   Levaquin [Levofloxacin] Anxiety    Metabolic Disorder Labs: Lab Results  Component Value Date   HGBA1C 5.4 06/05/2020   No results found for: PROLACTIN No results found for: CHOL, TRIG, HDL,  CHOLHDL, VLDL, LDLCALC Lab Results  Component Value Date   TSH 0.957 01/08/2021   TSH 28.86 (H) 07/26/2018    Therapeutic Level Labs: No results found for: LITHIUM No results found for: VALPROATE No components found for:  CBMZ  Current Medications: Current Outpatient Medications  Medication Sig Dispense Refill   albuterol (VENTOLIN HFA) 108 (90 Base) MCG/ACT inhaler Inhale 2 puffs into the lungs every 4 (four) hours as needed for wheezing or shortness of breath. 1 each 1   ALPRAZolam (XANAX) 0.5 MG tablet Take 1 tablet (0.5 mg total) by mouth at bedtime as needed for anxiety. 30 tablet 1   fexofenadine (ALLEGRA) 180 MG tablet Take 180 mg by mouth daily as needed for allergies.      ibuprofen (ADVIL) 800 MG tablet Take 1 tablet (800 mg total) by mouth 3 (three) times daily. (Patient taking differently: Take 800 mg by mouth as needed.) 21 tablet 0    levothyroxine (SYNTHROID) 200 MCG tablet Take 200 mcg by mouth daily before breakfast.     Multiple Vitamin (MULTIVITAMIN WITH MINERALS) TABS tablet Take 1 tablet by mouth daily.     SELENIUM PO Take by mouth daily.     venlafaxine XR (EFFEXOR-XR) 150 MG 24 hr capsule 225 mg daily. Take along with 75 mg cap 90 capsule 1   venlafaxine XR (EFFEXOR-XR) 75 MG 24 hr capsule 225 mg daily. Take along with 150 mg cap 90 capsule 1   zolpidem (AMBIEN) 10 MG tablet Take 0.5 tablets (5 mg total) by mouth at bedtime as needed for sleep. 15 tablet 1   Current Facility-Administered Medications  Medication Dose Route Frequency Provider Last Rate Last Admin   diclofenac (VOLTAREN) EC tablet 75 mg  75 mg Oral BID Elson Areas, [place]         Musculoskeletal: Strength & Muscle Tone:  N/A Gait & Station:  N/A Patient leans: N/A  Psychiatric Specialty Exam: Review of Systems  There were no vitals taken for this visit.There is no height or weight on file to calculate BMI.  General Appearance: {Appearance:22683}  Eye Contact:  {BHH EYE CONTACT:22684}  Speech:  Clear and Coherent  Volume:  Normal  Mood:  {BHH MOOD:22306}  Affect:  {Affect (PAA):22687}  Thought Process:  Coherent  Orientation:  Full (Time, Place, and Person)  Thought Content: Logical   Suicidal Thoughts:  {ST/HT (PAA):22692}  Homicidal Thoughts:  {ST/HT (PAA):22692}  Memory:  Immediate;   Good  Judgement:  {Judgement (PAA):22694}  Insight:  {Insight (PAA):22695}  Psychomotor Activity:  Normal  Concentration:  Concentration: Good and Attention Span: Good  Recall:  Good  Fund of Knowledge: Good  Language: Good  Akathisia:  No  Handed:  Right  AIMS (if indicated): not done  Assets:  Communication Skills Desire for Improvement  ADL's:  Intact  Cognition: WNL  Sleep:  {BHH GOOD/FAIR/POOR:22877}   Screenings: GAD-7    Advertising copywriter from [facility]05[facility]2021 [facility]  Visit from 03/07/2018 in [facility] Office Visit from 11/22/2017 in [facility]  Total GAD-7 Score 12 14 15       PHQ2-9    Flowsheet Row Video Visit from 04/27/2021 in [facility] Video Visit from 01/26/2021 in [facility] Counselor from 01/05/2021 in [facility] Visit from 03/07/2018 in [facility]  PHQ-2 Total Score 0 1 1 5   PHQ-9 Total Score -- -- -- 18  Flowsheet Row Video Visit from 04/27/2021 in [facility] ED from 03/02/2021 in [facility]Y DEPART[facility] ED from 03/01/2021 in [facility] EMERGENCY DEPARTMENT  C-SSRS RISK CATEGORY No Risk No Risk No Risk        Assessment and Plan:  Linda [name] is a 30 y.o. year old female with a history of depression, anxiety, ADHD, prediabetes, thyroid disease, who presents for follow up appointment for below.    1. PMDD (premenstrual dysphoric disorder) 2. MDD (major depressive disorder), recurrent, in partial remission (HCC) 3. GAD (generalized anxiety disorder) 4. Panic disorder She denies significant mood symptoms except that she has occasional worsening in anxiety a week before her menstrual cycle.  Psychosocial stressors includes her son with medical condition of leg pain, financial strain, and her husband changing his job.  She is not interested in adjustment of her medication.  She is advised to contact her PCP/OB/GYN to see if COC is beneficial for her.  Will continue venlafaxine to target depression and anxiety.  Will continue Xanax as needed for anxiety.    # Insomnia She continues to report initial insomnia.  Will try Ambien as needed for insomnia.  Discussed potential risk of sleepwalking, drowsiness.     This clinician has discussed the side effect associated with medication prescribed during this encounter. Please refer to notes in the previous  encounters for more details.     Plan 1  Continue venlafaxine 225 mg daily- monitor weight gain  2.  Continue Xanax 0.5 mg daily as needed for anxiety 3. Start Ambien 5 mg at night as need for sleep 4.  Next appointment- 10/11 at 1:40 for 20 mins, video -She requested the form to be filled for her to undergo gastric bypass surgery.  The front desk to contact the patient to obtain this form.  - on hydrocodone, melatonin - sleep study in 2021- increased sleep latency, no OSA according to the patient   Last seen by PCP in 10/2020. fT4 wnl, LDL 119, HbA1c 6.2% on 10/2020    Past trials of medication: sertraline ("Zombie"), citalopram,bupsar (panic attacks),  gabapentin (drowsiness), lithium, Depakote, Abilify (visual impairment), xanax, lorazepam, clonazepam, adderall, Ritalin, Concerta, Trazodone (hypersomnia), melatonin (nausea)   The patient demonstrates the following risk factors for suicide: Chronic risk factors for suicide include: psychiatric disorder of anxiety. Acute risk factors for suicide include: N/A. Protective factors for this patient include: positive social support, responsibility to others (children, family), coping skills and hope for the future. Considering these factors, the overall suicide         Neysa Hotter, MD 08/21/2021, 2:09 PM

## 2021-08-25 ENCOUNTER — Telehealth: Payer: Self-pay | Admitting: Psychiatry

## 2021-08-25 ENCOUNTER — Telehealth: Payer: Medicaid Other | Admitting: Psychiatry

## 2021-08-25 ENCOUNTER — Other Ambulatory Visit: Payer: Self-pay

## 2021-08-25 NOTE — Telephone Encounter (Signed)
Sent link for video visit through Epic. Patient did not sign in. Called the patient for appointment scheduled today. The patient did not answer the phone. Left voice message to contact the office ([phone]).   ?

## 2021-08-27 DIAGNOSIS — J4520 Mild intermittent asthma, uncomplicated: Secondary | ICD-10-CM | POA: Diagnosis not present

## 2021-08-27 DIAGNOSIS — E8881 Metabolic syndrome: Secondary | ICD-10-CM | POA: Diagnosis not present

## 2021-08-27 DIAGNOSIS — F32A Depression, unspecified: Secondary | ICD-10-CM | POA: Diagnosis not present

## 2021-08-27 DIAGNOSIS — Z01818 Encounter for other preprocedural examination: Secondary | ICD-10-CM | POA: Diagnosis not present

## 2021-08-27 DIAGNOSIS — F900 Attention-deficit hyperactivity disorder, predominantly inattentive type: Secondary | ICD-10-CM | POA: Diagnosis not present

## 2021-08-27 DIAGNOSIS — E782 Mixed hyperlipidemia: Secondary | ICD-10-CM | POA: Diagnosis not present

## 2021-08-27 DIAGNOSIS — Z6841 Body Mass Index (BMI) 40.0 and over, adult: Secondary | ICD-10-CM | POA: Diagnosis not present

## 2021-08-27 DIAGNOSIS — K760 Fatty (change of) liver, not elsewhere classified: Secondary | ICD-10-CM | POA: Diagnosis not present

## 2021-08-27 DIAGNOSIS — J683 Other acute and subacute respiratory conditions due to chemicals, gases, fumes and vapors: Secondary | ICD-10-CM | POA: Diagnosis not present

## 2021-08-27 DIAGNOSIS — E063 Autoimmune thyroiditis: Secondary | ICD-10-CM | POA: Diagnosis not present

## 2021-08-27 DIAGNOSIS — E559 Vitamin D deficiency, unspecified: Secondary | ICD-10-CM | POA: Diagnosis not present

## 2021-09-13 ENCOUNTER — Other Ambulatory Visit: Payer: Self-pay

## 2021-09-13 ENCOUNTER — Encounter: Payer: Self-pay | Admitting: Emergency Medicine

## 2021-09-13 ENCOUNTER — Ambulatory Visit
Admission: EM | Admit: 2021-09-13 | Discharge: 2021-09-13 | Disposition: A | Discharge status: Home / Self Care | Payer: Medicaid Other | Attending: Urgent Care | Admitting: Urgent Care

## 2021-09-13 DIAGNOSIS — J4530 Mild persistent asthma, uncomplicated: Secondary | ICD-10-CM

## 2021-09-13 DIAGNOSIS — J069 Acute upper respiratory infection, unspecified: Secondary | ICD-10-CM | POA: Diagnosis not present

## 2021-09-13 DIAGNOSIS — Z20822 Contact with and (suspected) exposure to covid-19: Secondary | ICD-10-CM

## 2021-09-13 MED ORDER — PROMETHAZINE-DM 6.25-15 MG/5ML PO SYRP: 5.0000 mL | ORAL_SOLUTION | Freq: Every evening | ORAL | 0 refills | Status: DC | PRN

## 2021-09-13 MED ORDER — BENZONATATE 100 MG PO CAPS: 100.0000 mg | ORAL_CAPSULE | Freq: Three times a day (TID) | ORAL | 0 refills | Status: DC | PRN

## 2021-09-13 MED ORDER — ALBUTEROL SULFATE HFA 108 (90 BASE) MCG/ACT IN AERS: 1.0000 | INHALATION_SPRAY | Freq: Four times a day (QID) | RESPIRATORY_TRACT | 0 refills | Status: DC | PRN

## 2021-09-13 MED ORDER — PSEUDOEPHEDRINE HCL 60 MG PO TABS: 60.0000 mg | ORAL_TABLET | Freq: Three times a day (TID) | ORAL | 0 refills | Status: DC | PRN

## 2021-09-13 NOTE — Discharge Instructions (Addendum)

## 2021-09-13 NOTE — ED Provider Notes (Signed)
[facility]   MRN: [id number] DOB: [birth year]  Subjective:   Linda [name] is a 30 y.o. female presenting for 3-day history of acute onset chest congestion, productive cough, malaise and fatigue, sinus congestion.  Has had multiple sick contacts.  Has a history of asthma and needs a refill on her inhaler.  Patient cannot take oral steroids as she is scheduled to undergo gastric bypass surgery.   Current Facility-Administered Medications:    diclofenac (VOLTAREN) EC tablet 75 mg, 75 mg, Oral, BID, Sofia, Leslie K, [place]  Current Outpatient Medications:    albuterol (VENTOLIN HFA) 108 (90 Base) MCG/ACT inhaler, Inhale 2 puffs into the lungs every 4 (four) hours as needed for wheezing or shortness of breath., Disp: 1 each, Rfl: 1   ALPRAZolam (XANAX) 0.5 MG tablet, Take 1 tablet (0.5 mg total) by mouth at bedtime as needed for anxiety., Disp: 30 tablet, Rfl: 1   fexofenadine (ALLEGRA) 180 MG tablet, Take 180 mg by mouth daily as needed for allergies. , Disp: , Rfl:    ibuprofen (ADVIL) 800 MG tablet, Take 1 tablet (800 mg total) by mouth 3 (three) times daily. (Patient taking differently: Take 800 mg by mouth as needed.), Disp: 21 tablet, Rfl: 0   levothyroxine (SYNTHROID) 200 MCG tablet, Take 200 mcg by mouth daily before breakfast., Disp: , Rfl:    Multiple Vitamin (MULTIVITAMIN WITH MINERALS) TABS tablet, Take 1 tablet by mouth daily., Disp: , Rfl:    SELENIUM PO, Take by mouth daily., Disp: , Rfl:    venlafaxine XR (EFFEXOR-XR) 150 MG 24 hr capsule, 225 mg daily. Take along with 75 mg cap, Disp: 90 capsule, Rfl: 1   venlafaxine XR (EFFEXOR-XR) 75 MG 24 hr capsule, 225 mg daily. Take along with 150 mg cap, Disp: 90 capsule, Rfl: 1   zolpidem (AMBIEN) 10 MG tablet, Take 0.5 tablets (5 mg total) by mouth at bedtime as needed for sleep., Disp: 15 tablet, Rfl: 1   Allergies  Allergen Reactions   Ciprofloxacin Nausea Only   Sulfa Antibiotics Diarrhea and Nausea And  Vomiting   Buspar [Buspirone] Anxiety   Levaquin [Levofloxacin] Anxiety    Past Medical History:  Diagnosis Date   Anxiety    Asthma    Heart murmur    Hypothyroidism    IBS (irritable bowel syndrome)    Interstitial cystitis      Past Surgical History:  Procedure Laterality Date   APPENDECTOMY     CESAREAN SECTION N/A 03/20/2013   Procedure: CESAREAN SECTION;  Surgeon: [name], MD;  Location: [facility];  Service: Obstetrics;  Laterality: N/A;   CESAREAN SECTION     LAPAROSCOPIC APPENDECTOMY N/A 10/16/2019   Procedure: APPENDECTOMY LAPAROSCOPIC;  Surgeon: Franky Macho, MD;  Location: [facility];  Service: General;  Laterality: N/A;   MYRINGOTOMY WITH TUBE PLACEMENT     NO PAST SURGERIES     TYMPANOSTOMY TUBE PLACEMENT     UPPER GI ENDOSCOPY      Family History  Problem Relation Age of Onset   Cancer Paternal Uncle    COPD Maternal Grandfather    Stroke Paternal Grandfather    Depression Mother    OCD Mother    Anxiety disorder Mother    Rheum arthritis Mother    Fibromyalgia Mother    Drug abuse Maternal Uncle    Depression Maternal Uncle    Anxiety disorder Maternal Uncle    Depression Maternal Grandmother    Anxiety disorder Brother  Healthy Brother    Dementia Paternal Grandmother    Autoimmune disease Father    ADD / ADHD Son    Healthy Son     Social History   Tobacco Use   Smoking status: Former    Packs/day: 1.00    Types: Cigarettes    Start date: 03/15/2021   Smokeless tobacco: Never  Vaping Use   Vaping Use: Former  Substance Use Topics   Alcohol use: Yes    Comment: occ   Drug use: No    ROS   Objective:   Vitals: BP 128/80 (BP Location: Right Arm)   Pulse 88   Temp 98.2 F (36.8 C) (Oral)   Resp 18   LMP 08/31/2021 (Exact Date)   SpO2 97%   Physical Exam Constitutional:      General: She is not in acute distress.    Appearance: Normal appearance. She is well-developed. She is obese. She is not ill-appearing,  toxic-appearing or diaphoretic.  HENT:     Head: Normocephalic and atraumatic.     Right Ear: Tympanic membrane, ear canal and external ear normal. No drainage or tenderness. No middle ear effusion. Tympanic membrane is not erythematous.     Left Ear: Tympanic membrane, ear canal and external ear normal. No drainage or tenderness.  No middle ear effusion. Tympanic membrane is not erythematous.     Nose: Congestion present. No rhinorrhea.     Mouth/Throat:     Mouth: Mucous membranes are moist. No oral lesions.     Pharynx: No pharyngeal swelling, oropharyngeal exudate, posterior oropharyngeal erythema or uvula swelling.     Tonsils: No tonsillar exudate or tonsillar abscesses.  Eyes:     General: No scleral icterus.       Right eye: No discharge.        Left eye: No discharge.     Extraocular Movements: Extraocular movements intact.     Right eye: Normal extraocular motion.     Left eye: Normal extraocular motion.     Conjunctiva/sclera: Conjunctivae normal.     Pupils: Pupils are equal, round, and reactive to light.  Cardiovascular:     Rate and Rhythm: Normal rate and regular rhythm.     Pulses: Normal pulses.     Heart sounds: Normal heart sounds. No murmur heard.   No friction rub. No gallop.  Pulmonary:     Effort: Pulmonary effort is normal. No respiratory distress.     Breath sounds: Normal breath sounds. No stridor. No wheezing, rhonchi or rales.  Musculoskeletal:     Cervical back: Normal range of motion and neck supple.  Lymphadenopathy:     Cervical: No cervical adenopathy.  Skin:    General: Skin is warm and dry.     Findings: No rash.  Neurological:     General: No focal deficit present.     Mental Status: She is alert and oriented to person, place, and time.  Psychiatric:        Mood and Affect: Mood normal.        Behavior: Behavior normal.        Thought Content: Thought content normal.        Judgment: Judgment normal.    Assessment and Plan :   PDMP not  reviewed this encounter.  1. Viral upper respiratory tract infection with cough   2. Exposure to COVID-19 virus   3. Mild persistent asthma without complication    Respiratory panel pending. Will manage for viral illness  such as viral URI, viral syndrome, viral rhinitis, COVID-19, influenza. Recommended supportive care. Offered scripts for symptomatic relief. Testing is pending. Deferred imaging given clear cardiopulmonary exam, hemodynamically stable vital signs.  Deferred prednisone at her request.  Refilled her albuterol inhaler.  Counseled patient on potential for adverse effects with medications prescribed/recommended today, ER and return-to-clinic precautions discussed, patient verbalized understanding.     Wallis Bamberg, [place] 09/13/21 [id number]

## 2021-09-13 NOTE — ED Triage Notes (Signed)
Chest congestion, productive cough with green sputum, feels tired.  Some nasal congestion x 3 days.

## 2021-09-14 LAB — COVID-19, FLU A+B NAA
Influenza A, NAA: NOT DETECTED
Influenza B, NAA: NOT DETECTED
SARS-CoV-2, NAA: NOT DETECTED

## 2021-10-01 DIAGNOSIS — E559 Vitamin D deficiency, unspecified: Secondary | ICD-10-CM | POA: Diagnosis not present

## 2021-10-01 DIAGNOSIS — E063 Autoimmune thyroiditis: Secondary | ICD-10-CM | POA: Diagnosis not present

## 2021-10-01 DIAGNOSIS — Z6841 Body Mass Index (BMI) 40.0 and over, adult: Secondary | ICD-10-CM | POA: Diagnosis not present

## 2021-10-01 DIAGNOSIS — E038 Other specified hypothyroidism: Secondary | ICD-10-CM | POA: Diagnosis not present

## 2021-10-01 DIAGNOSIS — K760 Fatty (change of) liver, not elsewhere classified: Secondary | ICD-10-CM | POA: Diagnosis not present

## 2021-10-01 DIAGNOSIS — F900 Attention-deficit hyperactivity disorder, predominantly inattentive type: Secondary | ICD-10-CM | POA: Diagnosis not present

## 2021-10-01 DIAGNOSIS — J4520 Mild intermittent asthma, uncomplicated: Secondary | ICD-10-CM | POA: Diagnosis not present

## 2021-10-01 DIAGNOSIS — Z01818 Encounter for other preprocedural examination: Secondary | ICD-10-CM | POA: Diagnosis not present

## 2021-10-01 DIAGNOSIS — R7303 Prediabetes: Secondary | ICD-10-CM | POA: Diagnosis not present

## 2021-10-01 DIAGNOSIS — E538 Deficiency of other specified B group vitamins: Secondary | ICD-10-CM | POA: Diagnosis not present

## 2021-10-01 DIAGNOSIS — E8881 Metabolic syndrome: Secondary | ICD-10-CM | POA: Diagnosis not present

## 2021-10-02 DIAGNOSIS — J4541 Moderate persistent asthma with (acute) exacerbation: Secondary | ICD-10-CM | POA: Diagnosis not present

## 2021-10-02 DIAGNOSIS — B3731 Acute candidiasis of vulva and vagina: Secondary | ICD-10-CM | POA: Diagnosis not present

## 2021-10-12 DIAGNOSIS — K219 Gastro-esophageal reflux disease without esophagitis: Secondary | ICD-10-CM | POA: Diagnosis not present

## 2021-10-12 DIAGNOSIS — J4520 Mild intermittent asthma, uncomplicated: Secondary | ICD-10-CM | POA: Diagnosis not present

## 2021-10-12 DIAGNOSIS — F419 Anxiety disorder, unspecified: Secondary | ICD-10-CM | POA: Diagnosis not present

## 2021-10-12 DIAGNOSIS — E8881 Metabolic syndrome: Secondary | ICD-10-CM | POA: Diagnosis not present

## 2021-10-12 DIAGNOSIS — R011 Cardiac murmur, unspecified: Secondary | ICD-10-CM | POA: Diagnosis not present

## 2021-10-12 DIAGNOSIS — E039 Hypothyroidism, unspecified: Secondary | ICD-10-CM | POA: Diagnosis not present

## 2021-10-12 DIAGNOSIS — G4733 Obstructive sleep apnea (adult) (pediatric): Secondary | ICD-10-CM | POA: Diagnosis not present

## 2021-10-12 DIAGNOSIS — K760 Fatty (change of) liver, not elsewhere classified: Secondary | ICD-10-CM | POA: Diagnosis not present

## 2021-10-12 DIAGNOSIS — E782 Mixed hyperlipidemia: Secondary | ICD-10-CM | POA: Diagnosis not present

## 2021-10-12 DIAGNOSIS — Z6841 Body Mass Index (BMI) 40.0 and over, adult: Secondary | ICD-10-CM | POA: Diagnosis not present

## 2021-10-15 DIAGNOSIS — Z9884 Bariatric surgery status: Secondary | ICD-10-CM | POA: Diagnosis not present

## 2021-10-15 DIAGNOSIS — R197 Diarrhea, unspecified: Secondary | ICD-10-CM | POA: Diagnosis not present

## 2021-10-20 DIAGNOSIS — E039 Hypothyroidism, unspecified: Secondary | ICD-10-CM | POA: Diagnosis not present

## 2021-10-20 DIAGNOSIS — Z9884 Bariatric surgery status: Secondary | ICD-10-CM | POA: Diagnosis not present

## 2021-10-20 DIAGNOSIS — R300 Dysuria: Secondary | ICD-10-CM | POA: Diagnosis not present

## 2021-10-28 DIAGNOSIS — Z6841 Body Mass Index (BMI) 40.0 and over, adult: Secondary | ICD-10-CM | POA: Diagnosis not present

## 2021-10-28 DIAGNOSIS — E8881 Metabolic syndrome: Secondary | ICD-10-CM | POA: Diagnosis not present

## 2021-10-28 DIAGNOSIS — Z713 Dietary counseling and surveillance: Secondary | ICD-10-CM | POA: Diagnosis not present

## 2021-12-15 DIAGNOSIS — Z79899 Other long term (current) drug therapy: Secondary | ICD-10-CM | POA: Diagnosis not present

## 2021-12-15 DIAGNOSIS — F411 Generalized anxiety disorder: Secondary | ICD-10-CM | POA: Diagnosis not present

## 2022-01-01 DIAGNOSIS — Z6841 Body Mass Index (BMI) 40.0 and over, adult: Secondary | ICD-10-CM | POA: Diagnosis not present

## 2022-01-01 DIAGNOSIS — E8881 Metabolic syndrome: Secondary | ICD-10-CM | POA: Diagnosis not present

## 2022-01-01 DIAGNOSIS — Z713 Dietary counseling and surveillance: Secondary | ICD-10-CM | POA: Diagnosis not present

## 2022-02-08 DIAGNOSIS — B372 Candidiasis of skin and nail: Secondary | ICD-10-CM | POA: Diagnosis not present

## 2022-02-08 DIAGNOSIS — B9689 Other specified bacterial agents as the cause of diseases classified elsewhere: Secondary | ICD-10-CM | POA: Diagnosis not present

## 2022-02-08 DIAGNOSIS — R300 Dysuria: Secondary | ICD-10-CM | POA: Diagnosis not present

## 2022-02-08 DIAGNOSIS — F419 Anxiety disorder, unspecified: Secondary | ICD-10-CM | POA: Diagnosis not present

## 2022-02-08 DIAGNOSIS — N760 Acute vaginitis: Secondary | ICD-10-CM | POA: Diagnosis not present

## 2022-04-21 DIAGNOSIS — M5416 Radiculopathy, lumbar region: Secondary | ICD-10-CM | POA: Diagnosis not present

## 2022-05-16 ENCOUNTER — Encounter (HOSPITAL_COMMUNITY): Payer: Self-pay | Admitting: *Deleted

## 2022-05-16 ENCOUNTER — Other Ambulatory Visit: Payer: Self-pay

## 2022-05-16 ENCOUNTER — Ambulatory Visit (HOSPITAL_COMMUNITY)
Admission: RE | Admit: 2022-05-16 | Discharge: 2022-05-16 | Disposition: A | Discharge status: Home / Self Care | Payer: Medicaid Other | Attending: Psychiatry | Admitting: Psychiatry

## 2022-05-16 ENCOUNTER — Emergency Department (HOSPITAL_COMMUNITY)
Admission: EM | Admit: 2022-05-16 | Discharge: 2022-05-16 | Disposition: A | Discharge status: Home / Self Care | Payer: Medicaid Other | Attending: Emergency Medicine | Admitting: Emergency Medicine

## 2022-05-16 DIAGNOSIS — F419 Anxiety disorder, unspecified: Secondary | ICD-10-CM | POA: Diagnosis not present

## 2022-05-16 DIAGNOSIS — F32A Depression, unspecified: Secondary | ICD-10-CM

## 2022-05-16 DIAGNOSIS — Z20822 Contact with and (suspected) exposure to covid-19: Secondary | ICD-10-CM | POA: Diagnosis not present

## 2022-05-16 HISTORY — DX: Depression, unspecified: F32.A

## 2022-05-16 LAB — COMPREHENSIVE METABOLIC PANEL
ALT: 24 U/L (ref 0–44)
AST: 20 U/L (ref 15–41)
Albumin: 4 g/dL (ref 3.5–5.0)
Alkaline Phosphatase: 145 U/L — ABNORMAL HIGH (ref 38–126)
Anion gap: 7 (ref 5–15)
BUN: 8 mg/dL (ref 6–20)
CO2: 27 mmol/L (ref 22–32)
Calcium: 9.3 mg/dL (ref 8.9–10.3)
Chloride: 107 mmol/L (ref 98–111)
Creatinine, Ser: 0.77 mg/dL (ref 0.44–1.00)
GFR, Estimated: >60 mL/min (ref >60)
Glucose, Bld: 99 mg/dL (ref 70–99)
Potassium: 3.4 mmol/L — ABNORMAL LOW (ref 3.5–5.1)
Sodium: 141 mmol/L (ref 135–145)
Total Bilirubin: 1.1 mg/dL (ref 0.3–1.2)
Total Protein: 7.5 g/dL (ref 6.5–8.1)

## 2022-05-16 LAB — CBC WITH DIFFERENTIAL/PLATELET
Abs Immature Granulocytes: 0.03 10*3/uL (ref 0.00–0.07)
Basophils Absolute: 0.1 10*3/uL (ref 0.0–0.1)
Basophils Relative: 1 %
Eosinophils Absolute: 0.3 10*3/uL (ref 0.0–0.5)
Eosinophils Relative: 3 %
HCT: 39.2 % (ref 36.0–46.0)
Hemoglobin: 12.4 g/dL (ref 12.0–15.0)
Immature Granulocytes: 0 %
Lymphocytes Relative: 22 %
Lymphs Abs: 2.4 10*3/uL (ref 0.7–4.0)
MCH: 27.9 pg (ref 26.0–34.0)
MCHC: 31.6 g/dL (ref 30.0–36.0)
MCV: 88.3 fL (ref 80.0–100.0)
Monocytes Absolute: 0.7 10*3/uL (ref 0.1–1.0)
Monocytes Relative: 6 %
Neutro Abs: 7.5 10*3/uL (ref 1.7–7.7)
Neutrophils Relative %: 68 %
Platelets: 318 10*3/uL (ref 150–400)
RBC: 4.44 MIL/uL (ref 3.87–5.11)
RDW: 13.4 % (ref 11.5–15.5)
WBC: 11 10*3/uL — ABNORMAL HIGH (ref 4.0–10.5)
nRBC: 0 % (ref 0.0–0.2)

## 2022-05-16 LAB — RAPID URINE DRUG SCREEN, HOSP PERFORMED
Amphetamines: NOT DETECTED
Barbiturates: NOT DETECTED
Benzodiazepines: POSITIVE — AB
Cocaine: NOT DETECTED
Opiates: NOT DETECTED
Tetrahydrocannabinol: NOT DETECTED

## 2022-05-16 LAB — RESP PANEL BY RT-PCR (FLU A&B, COVID) ARPGX2
Influenza A by PCR: NEGATIVE
Influenza B by PCR: NEGATIVE
SARS Coronavirus 2 by RT PCR: NEGATIVE

## 2022-05-16 LAB — ETHANOL: Alcohol, Ethyl (B): <10 mg/dL (ref <10)

## 2022-05-16 LAB — HCG, QUANTITATIVE, PREGNANCY: hCG, Beta Chain, Quant, S: <1 m[IU]/mL (ref <5)

## 2022-05-16 MED ORDER — NICOTINE 21 MG/24HR TD PT24
21.0000 mg | MEDICATED_PATCH | Freq: Every day | TRANSDERMAL | Status: DC
  Filled 2022-05-16: qty 1

## 2022-05-16 MED ORDER — ALUM & MAG HYDROXIDE-SIMETH 200-200-20 MG/5ML PO SUSP: 30.0000 mL | Freq: Four times a day (QID) | ORAL | Status: DC | PRN

## 2022-05-16 MED ORDER — ACETAMINOPHEN 325 MG PO TABS: 650.0000 mg | ORAL_TABLET | ORAL | Status: DC | PRN

## 2022-05-16 MED ORDER — ONDANSETRON HCL 4 MG PO TABS
4.0000 mg | ORAL_TABLET | Freq: Three times a day (TID) | ORAL | Status: DC | PRN
Start: 2022-05-16 — End: 2022-05-16

## 2022-05-16 NOTE — H&P (Signed)
Behavioral Health Medical Screening Exam  Linda [name] is an 31 y.o. female with history of anxiety and depression who presented to [facility] voluntarily for assessment with her husband. Of note pt psych cleared from [facility] within past hour by this provider for similar presentation. Patient reports increased anxiety, diarrhea, and feeling uncomfortable since recent medication adjustments Levothyroxine, Escitalopram (Lexapro) 15 mg from prescriber. Chart reviewed, Escitalopram 10 mg filled 04/19/22; patient states provider (Dr [name]- [facility]) increased to 15 mg over the phone instead of in person this past week.   She denies any SI/HI/AVH. States she is unsure what she needs. Provider discussed returning to original dose and following up with precriber in the morning about possible adjustment as pt does not meet inpatient criteria and declined [facility] observation at this time. PDMP reviewed, active prescriptions for Diazepam 5 mg 45 tabs filled 05/13/22 pt states she takes BID, Alprazolam 1 mg Tab filled 05/12/22 pt states she takes at night for sleep. Provided verbal permission to speak to husband in lobby for collateral information.   Collateral: Nadyne Coombes (husband) in lobby Reports x1 week increased anxiety, depression. Unable to manage at home. Sleeps 6-8 hrs/night, with help of Xanax. Usually takes Lexapro at night, Synthroid in a.m. 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 Denies any concerns for safety, feels pt is safe to return home.     Total Time spent with patient: 30 minutes  Psychiatric Specialty Exam: Physical Exam Vitals and nursing note reviewed.  Constitutional:      General: She is not in acute distress.    Appearance: She is not ill-appearing.  HENT:     Head: Normocephalic.     Nose: Nose normal.     Mouth/Throat:     Mouth: Mucous membranes are moist.     Pharynx: Oropharynx is clear.  Eyes:     Pupils: Pupils are equal, round, and reactive to light.   Cardiovascular:     Rate and Rhythm: Normal rate.     Pulses: Normal pulses.  Pulmonary:     Effort: Pulmonary effort is normal.  Abdominal:     Palpations: Abdomen is soft.  Musculoskeletal:        General: Normal range of motion.     Cervical back: Normal range of motion.  Skin:    General: Skin is warm and dry.  Neurological:     Mental Status: She is alert and oriented to person, place, and time.  Psychiatric:        Attention and Perception: Attention and perception normal.        Mood and Affect: Mood is depressed.        Speech: Speech normal.        Behavior: Behavior is cooperative.        Thought Content: Thought content normal. Thought content is not paranoid or delusional. Thought content does not include homicidal or suicidal ideation. Thought content does not include homicidal or suicidal plan.        Cognition and Memory: Cognition and memory normal.        Judgment: Judgment normal.    Review of Systems  Constitutional:  Positive for appetite change.  Psychiatric/Behavioral:  Positive for dysphoric mood. The patient is nervous/anxious.   All other systems reviewed and are negative.  There were no vitals taken for this visit.There is no height or weight on file to calculate BMI. General Appearance: Casual Eye Contact:  Fair Speech:  Clear and Coherent Volume:  Normal Mood:  Dysphoric Affect:  Blunt Thought Process:  Coherent Orientation:  Full (Time, Place, and Person) Thought Content:  Logical Suicidal Thoughts:  No Homicidal Thoughts:  No Memory:  Immediate;   Good Recent;   Good Remote;   Good Judgement:  Intact Insight:  Present Psychomotor Activity:  Normal Concentration: Concentration: Fair and Attention Span: Fair Recall:  [employer]:Fair Language: Fair Akathisia:  NA Handed:  Right AIMS (if indicated):    Assets:  Architect Housing Intimacy Physical Health Resilience Social  Support Vocational/Educational Sleep:     Musculoskeletal: Strength & Muscle Tone: within normal limits Gait & Station: normal Patient leans: N/A  There were no vitals taken for this visit.  Recommendations: Based on my evaluation the patient does not appear to have an emergency medical condition. Patient and husband provided education on Lexapro and side effects. Instructed patient to return to original dose until able to follow up with prescriber given history of gastric bypass and increased symptoms since adjustment then to take in morning vs at night. Patient denies any suicidal or homicidal ideations and both (pt and husband) states she feels safe returning home.   Loletta Parish, NP 05/16/2022, 7:46 PM
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
## 2022-05-16 NOTE — ED Triage Notes (Signed)
Pt has history of anxiety and depression, symptoms are becoming worse, pt is on medications and took them last night. Denies Si/HI

## 2022-05-16 NOTE — ED Notes (Signed)
Pt reports anxiety and depression that has been ongoing. Pt reports they adjusted her meds on Thursday. Pt denies SI/HI. Educated patient about how antidepressants work. Educated pt that medications can take time to take effect in the system.

## 2022-05-16 NOTE — ED Provider Notes (Cosign Needed Addendum)
[facility]-EMERGENCY DEPT Provider Note   CSN: [id number] Arrival date & time: 05/16/22  1149     History  Chief Complaint  Patient presents with   Anxiety   Depression    Linda [name] is a 31 y.o. female with history of anxiety, depression, and Hashimoto's thyroiditis that presents with 3 days of worsening anxiety and depression. She states that her symptoms have not allowed her to work at her job as a Scientist, physiological for a mental health facility. She states that she has been experiencing panic attacks more frequently where she becomes warm and SOB. The patient called the psychiatric hotline for her regular provider today and was instructed to visit the ED. The patient follows up with Dr. [name] (psych) for her anxiety and depression. She last saw Dr. [name] 3 days ago where her dosage of Lexapro was increased from 10mg  to 15mg . The patient states that she has been prescribed Celexa and Effexor in the past for anxiety and depression which did not help her symptoms. She states that she is not currently receiving counseling. Patient states that she is prescribed xanax to help her sleep and that she is not currently having trouble sleeping. The patient also saw her primary care doctor this week and had her dosage of Levothyroxine increased due to an elevated TSH. The patient also endorses diarrhea for the last 3 days.   The patient states that she does not drink ETOH or use any recreational drugs. The patient does not have SOB currently. The patient denies suicidal ideation, homicidal ideation, chest pain, cough, fever, chills, abdominal pain, nausea, vomiting.   The history is provided by the patient.  Anxiety Pertinent negatives include no chest pain, no abdominal pain and no shortness of breath.  Depression Pertinent negatives include no chest pain, no abdominal pain and no shortness of breath.       Home Medications Prior to Admission medications   Medication  Sig Start Date End Date Taking? Authorizing Provider  albuterol (VENTOLIN HFA) 108 (90 Base) MCG/ACT inhaler Inhale 1-2 puffs into the lungs every 6 (six) hours as needed for wheezing or shortness of breath. 09/13/21   , PA-C  benzonatate (TESSALON) 100 MG capsule Take 1-2 capsules (100-200 mg total) by mouth 3 (three) times daily as needed for cough. 09/13/21   Wallis Bamberg, PA-C  fexofenadine (ALLEGRA) 180 MG tablet Take 180 mg by mouth daily as needed for allergies.     [provider]  ibuprofen (ADVIL) 800 MG tablet Take 1 tablet (800 mg total) by mouth 3 (three) times daily. Patient taking differently: Take 800 mg by mouth as needed. 03/01/21   Wallis Bamberg, PA-C  levothyroxine (SYNTHROID) 200 MCG tablet Take 200 mcg by mouth daily before breakfast.    [provider]  Multiple Vitamin (MULTIVITAMIN WITH MINERALS) TABS tablet Take 1 tablet by mouth daily.    [provider]  promethazine-dextromethorphan (PROMETHAZINE-DM) 6.25-15 MG/5ML syrup Take 5 mLs by mouth at bedtime as needed for cough. 09/13/21   01-17-1990, PA-C  pseudoephedrine (SUDAFED) 60 MG tablet Take 1 tablet (60 mg total) by mouth every 8 (eight) hours as needed for congestion. 09/13/21   Wallis Bamberg, PA-C  SELENIUM PO Take by mouth daily.    [provider]  venlafaxine XR (EFFEXOR-XR) 150 MG 24 hr capsule 225 mg daily. Take along with 75 mg cap 04/27/21   Wallis Bamberg, MD  venlafaxine XR (EFFEXOR-XR) 75 MG 24 hr capsule 225  mg daily. Take along with 150 mg cap 04/27/21   Neysa Hotter, MD  zolpidem (AMBIEN) 10 MG tablet Take 0.5 tablets (5 mg total) by mouth at bedtime as needed for sleep. 06/25/21 08/24/21  Neysa Hotter, MD      Allergies    Ciprofloxacin, Sulfa antibiotics, Buspar [buspirone], and Levaquin [levofloxacin]    Review of Systems   Review of Systems  Constitutional:  Negative for chills and fever.  Respiratory:  Negative for cough and shortness of breath.    Cardiovascular:  Negative for chest pain.  Gastrointestinal:  Positive for diarrhea. Negative for abdominal pain, nausea and vomiting.  Psychiatric/Behavioral:  Positive for depression and dysphoric mood. Negative for hallucinations and suicidal ideas. The patient is nervous/anxious.        Negative for homicidal ideation.     Physical Exam Updated Vital Signs BP (!) 144/92   Pulse 76   Temp 97.7 F (36.5 C) (Oral)   Resp 20   Ht 5\' 5"  (1.651 m)   Wt 95.3 kg   SpO2 97%   BMI 34.95 kg/m  Physical Exam  ED Results / Procedures / Treatments   Labs (all labs ordered are listed, but only abnormal results are displayed) Labs Reviewed  RESP PANEL BY RT-PCR (FLU A&B, COVID) ARPGX2  COMPREHENSIVE METABOLIC PANEL  ETHANOL  RAPID URINE DRUG SCREEN, HOSP PERFORMED  CBC WITH DIFFERENTIAL/PLATELET  I-STAT BETA HCG BLOOD, ED ([facility], [facility], [facility] ONLY)    EKG None  Radiology No results found.  Procedures Procedures    Medications Ordered in ED Medications - No data to display  ED Course/ Medical Decision Making/ A&P                           Medical Decision Making Amount and/or Complexity of Data Reviewed Labs: ordered.  Linda [name] is a 31 y.o. female with history of anxiety, depression, and Hashimoto's thyroiditis that presents with 3 days of worsening anxiety and depression. Will consult psych given worsening symptoms and psychiatric hotlines recommendation to visit the ED. Will order psychiatric order set including EKG, CBC, CMP, ETOH, urine drug screen, respiratory panel, and beta hCG.   2:53 PM Our team spoke with psychiatry who agreed to come see the patient in the ED.   3:05 PM Care transferred to afternoon ED team. Afternoon ED team will follow the patients results and recommendations from psychiatry.         Final Clinical Impression(s) / ED Diagnoses Final diagnoses:  None    Rx / DC Orders ED Discharge Orders     None         Larie Mathes, 27,  MD 05/16/22 1501    07/17/22, MD 05/16/22 1506    [place], 07/17/22, MD 05/20/22 1640

## 2022-05-16 NOTE — Discharge Instructions (Signed)
You were seen in the emergency department for evaluation of increased depression and anxiety.  You were evaluated by behavioral health and they felt you are safe for discharge to follow-up with your treatment team.  Please contact them as soon as possible.  Return to the emergency department if any worsening or concerning symptoms.

## 2022-05-16 NOTE — ED Provider Notes (Signed)
signout from Dr. Julieanne Manson.  31 year old female with history of depression here with increased depression symptoms after her Lexapro was increased.  No SI HI.  Plan is to follow-up on screening labs and mental health recommendations. Physical Exam  BP (!) 144/92   Pulse 76   Temp 97.7 F (36.5 C) (Oral)   Resp 20   Ht 5\' 5"  (1.651 m)   Wt 95.3 kg   SpO2 97%   BMI 34.95 kg/m   Physical Exam  Procedures  Procedures  ED Course / MDM    Medical Decision Making Amount and/or Complexity of Data Reviewed Labs: ordered.  Risk OTC drugs. Prescription drug management.  Patient seen and evaluated by TTS.  They are recommending outpatient treatment with her treatment team.       , MD 05/17/22 1120

## 2022-05-22 ENCOUNTER — Encounter (HOSPITAL_COMMUNITY): Payer: Self-pay | Admitting: *Deleted

## 2022-05-22 ENCOUNTER — Other Ambulatory Visit: Payer: Self-pay

## 2022-05-22 ENCOUNTER — Emergency Department (HOSPITAL_COMMUNITY)
Admission: EM | Admit: 2022-05-22 | Discharge: 2022-05-23 | Disposition: A | Discharge status: Other Acute Inpatient Hospital | Payer: Medicaid Other | Attending: Emergency Medicine | Admitting: Emergency Medicine

## 2022-05-22 DIAGNOSIS — R45851 Suicidal ideations: Secondary | ICD-10-CM | POA: Diagnosis not present

## 2022-05-22 DIAGNOSIS — F419 Anxiety disorder, unspecified: Secondary | ICD-10-CM | POA: Diagnosis present

## 2022-05-22 DIAGNOSIS — R21 Rash and other nonspecific skin eruption: Secondary | ICD-10-CM | POA: Diagnosis not present

## 2022-05-22 DIAGNOSIS — F411 Generalized anxiety disorder: Secondary | ICD-10-CM | POA: Insufficient documentation

## 2022-05-22 DIAGNOSIS — D72829 Elevated white blood cell count, unspecified: Secondary | ICD-10-CM | POA: Insufficient documentation

## 2022-05-22 DIAGNOSIS — M797 Fibromyalgia: Secondary | ICD-10-CM | POA: Diagnosis not present

## 2022-05-22 DIAGNOSIS — Z79899 Other long term (current) drug therapy: Secondary | ICD-10-CM | POA: Insufficient documentation

## 2022-05-22 DIAGNOSIS — Z20822 Contact with and (suspected) exposure to covid-19: Secondary | ICD-10-CM | POA: Diagnosis not present

## 2022-05-22 DIAGNOSIS — E063 Autoimmune thyroiditis: Secondary | ICD-10-CM | POA: Insufficient documentation

## 2022-05-22 DIAGNOSIS — E7800 Pure hypercholesterolemia, unspecified: Secondary | ICD-10-CM | POA: Diagnosis not present

## 2022-05-22 DIAGNOSIS — E669 Obesity, unspecified: Secondary | ICD-10-CM | POA: Insufficient documentation

## 2022-05-22 DIAGNOSIS — F410 Panic disorder [episodic paroxysmal anxiety] without agoraphobia: Secondary | ICD-10-CM

## 2022-05-22 DIAGNOSIS — F3181 Bipolar II disorder: Secondary | ICD-10-CM | POA: Insufficient documentation

## 2022-05-22 DIAGNOSIS — K219 Gastro-esophageal reflux disease without esophagitis: Secondary | ICD-10-CM | POA: Insufficient documentation

## 2022-05-22 DIAGNOSIS — K589 Irritable bowel syndrome without diarrhea: Secondary | ICD-10-CM | POA: Insufficient documentation

## 2022-05-22 LAB — BASIC METABOLIC PANEL
Anion gap: 10 (ref 5–15)
BUN: 7 mg/dL (ref 6–20)
CO2: 26 mmol/L (ref 22–32)
Calcium: 9.4 mg/dL (ref 8.9–10.3)
Chloride: 103 mmol/L (ref 98–111)
Creatinine, Ser: 0.57 mg/dL (ref 0.44–1.00)
GFR, Estimated: >60 mL/min (ref >60)
Glucose, Bld: 97 mg/dL (ref 70–99)
Potassium: 3.6 mmol/L (ref 3.5–5.1)
Sodium: 139 mmol/L (ref 135–145)

## 2022-05-22 LAB — CBC WITH DIFFERENTIAL/PLATELET
Abs Immature Granulocytes: 0.04 10*3/uL (ref 0.00–0.07)
Basophils Absolute: 0.1 10*3/uL (ref 0.0–0.1)
Basophils Relative: 1 %
Eosinophils Absolute: 0.1 10*3/uL (ref 0.0–0.5)
Eosinophils Relative: 1 %
HCT: 39.9 % (ref 36.0–46.0)
Hemoglobin: 13 g/dL (ref 12.0–15.0)
Immature Granulocytes: 0 %
Lymphocytes Relative: 19 %
Lymphs Abs: 2 10*3/uL (ref 0.7–4.0)
MCH: 28 pg (ref 26.0–34.0)
MCHC: 32.6 g/dL (ref 30.0–36.0)
MCV: 85.8 fL (ref 80.0–100.0)
Monocytes Absolute: 0.8 10*3/uL (ref 0.1–1.0)
Monocytes Relative: 8 %
Neutro Abs: 7.7 10*3/uL (ref 1.7–7.7)
Neutrophils Relative %: 71 %
Platelets: 315 10*3/uL (ref 150–400)
RBC: 4.65 MIL/uL (ref 3.87–5.11)
RDW: 13.4 % (ref 11.5–15.5)
WBC: 10.8 10*3/uL — ABNORMAL HIGH (ref 4.0–10.5)
nRBC: 0 % (ref 0.0–0.2)

## 2022-05-22 LAB — ETHANOL: Alcohol, Ethyl (B): <10 mg/dL (ref <10)

## 2022-05-22 LAB — RAPID URINE DRUG SCREEN, HOSP PERFORMED
Amphetamines: NOT DETECTED
Barbiturates: NOT DETECTED
Benzodiazepines: POSITIVE — AB
Cocaine: NOT DETECTED
Opiates: NOT DETECTED
Tetrahydrocannabinol: NOT DETECTED

## 2022-05-22 LAB — PREGNANCY, URINE: Preg Test, Ur: NEGATIVE

## 2022-05-22 MED ORDER — RISPERIDONE 1 MG PO TBDP: 2.0000 mg | ORAL_TABLET | Freq: Three times a day (TID) | ORAL | Status: DC | PRN

## 2022-05-22 MED ORDER — ACETAMINOPHEN 325 MG PO TABS
650.0000 mg | ORAL_TABLET | Freq: Four times a day (QID) | ORAL | Status: DC | PRN
  Administered 2022-05-22: 650 mg via ORAL
  Filled 2022-05-22: qty 2

## 2022-05-22 MED ORDER — LEVOTHYROXINE SODIUM 50 MCG PO TABS: 200.0000 ug | ORAL_TABLET | Freq: Every day | ORAL | Status: DC

## 2022-05-22 MED ORDER — CALCIUM CARBONATE ANTACID 500 MG PO CHEW
1.0000 | CHEWABLE_TABLET | Freq: Three times a day (TID) | ORAL | Status: DC
  Administered 2022-05-22: 200 mg via ORAL
  Filled 2022-05-22: qty 1

## 2022-05-22 MED ORDER — IBUPROFEN 800 MG PO TABS: 800.0000 mg | ORAL_TABLET | Freq: Three times a day (TID) | ORAL | Status: DC

## 2022-05-22 MED ORDER — LORAZEPAM 1 MG PO TABS
1.0000 mg | ORAL_TABLET | ORAL | Status: AC | PRN
  Administered 2022-05-23: 1 mg via ORAL
  Filled 2022-05-22: qty 1

## 2022-05-22 MED ORDER — ZIPRASIDONE MESYLATE 20 MG IM SOLR: 20.0000 mg | INTRAMUSCULAR | Status: DC | PRN

## 2022-05-22 MED ORDER — ZOLPIDEM TARTRATE 5 MG PO TABS
5.0000 mg | ORAL_TABLET | Freq: Every evening | ORAL | Status: DC | PRN
Start: 2022-05-22 — End: 2022-05-23

## 2022-05-22 MED ORDER — VENLAFAXINE HCL ER 37.5 MG PO CP24: 150.0000 mg | ORAL_CAPSULE | Freq: Every day | ORAL | Status: DC

## 2022-05-22 MED ORDER — VENLAFAXINE HCL ER 37.5 MG PO CP24: 75.0000 mg | ORAL_CAPSULE | Freq: Every day | ORAL | Status: DC

## 2022-05-22 MED ORDER — LORAZEPAM 1 MG PO TABS
1.0000 mg | ORAL_TABLET | Freq: Once | ORAL | Status: AC
  Administered 2022-05-22: 1 mg via ORAL
  Filled 2022-05-22: qty 1

## 2022-05-22 MED ORDER — ALBUTEROL SULFATE HFA 108 (90 BASE) MCG/ACT IN AERS: 1.0000 | INHALATION_SPRAY | Freq: Four times a day (QID) | RESPIRATORY_TRACT | Status: DC | PRN

## 2022-05-22 MED ORDER — CALCIUM CARBONATE ANTACID 500 MG PO CHEW: 1.0000 | CHEWABLE_TABLET | Freq: Three times a day (TID) | ORAL | Status: DC

## 2022-05-22 NOTE — ED Notes (Signed)
Had pt dress out in purple scrubs, labeled bag of belongings put in locker and labeled locker.

## 2022-05-22 NOTE — ED Triage Notes (Signed)
Pt tearful and states she is going through a med change and having severe anxiety. Pt admits to having SI, denies any plan.

## 2022-05-22 NOTE — ED Notes (Signed)
Tts started

## 2022-05-22 NOTE — ED Notes (Signed)
05/12/22   Stopped taking: Lexapro 15mg   Started taking: Cymbalta 30mg  Trileptal 300mg  Gabitril 2mg 

## 2022-05-22 NOTE — ED Notes (Signed)
Took pt a warm blanket and pillow

## 2022-05-22 NOTE — ED Provider Notes (Signed)
[facility] EMERGENCY DEPARTMENT Provider Note   CSN: [id number] Arrival date & time: 05/22/22  1600     History  Chief Complaint  Patient presents with   Anxiety    Linda [name] is a 31 y.o. female.  HPI Patient with a history of anxiety, depression presents with female companion who assists with history.  She is here due to anxiousness, suicidal ideation.  She notes that she recently had a change in medication, stopping Lexapro and starting new mood stabilizers.  However, over the past 3 days she has been progressively more anxious, unsettled, and acknowledges suicidal ideation. No physical pain.    Home Medications Prior to Admission medications   Medication Sig Start Date End Date Taking? Authorizing Provider  albuterol (VENTOLIN HFA) 108 (90 Base) MCG/ACT inhaler Inhale 1-2 puffs into the lungs every 6 (six) hours as needed for wheezing or shortness of breath. 09/13/21   Wallis Bamberg, PA-C  benzonatate (TESSALON) 100 MG capsule Take 1-2 capsules (100-200 mg total) by mouth 3 (three) times daily as needed for cough. 09/13/21   Wallis Bamberg, PA-C  fexofenadine (ALLEGRA) 180 MG tablet Take 180 mg by mouth daily as needed for allergies.     [provider]  ibuprofen (ADVIL) 800 MG tablet Take 1 tablet (800 mg total) by mouth 3 (three) times daily. Patient taking differently: Take 800 mg by mouth as needed. 03/01/21   Mare Ferrari, PA-C  levothyroxine (SYNTHROID) 200 MCG tablet Take 200 mcg by mouth daily before breakfast.    [provider]  Multiple Vitamin (MULTIVITAMIN WITH MINERALS) TABS tablet Take 1 tablet by mouth daily.    [provider]  promethazine-dextromethorphan (PROMETHAZINE-DM) 6.25-15 MG/5ML syrup Take 5 mLs by mouth at bedtime as needed for cough. 09/13/21   Wallis Bamberg, PA-C  pseudoephedrine (SUDAFED) 60 MG tablet Take 1 tablet (60 mg total) by mouth every 8 (eight) hours as needed for congestion. 09/13/21   Wallis Bamberg, PA-C   SELENIUM PO Take by mouth daily.    [provider]  venlafaxine XR (EFFEXOR-XR) 150 MG 24 hr capsule 225 mg daily. Take along with 75 mg cap 04/27/21   Neysa Hotter, MD  venlafaxine XR (EFFEXOR-XR) 75 MG 24 hr capsule 225 mg daily. Take along with 150 mg cap 04/27/21   Neysa Hotter, MD  zolpidem (AMBIEN) 10 MG tablet Take 0.5 tablets (5 mg total) by mouth at bedtime as needed for sleep. 06/25/21 08/24/21  Neysa Hotter, MD      Allergies    Ciprofloxacin, Sulfa antibiotics, Buspar [buspirone], and Levaquin [levofloxacin]    Review of Systems   Review of Systems  All other systems reviewed and are negative.   Physical Exam Updated Vital Signs BP (!) 130/100 (BP Location: Right Arm)   Pulse 89   Temp 98.2 F (36.8 C) (Oral)   Resp 18   Ht 5\' 5"  (1.651 m)   Wt 90.3 kg   LMP 05/08/2022 (Approximate)   SpO2 100%   BMI 33.13 kg/m  Physical Exam Vitals and nursing note reviewed.  Constitutional:      General: She is not in acute distress.    Appearance: She is well-developed.  HENT:     Head: Normocephalic and atraumatic.  Eyes:     Conjunctiva/sclera: Conjunctivae normal.  Cardiovascular:     Rate and Rhythm: Normal rate and regular rhythm.  Pulmonary:     Effort: Pulmonary effort is normal. No respiratory distress.     Breath sounds:  Normal breath sounds. No stridor.  Abdominal:     General: There is no distension.  Skin:    General: Skin is warm and dry.  Neurological:     Mental Status: She is alert and oriented to person, place, and time.     Cranial Nerves: No cranial nerve deficit.  Psychiatric:        Mood and Affect: Mood is anxious.        Thought Content: Thought content includes suicidal ideation.     ED Results / Procedures / Treatments   Labs (all labs ordered are listed, but only abnormal results are displayed) Labs Reviewed  CBC WITH DIFFERENTIAL/PLATELET  BASIC METABOLIC PANEL  ETHANOL  RAPID URINE DRUG SCREEN, HOSP PERFORMED   PREGNANCY, URINE    EKG 69 sinus T wave abnormality abnormal ECG  Radiology No results found.  Procedures Procedures    Medications Ordered in ED Medications  LORazepam (ATIVAN) tablet 1 mg (has no administration in time range)    ED Course/ Medical Decision Making/ A&P This patient with a Hx of IBS, depression, anxiety presents to the ED for concern of ideation, worsening anxiety, this involves an extensive number of treatment options, and is a complaint that carries with it a high risk of complications and morbidity.    The differential diagnosis includes medication related exacerbation versus intrinsic suicidal ideation, anxiety   Social Determinants of Health:  Psychiatric disease, IBS  Additional history obtained:  Additional history and/or information obtained from female companion at bedside, notable for HPI as above   After the initial evaluation, orders, including: Labs Ativan were initiated.   Patient placed on Cardiac and Pulse-Oximetry Monitors. The patient was maintained on a cardiac monitor.  The cardiac monitored showed an rhythm of 90 sinus normal The patient was also maintained on pulse oximetry. The readings were typically 100% room air normal   On repeat evaluation of the patient improved  Lab Tests:  I personally interpreted labs.  The pertinent results include: Unremarkable    Consultations Obtained:  I requested consultation with the behavioral health,  and their evaluation is pending  Dispostion / Final MDM:  After consideration of the diagnostic results and the patient's response to treatment, Young female with history of anxiety, depression, recent medication changes presents with anxiety, suicidal ideation.  She has no physical complaints and there is no evidence for new physical changes requiring intervention, with reassuring labs, vitals.  Patient medically cleared for behavior health evaluation.  Final Clinical Impression(s) / ED  Diagnoses Final diagnoses:  Suicidal ideation  Anxiety attack     Gerhard Munch, MD 05/22/22 1728

## 2022-05-23 ENCOUNTER — Other Ambulatory Visit (INDEPENDENT_AMBULATORY_CARE_PROVIDER_SITE_OTHER)
Admission: EM | Admit: 2022-05-23 | Discharge: 2022-05-26 | Disposition: A | Discharge status: Other Acute Inpatient Hospital | Payer: Medicaid Other | Source: Home / Self Care | Attending: Psychiatry | Admitting: Psychiatry

## 2022-05-23 DIAGNOSIS — D72829 Elevated white blood cell count, unspecified: Secondary | ICD-10-CM | POA: Insufficient documentation

## 2022-05-23 DIAGNOSIS — F332 Major depressive disorder, recurrent severe without psychotic features: Secondary | ICD-10-CM

## 2022-05-23 DIAGNOSIS — F411 Generalized anxiety disorder: Secondary | ICD-10-CM | POA: Insufficient documentation

## 2022-05-23 DIAGNOSIS — F3181 Bipolar II disorder: Secondary | ICD-10-CM | POA: Diagnosis present

## 2022-05-23 DIAGNOSIS — E669 Obesity, unspecified: Secondary | ICD-10-CM | POA: Insufficient documentation

## 2022-05-23 DIAGNOSIS — R21 Rash and other nonspecific skin eruption: Secondary | ICD-10-CM | POA: Insufficient documentation

## 2022-05-23 DIAGNOSIS — E7800 Pure hypercholesterolemia, unspecified: Secondary | ICD-10-CM

## 2022-05-23 DIAGNOSIS — E063 Autoimmune thyroiditis: Secondary | ICD-10-CM

## 2022-05-23 DIAGNOSIS — Z79899 Other long term (current) drug therapy: Secondary | ICD-10-CM | POA: Insufficient documentation

## 2022-05-23 DIAGNOSIS — K219 Gastro-esophageal reflux disease without esophagitis: Secondary | ICD-10-CM | POA: Insufficient documentation

## 2022-05-23 DIAGNOSIS — K589 Irritable bowel syndrome without diarrhea: Secondary | ICD-10-CM | POA: Insufficient documentation

## 2022-05-23 DIAGNOSIS — M797 Fibromyalgia: Secondary | ICD-10-CM

## 2022-05-23 LAB — LIPID PANEL
Cholesterol: 166 mg/dL (ref 0–200)
HDL: 32 mg/dL — ABNORMAL LOW (ref >40)
LDL Cholesterol: 121 mg/dL — ABNORMAL HIGH (ref 0–99)
Total CHOL/HDL Ratio: 5.2 RATIO
Triglycerides: 67 mg/dL (ref <150)
VLDL: 13 mg/dL (ref 0–40)

## 2022-05-23 LAB — RESP PANEL BY RT-PCR (FLU A&B, COVID) ARPGX2
Influenza A by PCR: NEGATIVE
Influenza B by PCR: NEGATIVE
SARS Coronavirus 2 by RT PCR: NEGATIVE

## 2022-05-23 LAB — TSH: TSH: 4.132 u[IU]/mL (ref 0.350–4.500)

## 2022-05-23 MED ORDER — TRAZODONE HCL 50 MG PO TABS
50.0000 mg | ORAL_TABLET | Freq: Every evening | ORAL | Status: DC | PRN
  Administered 2022-05-23: 50 mg via ORAL
  Filled 2022-05-23: qty 1

## 2022-05-23 MED ORDER — NYSTATIN 100000 UNIT/GM EX POWD
Freq: Two times a day (BID) | CUTANEOUS | Status: DC
  Filled 2022-05-23 (×2): qty 15

## 2022-05-23 MED ORDER — HYDROXYZINE HCL 25 MG PO TABS
25.0000 mg | ORAL_TABLET | Freq: Three times a day (TID) | ORAL | Status: DC | PRN
  Administered 2022-05-23 – 2022-05-26 (×2): 25 mg via ORAL
  Filled 2022-05-23 (×3): qty 1

## 2022-05-23 MED ORDER — PANTOPRAZOLE SODIUM 40 MG PO TBEC
80.0000 mg | DELAYED_RELEASE_TABLET | Freq: Every day | ORAL | Status: DC
  Administered 2022-05-23 – 2022-05-26 (×4): 80 mg via ORAL
  Filled 2022-05-23 (×4): qty 2

## 2022-05-23 MED ORDER — MAGNESIUM HYDROXIDE 400 MG/5ML PO SUSP: 30.0000 mL | Freq: Every day | ORAL | Status: DC | PRN

## 2022-05-23 MED ORDER — ONDANSETRON 4 MG PO TBDP
4.0000 mg | ORAL_TABLET | Freq: Three times a day (TID) | ORAL | Status: DC | PRN
  Administered 2022-05-23: 4 mg via ORAL
  Filled 2022-05-23: qty 1

## 2022-05-23 MED ORDER — TIAGABINE HCL 4 MG PO TABS
4.0000 mg | ORAL_TABLET | Freq: Every day | ORAL | Status: DC
  Administered 2022-05-23: 4 mg via ORAL
  Filled 2022-05-23 (×3): qty 1

## 2022-05-23 MED ORDER — ADULT MULTIVITAMIN W/MINERALS CH
1.0000 | ORAL_TABLET | Freq: Every day | ORAL | Status: DC
  Administered 2022-05-23 – 2022-05-26 (×4): 1 via ORAL
  Filled 2022-05-23 (×4): qty 1

## 2022-05-23 MED ORDER — LEVOTHYROXINE SODIUM 75 MCG PO TABS
175.0000 ug | ORAL_TABLET | Freq: Every day | ORAL | Status: DC
  Administered 2022-05-23 – 2022-05-26 (×4): 175 ug via ORAL
  Filled 2022-05-23 (×4): qty 1

## 2022-05-23 MED ORDER — ALBUTEROL SULFATE HFA 108 (90 BASE) MCG/ACT IN AERS: 1.0000 | INHALATION_SPRAY | Freq: Four times a day (QID) | RESPIRATORY_TRACT | Status: DC | PRN

## 2022-05-23 MED ORDER — LORAZEPAM 1 MG PO TABS
1.0000 mg | ORAL_TABLET | Freq: Once | ORAL | Status: AC
  Administered 2022-05-23: 1 mg via ORAL
  Filled 2022-05-23: qty 1

## 2022-05-23 MED ORDER — MULTIVITAMINS PO CAPS: 1.0000 | ORAL_CAPSULE | Freq: Every day | ORAL | Status: DC

## 2022-05-23 MED ORDER — LORATADINE 10 MG PO TABS
10.0000 mg | ORAL_TABLET | Freq: Every day | ORAL | Status: DC
Start: 2022-05-23 — End: 2022-05-26
  Administered 2022-05-23 – 2022-05-26 (×4): 10 mg via ORAL
  Filled 2022-05-23 (×4): qty 1

## 2022-05-23 MED ORDER — ACETAMINOPHEN 325 MG PO TABS: 650.0000 mg | ORAL_TABLET | Freq: Four times a day (QID) | ORAL | Status: DC | PRN

## 2022-05-23 MED ORDER — OXCARBAZEPINE 300 MG PO TABS
300.0000 mg | ORAL_TABLET | Freq: Every day | ORAL | Status: DC
  Administered 2022-05-23: 300 mg via ORAL
  Filled 2022-05-23: qty 1

## 2022-05-23 MED ORDER — ALUM & MAG HYDROXIDE-SIMETH 200-200-20 MG/5ML PO SUSP
30.0000 mL | ORAL | Status: DC | PRN
  Administered 2022-05-26: 30 mL via ORAL
  Filled 2022-05-23: qty 30

## 2022-05-23 MED ORDER — DULOXETINE HCL 30 MG PO CPEP
30.0000 mg | ORAL_CAPSULE | Freq: Every morning | ORAL | Status: DC
  Administered 2022-05-23 – 2022-05-26 (×4): 30 mg via ORAL
  Filled 2022-05-23 (×4): qty 1

## 2022-05-23 NOTE — Group Note (Signed)
Group Topic: Overcoming Obstacles  Group Date: 05/23/2022 Start Time: 0900 End Time: 5573 Facilitators: Emmit Pomfret D, NT  Department: [facility]  Number of Participants: 3  Group Focus: coping skills Treatment Modality:  Psychoeducation Interventions utilized were assignment Purpose: enhance coping skills  Name: Linda [name] Date of Birth: 22-Oct-1991  MR: [id number]    Level of Participation: moderate Quality of Participation: attentive Interactions with others: gave feedback Mood/Affect: appropriate Triggers (if applicable): n/a Cognition: coherent/clear Progress: Moderate Response: n/a Plan: follow-up needed  Patients Problems:  Patient Active Problem List   Diagnosis Date Noted   MDD (major depressive disorder), recurrent episode, severe (HCC) 05/23/2022   Hidradenitis suppurativa of left axilla 07/10/2020   Hepatomegaly 03/16/2020   Hepatic steatosis 03/16/2020   S/P laparoscopic appendectomy 10/16/2019   Acute appendicitis, uncomplicated    Ovarian cyst, right    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

## 2022-05-23 NOTE — ED Notes (Signed)
Pt ordered Nystatin powder to bil breast BID to start @2200 . Pt made aware and agreeable to tx. Will continue to monitor for safety.

## 2022-05-23 NOTE — ED Notes (Signed)
Pt in hallway looking at the books, calm and cooperative. No c/o pain or distress. Will continue to monitor for safety

## 2022-05-23 NOTE — ED Notes (Signed)
Pt admitted to [facility] due to panic attacks and SI with no plan. Pt A&O x4, calm and cooperative. Denies current SI/HI/AVH and is able to verbally contract for safety. Pt tolerated skin assessment and lab work well. Pt ambulated independently to unit. Oriented to unit/staff. Sandwich and juice provided per pt request. Pt states she must wait at least 15 minutes after eating to drink due to recent gastric bypass surgery and requires small, frequent meals throughout the day. No signs of acute distress noted. Will continue to monitor for safety.

## 2022-05-23 NOTE — ED Notes (Addendum)
Received call from [facility] lab stating they are unable to pull up lab work orders. Lipid panel and TSH will be drawn on [facility].

## 2022-05-23 NOTE — ED Notes (Addendum)
Pt states her Trileptal and Gabitril are supposed to be twice daily. Cecilio Asper, NP notified.

## 2022-05-23 NOTE — ED Notes (Signed)
Pt depart with safe transport.  Belongings given to transport.

## 2022-05-23 NOTE — ED Notes (Signed)
Call to [facility] lab to see if blood still available to add on TSH and lipid panel. Lab tech states to sign off orders and that she will check and call this RN back.

## 2022-05-23 NOTE — ED Notes (Signed)
Pt continue with flat affect. Denies SI/HI/AVH. Pt states, "I don't feel good mentally". When asked pt to elaborate on that statement, pt states, "I don't know. I just don't feel good in my head (shrugging shoulders)". Pt asked for muffin to eat. Tolerated well. When writer asked pt to elaborate on how she was feeling, pt said, "I'm just tired right now". Informed pt to notify staff if thoughts of harming self reoccur. Pt verbalized agreement. Will continue to monitor for safety.

## 2022-05-23 NOTE — ED Notes (Signed)
Pt sleeping@this  time. Breathing eve and unlabored. Will continue to monitor for safety

## 2022-05-23 NOTE — ED Notes (Signed)
Pt called RN in room to look at red rash under both breast. RN observed reddened area underneath bil breast, mild odor noted. Pt stated, "it itch under both of them. I think it's getting worse and I'm afraid it's turning into a yeast infection. Can I have something for the itching". Provided education on proper hygiene, infection control by keeping the area clean with mild soap, warm water and drying the area completely. RN notified NP of concerns for infection. Provider on site assessing the areas. Awaiting orders.

## 2022-05-23 NOTE — ED Notes (Signed)
Pt watching tv in no acute distress. Pt stated, "that medicine you gave me for anxiety works. Thank you. Support given. Informed pt to notify staff with any needs or concerns. Safety maintained.

## 2022-05-23 NOTE — ED Notes (Signed)
TeleSitter called to set up camera.

## 2022-05-23 NOTE — ED Notes (Signed)
Meal provided 

## 2022-05-23 NOTE — ED Notes (Signed)
Pt sleeping in no acute distress. RR even and unlabored. Safety maintained. 

## 2022-05-23 NOTE — ED Notes (Signed)
Pt approached nurses station asking to speak with Clinical research associate. Pt tearful. Walked to pt's room with pt for 1:1 rapport. Pt states, "I just don't feel like I'm going to get better. I feel so bad right now. I'm not suicidal but I feel like I'm letting my son and family down". Pt tearful. Support provided. Discussed medications (Cymbalta and Gabipril) peak times with pt. Pt asked RN to make contact with her husband, Linda [name], to update him as to her care. RN called pt's husband, with pt by RN side, and informed pt that staff was monitoring pt on her medications and provider would make adjustments as needed. Linda [name] stated pt just started Cymbalta and Gabipril on Thursday (3 days ago). RN informed Linda [name] that the medication could take up to several weeks for full effectiveness from depression and anxiety. Both pt and Linda [name] verbalized understanding. Pt was given Vistaril and Zofran for anxiety and nausea. Pt and Linda [name] verbalized appreciation for care given to pt. Informed Linda [name] to notify staff with any questions or concerns. Pt returned to her room after receiving prn medication approx. 45 min later. Pt voiced feeling "a little better and more relaxed". Will continue to monitor for safety.

## 2022-05-23 NOTE — BH Assessment (Addendum)
Comprehensive Clinical Assessment (CCA) Note  05/23/2022 Linda [name] [id number] Disposition:  Clinician discussed patient care with Linda Bering, NP.  She said patient was appropriate for Facility Based Crisis.  Pt needs to be observed there as her medication may need to be adjusted to address anxiety and depressive thoughts including suicidal thoughts (no plan).   Dr. Oletta Cohn and RN Sheralyn Boatman and [name] informed of disposition via secure messaging.    Pt is oritened and has poor eye contact.  She looks down a lot and is crying through most of the assessment.  Pt is very anxious.  She does not have any internal stimuli.  Pt does not evidence any delusional thought process.  Pt says she cannot eat or sleep.  Pt has telepsychiatry through a Dr. [name] with [facility].   Chief Complaint:  Chief Complaint  Patient presents with   Anxiety   Visit Diagnosis: MDD recurrent, moderate    CCA Screening, Triage and Referral (STR)  Patient Reported Information How did you hear about [place]? Family/Friend (Pt husband brought her to [facility].)  What Is the Reason for Your Visit/Call Today? Husband brought her to [facility].  She has been having panic attacks "real bad" and "not so good thoughts."  Pt panic attacks are characterized by rapid heartbeat, racing thoughts, shallow breathing, not thinking straight.  Pt says that the panic attacks last a long time and over multiple days.  Pt says that the SI started yesterday no plan however.  Pt has no previous suicide attempts.  Pt denies any HI or A/V hallucinations.  Pt denies any ETOH or other illicit drug use.  Paitent  says she went from Lexapro to Cymbalta.  She also started on Gabapril and another one she does not know the name of.  Paitent says the medication change started last Thursday (06/29).  She has not felt right since then and she says "it has just gotten worse."  Her psychiatrist is a Dr. [name] who she sees virtually.  She is frustrated with the  med changes.  Pt says she cannot eat or sleep.  Pt is tearful during assessment.  Pt expressed willingness to come to [facility].  She said "I just want to get some help."  How Long Has This Been Causing You Problems? 1 wk - 1 month  What Do You Feel Would Help You the Most Today? Medication(s); Treatment for Depression or other mood problem   Have You Recently Had Any Thoughts About Hurting Yourself? Yes  Are You Planning to Commit Suicide/Harm Yourself At This time? No   Have you Recently Had Thoughts About Hurting Someone Linda [name]? No  Are You Planning to Harm Someone at This Time? No  Explanation: No data recorded  Have You Used Any Alcohol or Drugs in the Past 24 Hours? No  How Long Ago Did You Use Drugs or Alcohol? No data recorded What Did You Use and How Much? No data recorded  Do You Currently Have a Therapist/Psychiatrist? Yes  Name of Therapist/Psychiatrist: Dr. [name] with [facility] (telehealth)   Have You Been Recently Discharged From Any Office Practice or Programs? No  Explanation of Discharge From Practice/Program: No data recorded    CCA Screening Triage Referral Assessment Type of Contact: Tele-Assessment  Telemedicine Service Delivery:   Is this Initial or Reassessment? Initial Assessment  Date Telepsych consult ordered in [facility]:  05/22/22  Time Telepsych consult ordered in [facility]:  1739  Location of Assessment: [facility]  Provider  Location: [facility] Assessment Services   Collateral Involvement: none   Does Patient Have a Automotive engineer Guardian? No data recorded Name and Contact of Legal Guardian: No data recorded If Minor and Not Living with Parent(s), Who has Custody? No data recorded Is CPS involved or ever been involved? Never  Is APS involved or ever been involved? Never   Patient Determined To Be At Risk for Harm To Self or Others Based on Review of Patient Reported Information or Presenting Complaint? Yes, for Self-Harm  Method: No data  recorded Availability of Means: No data recorded Intent: No data recorded Notification Required: No data recorded Additional Information for Danger to Others Potential: No data recorded Additional Comments for Danger to Others Potential: No data recorded Are There Guns or Other Weapons in Your Home? No data recorded Types of Guns/Weapons: No data recorded Are These Weapons Safely Secured?                            No data recorded Who Could Verify You Are Able To Have These Secured: No data recorded Do You Have any Outstanding Charges, Pending Court Dates, Parole/Probation? No data recorded Contacted To Inform of Risk of Harm To Self or Others: No data recorded   Does Patient Present under Involuntary Commitment? No  IVC Papers Initial File Date: No data recorded  [place] of Residence: [place]   Patient Currently Receiving the Following Services: Medication Management   Determination of Need: Urgent (48 hours)   Options For Referral: [facility]; Facility-Based Crisis     CCA Biopsychosocial Patient Reported Schizophrenia/Schizoaffective Diagnosis in Past: No   Strengths: kind, compassionate, very understanding, communication skills   Mental Health Symptoms Depression:   Change in energy/activity; Difficulty Concentrating; Tearfulness; Worthlessness; Fatigue; Hopelessness; Irritability   Duration of Depressive symptoms:    Mania:   Racing thoughts; Irritability   Anxiety:    Worrying; Difficulty concentrating; Irritability (history of panic attacks)   Psychosis:   None   Duration of Psychotic symptoms:    Trauma:   None (pt denies at time of assessment)   Obsessions:   None   Compulsions:   N/A   Inattention:   Symptoms before age 33 (History of ADHD)   Hyperactivity/Impulsivity:   Symptoms present before age 44 (History of ADHD)   Oppositional/Defiant Behaviors:   N/A   Emotional Irregularity:   Mood lability   Other Mood/Personality  Symptoms:  No data recorded   Mental Status Exam Appearance and self-care  Stature:   Average   Weight:   Average weight   Clothing:   Neat/clean   Grooming:   Normal   Cosmetic use:   None   Posture/gait:   Normal   Motor activity:   Restless   Sensorium  Attention:   Normal   Concentration:   Normal   Orientation:   X5   Recall/memory:   Normal   Affect and Mood  Affect:   Tearful; Depressed; Anxious   Mood:   Anxious; Depressed   Relating  Eye contact:   Normal   Facial expression:   Depressed; Anxious; Sad   Attitude toward examiner:   Cooperative   Thought and Language  Speech flow:  Clear and Coherent   Thought content:   Appropriate to Mood and Circumstances   Preoccupation:   None   Hallucinations:   None   Organization:  No data recorded  [employer]  of Knowledge:   Average   Intelligence:   Above Average   Abstraction:   Normal   Judgement:   Good   Reality Testing:   Variable   Insight:   Good   Decision Making:   Vacilates   Social Functioning  Social Maturity:   Responsible   Social Judgement:   Normal   Stress  Stressors:   Other (Comment) (pt unable to identify specific stressors at time of assessment)   Coping Ability:   Overwhelmed   Skill Deficits:   None   Supports:   Family     Religion:    Leisure/Recreation:    Exercise/Diet:     CCA Employment/Education Employment/Work Situation:    Education:     CCA Family/Childhood History Family and Relationship History:    Childhood History:     Child/Adolescent Assessment:     CCA Substance Use Alcohol/Drug Use:                           ASAM's:  Six Dimensions of Multidimensional Assessment  Dimension 1:  Acute Intoxication and/or Withdrawal Potential:      Dimension 2:  Biomedical Conditions and Complications:      Dimension 3:  Emotional, Behavioral, or Cognitive Conditions  and Complications:     Dimension 4:  Readiness to Change:     Dimension 5:  Relapse, Continued use, or Continued Problem Potential:     Dimension 6:  Recovery/Living Environment:     ASAM Severity Score:    ASAM Recommended Level of Treatment:     Substance use Disorder (SUD)    Recommendations for Services/Supports/Treatments:    Discharge Disposition:    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

## 2022-05-23 NOTE — ED Provider Notes (Signed)
Facility Based Crisis Admission H&P  Date: 05/23/22 Patient Name: Linda [name] MRN: [id number] Chief Complaint:  Chief Complaint  Patient presents with   Panic Attack   Suicidal    Panic attacks  Diagnoses:  Final diagnoses:  GAD (generalized anxiety disorder)  Severe episode of recurrent major depressive disorder, without psychotic features (HCC)    HPI: Linda [name] is a 31 y/o married female being admitted to [facility] for depression, anxiety and panic attacks.  Patient initially presented to [facility] ED due to worsening of anxiety with depression symptoms and was evaluated by TTS and recommended for inpatient treatment at [facility].  Patient states that she has had numerous medication trials with poor efficacy. Patient reports that she has had medication trials of depakote, lithium, celexa, wellbutrin, effexor, and most recently lexapro.  Patient states that her Lexapro was stopped and she was switched to Cymbalta 30 mg and also started on trileptal 300mg  about 3 to 4 days ago by her medication provider Dr. Matlock[facility] patient reports anxiety symptoms have been exacerbated since her medication change with multiple panic attacks throughout the day. Patient endorses depression and anxiety with racing thoughts and feeling overwhelmed.  Patient also complains of some nausea and recent diarrhea which may be related to her gastric bypass surgery.  Patient states that she was having multiple panic attacks a day that interfered with her job and she stated her employer let her go.  Patient has also recently had a decrease in her levothyroxine to 200 mcg to 175 mcg due to elevated TSH.  Patient also had gastric bypass surgery November 2022.Per chart review patient presented to [facility] long ED on 05/16/2022 with similar complaints of worsening anxiety symptoms and panic attacks.  Patient was evaluated by psychiatry and recommended to follow-up with her outpatient  psychiatrist.  Patient is alert oriented x4, cooperative but very anxious and tearful during the assessment.  Patient does not appear to be responding to any internal or external stimuli or experiencing any paranoia or delusions. Patient has some suicidal ideations but no plan or intent to do harm to herself and denies auditory visual hallucinations, paranoia or delusions.  Patient denies any illicit substance use or alcohol use. Patient is being admitted to [facility] for crisis management, stabilization and safety.  PHQ 2-9:  Flowsheet Row Office Visit from 03/07/2018 in [facility]  Thoughts that you would be better off dead, or of hurting yourself in some way Not at all  PHQ-9 Total Score 18       Flowsheet Row ED from 05/23/2022 in [facility] ED from 05/22/2022 in [facility] EMERGENCY DEPARTMENT ED from 05/16/2022 in [facility]  C-SSRS RISK CATEGORY High Risk High Risk Error: Question 6 not populated        Total Time spent with patient: 30 minutes  Musculoskeletal  Strength & Muscle Tone: within normal limits Gait & Station: normal Patient leans: N/A  Psychiatric Specialty Exam  Presentation General Appearance: Casual  Eye Contact:Good  Speech:Clear and Coherent  Speech Volume:Normal  Handedness:Right   Mood and Affect  Mood:Depressed; Anxious  Affect:Tearful   Thought Process  Thought Processes:Coherent  Descriptions of Associations:Intact  Orientation:Full (Time, Place and Person)  Thought Content:Logical  Diagnosis of Schizophrenia or Schizoaffective disorder in past: No   Hallucinations:Hallucinations: None  Ideas of Reference:None  Suicidal Thoughts:Suicidal Thoughts: Yes, Passive SI Passive Intent and/or Plan: Without Intent; Without Plan  Homicidal  Thoughts:Homicidal Thoughts: No   Sensorium  Memory:Immediate Good; Recent Good; Remote  Good  Judgment:Good  Insight:Good   Executive Functions  Concentration:Good  Attention Span:Good  Recall:Good  Fund of Knowledge:Good  Language:Good   Psychomotor Activity  Psychomotor Activity:Psychomotor Activity: Normal   Assets  Assets:Communication Skills; Desire for Improvement; Housing; Resilience   Sleep  Sleep:Sleep: Poor Number of Hours of Sleep: -1   Nutritional Assessment (For OBS and FBC admissions only) Has the patient had a weight loss or gain of 10 pounds or more in the last 3 months?: Yes Has the patient had a decrease in food intake/or appetite?: Yes Does the patient have dental problems?: Yes Does the patient have eating habits or behaviors that may be indicators of an eating disorder including binging or inducing vomiting?: Yes Has the patient recently lost weight without trying?: 0 Has the patient been eating poorly because of a decreased appetite?: 1 Malnutrition Screening Tool Score: 1    Physical Exam HENT:     Head: Normocephalic and atraumatic.     Nose: Nose normal.  Eyes:     Pupils: Pupils are equal, round, and reactive to light.  Cardiovascular:     Rate and Rhythm: Normal rate.  Pulmonary:     Effort: Pulmonary effort is normal.  Abdominal:     General: Abdomen is flat.  Musculoskeletal:     Cervical back: Normal range of motion.  Skin:    General: Skin is warm.  Neurological:     Mental Status: She is alert and oriented to person, place, and time.  Psychiatric:        Attention and Perception: Attention normal.        Mood and Affect: Mood is anxious and depressed. Affect is tearful.        Speech: Speech normal.        Behavior: Behavior is cooperative.        Thought Content: Thought content is not paranoid or delusional. Thought content does not include homicidal or suicidal ideation. Thought content does not include homicidal or suicidal plan.        Cognition and Memory: Cognition normal.        Judgment:  Judgment normal.    Review of Systems  Constitutional: Negative.   HENT: Negative.    Eyes: Negative.   Respiratory: Negative.    Cardiovascular: Negative.   Gastrointestinal: Negative.   Genitourinary: Negative.   Musculoskeletal: Negative.   Skin: Negative.   Neurological: Negative.   Endo/Heme/Allergies: Negative.   Psychiatric/Behavioral:  Positive for depression. The patient is nervous/anxious.     Blood pressure 126/79, pulse 62, temperature 98.1 F (36.7 C), temperature source Oral, resp. rate 18, last menstrual period 05/08/2022, SpO2 100 %. There is no height or weight on file to calculate BMI.  Past Psychiatric History: Dr. Kennyth ArnoldMatlock [facility]  Is the patient at risk to self? No  Has the patient been a risk to self in the past 6 months? No .    Has the patient been a risk to self within the distant past? No   Is the patient a risk to others? No   Has the patient been a risk to others in the past 6 months? No   Has the patient been a risk to others within the distant past? No   Past Medical History:  Past Medical History:  Diagnosis Date   Anxiety    Asthma    Depression    Heart murmur  Hypothyroidism    IBS (irritable bowel syndrome)    Interstitial cystitis     Past Surgical History:  Procedure Laterality Date   APPENDECTOMY     CESAREAN SECTION N/A 03/20/2013   Procedure: CESAREAN SECTION;  Surgeon: [name], MD;  Location: [facility];  Service: Obstetrics;  Laterality: N/A;   CESAREAN SECTION     gastric     LAPAROSCOPIC APPENDECTOMY N/A 10/16/2019   Procedure: APPENDECTOMY LAPAROSCOPIC;  Surgeon: Franky Macho, MD;  Location: [facility];  Service: General;  Laterality: N/A;   MYRINGOTOMY WITH TUBE PLACEMENT     NO PAST SURGERIES     TYMPANOSTOMY TUBE PLACEMENT     UPPER GI ENDOSCOPY      Family History:  Family History  Problem Relation Age of Onset   Cancer Paternal Uncle    COPD Maternal Grandfather    Stroke Paternal  Grandfather    Depression Mother    OCD Mother    Anxiety disorder Mother    Rheum arthritis Mother    Fibromyalgia Mother    Drug abuse Maternal Uncle    Depression Maternal Uncle    Anxiety disorder Maternal Uncle    Depression Maternal Grandmother    Anxiety disorder Brother    Healthy Brother    Dementia Paternal Grandmother    Autoimmune disease Father    ADD / ADHD Son    Healthy Son     Social History:  Social History   Socioeconomic History   Marital status: Married    Spouse name: Not on file   Number of children: Not on file   Years of education: Not on file   Highest education level: Not on file  Occupational History   Not on file  Tobacco Use   Smoking status: Every Day    Packs/day: 1.00    Types: Cigarettes    Start date: 03/15/2021   Smokeless tobacco: Never  Vaping Use   Vaping Use: Former  Substance and Sexual Activity   Alcohol use: Yes    Comment: occ   Drug use: No   Sexual activity: Yes    Partners: Male    Birth control/protection: None  Other Topics Concern   Not on file  Social History Narrative   ** Merged History Encounter **       Social Determinants of Health   Financial Resource Strain: Not on file  Food Insecurity: Not on file  Transportation Needs: Not on file  Physical Activity: Not on file  Stress: Not on file  Social Connections: Not on file  Intimate Partner Violence: Not on file    SDOH:  SDOH Screenings   Alcohol Screen: Not on file  Depression (PHQ2-9): Low Risk  (04/27/2021)   Depression (PHQ2-9)    PHQ-2 Score: 0  Financial Resource Strain: Not on file  Food Insecurity: Not on file  Housing: Not on file  Physical Activity: Not on file  Social Connections: Not on file  Stress: Not on file  Tobacco Use: High Risk (05/22/2022)   Patient History    Smoking Tobacco Use: Every Day    Smokeless Tobacco Use: Never    Passive Exposure: Not on file  Transportation Needs: Not on file    Last Labs:  Admission on  05/23/2022  Component Date Value Ref Range Status   Cholesterol 05/23/2022 166  0 - 200 mg/dL Final   Triglycerides 60/45/4098 67  <150 mg/dL Final   HDL 11/91/4782 32 (L)  >40 mg/dL Final  Total CHOL/HDL Ratio 05/23/2022 5.2  RATIO Final   VLDL 05/23/2022 13  0 - 40 mg/dL Final   LDL Cholesterol 05/23/2022 121 (H)  0 - 99 mg/dL Final   Comment:        Total Cholesterol/HDL:CHD Risk Coronary Heart Disease Risk Table                     Men   Women  1/2 Average Risk   3.4   3.3  Average Risk       5.0   4.4  2 X Average Risk   9.6   7.1  3 X Average Risk  23.4   11.0        Use the calculated Patient Ratio above and the CHD Risk Table to determine the patient's CHD Risk.        ATP III CLASSIFICATION (LDL):  <100     mg/dL   Optimal  161-096  mg/dL   Near or Above                    Optimal  130-159  mg/dL   Borderline  045-409  mg/dL   High  >811     mg/dL   Very High Performed at [facility], [address] [place], [place] [address]    TSH 05/23/2022 4.132  0.350 - 4.500 uIU/mL Final   Comment: Performed by a 3rd Generation assay with a functional sensitivity of <=0.01 uIU/mL. Performed at [facility], [address] [place], [place] [address]   Admission on 05/22/2022, Discharged on 05/23/2022  Component Date Value Ref Range Status   WBC 05/22/2022 10.8 (H)  4.0 - 10.5 K/uL Final   RBC 05/22/2022 4.65  3.87 - 5.11 MIL/uL Final   Hemoglobin 05/22/2022 13.0  12.0 - 15.0 g/dL Final   HCT 13/06/6577 39.9  36.0 - 46.0 % Final   MCV 05/22/2022 85.8  80.0 - 100.0 fL Final   MCH 05/22/2022 28.0  26.0 - 34.0 pg Final   MCHC 05/22/2022 32.6  30.0 - 36.0 g/dL Final   RDW 46/96/2952 13.4  11.5 - 15.5 % Final   Platelets 05/22/2022 315  150 - 400 K/uL Final   nRBC 05/22/2022 0.0  0.0 - 0.2 % Final   Neutrophils Relative % 05/22/2022 71  % Final   Neutro Abs 05/22/2022 7.7  1.7 - 7.7 K/uL Final   Lymphocytes Relative 05/22/2022 19  % Final   Lymphs Abs  05/22/2022 2.0  0.7 - 4.0 K/uL Final   Monocytes Relative 05/22/2022 8  % Final   Monocytes Absolute 05/22/2022 0.8  0.1 - 1.0 K/uL Final   Eosinophils Relative 05/22/2022 1  % Final   Eosinophils Absolute 05/22/2022 0.1  0.0 - 0.5 K/uL Final   Basophils Relative 05/22/2022 1  % Final   Basophils Absolute 05/22/2022 0.1  0.0 - 0.1 K/uL Final   Immature Granulocytes 05/22/2022 0  % Final   Abs Immature Granulocytes 05/22/2022 0.04  0.00 - 0.07 K/uL Final   Performed at [facility], [address] [place], [place] [address]   Sodium 05/22/2022 139  135 - 145 mmol/L Final   Potassium 05/22/2022 3.6  3.5 - 5.1 mmol/L Final   Chloride 05/22/2022 103  98 - 111 mmol/L Final   CO2 05/22/2022 26  22 - 32 mmol/L Final   Glucose, Bld 05/22/2022 97  70 - 99 mg/dL Final   Glucose reference range  applies only to samples taken after fasting for at least 8 hours.   BUN 05/22/2022 7  6 - 20 mg/dL Final   Creatinine, Ser 05/22/2022 0.57  0.44 - 1.00 mg/dL Final   Calcium 16/08/9603 9.4  8.9 - 10.3 mg/dL Final   GFR, Estimated 05/22/2022 >60  >60 mL/min Final   Comment: (NOTE) Calculated using the CKD-EPI Creatinine Equation (2021)    Anion gap 05/22/2022 10  5 - 15 Final   Performed at [facility], [address] [place], [place] [address]   Alcohol, Ethyl (B) 05/22/2022 <10  <10 mg/dL Final   Comment: (NOTE) Lowest detectable limit for serum alcohol is 10 mg/dL.  For medical purposes only. Performed at [facility], [address] [place], [place] [address]    Opiates 05/22/2022 NONE DETECTED  NONE DETECTED Final   Cocaine 05/22/2022 NONE DETECTED  NONE DETECTED Final   Benzodiazepines 05/22/2022 POSITIVE (A)  NONE DETECTED Final   Amphetamines 05/22/2022 NONE DETECTED  NONE DETECTED Final   Tetrahydrocannabinol 05/22/2022 NONE DETECTED  NONE DETECTED Final   Barbiturates 05/22/2022 NONE DETECTED  NONE DETECTED Final   Comment: (NOTE) DRUG SCREEN FOR MEDICAL PURPOSES ONLY.  IF CONFIRMATION  IS NEEDED FOR ANY PURPOSE, NOTIFY LAB WITHIN 5 DAYS.  LOWEST DETECTABLE LIMITS FOR URINE DRUG SCREEN Drug Class                     Cutoff (ng/mL) Amphetamine and metabolites    1000 Barbiturate and metabolites    200 Benzodiazepine                 200 Tricyclics and metabolites     300 Opiates and metabolites        300 Cocaine and metabolites        300 THC                            50 Performed at [facility], [address] [place], [place] [address]    Preg Test, Ur 05/22/2022 NEGATIVE  NEGATIVE Final   Comment:        THE SENSITIVITY OF THIS METHODOLOGY IS >20 mIU/mL. Performed at [facility], [address], [place], [place] [address]    SARS Coronavirus 2 by RT PCR 05/22/2022 NEGATIVE  NEGATIVE Final   Comment: (NOTE) SARS-CoV-2 target nucleic acids are NOT DETECTED.  The SARS-CoV-2 RNA is generally detectable in upper respiratory specimens during the acute phase of infection. The lowest concentration of SARS-CoV-2 viral copies this assay can detect is 138 copies/mL. A negative result does not preclude SARS-Cov-2 infection and should not be used as the sole basis for treatment or other patient management decisions. A negative result may occur with  improper specimen collection/handling, submission of specimen other than nasopharyngeal swab, presence of viral mutation(s) within the areas targeted by this assay, and inadequate number of viral copies(<138 copies/mL). A negative result must be combined with clinical observations, patient history, and epidemiological information. The expected result is Negative.  Fact Sheet for Patients:  BloggerCourse.com  Fact Sheet for Healthcare Providers:  SeriousBroker.it  This test is no                          t yet approved or cleared by the [place] FDA and  has been authorized for detection and/or diagnosis of SARS-CoV-2 by FDA under an Emergency Use Authorization  (EUA).  This EUA will remain  in effect (meaning this test can be used) for the duration of the COVID-19 declaration under Section 564(b)(1) of the Act, 21 U.S.C.section 360bbb-3(b)(1), unless the authorization is terminated  or revoked sooner.       Influenza A by PCR 05/22/2022 NEGATIVE  NEGATIVE Final   Influenza B by PCR 05/22/2022 NEGATIVE  NEGATIVE Final   Comment: (NOTE) The Xpert Xpress SARS-CoV-2/FLU/RSV plus assay is intended as an aid in the diagnosis of influenza from Nasopharyngeal swab specimens and should not be used as a sole basis for treatment. Nasal washings and aspirates are unacceptable for Xpert Xpress SARS-CoV-2/FLU/RSV testing.  Fact Sheet for Patients: BloggerCourse.com  Fact Sheet for Healthcare Providers: SeriousBroker.it  This test is not yet approved or cleared by the [place] FDA and has been authorized for detection and/or diagnosis of SARS-CoV-2 by FDA under an Emergency Use Authorization (EUA). This EUA will remain in effect (meaning this test can be used) for the duration of the COVID-19 declaration under Section 564(b)(1) of the Act, 21 U.S.C. section 360bbb-3(b)(1), unless the authorization is terminated or revoked.  Performed at [facility], [address], [place], [place] [address]   Admission on 05/16/2022, Discharged on 05/16/2022  Component Date Value Ref Range Status   SARS Coronavirus 2 by RT PCR 05/16/2022 NEGATIVE  NEGATIVE Final   Comment: (NOTE) SARS-CoV-2 target nucleic acids are NOT DETECTED.  The SARS-CoV-2 RNA is generally detectable in upper respiratory specimens during the acute phase of infection. The lowest concentration of SARS-CoV-2 viral copies this assay can detect is 138 copies/mL. A negative result does not preclude SARS-Cov-2 infection and should not be used as the sole basis for treatment or other patient management decisions. A negative result may occur  with  improper specimen collection/handling, submission of specimen other than nasopharyngeal swab, presence of viral mutation(s) within the areas targeted by this assay, and inadequate number of viral copies(<138 copies/mL). A negative result must be combined with clinical observations, patient history, and epidemiological information. The expected result is Negative.  Fact Sheet for Patients:  BloggerCourse.com  Fact Sheet for Healthcare Providers:  SeriousBroker.it  This test is no                          t yet approved or cleared by the [place] FDA and  has been authorized for detection and/or diagnosis of SARS-CoV-2 by FDA under an Emergency Use Authorization (EUA). This EUA will remain  in effect (meaning this test can be used) for the duration of the COVID-19 declaration under Section 564(b)(1) of the Act, 21 U.S.C.section 360bbb-3(b)(1), unless the authorization is terminated  or revoked sooner.       Influenza A by PCR 05/16/2022 NEGATIVE  NEGATIVE Final   Influenza B by PCR 05/16/2022 NEGATIVE  NEGATIVE Final   Comment: (NOTE) The Xpert Xpress SARS-CoV-2/FLU/RSV plus assay is intended as an aid in the diagnosis of influenza from Nasopharyngeal swab specimens and should not be used as a sole basis for treatment. Nasal washings and aspirates are unacceptable for Xpert Xpress SARS-CoV-2/FLU/RSV testing.  Fact Sheet for Patients: BloggerCourse.com  Fact Sheet for Healthcare Providers: SeriousBroker.it  This test is not yet approved or cleared by the [place] FDA and has been authorized for detection and/or diagnosis of SARS-CoV-2 by FDA under an Emergency Use Authorization (EUA). This EUA will remain in effect (meaning this test can be used) for the duration of the COVID-19 declaration  under Section 564(b)(1) of the Act, 21 U.S.C. section 360bbb-3(b)(1),  unless the authorization is terminated or revoked.  Performed at [facility], [address] [place], [place] [address]    Sodium 05/16/2022 141  135 - 145 mmol/L Final   Potassium 05/16/2022 3.4 (L)  3.5 - 5.1 mmol/L Final   Chloride 05/16/2022 107  98 - 111 mmol/L Final   CO2 05/16/2022 27  22 - 32 mmol/L Final   Glucose, Bld 05/16/2022 99  70 - 99 mg/dL Final   Glucose reference range applies only to samples taken after fasting for at least 8 hours.   BUN 05/16/2022 8  6 - 20 mg/dL Final   Creatinine, Ser 05/16/2022 0.77  0.44 - 1.00 mg/dL Final   Calcium 37/48/2707 9.3  8.9 - 10.3 mg/dL Final   Total Protein 86/75/4492 7.5  6.5 - 8.1 g/dL Final   Albumin 01/00/7121 4.0  3.5 - 5.0 g/dL Final   AST 97/58/8325 20  15 - 41 U/L Final   ALT 05/16/2022 24  0 - 44 U/L Final   Alkaline Phosphatase 05/16/2022 145 (H)  38 - 126 U/L Final   Total Bilirubin 05/16/2022 1.1  0.3 - 1.2 mg/dL Final   GFR, Estimated 05/16/2022 >60  >60 mL/min Final   Comment: (NOTE) Calculated using the CKD-EPI Creatinine Equation (2021)    Anion gap 05/16/2022 7  5 - 15 Final   Performed at [facility], [address], [place], [place] [address]   Alcohol, Ethyl (B) 05/16/2022 <10  <10 mg/dL Final   Comment: (NOTE) Lowest detectable limit for serum alcohol is 10 mg/dL.  For medical purposes only. Performed at [facility], [address] [place], [place] [address]    Opiates 05/16/2022 NONE DETECTED  NONE DETECTED Final   Cocaine 05/16/2022 NONE DETECTED  NONE DETECTED Final   Benzodiazepines 05/16/2022 POSITIVE (A)  NONE DETECTED Final   Amphetamines 05/16/2022 NONE DETECTED  NONE DETECTED Final   Tetrahydrocannabinol 05/16/2022 NONE DETECTED  NONE DETECTED Final   Barbiturates 05/16/2022 NONE DETECTED  NONE DETECTED Final   Comment: (NOTE) DRUG SCREEN FOR MEDICAL PURPOSES ONLY.  IF CONFIRMATION IS NEEDED FOR ANY PURPOSE, NOTIFY LAB WITHIN  5 DAYS.  LOWEST DETECTABLE LIMITS FOR URINE DRUG SCREEN Drug Class                     Cutoff (ng/mL) Amphetamine and metabolites    1000 Barbiturate and metabolites    200 Benzodiazepine                 200 Tricyclics and metabolites     300 Opiates and metabolites        300 Cocaine and metabolites        300 THC                            50 Performed at [facility], [address] [place], [place] [address]    WBC 05/16/2022 11.0 (H)  4.0 - 10.5 K/uL Final   RBC 05/16/2022 4.44  3.87 - 5.11 MIL/uL Final   Hemoglobin 05/16/2022 12.4  12.0 - 15.0 g/dL Final   HCT 68/06/8109 39.2  36.0 - 46.0 % Final   MCV 05/16/2022 88.3  80.0 - 100.0 fL Final   MCH 05/16/2022 27.9  26.0 - 34.0 pg Final   MCHC 05/16/2022 31.6  30.0 - 36.0 g/dL  Final   RDW 05/16/2022 13.4  11.5 - 15.5 % Final   Platelets 05/16/2022 318  150 - 400 K/uL Final   nRBC 05/16/2022 0.0  0.0 - 0.2 % Final   Neutrophils Relative % 05/16/2022 68  % Final   Neutro Abs 05/16/2022 7.5  1.7 - 7.7 K/uL Final   Lymphocytes Relative 05/16/2022 22  % Final   Lymphs Abs 05/16/2022 2.4  0.7 - 4.0 K/uL Final   Monocytes Relative 05/16/2022 6  % Final   Monocytes Absolute 05/16/2022 0.7  0.1 - 1.0 K/uL Final   Eosinophils Relative 05/16/2022 3  % Final   Eosinophils Absolute 05/16/2022 0.3  0.0 - 0.5 K/uL Final   Basophils Relative 05/16/2022 1  % Final   Basophils Absolute 05/16/2022 0.1  0.0 - 0.1 K/uL Final   Immature Granulocytes 05/16/2022 0  % Final   Abs Immature Granulocytes 05/16/2022 0.03  0.00 - 0.07 K/uL Final   Performed at [facility], [address], [place], [place] [address]   hCG, Conley Rolls, Quant, Kathie Rhodes 05/16/2022 <1  <5 mIU/mL Final   Comment:          GEST. AGE      CONC.  (mIU/mL)   <=1 WEEK        5 - 50     2 WEEKS       50 - 500     3 WEEKS       100 - 10,000     4 WEEKS     1,000 - 30,000     5 WEEKS     3,500 - 115,000   6-8 WEEKS     12,000 - 270,000    12  WEEKS     15,000 - 220,000        FEMALE AND NON-PREGNANT FEMALE:     LESS THAN 5 mIU/mL Performed at [facility], [address] [place], [place] [address]     Allergies: Ciprofloxacin, Sulfa antibiotics, Buspar [buspirone], and Levaquin [levofloxacin]  PTA Medications: (Not in a hospital admission)   Long Term Goals: Improvement in symptoms so as ready for discharge  Short Term Goals: Patient will verbalize feelings in meetings with treatment team members., Pt will complete the PHQ9 on admission, day 3 and discharge., and Patient will take medications as prescribed daily.  Medical Decision Making  Ludie D. Ijames is a 31 y/o married female being admitted to [facility] for depression, anxiety and panic attacks.     Recommendations  Based on my evaluation the patient does not appear to have an emergency medical condition.  Patient being admitted to [facility] [facility] for crisis management, stabilization and safety.  Jasper Riling, NP 05/23/22  7:34 AM

## 2022-05-23 NOTE — ED Notes (Signed)
[facility] called for transfer. 

## 2022-05-23 NOTE — ED Notes (Signed)
Patient was told lunch was here but she is not hungry

## 2022-05-23 NOTE — ED Notes (Signed)
Pt in the hallway walking around. No distress observed at this time.

## 2022-05-23 NOTE — ED Notes (Signed)
Pt aware of plan. Pt called husband and updated him

## 2022-05-24 ENCOUNTER — Encounter (HOSPITAL_COMMUNITY): Payer: Self-pay

## 2022-05-24 ENCOUNTER — Encounter (HOSPITAL_COMMUNITY): Payer: Self-pay | Admitting: Nurse Practitioner

## 2022-05-24 DIAGNOSIS — E063 Autoimmune thyroiditis: Secondary | ICD-10-CM

## 2022-05-24 DIAGNOSIS — E7800 Pure hypercholesterolemia, unspecified: Secondary | ICD-10-CM

## 2022-05-24 DIAGNOSIS — M797 Fibromyalgia: Secondary | ICD-10-CM

## 2022-05-24 LAB — T4, FREE: Free T4: 1.48 ng/dL — ABNORMAL HIGH (ref 0.61–1.12)

## 2022-05-24 MED ORDER — TIAGABINE HCL 4 MG PO TABS
4.0000 mg | ORAL_TABLET | Freq: Two times a day (BID) | ORAL | Status: DC
Start: 2022-05-24 — End: 2022-05-24
  Administered 2022-05-24: 4 mg via ORAL
  Filled 2022-05-24 (×3): qty 1

## 2022-05-24 MED ORDER — QUETIAPINE FUMARATE 100 MG PO TABS
100.0000 mg | ORAL_TABLET | Freq: Every day | ORAL | Status: DC
  Administered 2022-05-24: 100 mg via ORAL
  Filled 2022-05-24: qty 1

## 2022-05-24 MED ORDER — OXCARBAZEPINE 300 MG PO TABS
300.0000 mg | ORAL_TABLET | Freq: Two times a day (BID) | ORAL | Status: DC
  Administered 2022-05-24: 300 mg via ORAL
  Filled 2022-05-24: qty 1

## 2022-05-24 MED ORDER — GABAPENTIN 100 MG PO CAPS
100.0000 mg | ORAL_CAPSULE | Freq: Three times a day (TID) | ORAL | Status: DC
  Administered 2022-05-24 – 2022-05-26 (×6): 100 mg via ORAL
  Filled 2022-05-24 (×6): qty 1

## 2022-05-24 MED ORDER — FIXODENT COMPLETE CREA
TOPICAL_CREAM | Status: DC | PRN
Start: 2022-05-24 — End: 2022-05-26

## 2022-05-24 MED ORDER — ATORVASTATIN CALCIUM 10 MG PO TABS: 20.0000 mg | ORAL_TABLET | Freq: Every day | ORAL | Status: DC

## 2022-05-24 NOTE — ED Notes (Signed)
Blood specimens collected via L AC x 1 stick. Pt  tolerated well. Specimens taken to the lab and placed in the red bin to be picked by offsite distribution.

## 2022-05-24 NOTE — ED Notes (Signed)
Pt currently on phone speaking with family. No acute distress noted. Will continue to monitor for safety.

## 2022-05-24 NOTE — ED Notes (Signed)
Pt sleeping in no acute distress. RR even and unlabored. Safety maintained. 

## 2022-05-24 NOTE — ED Provider Notes (Addendum)
Behavioral Health Progress Note  Date and Time: 05/24/2022 9:06 AM Name: Linda [name] MRN:  [id number]  Subjective:   Linda [name] is a 31 yo female w/ hx of anxiety and depression, hypothyroidism, GERD, IBS presented to ED transferred to [facility] due to SI.  Patient reports to still have significant depressed mood as well as racing thoughts.  Denies SI/HI/AVH.  Reports poor sleep but appropriate appetite.  Reports that her sleep has been poor for some time now reporting 2 to 4 hours of sleep on average.  Reports that this has been persistent for the past 2 weeks after medication adjustments leading to multiple panic attacks per day as well as significantly to more depressed mood.  Reports changes including switching from Lexapro to Cymbalta as well as starting Trileptal as well as Gabitril.  Also takes Xanax for panic attacks.  Patient reports most of her psychiatric problems started around 9 years ago when she experienced postpartum depression.  Patient reports doing well for period of time of up to 7 years while on Celexa; however, patient reports Celexa "stopped working".  Patient reports having hypomanic episodes that she describes as increased in goal-directed activity, high energy, racing thoughts.  Patient denies having pressured speech or risky behavior.  Patient denies having previously been hospitalized for psychiatric condition.  Discussed medication adjustment of which patient was amenable to.   Diagnosis:  Final diagnoses:  GAD (generalized anxiety disorder)  Severe episode of recurrent major depressive disorder, without psychotic features (HCC)    Total Time spent with patient: 45 minutes  Past Psychiatric History: Anxiety and Depression  Past Medical History:  Past Medical History:  Diagnosis Date   Anxiety    Asthma    Depression    Heart murmur    Hypothyroidism    IBS (irritable bowel syndrome)    Interstitial cystitis      Family History:  Family  History  Problem Relation Age of Onset   Cancer Paternal Uncle    COPD Maternal Grandfather    Stroke Paternal Grandfather    Depression Mother    OCD Mother    Anxiety disorder Mother    Rheum arthritis Mother    Fibromyalgia Mother    Drug abuse Maternal Uncle    Depression Maternal Uncle    Anxiety disorder Maternal Uncle    Depression Maternal Grandmother    Anxiety disorder Brother    Healthy Brother    Dementia Paternal Grandmother    Autoimmune disease Father    ADD / ADHD Son    Healthy Son    Social History:  Social History   Substance and Sexual Activity  Alcohol Use Yes   Comment: occ     Social History   Substance and Sexual Activity  Drug Use No    Social History   Socioeconomic History   Marital status: Married    Spouse name: Not on file   Number of children: Not on file   Years of education: Not on file   Highest education level: Not on file  Occupational History   Not on file  Tobacco Use   Smoking status: Every Day    Packs/day: 1.00    Types: Cigarettes    Start date: 03/15/2021   Smokeless tobacco: Never  Vaping Use   Vaping Use: Former  Substance and Sexual Activity   Alcohol use: Yes    Comment: occ   Drug use: No   Sexual activity: Yes    Partners:  Male    Birth control/protection: None  Other Topics Concern   Not on file  Social History Narrative   ** Merged History Encounter **       Social Determinants of Health   Financial Resource Strain: Not on file  Food Insecurity: Not on file  Transportation Needs: Not on file  Physical Activity: Not on file  Stress: Not on file  Social Connections: Not on file     Sleep: Poor  Appetite:  Fair  ECG: T wave abnormality, new from prior ECG. Qtc 398. Medically cleared by ED prior to admission to psychiatry.  Labs  Pertinent labs Lab Results  Component Value Date   CHOL 166 05/23/2022   TRIG 67 05/23/2022   HDL 32 (L) 05/23/2022   CHOLHDL 5.2 05/23/2022   VLDL 13  05/23/2022   LDLCALC 121 (H) 05/23/2022    Physical Findings   GAD-7    Flowsheet Row Counselor [facility] Office Visit from 03/07/2018 in [facility] Office Visit from 11/22/2017 in [facility]  Total GAD-7 Score 12 14 15       PHQ2-9    Flowsheet Row ED from 05/23/2022 in [facility] Video Visit from 04/27/2021 in [facility] Video Visit from 01/26/2021 in [facility] Counselor from 01/05/2021 [facility] Office Visit from 03/07/2018 in [facility]  PHQ-2 Total Score 6 0 1 1 5   PHQ-9 Total Score 26 -- -- -- 18      Flowsheet Row ED from 05/23/2022 in [facility] ED from 05/22/2022 in [place] [facility] ED from 05/16/2022 in [facility]  C-SSRS RISK CATEGORY High Risk High Risk Error: Question 6 not populated        Musculoskeletal  Strength & Muscle Tone: within normal limits Gait & Station: normal Patient leans: N/A  Psychiatric Specialty Exam  Presentation  General Appearance: Casual  Eye Contact:Good  Speech:Clear and Coherent  Speech Volume:Normal  Handedness:Right   Mood and Affect  Mood:Depressed; Anxious  Affect:Tearful   Thought Process  Thought Processes:Coherent  Descriptions of Associations:Intact  Orientation:Full (Time, Place and Person)  Thought Content:Logical  Diagnosis of Schizophrenia or Schizoaffective disorder in past: No    Hallucinations:Hallucinations: None  Ideas of Reference:None  Suicidal Thoughts:Suicidal Thoughts: Yes, Passive SI Passive Intent and/or Plan: Without Intent; Without Plan  Homicidal Thoughts:Homicidal Thoughts: No   Sensorium  Memory:Immediate Good; Recent Good; Remote  Good  Judgment:Good  Insight:Good   Executive Functions  Concentration:Good  Attention Span:Good  Recall:Good  Fund of Knowledge:Good  Language:Good   Psychomotor Activity  Psychomotor Activity:Psychomotor Activity: Normal   Assets  Assets:Communication Skills; Desire for Improvement; Housing; Resilience   Sleep  Sleep: poor    Physical Exam  Physical Exam Constitutional:      Appearance: She is obese.  HENT:     Head: Normocephalic and atraumatic.     Nose: Nose normal.  Cardiovascular:     Rate and Rhythm: Normal rate and regular rhythm.     Pulses: Normal pulses.     Heart sounds: Normal heart sounds.  Pulmonary:     Effort: Pulmonary effort is normal.     Breath sounds: Normal breath sounds.  Abdominal:     General: Bowel sounds are normal.  Skin:    General: Skin is warm and dry.     Capillary Refill: Capillary refill takes  less than 2 seconds.  Neurological:     General: No focal deficit present.     Mental Status: She is oriented to person, place, and time. Mental status is at baseline.    Review of Systems  Respiratory:  Negative for shortness of breath.   Cardiovascular:  Negative for chest pain.  Gastrointestinal:  Negative for abdominal pain, constipation, diarrhea, heartburn, nausea and vomiting.  Neurological:  Negative for headaches.  Psychiatric/Behavioral:  Positive for depression. Negative for hallucinations, memory loss, substance abuse and suicidal ideas. The patient is nervous/anxious and has insomnia.    Blood pressure 98/64, pulse 60, temperature 98.3 F (36.8 C), temperature source Oral, resp. rate 16, last menstrual period 05/08/2022, SpO2 99 %. There is no height or weight on file to calculate BMI.  ASSESSMENT Patient is a 31 yo female w/ hx of anxiety and depression presenting with anxiety and depression, hypothyroidism, GERD, IBS presented to ED transferred to [facility] due to SI.   PLAN Type 2 Bipolar Disorder, depressed  episode without psychosis Psychiatric symptoms appear to have started postpartum.  Acutely, it appears that patient did not very well tolerate transition from Lexapro to Cymbalta as well as having started Trileptal.  Also unclear why patient is on tiagabine as she denies focal seizure. Could be for anxiety but many alternatives.  Per PDMP patient recently had both diazepam and alprazolam filled June 28 and 29 by same provider. -Continue cymbalta 30 mg qd -Discontinue trileptal -Start Seroquel 100 mg qhs.  R/B/SE discussed with patient and patient amenable to medication trial.  Target dose 300 mg nightly but will titrate slowly as patient has never been on antipsychotic -Continue tiagabine 4 mg bid  Seasonal Allergies -Continue Claritin daily  Dyslipidemia Elevated LDL cholesterol -Start Lipitor 20 mg daily  Hypothyroidism -Continue Synthroid 175 mcg daily -T3 and t4 pending  GERD -Continue Protonix 80 mg daily  Intertrigo -Continue 7 day nystatin powder  Leucocytosis Possibly secondary to intertrigo vs claritin. VSS on room air   Park Pope, MD 05/24/2022 9:06 AM

## 2022-05-24 NOTE — BH IP Treatment Plan (Addendum)
Interdisciplinary Treatment and Diagnostic Plan Update  05/24/2022 Time of Session: 10:15AM  Linda [name] MRN: [id number]  Diagnosis:  Final diagnoses:  GAD (generalized anxiety disorder)  Severe episode of recurrent major depressive disorder, without psychotic features (HCC)     Current Medications:  Current Facility-Administered Medications  Medication Dose Route Frequency Provider Last Rate Last Admin   acetaminophen (TYLENOL) tablet 650 mg  650 mg Oral Q6H PRN Bobbitt, Shalon E, NP       albuterol (VENTOLIN HFA) 108 (90 Base) MCG/ACT inhaler 1-2 puff  1-2 puff Inhalation Q6H PRN Bobbitt, Shalon E, NP       alum & mag hydroxide-simeth (MAALOX/MYLANTA) 200-200-20 MG/5ML suspension 30 mL  30 mL Oral Q4H PRN Bobbitt, Shalon E, NP       [START ON 05/25/2022] atorvastatin (LIPITOR) tablet 20 mg  20 mg Oral Daily Park Pope, MD       DULoxetine (CYMBALTA) DR capsule 30 mg  30 mg Oral q morning Bobbitt, Shalon E, NP   30 mg at 05/24/22 0947   Fixodent Complete CREA   Does not apply PRN Estella Husk, MD       hydrOXYzine (ATARAX) tablet 25 mg  25 mg Oral TID PRN Bobbitt, Shalon E, NP   25 mg at 05/23/22 1213   levothyroxine (SYNTHROID) tablet 175 mcg  175 mcg Oral Daily Bobbitt, Shalon E, NP   175 mcg at 05/24/22 0606   loratadine (CLARITIN) tablet 10 mg  10 mg Oral Daily Bobbitt, Shalon E, NP   10 mg at 05/24/22 0946   magnesium hydroxide (MILK OF MAGNESIA) suspension 30 mL  30 mL Oral Daily PRN Bobbitt, Shalon E, NP       multivitamin with minerals tablet 1 tablet  1 tablet Oral Daily Bobbitt, Shalon E, NP   1 tablet at 05/24/22 0946   nystatin (MYCOSTATIN/NYSTOP) topical powder   Topical BID Oneta Rack, NP   Given at 05/24/22 0947   ondansetron (ZOFRAN-ODT) disintegrating tablet 4 mg  4 mg Oral TID PRN Bobbitt, Shalon E, NP   4 mg at 05/23/22 1212   pantoprazole (PROTONIX) EC tablet 80 mg  80 mg Oral Daily Bobbitt, Shalon E, NP   80 mg at 05/24/22 0946   QUEtiapine  (SEROQUEL) tablet 100 mg  100 mg Oral Seabron Spates, MD       tiaGABine (GABITRIL) tablet 4 mg  4 mg Oral BID Estella Husk, MD   4 mg at 05/24/22 0947   traZODone (DESYREL) tablet 50 mg  50 mg Oral QHS PRN Bobbitt, Shalon E, NP   50 mg at 05/23/22 2112   Current Outpatient Medications  Medication Sig Dispense Refill   albuterol (VENTOLIN HFA) 108 (90 Base) MCG/ACT inhaler Inhale 1-2 puffs into the lungs every 6 (six) hours as needed for wheezing or shortness of breath. 18 g 0   ALPRAZolam (XANAX) 1 MG tablet Take 1 mg by mouth 2 (two) times daily as needed for anxiety.     DULoxetine (CYMBALTA) 30 MG capsule Take 30 mg by mouth every morning.     GABITRIL 4 MG tablet Take 4 mg by mouth 2 (two) times daily.     levothyroxine (SYNTHROID) 175 MCG tablet Take 175 mcg by mouth daily.     Multiple Vitamin (MULTIVITAMIN WITH MINERALS) TABS tablet Take 1 tablet by mouth daily.     omeprazole (PRILOSEC) 40 MG capsule Take 40 mg by mouth daily.     Oxcarbazepine (TRILEPTAL)  300 MG tablet Take 300 mg by mouth 2 (two) times daily as needed (anxiety).     PTA Medications: Prior to Admission medications   Medication Sig Start Date End Date Taking? Authorizing Provider  albuterol (VENTOLIN HFA) 108 (90 Base) MCG/ACT inhaler Inhale 1-2 puffs into the lungs every 6 (six) hours as needed for wheezing or shortness of breath. 09/13/21   Wallis Bamberg, PA-C  ALPRAZolam Prudy Feeler) 1 MG tablet Take 1 mg by mouth 2 (two) times daily as needed for anxiety. 02/09/22   [provider]  DULoxetine (CYMBALTA) 30 MG capsule Take 30 mg by mouth every morning. 05/17/22   [provider]  GABITRIL 4 MG tablet Take 4 mg by mouth 2 (two) times daily. 05/17/22   [provider]  levothyroxine (SYNTHROID) 175 MCG tablet Take 175 mcg by mouth daily. 02/15/22   [provider]  Multiple Vitamin (MULTIVITAMIN WITH MINERALS) TABS tablet Take 1 tablet by mouth daily.    [provider]   omeprazole (PRILOSEC) 40 MG capsule Take 40 mg by mouth daily. 03/08/22   [provider]  Oxcarbazepine (TRILEPTAL) 300 MG tablet Take 300 mg by mouth 2 (two) times daily as needed (anxiety). 05/21/22   [provider]    Patient Stressors: Medication change or noncompliance    Patient Strengths: Motivation for treatment/growth  Supportive family/friends   Treatment Modalities: Medication Management, Group therapy, Case management,  1 to 1 session with clinician, Psychoeducation, Recreational therapy.   Physician Treatment Plan for Primary and Secondary Diagnosis:  Final diagnoses:  GAD (generalized anxiety disorder)  Severe episode of recurrent major depressive disorder, without psychotic features (HCC)   Long Term Goal(s): Improvement in symptoms so as ready for discharge  Short Term Goals: Patient will verbalize feelings in meetings with treatment team members. Pt will complete the PHQ9 on admission, day 3 and discharge. Patient will take medications as prescribed daily.  Medication Management: Evaluate patient's response, side effects, and tolerance of medication regimen.  Therapeutic Interventions: 1 to 1 sessions, Unit Group sessions and Medication administration.  Evaluation of Outcomes: Not Progressing  LCSW Treatment Plan for Primary Diagnosis:  Final diagnoses:  GAD (generalized anxiety disorder)  Severe episode of recurrent major depressive disorder, without psychotic features (HCC)    Long Term Goal(s): Safe transition to appropriate next level of care at discharge.  Short Term Goals: Facilitate acceptance of mental health diagnosis and concerns through verbal commitment to aftercare plan and appointments at discharge. and Identify minimum of 2 triggers associated with mental health/substance abuse issues with treatment team members.  Therapeutic Interventions: Assess for all discharge needs, 1 to 1 time with Child psychotherapist, Explore available  resources and support systems, Assess for adequacy in community support network, Educate family and significant other(s) on suicide prevention, Complete Psychosocial Assessment, Interpersonal group therapy.  Evaluation of Outcomes: Not Progressing   Progress in Treatment: Attending groups: Yes. Participating in groups: Yes. Taking medication as prescribed: Yes. Toleration medication: Yes. Family/Significant other contact made: No, will contact:  no one at this time Patient understands diagnosis: Yes. Discussing patient identified problems/goals with staff: Yes. Medical problems stabilized or resolved: Yes. Denies suicidal/homicidal ideation: Yes. Issues/concerns per patient self-inventory: No. Other: None   New problem(s) identified: None  New Short Term/Long Term Goal(s): Dalayah reports her long term goal is to "feel better". overall.   Patient Goals: "To feel better"   Discharge Plan or Barriers: [place] was recommended for a higher level of care, requiring inpatient psychiatric  treatment. Emerald was accepted to [facility] for inpatient treatment.    Reason for Continuation of Hospitalization: Anxiety Depression Medication stabilization  Estimated Length of Stay: 2-3 days  Last 3 [place] Suicide Severity Risk Score: Flowsheet Row ED from 05/23/2022 in [facility] ED from 05/22/2022 in [place] EMERGENCY DEPARTMENT ED from 05/16/2022 in [facility]  HOSPITAL-EMERGENCY DEPT  C-SSRS RISK CATEGORY High Risk High Risk Error: Question 6 not populated       Last [facility] 2/9 Scores:    05/24/2022    8:08 AM 04/27/2021    4:13 PM 01/26/2021    1:34 PM  Depression screen PHQ 2/9  Decreased Interest 3 0 0  Down, Depressed, Hopeless 3 0 1  PHQ - 2 Score 6 0 1  Altered sleeping 3    Tired, decreased energy 3    Change in appetite 3    Feeling bad or failure about yourself  3    Trouble concentrating 2    Moving slowly or fidgety/restless 3     Suicidal thoughts 3    PHQ-9 Score 26    Difficult doing work/chores Extremely dIfficult      Scribe for Treatment Team: Maeola Sarah, LCSW 05/24/2022 11:58 AM

## 2022-05-24 NOTE — ED Notes (Signed)
Pt A&Ox4. Denies SI/HI/AVH.

## 2022-05-24 NOTE — Clinical Social Work Psych Note (Signed)
LCSW Initial Note  LCSW met with Linda [name] for introduction and to begin discussions regarding treatment and potential discharge planning. Linda [name] presented with a depressed affect, dysphoric mood, however she was cooperative with providers. Linda [name] denied having any SI, HI or AVH at this time.   Linda [name] reports she initially presented to the [facility] seeking assistance for increasing anxiety and worsening panic attacks. According to Linda [name]'s initial [facility] note, " Husband brought her to [place].  She has been having panic attacks "real bad" and "not so good thoughts."  Pt panic attacks are characterized by rapid heartbeat, racing thoughts, shallow breathing, not thinking straight.  Pt says that the panic attacks last a long time and over multiple days.  Pt says that the SI started yesterday no plan however.  Pt has no previous suicide attempts.  Pt denies any HI or A/V hallucinations.  Pt denies any ETOH or other illicit drug use.  Paitent  says she went from Lexapro to Cymbalta.  She also started on Gabapril and another one she does not know the name of.  Paitent says the medication change started last Thursday (06/29).  She has not felt right since then and she says "it has just gotten worse."  Her psychiatrist is a Dr. Junie Spencer who she sees virtually.  She is frustrated with the med changes.  Pt says she cannot eat or sleep".  Linda [name] shared that she just wants to feel better. Linda [name] did confirm that she recently had a medication change with with outpatient provider, Dr. Junie Spencer, however she did not think that change was beneficial. Linda [name] reports her symptomology increased after she received the medication changes.   LCSW shared with Linda [name] the importance to engage in therapy, in conjunction to medication management services. Linda [name] expressed understanding and was agreeable.   LCSW made a referral to [facility] for review. Linda [name] was approved to begin [facility] services on Thursday, 05/27/22.  Linda [name]  denied having any additional questions or concerns at this time.     LCSW will continue to follow.    Linda [name], MSW, LCSW Clinical Education officer, museum ([place]) [facility]

## 2022-05-24 NOTE — ED Notes (Signed)
Pt sitting in room. Denies SI/HI/AVH. Pt states, "I'm just sad. I feel like I'm not going to get better. The doctor told me that they think I have Bipolar and they want to start me on Seroquel at night". Support and Encouragement provided. RN provided education on indication of Seroquel. RN gave pt print out on medication Seroquel and Bipolar DO. Pt appreciative of help. Informed pt to notify staff with any needs or concerns. Will continue to monitor for safety.

## 2022-05-24 NOTE — ED Notes (Signed)
Notified that lunch is ready

## 2022-05-24 NOTE — ED Notes (Signed)
Pt sleeping@this time. Breathing even and unlabored will continue to monitor for safety 

## 2022-05-24 NOTE — ED Notes (Signed)
Pt is in the bed sleeping. Respirations are even and unlabored. No acute distress noted. Will continue to monitor for safety. 

## 2022-05-24 NOTE — ED Notes (Signed)
Pt is in the dinning room watching TV. Respirations are even and unlabored. No acute distress noted. Will continue to monitor for safety. 

## 2022-05-24 NOTE — ED Notes (Signed)
Notified pt breakfast is ready 

## 2022-05-25 LAB — T3: T3, Total: 104 ng/dL (ref 71–180)

## 2022-05-25 MED ORDER — SENNOSIDES-DOCUSATE SODIUM 8.6-50 MG PO TABS
1.0000 | ORAL_TABLET | Freq: Every day | ORAL | Status: DC
  Administered 2022-05-25 – 2022-05-26 (×2): 1 via ORAL
  Filled 2022-05-25 (×2): qty 1

## 2022-05-25 MED ORDER — QUETIAPINE FUMARATE 200 MG PO TABS
200.0000 mg | ORAL_TABLET | Freq: Every day | ORAL | Status: DC
  Administered 2022-05-25: 200 mg via ORAL
  Filled 2022-05-25: qty 1

## 2022-05-25 MED ORDER — POLYETHYLENE GLYCOL 3350 17 G PO PACK
17.0000 g | PACK | Freq: Every day | ORAL | Status: DC
  Administered 2022-05-25 – 2022-05-26 (×2): 17 g via ORAL
  Filled 2022-05-25 (×2): qty 1

## 2022-05-25 NOTE — ED Notes (Signed)
Pt is in the bed sleeping. Respirations are even and unlabored. No acute distress noted. Will continue to monitor for safety. 

## 2022-05-25 NOTE — Clinical Social Work Psych Note (Signed)
LCSW Update Note  Linda [name] reports minimal improvement in her mood or symptoms at this time. Linda [name] did report her anxiety/panic attacks have improved, however "I still do not feel good". Linda [name] continues to endorse worsening depressive symptoms.   Linda [name] shared that she is unsure if her medications were "working" at this time. Linda [name] denied having any SI, HI or AVH.  LCSW encouraged Linda [name] that it may take time for her symptoms to improve overall, however she was in the right place for help. Linda [name] expressed understanding.   LCSW reiterated that Linda [name] had been approved for [facility] services, starting Thursday (05/27/22) and if the patient was found not ready for discharge by then, that her appointment will be rescheduled. Linda [name] expressed understanding and was agreeable.   Linda [name] denied having any additional questions or concerns at this time.   LCSW will continue to follow.   Baldo Daub, MSW, LCSW Clinical Child psychotherapist (Facility Based Crisis) [facility]

## 2022-05-25 NOTE — ED Notes (Signed)
Pt sleeping@this time. Breathing even and unlabored. Will continue to monitor for safety 

## 2022-05-25 NOTE — Progress Notes (Signed)
Received Linda [name] this Am asleep in her bed, she woke up on her own, ate breakfast and remained in the dayroom watching the TV. Later she endorsed feeling depressed and a little less anxious today. She denied feeling suicidal. He affect is flat with soft spoken words. She was compliant with her medications.

## 2022-05-25 NOTE — Progress Notes (Signed)
Linda [name] attended a one to one group therapy session with a discussion and completion of her safety plan. She was given a copy and a copy was placed in her chart. She was tearful at intervals during her session, but stated feeling better afterwards. She remained OOB in the milkie throughout the day.

## 2022-05-25 NOTE — ED Notes (Signed)
Pt on phone with family crying. Pt denies SI, HI,AVH. Pt talks low and slow. Will continue to monitor for safety

## 2022-05-25 NOTE — ED Notes (Signed)
Pt is back on phone with family and is very tearful. Will continue to monitor for safety

## 2022-05-25 NOTE — ED Provider Notes (Addendum)
Behavioral Health Progress Note  Date and Time: 05/25/2022 10:00 AM Name: Linda [name] MRN:  [id number]  Subjective:   DRISHTI PEPPERMAN is a 31 yo female w/ hx of anxiety and depression, hypothyroidism, GERD, IBS presented to ED transferred to [facility] due to SI.  Seen and assessed at bedside. Patient reports feeling about the same with regards to depression and anxiety but this is unchanged for last week. Denies SI/HI/AVH. Does report sleeping better last night. Does report some dizziness and headache. Also reports having constipation with mild abdominal pain that has since improved. No BM for 4 days.    Diagnosis:  Final diagnoses:  GAD (generalized anxiety disorder)  Bipolar 2 disorder, major depressive episode (HCC)    Total Time spent with patient: 45 minutes   Sleep: Good  Appetite:  Fair  Labs  Pertinent labs Lab Results  Component Value Date   CHOL 166 05/23/2022   TRIG 67 05/23/2022   HDL 32 (L) 05/23/2022   CHOLHDL 5.2 05/23/2022   VLDL 13 05/23/2022   LDLCALC 121 (H) 05/23/2022     Musculoskeletal  Strength & Muscle Tone: within normal limits Gait & Station: normal Patient leans: N/A  Psychiatric Specialty Exam  Presentation  General Appearance: Casual  Eye Contact:Minimal  Speech:Slow  Speech Volume:Decreased  Handedness:Right   Mood and Affect  Mood:Anxious; Depressed  Affect:Flat   Thought Process  Thought Processes:Coherent  Descriptions of Associations:Intact  Orientation:Full (Time, Place and Person)  Thought Content:Logical  Diagnosis of Schizophrenia or Schizoaffective disorder in past: No    Hallucinations:Hallucinations: None  Ideas of Reference:None  Suicidal Thoughts:Suicidal Thoughts: No SI Passive Intent and/or Plan: Without Intent; Without Plan  Homicidal Thoughts:Homicidal Thoughts: No   Sensorium  Memory:Immediate Good; Recent Good; Remote Good  Judgment:Good  Insight:Fair   Executive Functions   Concentration:Fair  Attention Span:Fair  Recall:Good  Fund of Knowledge:Good  Language:Good   Psychomotor Activity  Psychomotor Activity:Psychomotor Activity: Decreased   Assets  Assets:Communication Skills; Desire for Improvement; Financial Resources/Insurance; Housing; Physical Health; Leisure Time; Social Support; Talents/Skills    Physical Exam  Physical Exam Constitutional:      Appearance: She is obese.  HENT:     Head: Normocephalic and atraumatic.     Nose: Nose normal.  Cardiovascular:     Rate and Rhythm: Normal rate and regular rhythm.     Pulses: Normal pulses.     Heart sounds: Normal heart sounds.  Pulmonary:     Effort: Pulmonary effort is normal.     Breath sounds: Normal breath sounds.  Abdominal:     General: Bowel sounds are normal.  Skin:    General: Skin is warm and dry.     Capillary Refill: Capillary refill takes less than 2 seconds.  Neurological:     General: No focal deficit present.     Mental Status: She is oriented to person, place, and time. Mental status is at baseline.    Review of Systems  Respiratory:  Negative for shortness of breath.   Cardiovascular:  Negative for chest pain.  Gastrointestinal:  Negative for abdominal pain, constipation, diarrhea, heartburn, nausea and vomiting.  Neurological:  Negative for headaches.  Psychiatric/Behavioral:  Positive for depression. Negative for hallucinations, memory loss, substance abuse and suicidal ideas. The patient is nervous/anxious and has insomnia.    Blood pressure 98/62, pulse (!) 56, temperature 98.1 F (36.7 C), temperature source Tympanic, resp. rate 18, last menstrual period 05/08/2022, SpO2 99 %. There is no height or weight  on file to calculate BMI.  ASSESSMENT Patient is a 31 yo female w/ hx of anxiety and depression presenting with anxiety and depression, hypothyroidism, GERD, IBS presented to ED transferred to [facility] due to SI.   PLAN Type 2 Bipolar Disorder, depressed  episode without psychosis GAD with panic attacks -Increase Seroquel from 100 to 200 mg qhs -Continue gabapentin 100 tid for anxiety -Continue Cymbalta 30 mg qd -Orthostatic vitals  Fibromyalgia -Continue gabapentin as above -Continue cymbalta as above  Constipation -Bowel regiment: Senokot and Miralax  Seasonal Allergies -Continue Claritin daily  Dyslipidemia ASCVD low risk. No need to start statin. -Recommend lifestyle modification -Monitor due to being on quetiapine  Hashimoto's Thyroiditis -Continue Synthroid 175 mcg daily  GERD -Continue Protonix 80 mg daily  Intertrigo, improving -Continue 7 day nystatin powder  Leucocytosis Possibly secondary to intertrigo. VSS on room air   Park Pope, MD 05/25/2022 10:00 AM

## 2022-05-26 ENCOUNTER — Inpatient Hospital Stay (HOSPITAL_COMMUNITY)
Admission: AD | Admit: 2022-05-26 | Discharge: 2022-05-31 | DRG: 885 | Disposition: A | Discharge status: Home / Self Care | Payer: Medicaid Other | Source: Intra-hospital | Attending: Psychiatry | Admitting: Psychiatry

## 2022-05-26 DIAGNOSIS — F4310 Post-traumatic stress disorder, unspecified: Secondary | ICD-10-CM | POA: Diagnosis present

## 2022-05-26 DIAGNOSIS — Z8261 Family history of arthritis: Secondary | ICD-10-CM

## 2022-05-26 DIAGNOSIS — M797 Fibromyalgia: Secondary | ICD-10-CM | POA: Diagnosis present

## 2022-05-26 DIAGNOSIS — F411 Generalized anxiety disorder: Secondary | ICD-10-CM | POA: Diagnosis present

## 2022-05-26 DIAGNOSIS — Z818 Family history of other mental and behavioral disorders: Secondary | ICD-10-CM

## 2022-05-26 DIAGNOSIS — F4010 Social phobia, unspecified: Secondary | ICD-10-CM | POA: Diagnosis present

## 2022-05-26 DIAGNOSIS — Z823 Family history of stroke: Secondary | ICD-10-CM

## 2022-05-26 DIAGNOSIS — F419 Anxiety disorder, unspecified: Secondary | ICD-10-CM | POA: Diagnosis present

## 2022-05-26 DIAGNOSIS — Z79890 Hormone replacement therapy (postmenopausal): Secondary | ICD-10-CM | POA: Diagnosis not present

## 2022-05-26 DIAGNOSIS — Z91018 Allergy to other foods: Secondary | ICD-10-CM | POA: Diagnosis not present

## 2022-05-26 DIAGNOSIS — F410 Panic disorder [episodic paroxysmal anxiety] without agoraphobia: Secondary | ICD-10-CM | POA: Diagnosis present

## 2022-05-26 DIAGNOSIS — G8929 Other chronic pain: Secondary | ICD-10-CM | POA: Diagnosis present

## 2022-05-26 DIAGNOSIS — Z79899 Other long term (current) drug therapy: Secondary | ICD-10-CM | POA: Diagnosis not present

## 2022-05-26 DIAGNOSIS — R202 Paresthesia of skin: Secondary | ICD-10-CM | POA: Diagnosis not present

## 2022-05-26 DIAGNOSIS — G4700 Insomnia, unspecified: Secondary | ICD-10-CM | POA: Diagnosis present

## 2022-05-26 DIAGNOSIS — F332 Major depressive disorder, recurrent severe without psychotic features: Secondary | ICD-10-CM | POA: Diagnosis not present

## 2022-05-26 DIAGNOSIS — Z888 Allergy status to other drugs, medicaments and biological substances status: Secondary | ICD-10-CM

## 2022-05-26 DIAGNOSIS — I951 Orthostatic hypotension: Secondary | ICD-10-CM | POA: Diagnosis not present

## 2022-05-26 DIAGNOSIS — Z825 Family history of asthma and other chronic lower respiratory diseases: Secondary | ICD-10-CM

## 2022-05-26 DIAGNOSIS — R200 Anesthesia of skin: Secondary | ICD-10-CM | POA: Diagnosis not present

## 2022-05-26 DIAGNOSIS — K219 Gastro-esophageal reflux disease without esophagitis: Secondary | ICD-10-CM | POA: Diagnosis present

## 2022-05-26 DIAGNOSIS — F3181 Bipolar II disorder: Principal | ICD-10-CM | POA: Diagnosis present

## 2022-05-26 DIAGNOSIS — E7800 Pure hypercholesterolemia, unspecified: Secondary | ICD-10-CM | POA: Diagnosis present

## 2022-05-26 DIAGNOSIS — E785 Hyperlipidemia, unspecified: Secondary | ICD-10-CM | POA: Diagnosis present

## 2022-05-26 DIAGNOSIS — Z803 Family history of malignant neoplasm of breast: Secondary | ICD-10-CM | POA: Diagnosis not present

## 2022-05-26 DIAGNOSIS — F17210 Nicotine dependence, cigarettes, uncomplicated: Secondary | ICD-10-CM | POA: Diagnosis present

## 2022-05-26 DIAGNOSIS — J45909 Unspecified asthma, uncomplicated: Secondary | ICD-10-CM | POA: Diagnosis present

## 2022-05-26 DIAGNOSIS — L304 Erythema intertrigo: Secondary | ICD-10-CM | POA: Diagnosis present

## 2022-05-26 DIAGNOSIS — E063 Autoimmune thyroiditis: Secondary | ICD-10-CM | POA: Diagnosis present

## 2022-05-26 DIAGNOSIS — Z881 Allergy status to other antibiotic agents status: Secondary | ICD-10-CM

## 2022-05-26 DIAGNOSIS — R45851 Suicidal ideations: Secondary | ICD-10-CM | POA: Diagnosis present

## 2022-05-26 HISTORY — DX: Generalized anxiety disorder: F41.1

## 2022-05-26 HISTORY — DX: Autoimmune thyroiditis: E06.3

## 2022-05-26 HISTORY — DX: Other specified behavioral and emotional disorders with onset usually occurring in childhood and adolescence: F98.8

## 2022-05-26 HISTORY — DX: Gastro-esophageal reflux disease without esophagitis: K21.9

## 2022-05-26 HISTORY — DX: Fibromyalgia: M79.7

## 2022-05-26 LAB — POC SARS CORONAVIRUS 2 AG: SARSCOV2ONAVIRUS 2 AG: NEGATIVE

## 2022-05-26 MED ORDER — LEVOTHYROXINE SODIUM 175 MCG PO TABS
175.0000 ug | ORAL_TABLET | Freq: Every day | ORAL | Status: DC
  Administered 2022-05-27 – 2022-05-28 (×2): 175 ug via ORAL
  Filled 2022-05-26 (×3): qty 1

## 2022-05-26 MED ORDER — PANTOPRAZOLE SODIUM 40 MG PO TBEC
80.0000 mg | DELAYED_RELEASE_TABLET | Freq: Every day | ORAL | Status: DC
  Administered 2022-05-27 – 2022-05-31 (×5): 80 mg via ORAL
  Filled 2022-05-26 (×8): qty 2

## 2022-05-26 MED ORDER — SIMETHICONE 80 MG PO CHEW
80.0000 mg | CHEWABLE_TABLET | Freq: Once | ORAL | Status: AC
  Administered 2022-05-26: 80 mg via ORAL
  Filled 2022-05-26: qty 1

## 2022-05-26 MED ORDER — ACETAMINOPHEN 325 MG PO TABS
650.0000 mg | ORAL_TABLET | Freq: Four times a day (QID) | ORAL | Status: DC | PRN
Start: 2022-05-26 — End: 2022-05-31

## 2022-05-26 MED ORDER — LORATADINE 10 MG PO TABS
10.0000 mg | ORAL_TABLET | Freq: Every day | ORAL | Status: DC
  Administered 2022-05-27 – 2022-05-31 (×5): 10 mg via ORAL
  Filled 2022-05-26 (×9): qty 1

## 2022-05-26 MED ORDER — QUETIAPINE FUMARATE 300 MG PO TABS
300.0000 mg | ORAL_TABLET | Freq: Every day | ORAL | Status: DC
  Administered 2022-05-26 – 2022-05-27 (×2): 300 mg via ORAL
  Filled 2022-05-26 (×4): qty 1

## 2022-05-26 MED ORDER — SIMETHICONE 80 MG PO CHEW
80.0000 mg | CHEWABLE_TABLET | Freq: Four times a day (QID) | ORAL | Status: DC | PRN
  Administered 2022-05-30: 80 mg via ORAL
  Filled 2022-05-26: qty 1

## 2022-05-26 MED ORDER — HYDROXYZINE HCL 25 MG PO TABS
25.0000 mg | ORAL_TABLET | Freq: Three times a day (TID) | ORAL | Status: DC | PRN
  Administered 2022-05-26 – 2022-05-27 (×2): 25 mg via ORAL
  Filled 2022-05-26 (×2): qty 1

## 2022-05-26 MED ORDER — QUETIAPINE FUMARATE 300 MG PO TABS: 300.0000 mg | ORAL_TABLET | Freq: Every day | ORAL | Status: DC

## 2022-05-26 MED ORDER — ALBUTEROL SULFATE HFA 108 (90 BASE) MCG/ACT IN AERS: 1.0000 | INHALATION_SPRAY | Freq: Four times a day (QID) | RESPIRATORY_TRACT | Status: DC | PRN

## 2022-05-26 MED ORDER — DULOXETINE HCL 30 MG PO CPEP
30.0000 mg | ORAL_CAPSULE | Freq: Every morning | ORAL | Status: DC
Start: 2022-05-27 — End: 2022-05-28
  Administered 2022-05-27 – 2022-05-28 (×2): 30 mg via ORAL
  Filled 2022-05-26 (×3): qty 1

## 2022-05-26 MED ORDER — NYSTATIN 100000 UNIT/GM EX POWD
Freq: Two times a day (BID) | CUTANEOUS | Status: AC
  Filled 2022-05-26: qty 15

## 2022-05-26 MED ORDER — POLYETHYLENE GLYCOL 3350 17 G PO PACK: 17.0000 g | PACK | Freq: Two times a day (BID) | ORAL | Status: DC

## 2022-05-26 MED ORDER — POLYETHYLENE GLYCOL 3350 17 G PO PACK
17.0000 g | PACK | Freq: Two times a day (BID) | ORAL | Status: DC
  Administered 2022-05-29 – 2022-05-30 (×2): 17 g via ORAL
  Filled 2022-05-26 (×13): qty 1

## 2022-05-26 MED ORDER — SENNOSIDES-DOCUSATE SODIUM 8.6-50 MG PO TABS
1.0000 | ORAL_TABLET | Freq: Two times a day (BID) | ORAL | Status: DC
  Filled 2022-05-26 (×9): qty 1

## 2022-05-26 MED ORDER — SENNOSIDES-DOCUSATE SODIUM 8.6-50 MG PO TABS: 1.0000 | ORAL_TABLET | Freq: Two times a day (BID) | ORAL | Status: DC

## 2022-05-26 MED ORDER — GABAPENTIN 100 MG PO CAPS
200.0000 mg | ORAL_CAPSULE | Freq: Three times a day (TID) | ORAL | Status: DC
  Administered 2022-05-26: 200 mg via ORAL
  Filled 2022-05-26: qty 2

## 2022-05-26 NOTE — ED Notes (Signed)
Pt is in the bed sleeping. Respirations are even and unlabored. No acute distress noted. Will continue to continue to monitor for safety.

## 2022-05-26 NOTE — ED Provider Notes (Signed)
[facility]BC/OBS ASAP Discharge Summary  Date and Time: 05/26/2022 1:57 PM  Name: Linda [name]  MRN:  [id number]   Discharge Diagnoses:  Final diagnoses:  GAD (generalized anxiety disorder)  Bipolar 2 disorder, major depressive episode ([place])    Subjective:  Seen and assessed at bedside.  Patient reports continuing to feel the same but does report anxiety appears to be improving.  Yesterday anxiety was fluctuant with sudden episodes of anxiety in the morning and afternoon.  Patient also was reported that she woke up this morning with elevated anxiety as well as heart racing.  Patient reports that hydroxyzine helped with it.   Patient clarified that she does have reports of intermittent SI but are passive in that she just wishes to no longer be alive so she could be absolved of depressed mood and anxiety.  She does report having brief thoughts she would commit suicide by medication overdose.  Denying HI/AVH.  Discussed transfer to [facility] for higher level of care for which patient was amenable to.  Stay Summary by Problem List Linda [name] is a 31 yo female w/ hx of GAD, postpartum depression, Hashimoto's thyroiditis, fibromyalgia, GERD, IBS presented to ED transferred to [facility] due to [place].  Plan is to transfer her to [facility]  for higher level of care. Hospital course is detailed below:  Type II bipolar disorder, depressive episode GAD Fibromyalgia Patient presents with SI, worsening anxiety and depressive symptoms.  Based on patient history, patient appears to have type II bipolar disorder and currently in a depressive episode. Patient was discontinued from home oxcarbazepine and started her on Seroquel nightly and titrated up to 300 mg nightly.  Patient was also transition from Gabitril to gabapentin for anxiety and titrated up to 200 mg 3 times daily.  Appeared to tolerate medication changes well.  Reports occasional dizziness patient was continued on recently prescribed Cymbalta.  Recommend titrating up  on Cymbalta at inpatient [facility].  Also recommend repeat EKG and orthostatic vitals to monitor for side effects.  On discharge from [facility], patient expressed interest in [place].   IBS Constipation Patient reports not having a bowel movement approximately 4 to 5 days.  Reports bloating as well.  Recommend MiraLAX and Senokot for bowel regimen and simethicone as needed for flatulence/bloating.  Intertrigo Reports bilateral rash underneath breast suspected to be intertrigo.  Patient was started on nystatin powder which appeared to aid with rash.  Recommended continue and complete 7-day course.  Leucocytosis Elevated WBC likely secondary to intertrigo versus stress response.  Hypercholesterolemia Obesity Found to have fasting lipid panel showing elevated cholesterol in the 120s.  ASCVD low risk so did not start her statin.  Consider GLP-1 agonist on discharge from [facility].  Nicotine Use Disorder Smokes 1 ppd for past year. Recommend NRT if patient expresses interest.  Other chronic conditions were medically managed with home medications and formulary alternatives as necessary (GERD, Hashimoto's thyroiditis, seasonal allergies)   Total Time spent with patient: 45 minutes  Past Psychiatric History: Postpartum depression, Anxiety, Panic attacks, Nicotine use Disorder Previous Psych Diagnoses: Postpartum depression, MDD, GAD w/ panic attack vs Panic Disorder Prior inpatient treatment: none History of suicide: none History of homicide: none Psychiatric medication history: Celexa (worked for 7 years following Postpartum depression but started to become ineffective), Gabitril, oxcarbazepine, lexapro Psychiatric medication compliance history: good Neuromodulation history: noe Current Psychiatrist: Dr. Omer Jack (requesting new psychiatrist following PHP) Current therapist: none (requesting therapist following PHP)   Past Medical History:  Past Medical  History:  Diagnosis Date   Anxiety    Asthma     Depression    Heart murmur    Hypothyroidism    IBS (irritable bowel syndrome)    Interstitial cystitis     Past Surgical History:  Procedure Laterality Date   APPENDECTOMY     CESAREAN SECTION N/A 03/20/2013   Procedure: CESAREAN SECTION;  Surgeon: [name], MD;  Location: [facility];  Service: Obstetrics;  Laterality: N/A;   CESAREAN SECTION     gastric     LAPAROSCOPIC APPENDECTOMY N/A 10/16/2019   Procedure: APPENDECTOMY LAPAROSCOPIC;  Surgeon: Franky Macho, MD;  Location: [facility];  Service: General;  Laterality: N/A;   MYRINGOTOMY WITH TUBE PLACEMENT     NO PAST SURGERIES     TYMPANOSTOMY TUBE PLACEMENT     UPPER GI ENDOSCOPY     Family History:  Family History  Problem Relation Age of Onset   Cancer Paternal Uncle    COPD Maternal Grandfather    Stroke Paternal Grandfather    Depression Mother    OCD Mother    Anxiety disorder Mother    Rheum arthritis Mother    Fibromyalgia Mother    Drug abuse Maternal Uncle    Depression Maternal Uncle    Anxiety disorder Maternal Uncle    Depression Maternal Grandmother    Anxiety disorder Brother    Healthy Brother    Dementia Paternal Grandmother    Autoimmune disease Father    ADD / ADHD Son    Healthy Son    Family Psychiatric History: refer to above Social History:  Social History   Substance and Sexual Activity  Alcohol Use Yes   Comment: occ     Social History   Substance and Sexual Activity  Drug Use No    Social History   Socioeconomic History   Marital status: Married    Spouse name: Not on file   Number of children: Not on file   Years of education: Not on file   Highest education level: Not on file  Occupational History   Not on file  Tobacco Use   Smoking status: Every Day    Packs/day: 1.00    Types: Cigarettes    Start date: 03/15/2021   Smokeless tobacco: Never  Vaping Use   Vaping Use: Former  Substance and Sexual Activity   Alcohol use: Yes    Comment: occ   Drug use: No    Sexual activity: Yes    Partners: Male    Birth control/protection: None  Other Topics Concern   Not on file  Social History Narrative   ** Merged History Encounter **       Social Determinants of Health   Financial Resource Strain: Not on file  Food Insecurity: Not on file  Transportation Needs: Not on file  Physical Activity: Not on file  Stress: Not on file  Social Connections: Not on file   SDOH:  SDOH Screenings   Alcohol Screen: Not on file  Depression (PHQ2-9): Medium Risk (05/24/2022)   Depression (PHQ2-9)    PHQ-2 Score: 26  Financial Resource Strain: Not on file  Food Insecurity: Not on file  Housing: Not on file  Physical Activity: Not on file  Social Connections: Not on file  Stress: Not on file  Tobacco Use: High Risk (05/24/2022)   Patient History    Smoking Tobacco Use: Every Day    Smokeless Tobacco Use: Never    Passive Exposure: Not on  file  Transportation Needs: Not on file    Tobacco Cessation:  N/A, patient does not currently use tobacco products  Current Medications:  Current Facility-Administered Medications  Medication Dose Route Frequency Provider Last Rate Last Admin   acetaminophen (TYLENOL) tablet 650 mg  650 mg Oral Q6H PRN Bobbitt, Shalon E, NP       albuterol (VENTOLIN HFA) 108 (90 Base) MCG/ACT inhaler 1-2 puff  1-2 puff Inhalation Q6H PRN Bobbitt, Shalon E, NP       alum & mag hydroxide-simeth (MAALOX/MYLANTA) 200-200-20 MG/5ML suspension 30 mL  30 mL Oral Q4H PRN Bobbitt, Shalon E, NP   30 mL at 05/26/22 1350   DULoxetine (CYMBALTA) DR capsule 30 mg  30 mg Oral q morning Bobbitt, Shalon E, NP   30 mg at 05/26/22 [id number]   Fixodent Complete CREA   Does not apply PRN Ival Bible, MD       gabapentin (NEURONTIN) capsule 200 mg  200 mg Oral TID France Ravens, MD       hydrOXYzine (ATARAX) tablet 25 mg  25 mg Oral TID PRN Bobbitt, Shalon E, NP   25 mg at 05/26/22 0748   levothyroxine (SYNTHROID) tablet 175 mcg  175 mcg Oral Daily  Bobbitt, Shalon E, NP   175 mcg at 05/26/22 0624   loratadine (CLARITIN) tablet 10 mg  10 mg Oral Daily Bobbitt, Shalon E, NP   10 mg at 05/26/22 [id number]   magnesium hydroxide (MILK OF MAGNESIA) suspension 30 mL  30 mL Oral Daily PRN Bobbitt, Shalon E, NP       multivitamin with minerals tablet 1 tablet  1 tablet Oral Daily Bobbitt, Shalon E, NP   1 tablet at 05/26/22 0904   nystatin (MYCOSTATIN/NYSTOP) topical powder   Topical BID Derrill Center, NP   Given at 05/26/22 0904   ondansetron (ZOFRAN-ODT) disintegrating tablet 4 mg  4 mg Oral TID PRN Bobbitt, Shalon E, NP   4 mg at 05/23/22 1212   pantoprazole (PROTONIX) EC tablet 80 mg  80 mg Oral Daily Bobbitt, Shalon E, NP   80 mg at 05/26/22 0905   polyethylene glycol (MIRALAX / GLYCOLAX) packet 17 g  17 g Oral BID France Ravens, MD       QUEtiapine (SEROQUEL) tablet 300 mg  300 mg Oral QHS France Ravens, MD       senna-docusate (Senokot-S) tablet 1 tablet  1 tablet Oral BID France Ravens, MD       traZODone (DESYREL) tablet 50 mg  50 mg Oral QHS PRN Bobbitt, Shalon E, NP   50 mg at 05/23/22 2112   Current Outpatient Medications  Medication Sig Dispense Refill   albuterol (VENTOLIN HFA) 108 (90 Base) MCG/ACT inhaler Inhale 1-2 puffs into the lungs every 6 (six) hours as needed for wheezing or shortness of breath. 18 g 0   ALPRAZolam (XANAX) 1 MG tablet Take 1 mg by mouth 2 (two) times daily as needed for anxiety.     DULoxetine (CYMBALTA) 30 MG capsule Take 30 mg by mouth every morning.     GABITRIL 4 MG tablet Take 4 mg by mouth 2 (two) times daily.     levothyroxine (SYNTHROID) 175 MCG tablet Take 175 mcg by mouth daily.     Multiple Vitamin (MULTIVITAMIN WITH MINERALS) TABS tablet Take 1 tablet by mouth daily.     omeprazole (PRILOSEC) 40 MG capsule Take 40 mg by mouth daily.     Oxcarbazepine (TRILEPTAL) 300 MG tablet Take  300 mg by mouth 2 (two) times daily as needed (anxiety).      PTA Medications: (Not in a hospital admission)        05/26/2022   11:15 AM 05/24/2022    8:08 AM 04/27/2021    4:13 PM  Depression screen PHQ 2/9  Decreased Interest 3 3 0  Down, Depressed, Hopeless 3 3 0  PHQ - 2 Score 6 6 0  Altered sleeping 2 3   Tired, decreased energy 2 3   Change in appetite 1 3   Feeling bad or failure about yourself  3 3   Trouble concentrating 2 2   Moving slowly or fidgety/restless 1 3   Suicidal thoughts 1 3   PHQ-9 Score 18 26   Difficult doing work/chores Extremely dIfficult Extremely dIfficult     Flowsheet Row ED from 05/23/2022 in [facility] ED from 05/22/2022 in [place] [facility]PART[facility] ED from 05/16/2022 in [facility]  C-SSRS RISK CATEGORY High Risk High Risk Error: Question 6 not populated       Musculoskeletal  Strength & Muscle Tone: within normal limits Gait & Station: normal Patient leans: N/A  Psychiatric Specialty Exam  Presentation  General Appearance: Casual   Eye Contact:Minimal   Speech:Slow   Speech Volume:Decreased   Handedness:Right    Mood and Affect  Mood:Anxious; Depressed; Labile   Affect:Flat; Depressed    Thought Process  Thought Processes:Coherent; Goal Directed; Linear   Descriptions of Associations:Intact   Orientation:Full (Time, Place and Person)   Thought Content:Logical      Hallucinations:Hallucinations: None   Ideas of Reference:None   Suicidal Thoughts:Suicidal Thoughts: Yes, Passive SI Passive Intent and/or Plan: Without Intent; With Plan; Without Means to Carry Out   Homicidal Thoughts:Homicidal Thoughts: No    Sensorium  Memory:Immediate Good; Recent Good; Remote Good   Judgment:Fair   Insight:Fair    Executive Functions  Concentration:Fair   Attention Span:Fair   Recall:Good   Fund of Knowledge:Good   Language:Good    Psychomotor Activity  Psychomotor Activity:Psychomotor Activity: Decreased    Assets  Assets:Communication  Skills; Desire for Improvement; Financial Resources/Insurance; Intimacy; Leisure Time; Housing; Resilience; Social Support; Talents/Skills; Physical Health; Transportation    Sleep  Sleep:Sleep: Good    No data recorded   Physical Exam  Physical Exam Vitals and nursing note reviewed.  Constitutional:      General: She is not in acute distress.    Appearance: She is well-developed.  HENT:     Head: Normocephalic and atraumatic.  Eyes:     Conjunctiva/sclera: Conjunctivae normal.  Cardiovascular:     Rate and Rhythm: Normal rate and regular rhythm.     Heart sounds: No murmur heard. Pulmonary:     Effort: Pulmonary effort is normal. No respiratory distress.     Breath sounds: Normal breath sounds.  Abdominal:     Palpations: Abdomen is soft.     Tenderness: There is no abdominal tenderness.  Musculoskeletal:        General: No swelling.     Cervical back: Neck supple.  Skin:    General: Skin is warm and dry.     Capillary Refill: Capillary refill takes less than 2 seconds.  Neurological:     Mental Status: She is alert.  Psychiatric:        Mood and Affect: Mood normal.    Review of Systems  Respiratory:  Negative for shortness of breath.   Cardiovascular:  Negative  for chest pain.  Gastrointestinal:  Positive for constipation. Negative for abdominal pain, diarrhea, heartburn, nausea and vomiting.  Neurological:  Negative for headaches.   Blood pressure 100/60, pulse (!) 59, temperature 98 F (36.7 C), temperature source Temporal, resp. rate 18, last menstrual period 05/08/2022, SpO2 99 %. There is no height or weight on file to calculate BMI.  Demographic Factors:  Caucasian and Low socioeconomic status  Loss Factors: Financial problems/change in socioeconomic status  Historical Factors: NA  Risk Reduction Factors:   Responsible for children under 63 years of age, Sense of responsibility to family, Employed, Living with another person, especially a  relative, and Positive social support  Continued Clinical Symptoms:  Bipolar Disorder:   Bipolar II Postpartum Depression More than one psychiatric diagnosis Previous Psychiatric Diagnoses and Treatments  Cognitive Features That Contribute To Risk:  None    Suicide Risk:  Moderate:  Frequent suicidal ideation with limited intensity, and duration, some specificity in terms of plans, no associated intent, good self-control, limited dysphoria/symptomatology, some risk factors present, and identifiable protective factors, including available and accessible social support.  Disposition: Transfer to [facility] for higher level of care  France Ravens, MD 05/26/2022, 1:57 PM

## 2022-05-26 NOTE — ED Notes (Signed)
Pt just got out of show. Pt calm and cooeprative. No c/o pain or distress. Will contine to monitor for safety

## 2022-05-26 NOTE — ED Notes (Signed)
Pt ate dinner in no acute distress. Denies concerns. Currently taking shower. Will continue to monitor for safety.

## 2022-05-26 NOTE — ED Notes (Signed)
Patient A&Ox4. Denies intent to harm self/others when asked. Denies A/VH. Pt approached nurses station c/o increased anxiety upon awakening. Pt states, "I don't know what happened. I woke up and was feeling anxious. Usually, when that happen at home, I just cry. I don't feel like I'm ready to go home. I don't know when I'll be ready, to be honest. I just know I can't go home now. I feel a little better but not better enough to go back home".  Support and encouragement provided. Pt was given Vistaril for increased anxiety. Routine safety checks conducted according to facility protocol. Encouraged patient to notify staff if thoughts of harm toward self or others arise. Patient verbalize understanding and agreement. Will continue to monitor for safety.

## 2022-05-26 NOTE — ED Notes (Signed)
Pt received dinner. 

## 2022-05-26 NOTE — Group Note (Signed)
Group Topic: Change and Accountability  Group Date: 05/26/2022 Start Time: 1100 End Time: 1130 Facilitators: Asher Muir  Department: [facility]  Number of Participants: 4  Group Focus: social skills Treatment Modality:  Solution-Focused Therapy Interventions utilized were exploration Purpose: explore maladaptive thinking All pt were engaged in group session and was attentive in the group discussion pt thanked staff for conducting group. Staff went over MHT material that staff also gave pt to work on their own time as long.  Name: Linda [name] Date of Birth: Jul 03, 1991  MR: [id number]    Level of Participation: active Quality of Participation: attentive Interactions with others: gave feedback Mood/Affect: appropriate Triggers (if applicable): N/A Cognition: goal directed Progress: Moderate Response: PT thanked staff for group  Plan: patient will be encouraged to continue to work on self  Patients Problems:  Patient Active Problem List   Diagnosis Date Noted   Fibromyalgia 05/24/2022   Hashimoto's thyroiditis 05/24/2022   Pure hypercholesterolemia 05/24/2022   Bipolar 2 disorder, major depressive episode (HCC) 05/23/2022   Hidradenitis suppurativa of left axilla 07/10/2020   Hepatomegaly 03/16/2020   Hepatic steatosis 03/16/2020   S/P laparoscopic appendectomy 10/16/2019   Acute appendicitis, uncomplicated    Ovarian cyst, right    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

## 2022-05-26 NOTE — ED Notes (Signed)
Pt currently on phone. No acute distress noted. Safety maintained.

## 2022-05-26 NOTE — ED Notes (Signed)
Pt on phone with husband crying. Informed pt that if phone call is upsetting her, then we ask that she end the call. Pt verbalized, "I'm just telling him about me being transferred to another facility. I'm ok". Overheard pt telling husband that she is scared and that she isn't getting any better. Will continue to monitor for safety and reassure pt of her safety.

## 2022-05-26 NOTE — ED Notes (Signed)
Pt sleeping in no acute distress. RR even and unlabored. Safety maintained. 

## 2022-05-26 NOTE — ED Notes (Signed)
Pt received lunch 

## 2022-05-27 ENCOUNTER — Encounter (HOSPITAL_COMMUNITY): Payer: Self-pay | Admitting: Student

## 2022-05-27 ENCOUNTER — Other Ambulatory Visit: Payer: Self-pay

## 2022-05-27 ENCOUNTER — Ambulatory Visit (HOSPITAL_COMMUNITY): Payer: Medicaid Other

## 2022-05-27 LAB — CBC
HCT: 40.2 % (ref 36.0–46.0)
Hemoglobin: 12.7 g/dL (ref 12.0–15.0)
MCH: 28 pg (ref 26.0–34.0)
MCHC: 31.6 g/dL (ref 30.0–36.0)
MCV: 88.7 fL (ref 80.0–100.0)
Platelets: 280 10*3/uL (ref 150–400)
RBC: 4.53 MIL/uL (ref 3.87–5.11)
RDW: 13.6 % (ref 11.5–15.5)
WBC: 9.8 10*3/uL (ref 4.0–10.5)
nRBC: 0 % (ref 0.0–0.2)

## 2022-05-27 MED ORDER — ONDANSETRON 4 MG PO TBDP
4.0000 mg | ORAL_TABLET | Freq: Once | ORAL | Status: AC
  Administered 2022-05-27: 4 mg via ORAL
  Filled 2022-05-27 (×2): qty 1

## 2022-05-27 MED ORDER — HYDROXYZINE HCL 10 MG PO TABS
10.0000 mg | ORAL_TABLET | Freq: Three times a day (TID) | ORAL | Status: DC | PRN
  Administered 2022-05-28: 10 mg via ORAL
  Filled 2022-05-27 (×3): qty 1

## 2022-05-27 MED ORDER — NICOTINE 7 MG/24HR TD PT24
7.0000 mg | MEDICATED_PATCH | Freq: Every day | TRANSDERMAL | Status: DC
  Administered 2022-05-27 – 2022-05-29 (×3): 7 mg via TRANSDERMAL
  Filled 2022-05-27 (×8): qty 1

## 2022-05-27 MED ORDER — GABAPENTIN 100 MG PO CAPS
100.0000 mg | ORAL_CAPSULE | Freq: Once | ORAL | Status: AC
  Administered 2022-05-27: 100 mg via ORAL
  Filled 2022-05-27 (×2): qty 1

## 2022-05-27 MED ORDER — GABAPENTIN 100 MG PO CAPS
100.0000 mg | ORAL_CAPSULE | Freq: Three times a day (TID) | ORAL | Status: DC
  Administered 2022-05-28 – 2022-05-31 (×11): 100 mg via ORAL
  Filled 2022-05-27 (×17): qty 1

## 2022-05-27 NOTE — Plan of Care (Signed)
  Problem: Education: Goal: Knowledge of [facility] Education information/materials will improve Outcome: Progressing Goal: Emotional status will improve Outcome: Progressing Goal: Mental status will improve Outcome: Progressing Goal: Verbalization of understanding the information provided will improve Outcome: Progressing   Problem: Activity: Goal: Interest or engagement in activities will improve Outcome: Progressing Goal: Sleeping patterns will improve Outcome: Progressing   Problem: Coping: Goal: Ability to verbalize frustrations and anger appropriately will improve Outcome: Progressing Goal: Ability to demonstrate self-control will improve Outcome: Progressing   Problem: Health Behavior/Discharge Planning: Goal: Identification of resources available to assist in meeting health care needs will improve Outcome: Progressing Goal: Compliance with treatment plan for underlying cause of condition will improve Outcome: Progressing   Problem: Physical Regulation: Goal: Ability to maintain clinical measurements within normal limits will improve Outcome: Progressing   Problem: Safety: Goal: Periods of time without injury will increase Outcome: Progressing   Problem: Education: Goal: Ability to make informed decisions regarding treatment will improve Outcome: Progressing   Problem: Coping: Goal: Coping ability will improve Outcome: Progressing   Problem: Health Behavior/Discharge Planning: Goal: Identification of resources available to assist in meeting health care needs will improve Outcome: Progressing   Problem: Medication: Goal: Compliance with prescribed medication regimen will improve Outcome: Progressing   Problem: Self-Concept: Goal: Ability to disclose and discuss suicidal ideas will improve Outcome: Progressing Goal: Will verbalize positive feelings about self Outcome: Progressing   Problem: Education: Goal: Utilization of techniques to improve  thought processes will improve Outcome: Progressing Goal: Knowledge of the prescribed therapeutic regimen will improve Outcome: Progressing   Problem: Activity: Goal: Interest or engagement in leisure activities will improve Outcome: Progressing Goal: Imbalance in normal sleep/wake cycle will improve Outcome: Progressing   Problem: Coping: Goal: Coping ability will improve Outcome: Progressing Goal: Will verbalize feelings Outcome: Progressing   Problem: Health Behavior/Discharge Planning: Goal: Ability to make decisions will improve Outcome: Progressing Goal: Compliance with therapeutic regimen will improve Outcome: Progressing   Problem: Role Relationship: Goal: Will demonstrate positive changes in social behaviors and relationships Outcome: Progressing   Problem: Safety: Goal: Ability to disclose and discuss suicidal ideas will improve Outcome: Progressing Goal: Ability to identify and utilize support systems that promote safety will improve Outcome: Progressing   Problem: Self-Concept: Goal: Will verbalize positive feelings about self Outcome: Progressing Goal: Level of anxiety will decrease Outcome: Progressing   Problem: Education: Goal: Ability to state activities that reduce stress will improve Outcome: Progressing   Problem: Coping: Goal: Ability to identify and develop effective coping behavior will improve Outcome: Progressing   Problem: Self-Concept: Goal: Ability to identify factors that promote anxiety will improve Outcome: Progressing Goal: Level of anxiety will decrease Outcome: Progressing Goal: Ability to modify response to factors that promote anxiety will improve Outcome: Progressing   

## 2022-05-27 NOTE — Progress Notes (Signed)
[facility] Group Notes:  (Nursing/MHT/Case Management/Adjunct)  Date:  05/27/2022  Time: 2000 Type of Therapy:   wrap up group  Participation Level:  Active  Participation Quality:  Appropriate, Attentive, Sharing, and Supportive  Affect:  Anxious, Depressed, and Tearful  Cognitive:  Alert  Insight:  Improving  Engagement in Group:  Engaged  Modes of Intervention:  Clarification, Education, and Support  Summary of Progress/Problems: Positive thinking and positive change were discussed.   Marcille Buffy 05/27/2022, 9:44 PM

## 2022-05-27 NOTE — Progress Notes (Signed)
Pt is reporting tingling on the left side of her face and radiating down her left arm. Pt states she is having some numbness as well. Pt is in no acute distress. Pt denies any recent tick bites and states the numbness has been present for about an hour and a half at the time of this note. Pt remains safe on Q15 min checks and contracts for safety.

## 2022-05-27 NOTE — Progress Notes (Signed)
Patient appears anxious and worried. Patient denies SI/HI/AVH. Patient complied with morning medication with no reported side effects. Pt has expressed interest in singing a 72 hour, but has not signed one yet. Pt is minimal. Patient remains safe on Q5min checks and contracts for safety.      05/27/22 1038  Psych Admission Type (Psych Patients Only)  Admission Status Voluntary  Psychosocial Assessment  Patient Complaints Anxiety;Worrying;Depression  Eye Contact Fair  Facial Expression Flat;Sullen;Sad  Affect Anxious  Speech Soft;Slow  Interaction Minimal  Motor Activity Slow  Appearance/Hygiene Unremarkable  Behavior Characteristics Appropriate to situation;Cooperative  Mood Depressed;Anxious  Thought Process  Coherency WDL  Content WDL  Delusions None reported or observed  Perception WDL  Hallucination None reported or observed  Judgment Limited  Confusion None  Danger to Self  Current suicidal ideation? Denies  Self-Injurious Behavior No self-injurious ideation or behavior indicators observed or expressed   Agreement Not to Harm Self Yes  Description of Agreement verbal  Danger to Others  Danger to Others None reported or observed  Danger to Others Abnormal  Harmful Behavior to others No threats or harm toward other people  Destructive Behavior No threats or harm toward property

## 2022-05-27 NOTE — H&P (Addendum)
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  Place an order for no  roommates to help with patient's anxiety.  She denies passive or active SI intention or plan at this time and reports passive SI last Saturday and Sunday when presented to urgent care with no active intention or plan back then or currently.  Patient reports feeling comfortable in groups, she was encouraged to participate.  Discussed with patient medications changes as noted, will follow.   Korey Prashad Abbott Pao, MD Psychiatrist
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
## 2022-05-27 NOTE — Progress Notes (Addendum)
Pt is a 31 y.o. Caucasian female presenting voluntarily to [facility] from [facility] for SI with a plan to overdose on medications and increased panic attacks daily despite medication changes. Pt has a PMHx of GAD, ADD, postpartum depression, fibromyalgia, GERD, Hashimoto's thyroiditis, and IBS. Pt reports that her husband took her to [facility] on Saturday because she had expressed suicidal thoughts. Pt also reports feeling depressed and being "panicky" a week before that Saturday. She said that she has been seeing Dr. Lind Guest (psychiatrist) who had discontinued her lexapro and started her on some new mood stabilizers. She reports taking Cymbalta 30 mg po once daily, Neurontin 200 mg po tid, Seroquel 300 mg po at bedtime, a multivitamin daily, Claritin for her allergies, and nystatin powder for her bilateral rash under her breasts. Pt said that she had been taking gabitril and trileptal for a few days, but feels like it has not improved her anxiety but worsened it. Pt reports that these panic attacks occur suddenly and she experiences heat that comes up her back/neck, chest tightness, dizziness, and palpitations. Pt also reports that she lost her job from her anxiety. Pt denies previous SA's or engaging in any self-injurious behaviors. Denies any prior psych hospitalizations. Pt reports living with her husband and 31 y.o. son.   She smokes 1 pack/day of cigarettes. Last time she smoked was on Saturday. Pt said that she takes 1 mg of Xanax PRN and she was prescribed that 3-4 years ago. She last reports taking Xanax last Thursday. Pt was also positive for benzos on 05/22/2022.   Pt denies SI/HI and AVH upon admission. Pt verbally contracts for safety. Agrees to notify staff immediately for any thoughts of hurting herself or anyone else. Unit tour provided. Unit handbook discussed and opportunity to provide questions provided. Food/fluids offered and accepted. Toiletries provided. Active listening, reassurance, and support  provided. Q 15 min safety checks continue. Pt's safety has been maintained.

## 2022-05-27 NOTE — Tx Team (Signed)
Initial Treatment Plan 05/27/2022 2:16 AM Linda [name] UUE:[id number]    PATIENT STRESSORS: Financial difficulties   Medication change or noncompliance     PATIENT STRENGTHS: Capable of independent living  Forensic psychologist fund of knowledge  Motivation for treatment/growth  Supportive family/friends    PATIENT IDENTIFIED PROBLEMS: SI with a plan to overdose on medications  Increased panic attacks every day  depression  Medication change to Cymbalta, gabapentin, and Seroquel due to increased anxiety  Lost her job             DISCHARGE CRITERIA:  Improved stabilization in mood, thinking, and/or behavior Motivation to continue treatment in a less acute level of care Need for constant or close observation no longer present Reduction of life-threatening or endangering symptoms to within safe limits Safe-care adequate arrangements made Verbal commitment to aftercare and medication compliance  PRELIMINARY DISCHARGE PLAN: Outpatient therapy Return to previous living arrangement Return to previous work or school arrangements  PATIENT/FAMILY INVOLVEMENT: This treatment plan has been presented to and reviewed with the patient, Linda [name], and/or family member.  The patient and family have been given the opportunity to ask questions and make suggestions.  Ephraim Hamburger, RN 05/27/2022, 2:16 AM

## 2022-05-27 NOTE — Progress Notes (Signed)
Pt complains of dizziness BP is 112/71 HR 63 at 1522. Pt had a low BP when standing this morning at 1145 96/37 but it went it up to 126/92 when sitting. Fluids were pushed and are continuing to be pushed. Pt remains safe on Q15 min and contracts for safety. MD notified.

## 2022-05-28 ENCOUNTER — Encounter (HOSPITAL_COMMUNITY): Payer: Self-pay

## 2022-05-28 MED ORDER — QUETIAPINE FUMARATE 400 MG PO TABS
400.0000 mg | ORAL_TABLET | Freq: Every day | ORAL | Status: DC
  Administered 2022-05-28 – 2022-05-30 (×3): 400 mg via ORAL
  Filled 2022-05-28 (×5): qty 1

## 2022-05-28 MED ORDER — QUETIAPINE FUMARATE 25 MG PO TABS
25.0000 mg | ORAL_TABLET | Freq: Two times a day (BID) | ORAL | Status: DC
  Administered 2022-05-28 – 2022-05-29 (×3): 25 mg via ORAL
  Filled 2022-05-28 (×7): qty 1

## 2022-05-28 MED ORDER — LEVOTHYROXINE SODIUM 150 MCG PO TABS
150.0000 ug | ORAL_TABLET | Freq: Every day | ORAL | Status: DC
  Administered 2022-05-29 – 2022-05-31 (×3): 150 ug via ORAL
  Filled 2022-05-28 (×5): qty 1

## 2022-05-28 MED ORDER — DULOXETINE HCL 30 MG PO CPEP
30.0000 mg | ORAL_CAPSULE | Freq: Two times a day (BID) | ORAL | Status: DC
Start: 2022-05-28 — End: 2022-05-31
  Administered 2022-05-28 – 2022-05-31 (×6): 30 mg via ORAL
  Filled 2022-05-28 (×10): qty 1

## 2022-05-28 NOTE — Plan of Care (Signed)
  Problem: Coping: Goal: Ability to verbalize frustrations and anger appropriately will improve Outcome: Progressing   Problem: Coping: Goal: Ability to demonstrate self-control will improve Outcome: Progressing   Problem: Safety: Goal: Periods of time without injury will increase Outcome: Progressing   Problem: Medication: Goal: Compliance with prescribed medication regimen will improve Outcome: Progressing

## 2022-05-28 NOTE — BH IP Treatment Plan (Signed)
Interdisciplinary Treatment and Diagnostic Plan Update  05/28/2022 Time of Session: 1000 Linda [name] MRN: [id number]  Principal Diagnosis: Bipolar 2 disorder, major depressive episode (HCC)  Secondary Diagnoses: Principal Problem:   Bipolar 2 disorder, major depressive episode (HCC) Active Problems:   Chronic anxiety   Fibromyalgia   Hashimoto's thyroiditis   Pure hypercholesterolemia   Current Medications:  Current Facility-Administered Medications  Medication Dose Route Frequency Provider Last Rate Last Admin   acetaminophen (TYLENOL) tablet 650 mg  650 mg Oral Q6H PRN Park Pope, MD       albuterol (VENTOLIN HFA) 108 (90 Base) MCG/ACT inhaler 1-2 puff  1-2 puff Inhalation Q6H PRN Park Pope, MD       DULoxetine (CYMBALTA) DR capsule 30 mg  30 mg Oral BID Abbott Pao, Nadir, MD       gabapentin (NEURONTIN) capsule 100 mg  100 mg Oral TID Abbott Pao, Nadir, MD   100 mg at 05/28/22 1402   hydrOXYzine (ATARAX) tablet 10 mg  10 mg Oral TID PRN Sarita Bottom, MD   10 mg at 05/28/22 1142   [START ON 05/29/2022] levothyroxine (SYNTHROID) tablet 150 mcg  150 mcg Oral Q0600 Sarita Bottom, MD       loratadine (CLARITIN) tablet 10 mg  10 mg Oral Daily Park Pope, MD   10 mg at 05/28/22 1025   nicotine (NICODERM CQ - dosed in mg/24 hr) patch 7 mg  7 mg Transdermal Daily Abbott Pao, Nadir, MD   7 mg at 05/28/22 0810   nystatin (MYCOSTATIN/NYSTOP) topical powder   Topical BID Park Pope, MD   Given at 05/27/22 1643   pantoprazole (PROTONIX) EC tablet 80 mg  80 mg Oral Daily Park Pope, MD   80 mg at 05/28/22 1025   polyethylene glycol (MIRALAX / GLYCOLAX) packet 17 g  17 g Oral BID Park Pope, MD       QUEtiapine (SEROQUEL) tablet 25 mg  25 mg Oral BID Abbott Pao, Nadir, MD   25 mg at 05/28/22 1400   QUEtiapine (SEROQUEL) tablet 400 mg  400 mg Oral QHS Sarita Bottom, MD       senna-docusate (Senokot-S) tablet 1 tablet  1 tablet Oral BID Park Pope, MD       simethicone [facility]) chewable tablet 80 mg  80 mg  Oral Q6H PRN Park Pope, MD       PTA Medications: Medications Prior to Admission  Medication Sig Dispense Refill Last Dose   albuterol (VENTOLIN HFA) 108 (90 Base) MCG/ACT inhaler Inhale 1-2 puffs into the lungs every 6 (six) hours as needed for wheezing or shortness of breath. 18 g 0    ALPRAZolam (XANAX) 1 MG tablet Take 1 mg by mouth 2 (two) times daily as needed for anxiety.      DULoxetine (CYMBALTA) 30 MG capsule Take 30 mg by mouth every morning.      GABITRIL 4 MG tablet Take 4 mg by mouth 2 (two) times daily.      levothyroxine (SYNTHROID) 175 MCG tablet Take 175 mcg by mouth daily.      Multiple Vitamin (MULTIVITAMIN WITH MINERALS) TABS tablet Take 1 tablet by mouth daily.      omeprazole (PRILOSEC) 40 MG capsule Take 40 mg by mouth daily.      Oxcarbazepine (TRILEPTAL) 300 MG tablet Take 300 mg by mouth 2 (two) times daily as needed (anxiety).       Patient Stressors: Financial difficulties   Medication change or noncompliance    Patient Strengths: Capable  of independent living  Communication skills  General fund of knowledge  Motivation for treatment/growth  Supportive family/friends   Treatment Modalities: Medication Management, Group therapy, Case management,  1 to 1 session with clinician, Psychoeducation, Recreational therapy.   Physician Treatment Plan for Primary Diagnosis: Bipolar 2 disorder, major depressive episode ([place]) Long Term Goal(s): Improvement in symptoms so as ready for discharge   Short Term Goals: Ability to disclose and discuss suicidal ideas  Medication Management: Evaluate patient's response, side effects, and tolerance of medication regimen.  Therapeutic Interventions: 1 to 1 sessions, Unit Group sessions and Medication administration.  Evaluation of Outcomes: Progressing  Physician Treatment Plan for Secondary Diagnosis: Principal Problem:   Bipolar 2 disorder, major depressive episode ([place]) Active Problems:   Chronic anxiety    Fibromyalgia   Hashimoto's thyroiditis   Pure hypercholesterolemia  Long Term Goal(s): Improvement in symptoms so as ready for discharge   Short Term Goals: Ability to disclose and discuss suicidal ideas     Medication Management: Evaluate patient's response, side effects, and tolerance of medication regimen.  Therapeutic Interventions: 1 to 1 sessions, Unit Group sessions and Medication administration.  Evaluation of Outcomes: Progressing   RN Treatment Plan for Primary Diagnosis: Bipolar 2 disorder, major depressive episode ([place]) Long Term Goal(s): Knowledge of disease and therapeutic regimen to maintain health will improve  Short Term Goals: Ability to remain free from injury will improve, Ability to verbalize frustration and anger appropriately will improve, Ability to demonstrate self-control, Ability to participate in decision making will improve, Ability to verbalize feelings will improve, Ability to disclose and discuss suicidal ideas, Ability to identify and develop effective coping behaviors will improve, and Compliance with prescribed medications will improve  Medication Management: RN will administer medications as ordered by provider, will assess and evaluate patient's response and provide education to patient for prescribed medication. RN will report any adverse and/or side effects to prescribing provider.  Therapeutic Interventions: 1 on 1 counseling sessions, Psychoeducation, Medication administration, Evaluate responses to treatment, Monitor vital signs and CBGs as ordered, Perform/monitor CIWA, COWS, AIMS and Fall Risk screenings as ordered, Perform wound care treatments as ordered.  Evaluation of Outcomes: Progressing   LCSW Treatment Plan for Primary Diagnosis: Bipolar 2 disorder, major depressive episode ([place]) Long Term Goal(s): Safe transition to appropriate next level of care at discharge, Engage patient in therapeutic group addressing interpersonal  concerns.  Short Term Goals: Engage patient in aftercare planning with referrals and resources, Increase social support, Increase ability to appropriately verbalize feelings, Increase emotional regulation, Facilitate acceptance of mental health diagnosis and concerns, Facilitate patient progression through stages of change regarding substance use diagnoses and concerns, Identify triggers associated with mental health/substance abuse issues, and Increase skills for wellness and recovery  Therapeutic Interventions: Assess for all discharge needs, 1 to 1 time with Social worker, Explore available resources and support systems, Assess for adequacy in community support network, Educate family and significant other(s) on suicide prevention, Complete Psychosocial Assessment, Interpersonal group therapy.  Evaluation of Outcomes: Progressing   Progress in Treatment: Attending groups: Yes. Participating in groups: Yes. Taking medication as prescribed: Yes. Toleration medication: Yes. Family/Significant other contact made: No, will contact:  CSW will reach out to collateral once patient provides consent.  Patient understands diagnosis: Yes. Discussing patient identified problems/goals with staff: Yes. Medical problems stabilized or resolved: Yes. Denies suicidal/homicidal ideation: No. Issues/concerns per patient self-inventory: Yes. Other: none  New problem(s) identified: No, Describe:  None   New Short Term/Long Term Goal(s): Patient  to work towards medication management for mood stabilization; elimination of SI thoughts; development of comprehensive mental wellness plan.  Patient Goals:  Patient states she would like to "not be so anxious and sad."   Discharge Plan or Barriers: No psychosocial barriers identified at this time, patient to return to place of residence when appropriate for discharge.   Reason for Continuation of Hospitalization: Anxiety Depression  Estimated Length of Stay:1-7  days   Last 3 [place] Suicide Severity Risk Score: Flowsheet Row [facility] (Current[facility]23[facility]2022 in [facility] ED from 05/22/2022 in [facility]PARTMENT  C-SSRS RISK CATEGORY High Risk High Risk High Risk       Last PHQ 2/9 Scores:    05/26/2022   11:38 AM 05/26/2022   11:15 AM 05/24/2022    8:08 AM  Depression screen PHQ 2/9  Decreased Interest 3 3 3   Down, Depressed, Hopeless  3 3  PHQ - 2 Score 3 6 6   Altered sleeping 1 2 3   Tired, decreased energy 2 2 3   Change in appetite 2 1 3   Feeling bad or failure about yourself  3 3 3   Trouble concentrating 1 2 2   Moving slowly or fidgety/restless 1 1 3   Suicidal thoughts 2 1 3   PHQ-9 Score 15 18 26   Difficult doing work/chores Not difficult at all Extremely dIfficult Extremely dIfficult    Scribe for Treatment Team: 05/28/2022 3:29 PM

## 2022-05-28 NOTE — Group Note (Signed)
LCSW Group Therapy Note  Group Date: 05/28/2022 Start Time: 1300 End Time: 1400   Type of Therapy and Topic:  Group Therapy - Healthy vs Unhealthy Coping Skills  Participation Level:  Active   Description of Group The focus of this group was to determine what unhealthy coping techniques typically are used by group members and what healthy coping techniques would be helpful in coping with various problems. Patients were guided in becoming aware of the differences between healthy and unhealthy coping techniques. Patients were asked to identify 2-3 healthy coping skills they would like to learn to use more effectively.  Therapeutic Goals Patients learned that coping is what human beings do all day long to deal with various situations in their lives Patients defined and discussed healthy vs unhealthy coping techniques Patients identified their preferred coping techniques and identified whether these were healthy or unhealthy Patients determined 2-3 healthy coping skills they would like to become more familiar with and use more often. Patients provided support and ideas to each other   Summary of Patient Progress:  During group, pt expressed smoking a cigarette is a coping that works for him. Patient proved open to input from peers and feedback from CSW. Patient demonstrated good insight into the subject matter, was respectful of peers, and participated throughout the entire session.   Therapeutic Modalities Cognitive Behavioral Therapy Motivational Interviewing  Kathrynn Humble 05/28/2022  2:20 PM
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
## 2022-05-28 NOTE — Progress Notes (Signed)
   05/28/22 2228  Psych Admission Type (Psych Patients Only)  Admission Status Voluntary  Psychosocial Assessment  Patient Complaints None  Eye Contact Fair  Facial Expression Anxious  Affect Appropriate to circumstance  Speech Logical/coherent  Interaction Minimal  Motor Activity Other (Comment) (WDL)  Appearance/Hygiene Unremarkable  Behavior Characteristics Appropriate to situation  Mood Depressed  Thought Process  Coherency WDL  Content WDL  Delusions None reported or observed  Perception WDL  Hallucination None reported or observed  Judgment Limited  Confusion None  Danger to Self  Current suicidal ideation? Denies  Self-Injurious Behavior No self-injurious ideation or behavior indicators observed or expressed   Agreement Not to Harm Self Yes  Description of Agreement verbal  Danger to Others  Danger to Others None reported or observed  Danger to Others Abnormal  Harmful Behavior to others No threats or harm toward other people  Destructive Behavior No threats or harm toward property

## 2022-05-28 NOTE — [facility] Suicide Risk Assessment (Signed)
[facility] INPATIENT:  Family/Significant Other Suicide Prevention Education  Suicide Prevention Education:  Education Completed; Cannon Kettle [phone] (Husband) has been identified by the patient as the family member/significant other with whom the patient will be residing, and identified as the person(s) who will aid the patient in the event of a mental health crisis (suicidal ideations/suicide attempt).  With written consent from the patient, the family member/significant other has been provided the following suicide prevention education, prior to the and/or following the discharge of the patient.  The suicide prevention education provided includes the following: Suicide risk factors Suicide prevention and interventions National Suicide Hotline telephone number [facility] assessment telephone number [facility] Emergency Assistance 911 [facility] and/or Residential Mobile Crisis Unit telephone number  Request made of family/significant other to: Remove weapons (e.g., guns, rifles, knives), all items previously/currently identified as safety concern.   Remove drugs/medications (over-the-counter, prescriptions, illicit drugs), all items previously/currently identified as a safety concern.  The family member/significant other verbalizes understanding of the suicide prevention education information provided.  The family member/significant other agrees to remove the items of safety concern listed above.  CSW spoke with Mr. Aurea Graff who states that his wife has been experiencing depression, anxiety, up and down moods, and suicidal thoughts.  He states that his wife has been experiencing anxiety and panic attacks for the 8 years that he has known her.  He confirms that his wife can return home after discharge and that all firearms in the home are locked in a safe.  He states that his wife does not have access to the gun safe.  CSW completed SPE with Mr. Aurea Graff.   Metro Kung  Marnesha Gagen 05/28/2022, 3:59 PM

## 2022-05-28 NOTE — Progress Notes (Signed)
[facility] MD Progress Note  05/28/2022 11:27 AM Linda [name]  MRN:  [id number]   Reason for Admission:  Linda [name] is a 31 y.o., female with a past psychiatric history significant for GAD, MDD, PTSD, and pt/mom report of bipolar (NOS), PMH of Fibromyalgia, Hypothyroidism, IBS, and GERD, and PSH of emergency C-Section for placental abruption (03/20/13) who presents to the [facility] from [facility] for evaluation of suicidal ideation and stabilization of psychiatric medications. Hospital Day: 2.   Chart Review from last 24 hours:  The patient's chart was reviewed and nursing notes were reviewed. The patient's case was discussed in multidisciplinary team meeting. Per MAR, her vitals have been within normal limits, save a brief episode of tachycardia upon standing up this morning. She has taken her scheduled medications save her bowel regiment. PRN Hydroxyzine 25 mg yesterday followed closely by significant orthostatic hypotension episode. Patient attending groups. No pending work-up.  Patient also had episode of left face and left arm numbness and tingling that resolved by itself  Information Obtained Today During Patient Interview: The patient was seen and evaluated on the unit. On assessment today the patient reports she is anxious and sad, feeling hopeless and helpless. She would not want to continue living if this is her baseline. Last night her sleep was better than the night before, but was frequently interrupted and much shorter than needed and desired. Her appetite is poor. Throughout the interview, her mood is labile, becoming tearful easily. Patient feels unsafe on unit, and is worried both about staying or leaving. When we share with her that we have concerns about possible side effects from her medications, given her multiple recent failed trials in medication changes in the outpatient setting, she voices agreement to staying for a few more days.  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 Mariane Masters (requesting new psychiatrist following PHP) Prior Outpatient Therapy: none (was followed 14-18 by [facility] per chart)  Past Medical History:  Past Medical History:  Diagnosis Date   ADD (attention deficit disorder)    Asthma    Fibromyalgia    GAD (generalized anxiety disorder)    GERD (gastroesophageal reflux disease)    Hashimoto's thyroiditis    Heart murmur    IBS (irritable bowel syndrome)    Interstitial cystitis    postpartum depression     Past Surgical History:  Procedure Laterality Date   APPENDECTOMY     CESAREAN SECTION N/A 03/20/2013   Procedure: CESAREAN SECTION;  Surgeon: [name], MD;  Location: [facility];  Service: Obstetrics;  Laterality: N/A;   CESAREAN SECTION     gastric     gasric bypass   LAPAROSCOPIC APPENDECTOMY N/A 10/16/2019   Procedure: APPENDECTOMY LAPAROSCOPIC;  Surgeon: Franky Macho, MD;  Location: [facility];  Service:  General;  Laterality: N/A;   MYRINGOTOMY WITH TUBE PLACEMENT     NO PAST SURGERIES     TYMPANOSTOMY TUBE PLACEMENT     UPPER GI ENDOSCOPY     Family History:  Family History  Problem  Relation Age of Onset   Cancer Paternal Uncle    COPD Maternal Grandfather    Stroke Paternal Grandfather    Depression Mother    OCD Mother    Anxiety disorder Mother    Rheum arthritis Mother    Fibromyalgia Mother    Drug abuse Maternal Uncle    Depression Maternal Uncle    Anxiety disorder Maternal Uncle    Depression Maternal Grandmother    Anxiety disorder Brother    Healthy Brother    Dementia Paternal Grandmother    Autoimmune disease Father    ADD / ADHD Son    Healthy Son    Family Psychiatric  History: Psychiatric illness: maternal aunt suspected with bipolar Suicide: no Substance Abuse: no  Social History:  Social History        Substance and Sexual Activity  Alcohol Use Not Currently    Comment: occasionally     Social History        Substance and Sexual Activity  Drug Use Yes   Types: Benzodiazepines    Comment: 1 mg of Xanax PRN, was prescribed 3-4 years ago, last used on Thursday    Living situation: lives at home Social support: with husband and supportive mother Marital Status: second marriage, dating for 8 years, married for 2 Children: 53 57-year-old boy Education: high school, trade school Employment: [employer] stream[employer] service: no Legal history: no Trauma: physical and emotional Access to guns: yes  Sleep:  Poor  Appetite:  Poor  Current Medications: Current Facility-Administered Medications  Medication Dose Route Frequency Provider Last Rate Last Admin   acetaminophen (TYLENOL) tablet 650 mg  650 mg Oral Q6H PRN Park Pope, MD       albuterol (VENTOLIN HFA) 108 (90 Base) MCG/ACT inhaler 1-2 puff  1-2 puff Inhalation Q6H PRN Park Pope, MD       DULoxetine (CYMBALTA) DR capsule 30 mg  30 mg Oral q morning Park Pope, MD   30 mg at 05/28/22 1025   gabapentin (NEURONTIN) capsule 100 mg  100 mg Oral TID Abbott Pao, Nadir, MD   100 mg at 05/28/22 0809   hydrOXYzine (ATARAX) tablet 10 mg  10 mg Oral TID PRN Sarita Bottom, MD        levothyroxine (SYNTHROID) tablet 175 mcg  175 mcg Oral Q0600 Park Pope, MD   175 mcg at 05/28/22 0630   loratadine (CLARITIN) tablet 10 mg  10 mg Oral Daily Park Pope, MD   10 mg at 05/28/22 1025   nicotine (NICODERM CQ - dosed in mg/24 hr) patch 7 mg  7 mg Transdermal Daily Abbott Pao, Nadir, MD   7 mg at 05/28/22 0810   nystatin (MYCOSTATIN/NYSTOP) topical powder   Topical BID Park Pope, MD   Given at 05/27/22 1643   pantoprazole (PROTONIX) EC tablet 80 mg  80 mg Oral Daily Park Pope, MD   80 mg at 05/28/22 1025   polyethylene glycol (MIRALAX / GLYCOLAX) packet 17 g  17 g Oral BID Park Pope, MD       QUEtiapine (SEROQUEL) tablet 300 mg  300 mg Oral QHS Park Pope, MD   300 mg at 05/27/22 2124   senna-docusate (Senokot-S) tablet 1 tablet  1 tablet Oral BID Park Pope,  MD       simethicone (MYLICON) chewable tablet 80 mg  80 mg Oral Q6H PRN France Ravens, MD        Lab Results:  Results for orders placed or performed during the hospital encounter of 05/26/22 (from the past 48 hour(s))  CBC     Status: None   Collection Time: 05/27/22  6:28 AM  Result Value Ref Range   WBC 9.8 4.0 - 10.5 K/uL   RBC 4.53 3.87 - 5.11 MIL/uL   Hemoglobin 12.7 12.0 - 15.0 g/dL   HCT 40.2 36.0 - 46.0 %   MCV 88.7 80.0 - 100.0 fL   MCH 28.0 26.0 - 34.0 pg   MCHC 31.6 30.0 - 36.0 g/dL   RDW 13.6 11.5 - 15.5 %   Platelets 280 150 - 400 K/uL   nRBC 0.0 0.0 - 0.2 %    Comment: Performed at [facility], [place] [name] [place], [place] [address]    Blood Alcohol level:  Lab Results  Component Value Date   ETH <10 05/22/2022   ETH <10 0000000    Metabolic Disorder Labs: Lab Results  Component Value Date   HGBA1C 5.4 06/05/2020   No results found for: "PROLACTIN" Lab Results  Component Value Date   CHOL 166 05/23/2022   TRIG 67 05/23/2022   HDL 32 (L) 05/23/2022   CHOLHDL 5.2 05/23/2022   VLDL 13 05/23/2022   LDLCALC 121 (H) 05/23/2022    Physical Findings: AIMS: Facial and  Oral Movements Muscles of Facial Expression: None, normal Lips and Perioral Area: None, normal Jaw: None, normal Tongue: None, normal,Extremity Movements Upper (arms, wrists, hands, fingers): None, normal Lower (legs, knees, ankles, toes): None, normal, Trunk Movements Neck, shoulders, hips: None, normal, Overall Severity Severity of abnormal movements (highest score from questions above): None, normal Incapacitation due to abnormal movements: None, normal Patient's awareness of abnormal movements (rate only patient's report): No Awareness, Dental Status Current problems with teeth and/or dentures?: No Does patient usually wear dentures?: No  CIWA:  CIWA-Ar Total: 2 COWS:  COWS Total Score: 1  Musculoskeletal: Strength & Muscle Tone: within normal limits Gait & Station: normal Patient leans: N/A  Psychiatric Specialty Exam:  General Appearance: young female appears at stated age, fairly dressed and groomed, laying in bed  Behavior: cooperative, does not sit up throughout interview  Psychomotor Activity: psychomotor retardation Eye Contact: fair Speech: normal amount, decreased tone, volume and latency   Mood: dysphoric Affect: congruent, sad  Thought Process: linear, goal directed Descriptions of Associations: intact Thought Content: ruminating on safety, mood Hallucinations: no AH, VH  Delusions: no Paranoia  Suicidal Thoughts: no SI, intention, plan  Homicidal Thoughts: no HI, intention, plan   Alertness/Orientation: intact to interview  Insight: fair Judgment: improving  Memory: intact  Executive Functions  Concentration: intact Attention Span: Fair Recall: intact Fund of Knowledge: fair   Assets  Assets:Communication Skills; Desire for Improvement; Financial Resources/Insurance; Intimacy; Leisure Time; Housing; Resilience; Social Support; Talents/Skills; Physical Health; Transportation    Physical Exam: Physical Exam Vitals and nursing note  reviewed.  Constitutional:      Appearance: Normal appearance.  HENT:     Head: Normocephalic.  Cardiovascular:     Rate and Rhythm: Tachycardia present.  Musculoskeletal:     Cervical back: Normal range of motion and neck supple.  Neurological:     General: No focal deficit present.     Mental Status: She is alert and oriented to person, place,  and time.    Review of Systems  Constitutional:  Negative for fever.  Respiratory:  Negative for cough.   Cardiovascular:  Negative for chest pain and palpitations.  Neurological:  Negative for dizziness, tingling, weakness and headaches.   Blood pressure 106/80, pulse (!) 110, temperature 97.7 F (36.5 C), temperature source Oral, resp. rate 16, height 5\' 5"  (1.651 m), weight 92.6 kg, last menstrual period 05/08/2022, SpO2 99 %. Body mass index is 33.98 kg/m.   Treatment Plan Summary: Daily contact with patient to assess and evaluate symptoms and progress in treatment and Medication management  ASSESSMENT:  Diagnoses / Active Problems: Principal Problem: Bipolar 2 disorder, major depressive episode (HCC) Diagnosis: Principal Problem:   Bipolar 2 disorder, major depressive episode (HCC) Active Problems:   Chronic anxiety   Fibromyalgia   Hashimoto's thyroiditis   Pure hypercholesterolemia  Linda [name] is a 31 y.o., female with a past psychiatric history significant for insomnia, GAD, MDD, PTSD, and pt/mom report of bipolar (NOS), PMH of Fibromyalgia, Hypothyroidism, IBS, and GERD, and PSH of emergency C-Section for placental abruption (03/20/13) who presents to the [facility] from [facility] for evaluation and management of suicidal ideation.   Initial Assessment: Pt appears significantly anxious demonstrating signs of hypervigilance and avoidant behavior about risk to safety on unit, supportive of prior GAD & PTSD diagnoses. She clearly states that she had passive SI in the setting of medication changes now improved,  and that she had no intention or plan to hurt herself and has no history of doing so before; the story is confirmed by collateral from her mother and husband. She does appear to have severe, post-partum MDD w/out psychotic in partial remission for a while now. There is ongoing concern that she may be on the bipolar spectrum, with one-two month long periods of elevated mood with indiscrete, goal-directed activity (excessive spending, adopting pets, big life decisions) she later regrets, decreased need for sleep (3-4 hours), and pressured speech. The ramifications of the decisions seem to be moderate, with her mom concerned about the effects on her life, but seemingly no impairment in daily functioning raising concern to the level of hospitalization. Thus, she currently meets criteria for Bipolar 2 Disorder, though a more detailed history is warranted to explore her symptoms.   The patient is deemed to be acutely psychiatrically stable with no risk to herself or others. Her multiple mental illnesses warrant intensive outpatient therapy and psychiatric treatment.  PLAN: Safety and Monitoring:  -- Voluntary admission to inpatient psychiatric unit for safety, stabilization and treatment             -- Daily contact with patient to assess and evaluate symptoms and progress in treatment             -- Patient's case to be discussed in multi-disciplinary team meeting             -- Observation Level : q15 minute checks             -- Vital signs:  q12 hours             -- Precautions: suicide, elopement, and assault             -- patient with no roommate order to prevent PTSD triggering   2. Medications:  #Severe MDD with post-partum onset, without psychotic features in partial remission #Bipolar 2 Disorder (meets criteria per above) #GAD #PTSD #Hypothyroidism #Insomnia             --  Duloxetine 30 mg PO, twice daily for depression, anxiety, and PTSD, also will help with pain sec to fibromyalgia.              -- Increasing nighttime Quetiapine to 400 mg qhs for mood, anxiety, sleep  -- 25 mg Quetiapine daily at 8 AM & 2 PM for anxiety             -- Levothyroxine 175 mcg daily for hypothyroidism/home meds  -- Gabapentin 100 mg TID for anxiety and mood, also will help with chronic pain             -- Loratadine 10 mg daily for seasonal allergy/home meds             -- patient declining miralax & senokot             PRNs:             -- Hydroxyzine 10 mg  TID prn for anxiety             -- Tylenol, Albuterol, Simethicone   --  The risks/benefits/side-effects/alternatives to this medication were discussed in detail with the patient and time was given for questions. The patient consents to medication trial.              -- Metabolic profile and EKG monitoring obtained while on an atypical antipsychotic (BMI: Lipid Panel: HbgA1c: QTc:) HDL: 32, LDL: 121   ##Intertrigo             -- Nystatin topical powder 2 times daily   ##GERD             -- Continue Pantoprazole 80 mg PO daily   ##Dyslipidemia (HDL 32; LDL: 121)             -- F/u with outpatient provider                3. Labs Reviewed: CBC, CMP, Lipid Profile, Thyroid Profile, Utox, Blood Tox, Pregnancy Test, and Infectious Disease testing without significant concern        Lab ordered: none   4. Tobacco Use Disorder             -- Nicotine patch 7 mg/24 hours ordered             -- Smoking cessation encouraged   5. Group and Therapy: -- Encouraged patient to participate in unit milieu and in scheduled group therapies              --Substance Use counseling: not indicated   6. Discharge Planning:              -- Social work and case management to assist with discharge planning and identification of hospital follow-up needs prior to discharge             -- Estimated LOS: 4 days             -- Discharge Concerns: Need to establish a safety plan; Medication compliance and effectiveness             -- Discharge Goals: Return  home with outpatient referrals for mental health follow-up including medication management/psychotherapy        Total Time Spent in Direct Patient Care:  I personally spent 30 minutes on the unit in direct patient care. The direct patient care time included face-to-face time with the patient, reviewing the patient's chart, communicating with other professionals, and coordinating care. Greater than 50% of this time  was spent in counseling or coordinating care with the patient regarding goals of hospitalization, psycho-education, and discharge planning needs.   Cena Benton, Medical Student 05/28/2022, 11:27 AM

## 2022-05-28 NOTE — Progress Notes (Signed)
   05/28/22 0937  Psych Admission Type (Psych Patients Only)  Admission Status Voluntary  Psychosocial Assessment  Patient Complaints Anxiety;Depression  Eye Contact Fair  Facial Expression Flat;Sullen;Sad  Affect Depressed;Sad  Speech Soft  Interaction Minimal  Motor Activity Slow  Appearance/Hygiene Unremarkable  Behavior Characteristics Cooperative  Mood Depressed;Sad  Thought Process  Coherency WDL  Content WDL  Delusions None reported or observed  Perception WDL  Hallucination None reported or observed  Judgment Limited  Confusion None  Danger to Self  Current suicidal ideation? Denies  Self-Injurious Behavior No self-injurious ideation or behavior indicators observed or expressed   Agreement Not to Harm Self Yes  Description of Agreement verbal  Danger to Others  Danger to Others None reported or observed  Danger to Others Abnormal  Harmful Behavior to others No threats or harm toward other people  Destructive Behavior No threats or harm toward property

## 2022-05-28 NOTE — Progress Notes (Signed)
Pt affect is anxious and depressed. Pt tearful at times. Pt rates depression 4/10 and anxiety 7/10. Discussed coping skills with pt and pt given word searches/crossword puzzles.Pt reports a poor appetite, and no physical problems. Pt denies SI/HI/AVH and verbally contracts for safety. Provided support and encouragement. Pt safe on the unit. Q 15 minute safety checks continued.

## 2022-05-28 NOTE — Group Note (Signed)
Recreation Therapy Group Note   Group Topic:Problem Solving  Group Date: 05/28/2022 Start Time: 0935 End Time: 0950 Facilitators: Caroll Rancher, LRT,CTRS Location: [address] Dayroom   Goal Area(s) Addresses:  Patient will effectively work with peer towards shared goal.  Patient will identify skills used to make activity successful.  Patient will identify how skills used during activity can be used to reach post d/c goals.    Group Description:  Landing Pad. In teams of 3-5, patients were given 12 plastic drinking straws and an equal length of masking tape. Using the materials provided, patients were asked to build a landing pad to catch a golf ball dropped from approximately 5 feet in the air. All materials were required to be used by the team in their design. LRT facilitated post-activity discussion.   Affect/Mood: N/A   Participation Level: Did not attend    Clinical Observations/Individualized Feedback:     Plan: Continue to engage patient in RT group sessions 2-3x/week.   Caroll Rancher, LRT,CTRS 05/28/2022 12:16 PM

## 2022-05-29 LAB — HEMOGLOBIN A1C
Hgb A1c MFr Bld: 5 % (ref 4.8–5.6)
Mean Plasma Glucose: 96.8 mg/dL

## 2022-05-29 MED ORDER — QUETIAPINE FUMARATE 25 MG PO TABS: 25.0000 mg | ORAL_TABLET | Freq: Three times a day (TID) | ORAL | Status: DC | PRN

## 2022-05-29 NOTE — Progress Notes (Signed)
D:  Patient denied SI and HI, contracts for safety.  Denied A/V hallucinations.   A:  Medications administered per MD orders.  Emotional support and encouragement given patient. R:  Safety maintained with 15 minute checks.  

## 2022-05-29 NOTE — Progress Notes (Addendum)
[facility] MD Progress Note  05/29/2022 9:59 AM Linda [name]  MRN:  [id number]   Reason for Admission:  Linda [name] is a 31 y.o. female with a history of MDD, GAD, PTSD with some report of history of bipolar disorder, who was initially admitted for inpatient psychiatric hospitalization on 05/26/2022 for management of increased depression and anxiety, SI. The patient is currently on Hospital Day 3.   Chart Review from last 24 hours:  The patient's chart was reviewed and nursing notes were reviewed. The patient's case was discussed in multidisciplinary team meeting. Per [facility] patient is compliant with her medications on the unit have used Vistaril as needed but reports no efficacy for anxiety.  Staff reports patient to be anxious when on the unit and spent most of the time in her room.  Information Obtained Today During Patient Interview: The patient was seen and evaluated on the unit. On assessment today the patient reports anxiety mainly in crowded but attends groups because she feels comfortable with the staff she reports better sleep last night but continues to report anxiety and depression scale at 7 out of 1010 being the worst.  She reports feeling hopeless helpless and occasionally wishing self dead mainly related to ongoing depression and anxiety and "not feeling better" she denies active SI intention or plan, denies HI or AVH.  Reports Vistaril not helpful for anxiety discussed with patient will start Seroquel as needed for anxiety in addition to scheduled Seroquel.  She denies side effect to current medication regimen.   Sleep  Sleep: Improved, 7 hours  Principal Problem: Bipolar 2 disorder, major depressive episode (HCC) Diagnosis: Principal Problem:   Bipolar 2 disorder, major depressive episode (HCC) Active Problems:   Chronic anxiety   Fibromyalgia   Hashimoto's thyroiditis   Pure hypercholesterolemia    Past Psychiatric History: Prior Psychiatric diagnoses: Post-partum  MDD, PTSD, GAD w/ Panic Attacks vs. Panic Disorder Past Psychiatric Hospitalizations: none found on brief review of charts   History of self mutilation: declined Past suicide attempts: declined Past history of HI, violent or aggressive behavior: declined   Past Psychiatric medications trials: Celexa (worked for 7 years following post-partum depression but became ineffective), Effexor, Lexapro, Gabitril, Oxcarbazepine,  History of ECT/TMS: no   Outpatient psychiatric Follow up: Dr. Mariane Masters (requesting new psychiatrist following PHP) Prior Outpatient Therapy: none (was followed 14-18 by [facility] per chart)  Past Medical History:  Past Medical History:  Diagnosis Date   ADD (attention deficit disorder)    Asthma    Fibromyalgia    GAD (generalized anxiety disorder)    GERD (gastroesophageal reflux disease)    Hashimoto's thyroiditis    Heart murmur    IBS (irritable bowel syndrome)    Interstitial cystitis    postpartum depression     Past Surgical History:  Procedure Laterality Date   APPENDECTOMY     CESAREAN SECTION N/A 03/20/2013   Procedure: CESAREAN SECTION;  Surgeon: [name], MD;  Location: [facility];  Service: Obstetrics;  Laterality: N/A;   CESAREAN SECTION     gastric     gasric bypass   LAPAROSCOPIC APPENDECTOMY N/A 10/16/2019   Procedure: APPENDECTOMY LAPAROSCOPIC;  Surgeon: Franky Macho, MD;  Location: [facility];  Service: General;  Laterality: N/A;   MYRINGOTOMY WITH TUBE PLACEMENT     NO PAST SURGERIES     TYMPANOSTOMY TUBE PLACEMENT     UPPER GI ENDOSCOPY     Family History:  Family History  Problem Relation Age  of Onset   Cancer Paternal Uncle    COPD Maternal Grandfather    Stroke Paternal Grandfather    Depression Mother    OCD Mother    Anxiety disorder Mother    Rheum arthritis Mother    Fibromyalgia Mother    Drug abuse Maternal Uncle    Depression Maternal Uncle    Anxiety disorder Maternal Uncle    Depression Maternal Grandmother     Anxiety disorder Brother    Healthy Brother    Dementia Paternal Grandmother    Autoimmune disease Father    ADD / ADHD Son    Healthy Son    Family Psychiatric  History: Psychiatric illness: maternal aunt suspected with bipolar Suicide: no Substance Abuse: no Social History: Living situation: lives at home Social support: with husband and supportive mother Marital Status: second marriage, dating for 8 years, married for 2 Children: 19 41-year-old boy Education: high school, trade school Employment: [employer] fast streamer service: no Legal history: no Trauma: physical and emotional Access to guns: yes  Sleep: Improved Good  Appetite: Fair Fair  Current Medications: Current Facility-Administered Medications  Medication Dose Route Frequency Provider Last Rate Last Admin   acetaminophen (TYLENOL) tablet 650 mg  650 mg Oral Q6H PRN Park Pope, MD       albuterol (VENTOLIN HFA) 108 (90 Base) MCG/ACT inhaler 1-2 puff  1-2 puff Inhalation Q6H PRN Park Pope, MD       DULoxetine (CYMBALTA) DR capsule 30 mg  30 mg Oral BID Abbott Pao, Edy Mcbane, MD   30 mg at 05/29/22 5573   gabapentin (NEURONTIN) capsule 100 mg  100 mg Oral TID Sarita Bottom, MD   100 mg at 05/29/22 2202   levothyroxine (SYNTHROID) tablet 150 mcg  150 mcg Oral Q0600 Abbott Pao, Damontay Alred, MD   150 mcg at 05/29/22 0606   loratadine (CLARITIN) tablet 10 mg  10 mg Oral Daily Park Pope, MD   10 mg at 05/28/22 1025   nicotine (NICODERM CQ - dosed in mg/24 hr) patch 7 mg  7 mg Transdermal Daily Abbott Pao, Judea Fennimore, MD   7 mg at 05/29/22 5427   nystatin (MYCOSTATIN/NYSTOP) topical powder   Topical BID Park Pope, MD   Given at 05/29/22 0822   pantoprazole (PROTONIX) EC tablet 80 mg  80 mg Oral Daily Park Pope, MD   80 mg at 05/28/22 1025   polyethylene glycol (MIRALAX / GLYCOLAX) packet 17 g  17 g Oral BID Park Pope, MD       QUEtiapine (SEROQUEL) tablet 25 mg  25 mg Oral BID Abbott Pao, Calley Drenning, MD   25 mg at 05/29/22 0824   QUEtiapine (SEROQUEL)  tablet 25 mg  25 mg Oral TID PRN Sarita Bottom, MD       QUEtiapine (SEROQUEL) tablet 400 mg  400 mg Oral QHS Juanita Devincent, MD   400 mg at 05/28/22 2117   senna-docusate (Senokot-S) tablet 1 tablet  1 tablet Oral BID Park Pope, MD       simethicone [facility]) chewable tablet 80 mg  80 mg Oral Q6H PRN Park Pope, MD        Lab Results: No results found for this or any previous visit (from the past 48 hour(s)).  Blood Alcohol level:  Lab Results  Component Value Date   [facility] <10 05/22/2022   ETH <10 05/16/2022    Metabolic Disorder Labs: Lab Results  Component Value Date   HGBA1C 5.4 06/05/2020   No results found for: "PROLACTIN" Lab Results  Component  Value Date   CHOL 166 05/23/2022   TRIG 67 05/23/2022   HDL 32 (L) 05/23/2022   CHOLHDL 5.2 05/23/2022   VLDL 13 05/23/2022   LDLCALC 121 (H) 05/23/2022    Physical Findings: AIMS: Facial and Oral Movements Muscles of Facial Expression: None, normal Lips and Perioral Area: None, normal Jaw: None, normal Tongue: None, normal,Extremity Movements Upper (arms, wrists, hands, fingers): None, normal Lower (legs, knees, ankles, toes): None, normal, Trunk Movements Neck, shoulders, hips: None, normal, Overall Severity Severity of abnormal movements (highest score from questions above): None, normal Incapacitation due to abnormal movements: None, normal Patient's awareness of abnormal movements (rate only patient's report): No Awareness, Dental Status Current problems with teeth and/or dentures?: No Does patient usually wear dentures?: No  CIWA:  CIWA-Ar Total: 2 COWS:  COWS Total Score: 1  Musculoskeletal: Strength & Muscle Tone: within normal limits Gait & Station: normal Patient leans: N/A  Psychiatric Specialty Exam:  General Appearance: appears at stated age, fairly dressed and groomed  Behavior: Calm and cooperative  Psychomotor Activity:No psychomotor agitation or retardation noted                                         Eye Contact: Fair Speech: Decreased tone and volume   Mood: Dysphoric and anxious Affect: Congruent  Thought Process: Linear and goal-directed Descriptions of Associations: intact Thought Content: Hallucinations: Denies AH, VH  Delusions: No paranoia  Suicidal Thoughts: Reports on and off passive SI wishing self dead but denies SI, intention, plan, able to contract for safety in the hospital Homicidal Thoughts: Denies HI, intention, plan   Alertness/Orientation: Alert and fully oriented  Insight: Limited  Judgment: Limited  Memory: Intact  Executive Functions  Concentration: Intact Attention Span: Fair Recall: Intact Fund of Knowledge: Fair   Assets  Assets:Communication Skills; Desire for Improvement; Financial Resources/Insurance; Intimacy; Leisure Time; Housing; Resilience; Social Support; Talents/Skills; Physical Health; Transportation    Physical Exam: Physical Exam ROS Blood pressure (!) 88/60, pulse (!) 102, temperature 97.7 F (36.5 C), temperature source Oral, resp. rate 15, height 5\' 5"  (1.651 m), weight 92.6 kg, last menstrual period 05/08/2022, SpO2 100 %. Body mass index is 33.98 kg/m.   Treatment Plan Summary:   ASSESSMENT:  Diagnoses / Active Problems: Principal Problem: Bipolar 2 disorder, major depressive episode (HCC) Diagnosis: Principal Problem:   Bipolar 2 disorder, major depressive episode (HCC) Active Problems:   Chronic anxiety   Fibromyalgia   Hashimoto's thyroiditis   Pure hypercholesterolemia   PLAN:  Safety and Monitoring:             -- Voluntary admission to inpatient psychiatric unit for safety, stabilization and treatment             -- Daily contact with patient to assess and evaluate symptoms and progress in treatment             -- Patient's case to be discussed in multi-disciplinary team meeting             -- Observation Level : q15 minute checks             -- Vital signs:  q12 hours             --  Precautions: suicide, elopement, and assault             -- patient with no roommate order to prevent PTSD  triggering   2. 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             -- Continue nighttime Quetiapine to 400 mg qhs for mood, anxiety, sleep             -- Continue Seroquel 25 mg Quetiapine daily at 8 AM & 2 PM for anxiety             -- Levothyroxine 150 mcg for hypothyroidism             -- Gabapentin 100 mg TID for anxiety and mood, also will help with chronic pain             -- Loratadine 10 mg daily for seasonal allergy/home meds             -- patient declining miralax & senokot             PRNs:             -- Discontinue hydroxyzine for lack of efficacy, start Seroquel as needed for anxiety             -- Tylenol, Albuterol, Simethicone   --  The risks/benefits/side-effects/alternatives to this medication were discussed in detail with the patient and time was given for questions. The patient consents to medication trial.              -- Metabolic profile and EKG monitoring obtained while on an atypical antipsychotic (BMI: Lipid Panel: HbgA1c: QTc:) HDL: 32, LDL: 121   ##Intertrigo             -- Nystatin topical powder 2 times daily   ##GERD             -- Continue Pantoprazole 80 mg PO daily   ##Dyslipidemia (HDL 32; LDL: 121)             -- F/u with outpatient provider                3. Labs Reviewed: CBC, CMP, Lipid Profile, Thyroid Profile, Utox, Blood Tox, Pregnancy Test, and Infectious Disease testing without significant concern Hemoglobin A1c 5.0      Lab ordered: None   4. Tobacco Use Disorder             -- Nicotine patch 7 mg/24 hours ordered             -- Smoking cessation  encouraged   5. Group and Therapy: -- Encouraged patient to participate in unit milieu and in scheduled group therapies              --Substance Use counseling: not indicated   6. Discharge Planning:              -- Social work and case management to assist with discharge planning and identification of hospital follow-up needs prior to discharge             -- Estimated LOS: 4 days             -- Discharge Concerns: Need to establish a safety plan; Medication compliance and effectiveness             -- Discharge  Goals: Return home with outpatient referrals for mental health follow-up including medication management/psychotherapy    Total Time Spent in Direct Patient Care:  I personally spent 35 minutes on the unit in direct patient care. The direct patient care time included face-to-face time with the patient, reviewing the patient's chart, communicating with other professionals, and coordinating care. Greater than 50% of this time was spent in counseling or coordinating care with the patient regarding goals of hospitalization, psycho-education, and discharge planning needs.   Leldon Steege Abbott Pao, MD 05/29/2022, 9:59 AM

## 2022-05-29 NOTE — Progress Notes (Signed)
Adult Psychoeducational Group Note  Pt did not attend the orientation group. 

## 2022-05-29 NOTE — Progress Notes (Signed)
Adult Psychoeducational Group Note  Date:  05/29/2022 Time:  8:33 PM  Group Topic/Focus:  Wrap-Up Group:   The focus of this group is to help patients review their daily goal of treatment and discuss progress on daily workbooks.  Participation Level:  Active  Participation Quality:  Appropriate  Affect:  Appropriate  Cognitive:  Appropriate  Insight: Appropriate  Engagement in Group:  Developing/Improving  Modes of Intervention:  Discussion  Additional Comments:  Pt stated her goal for today was to focus on her treatment plan. Pt stated she accomplished her goal today. Pt stated she did not get a chance to speak with a doctor or with a social worker about her care today. Pt rated her overall day a 5 out of 10. Pt stated she was able to contact her mother and her husband today which improved her overall day. Pt stated she felt better about herself tonight. Pt stated she was able to attend all meals today. Pt stated she took all medications provided today. Pt stated her appetite was improving today. Pt rated her sleep last night was fair. Pt stated the goal tonight was to get some rest. Pt stated she had no physical pain tonight. Pt deny visual hallucinations and auditory issues tonight. Pt denies thoughts of harming herself or others. Pt stated she would alert staff if anything changed.  Linda [name] 05/29/2022, 8:33 PM

## 2022-05-29 NOTE — Plan of Care (Signed)
Nure discussed anxiety, depression and coping skills with patient.  

## 2022-05-29 NOTE — Group Note (Signed)
LCSW Group Therapy Note  05/29/2022   10:30-11:30am   Topic:  Anger and its Underlying Emotions  Participation Level:  Did Not Attend  Description of Group:   In this group, patients identified the primary emotions they often have in situations that eventually provoke them to anger.  Focus was placed on how helpful it is to recognize the underlying emotions to anger in order to address these for more permanent resolution.  Emphasis was also on identifying possible replacement thoughts for the automatic thoughts generated in various situations shared by the group.  Therapeutic Goals: Patients will share emotions that commonly incite their anger and how they typically respond Patients will identify how their coping skills work for them and/or against them Patients will explore possible alternative thoughts to their automatic ones Patients will learn that anger itself is normal and that healthier reactions can assist with resolving conflict rather than worsening situations  Summary of Patient Progress:  The patient was invited to group, did not attend.  Therapeutic Modalities:   Cognitive Behavioral Therapy Processing  Ciara Kagan J Grossman-Orr, MSW, LCSW  

## 2022-05-29 NOTE — Progress Notes (Signed)
   05/29/22 2149  Psych Admission Type (Psych Patients Only)  Admission Status Voluntary  Psychosocial Assessment  Patient Complaints Depression  Eye Contact Fair  Facial Expression Anxious  Affect Appropriate to circumstance  Speech Logical/coherent  Interaction Minimal  Motor Activity Other (Comment) (WDL)  Appearance/Hygiene Unremarkable  Behavior Characteristics Appropriate to situation  Mood Depressed  Thought Process  Coherency WDL  Content WDL  Delusions None reported or observed  Perception WDL  Hallucination None reported or observed  Judgment Limited  Confusion None  Danger to Self  Current suicidal ideation? Denies  Self-Injurious Behavior No self-injurious ideation or behavior indicators observed or expressed   Agreement Not to Harm Self Yes  Description of Agreement verbal  Danger to Others  Danger to Others None reported or observed  Danger to Others Abnormal  Harmful Behavior to others No threats or harm toward other people  Destructive Behavior No threats or harm toward property

## 2022-05-30 MED ORDER — LORAZEPAM 0.5 MG PO TABS
0.5000 mg | ORAL_TABLET | Freq: Four times a day (QID) | ORAL | Status: DC | PRN
  Administered 2022-05-30 – 2022-05-31 (×2): 0.5 mg via ORAL
  Filled 2022-05-30 (×3): qty 1

## 2022-05-30 NOTE — Group Note (Signed)
[facility] LCSW Group Therapy Note  Date/Time:  05/30/2022 10:00am-11:00am  Type of Therapy and Topic:  Group Therapy:  Healthy and Unhealthy Supports  Participation Level:  Active   Description of Group:  Patients in this group were introduced to the idea of adding a variety of healthy supports to address the various needs in their lives.  Patients discussed what additional healthy supports could be helpful in their recovery and wellness after discharge in order to prevent future hospitalizations such as counselor, doctor, other levels of psychiatric care such as ACTT services, therapy groups, 12-step groups, and problem-specific support groups.  A demonstration was given about how to set boundaries which patients expressed was beneficial.  Several songs were played to inspire patients to be more self-supportive.  Therapeutic Goals:   1)  discuss importance of adding supports to stay well once out of the hospital  2)  compare healthy versus unhealthy supports and identify some examples of each  3)  generate ideas and descriptions of healthy supports that can be added  4)  offer mutual support about how to address unhealthy supports  5)  encourage active participation in and adherence to discharge plan    Summary of Patient Progress:  The patient stated that current healthy supports in her life are mother, father, and husband.  The patient expressed little else during group but was attentive to the entire discussion.   Therapeutic Modalities:   Motivational Interviewing Brief Solution-Focused Therapy  Ambrose Mantle, LCSW

## 2022-05-30 NOTE — Group Note (Signed)
Date:  05/30/2022 Time:  10:38 AM  Group Topic/Focus:  Orientation:   The focus of this group is to educate the patient on the purpose and policies of crisis stabilization and provide a format to answer questions about their admission.  The group details unit policies and expectations of patients while admitted.    Participation Level:  Active  Participation Quality:  Appropriate  Affect:  Appropriate  Cognitive:  Appropriate  Insight: Appropriate  Engagement in Group:  Engaged  Modes of Intervention:  Discussion  Additional Comments:    Reymundo Poll 05/30/2022, 10:38 AM

## 2022-05-30 NOTE — Plan of Care (Signed)
Nurse discussed anxiety, depression and coping skills with patient.  

## 2022-05-30 NOTE — Progress Notes (Signed)
D:  Patient's self inventory sheet, patient has fair sleep, no sleep medicine.  Fair appetite, low energy level.  Rated depression 6, hopeless and anxiety 5.  Denied withdrawals.  Denied SI.  Physical problems, numb arms, legs and face.  Denied physical pain.  Goal is lower anxiety and depression.  Plans to attend groups.  Needs anxiety, depression and one on one therapy classes. A:  Medications administered per MD orders.  Emotional support and encouragement given patient. R:  Denied SI and HI, contracts for safety.  Denied A/V hallucinations.  Patient stated she slept 7 hours last night. Refused seroquel 25 p.o. this a.m.

## 2022-05-30 NOTE — Group Note (Signed)
Date:  05/30/2022 Time:  4:15 PM  Group Topic/Focus:  Crisis Planning:   The purpose of this group is to help patients create a crisis plan for use upon discharge or in the future, as needed.    Participation Level:  Minimal  Participation Quality:  Appropriate  Affect:  Appropriate  Cognitive:  Appropriate  Insight: Appropriate  Engagement in Group:  Lacking  Modes of Intervention:  Education  Additional Comments:  Educated patient on triggers that can cause mental health crisis, such as conflict with others, loss of job, and being overwhelmed with everyday life.   Reymundo Poll 05/30/2022, 4:15 PM

## 2022-05-30 NOTE — Progress Notes (Signed)
   05/30/22 2320  Psych Admission Type (Psych Patients Only)  Admission Status Voluntary  Psychosocial Assessment  Patient Complaints Anxiety;Depression  Eye Contact Fair  Facial Expression Anxious  Affect Appropriate to circumstance  Speech Logical/coherent  Interaction Assertive  Motor Activity Other (Comment) (WDL)  Appearance/Hygiene Unremarkable  Behavior Characteristics Appropriate to situation  Mood Anxious  Thought Process  Coherency WDL  Content WDL  Delusions None reported or observed  Perception WDL  Hallucination None reported or observed  Judgment Poor  Confusion None  Danger to Self  Current suicidal ideation? Denies  Description of Suicide Plan denies  Self-Injurious Behavior No self-injurious ideation or behavior indicators observed or expressed   Agreement Not to Harm Self Yes  Description of Agreement verbal  Danger to Others  Danger to Others None reported or observed  Danger to Others Abnormal  Harmful Behavior to others No threats or harm toward other people  Destructive Behavior No threats or harm toward property

## 2022-05-30 NOTE — Progress Notes (Signed)
[facility] MD Progress Note  05/30/2022 10:56 AM Linda [name]  MRN:  [id number]   Reason for Admission:  Linda [name] is a 31 y.o. female with a history of MDD, GAD, PTSD with some report of history of bipolar disorder, who was initially admitted for inpatient psychiatric hospitalization on 05/26/2022 for management of increased depression and anxiety, SI. The patient is currently on Hospital Day 4.   Chart Review from last 24 hours:  The patient's chart was reviewed and nursing notes were reviewed. The patient's case was discussed in multidisciplinary team meeting. Per [facility] patient is compliant with her medications on the unit have tried seroquel prn yesterday.  Information Obtained Today During Patient Interview: The patient was seen and evaluated in her room.  She reports feeling better today describing better depression and anxiety scales her depressed mood today 5 out of 10 and anxiety 6 out of 10.  She reports Seroquel during the daytime made her sluggish and dizzy and that is why she refuses this morning.  She has allergy listed to her BuSpar causing swelling.  She failed Vistaril earlier this hospitalization as it did not help.  She reports good sleep last night and fair appetite in general.  Today she denies feeling hopeless helpless or worthless denies passive SI wishing self dead since other night, denies SI HI or AVH.  Denies mood swings or easy irritability and reports improving crying spells.  Discussed with patient plan to start on Ativan as needed for anxiety, discussed with patient risk of tolerance and dependence on Ativan and the plan to use it only when as needed basis, patient agrees.  Patient does not have any history of alcohol or illicit drug use recently or in the past.  Patient denies any history of misuse or abuse of prescribed medications.    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 Mariane Masters (requesting new psychiatrist following PHP) Prior Outpatient Therapy: none (was followed 14-18 by [facility] per chart)  Past Medical History:  Past Medical History:  Diagnosis Date   ADD (attention deficit disorder)    Asthma    Fibromyalgia    GAD (generalized anxiety disorder)    GERD (gastroesophageal reflux disease)    Hashimoto's thyroiditis    Heart murmur    IBS (irritable bowel syndrome)    Interstitial cystitis    postpartum depression     Past Surgical History:  Procedure Laterality Date   APPENDECTOMY     CESAREAN SECTION N/A 03/20/2013   Procedure: CESAREAN SECTION;  Surgeon: [name], MD;  Location: [facility];  Service: Obstetrics;  Laterality: N/A;   CESAREAN SECTION     gastric     gasric bypass   LAPAROSCOPIC APPENDECTOMY N/A 10/16/2019   Procedure: APPENDECTOMY LAPAROSCOPIC;  Surgeon: Franky Macho, MD;  Location: [facility];  Service: General;  Laterality: N/A;   MYRINGOTOMY WITH TUBE PLACEMENT     NO PAST SURGERIES     TYMPANOSTOMY TUBE PLACEMENT     UPPER GI ENDOSCOPY     Family History:  Family History  Problem Relation Age of Onset   Cancer Paternal Uncle    COPD Maternal Grandfather    Stroke Paternal Grandfather     Depression Mother    OCD Mother    Anxiety disorder Mother    Rheum arthritis Mother    Fibromyalgia Mother    Drug abuse Maternal Uncle    Depression Maternal Uncle    Anxiety disorder Maternal Uncle    Depression Maternal Grandmother    Anxiety disorder Brother    Healthy Brother    Dementia Paternal Grandmother    Autoimmune disease Father    ADD / ADHD Son    Healthy Son    Family Psychiatric  History: Psychiatric illness: maternal aunt suspected with bipolar Suicide: no Substance Abuse: no Social History: Living situation: lives at home Social support: with husband and supportive mother Marital Status: second marriage, dating for 8 years, married for 2 Children: 1 9-year-old boy Education: high school, trade school Employment: [employer] service: no Legal history: no Trauma: physical and emotional Access to guns: yes  Sleep: Improved Good  Appetite: Fair Fair  Current Medications: Current Facility-Administered Medications  Medication Dose Route Frequency Provider Last Rate Last Admin   acetaminophen (TYLENOL) tablet 650 mg  650 mg Oral Q6H PRN Park Pope, MD       albuterol (VENTOLIN HFA) 108 (90 Base) MCG/ACT inhaler 1-2 puff  1-2 puff Inhalation Q6H PRN Park Pope, MD       DULoxetine (CYMBALTA) DR capsule 30 mg  30 mg Oral BID Abbott Pao, Exilda Wilhite, MD   30 mg at 05/30/22 0806   gabapentin (NEURONTIN) capsule 100 mg  100 mg Oral TID Abbott Pao, Delsie Amador, MD   100 mg at 05/30/22 0806   levothyroxine (SYNTHROID) tablet 150 mcg  150 mcg Oral Q0600 Abbott Pao, Nareg Breighner, MD   150 mcg at 05/30/22 0613   loratadine (CLARITIN) tablet 10 mg  10 mg Oral Daily Park Pope, MD   10 mg at 05/30/22 0900   nicotine (NICODERM CQ - dosed in mg/24 hr) patch 7 mg  7 mg Transdermal Daily Skylur Fuston, MD   7 mg at 05/29/22 0823   pantoprazole (PROTONIX) EC tablet 80 mg  80 mg Oral Daily Park Pope, MD   80 mg at 05/30/22 0900   polyethylene glycol (MIRALAX / GLYCOLAX) packet 17 g  17 g Oral  BID Park Pope, MD   17 g at 05/30/22 0819   QUEtiapine (SEROQUEL) tablet 25 mg  25 mg Oral BID Abbott Pao, Ieesha Abbasi, MD   25 mg at 05/29/22 1500   QUEtiapine (SEROQUEL) tablet 25 mg  25 mg Oral TID PRN Sarita Bottom, MD       QUEtiapine (SEROQUEL) tablet 400 mg  400 mg Oral QHS Kalee Broxton, MD   400 mg at 05/29/22 2102   senna-docusate (Senokot-S) tablet 1 tablet  1 tablet Oral BID Park Pope, MD       simethicone [facility]) chewable tablet 80 mg  80 mg Oral Q6H PRN Park Pope, MD        Lab Results:  Results for orders placed or performed during the hospital encounter of 05/26/22 (from the past 48 hour(s))  Hemoglobin A1c     Status: None   Collection  Time: 05/29/22  6:34 AM  Result Value Ref Range   Hgb A1c MFr Bld 5.0 4.8 - 5.6 %    Comment: (NOTE) Pre diabetes:          5.7%-6.4%  Diabetes:              >6.4%  Glycemic control for   <7.0% adults with diabetes    Mean Plasma Glucose 96.8 mg/dL    Comment: Performed at [facility], [address], [place], [place] [address]    Blood Alcohol level:  Lab Results  Component Value Date   ETH <10 05/22/2022   ETH <10 05/16/2022    Metabolic Disorder Labs: Lab Results  Component Value Date   HGBA1C 5.0 05/29/2022   MPG 96.8 05/29/2022   No results found for: "PROLACTIN" Lab Results  Component Value Date   CHOL 166 05/23/2022   TRIG 67 05/23/2022   HDL 32 (L) 05/23/2022   CHOLHDL 5.2 05/23/2022   VLDL 13 05/23/2022   LDLCALC 121 (H) 05/23/2022    Physical Findings: AIMS: Facial and Oral Movements Muscles of Facial Expression: None, normal Lips and Perioral Area: None, normal Jaw: None, normal Tongue: None, normal,Extremity Movements Upper (arms, wrists, hands, fingers): None, normal Lower (legs, knees, ankles, toes): None, normal, Trunk Movements Neck, shoulders, hips: None, normal, Overall Severity Severity of abnormal movements (highest score from questions above): None, normal Incapacitation due to abnormal  movements: None, normal Patient's awareness of abnormal movements (rate only patient's report): No Awareness, Dental Status Current problems with teeth and/or dentures?: No Does patient usually wear dentures?: No   Musculoskeletal: Strength & Muscle Tone: within normal limits Gait & Station: normal Patient leans: N/A  Psychiatric Specialty Exam:  General Appearance: appears at stated age, fairly dressed and groomed  Behavior: Calm and cooperative  Psychomotor Activity:No psychomotor agitation or retardation noted                                        Eye Contact: Fair Speech: Within normal limits   Mood: Less dysphoric and less anxious Affect: Congruent  Thought Process: Linear and goal-directed Descriptions of Associations: intact Thought Content: Hallucinations: Denies AH, VH  Delusions: No paranoia  Suicidal Thoughts: Denies passive or active SI intention or plan Homicidal Thoughts: Denies HI, intention, plan   Alertness/Orientation: Alert and fully oriented  Insight: Improved yet limited Judgment: Improved gait limited  Memory: Intact  Executive Functions  Concentration: Intact Attention Span: Fair Recall: Intact Fund of Knowledge: Fair   Assets  Assets:Communication Skills; Desire for Improvement; Financial Resources/Insurance; Intimacy; Leisure Time; Housing; Resilience; Social Support; Talents/Skills; Physical Health; Transportation    Physical Exam: Physical Exam ROS Blood pressure 93/73, pulse 91, temperature 97.7 F (36.5 C), temperature source Oral, resp. rate 16, height 5\' 5"  (1.651 m), weight 92.6 kg, last menstrual period 05/08/2022, SpO2 99 %. Body mass index is 33.98 kg/m.   Treatment Plan Summary:   ASSESSMENT:  Diagnoses / Active Problems: Principal Problem: Bipolar 2 disorder, major depressive episode (HCC) Diagnosis: Principal Problem:   Bipolar 2 disorder, major depressive episode (HCC) Active Problems:   Chronic anxiety    Fibromyalgia   Hashimoto's thyroiditis   Pure hypercholesterolemia   PLAN:  Safety and Monitoring:             -- Voluntary admission to inpatient psychiatric unit for safety, stabilization and treatment             --  Daily contact with patient to assess and evaluate symptoms and progress in treatment             -- Patient's case to be discussed in multi-disciplinary team meeting             -- Observation Level : q15 minute checks             -- Vital signs:  q12 hours             -- Precautions: suicide, elopement, and assault             -- patient with no roommate order to prevent PTSD triggering   2. 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             -- Continue nighttime Quetiapine to 400 mg qhs for mood, anxiety, sleep             --Discontinue scheduled and as needed Seroquel during daytime for lack of efficacy and side effects of dizziness reported.  Start Ativan 0.5 mg every 6 hours as needed for anxiety and monitor effects and safety.             -- Levothyroxine 150 mcg for hypothyroidism             -- Gabapentin 100 mg TID for anxiety and mood, also will help with chronic pain             -- Loratadine 10 mg daily for seasonal allergy/home meds             -- patient declining miralax & senokot             PRNs:                            -- Tylenol, Albuterol, Simethicone   --  The risks/benefits/side-effects/alternatives to this medication were discussed in detail with the patient and time was given for questions. The patient consents to medication trial.              -- Metabolic profile and EKG monitoring obtained while on an atypical antipsychotic (BMI: Lipid Panel: HbgA1c: QTc:) HDL:  32, LDL: 121   ##Intertrigo             -- Nystatin topical powder 2 times daily   ##GERD             -- Continue Pantoprazole 80 mg PO daily   ##Dyslipidemia (HDL 32; LDL: 121)             -- F/u with outpatient provider                3. Labs Reviewed: CBC, CMP, Lipid Profile, Thyroid Profile, Utox, Blood Tox, Pregnancy Test, and Infectious Disease testing without significant concern Hemoglobin A1c 5.0      Lab ordered: None   4. Tobacco Use Disorder             -- Nicotine patch 7 mg/24 hours ordered             -- Smoking cessation encouraged   5. Group and Therapy: -- Encouraged patient to  participate in unit milieu and in scheduled group therapies              --Substance Use counseling: not indicated   6. Discharge Planning:              -- Social work and case management to assist with discharge planning and identification of hospital follow-up needs prior to discharge             -- Estimated LOS: 4 days             -- Discharge Concerns: Need to establish a safety plan; Medication compliance and effectiveness             -- Discharge Goals: Return home with outpatient referrals for mental health follow-up including medication management/psychotherapy    Total Time Spent in Direct Patient Care:  I personally spent 35 minutes on the unit in direct patient care. The direct patient care time included face-to-face time with the patient, reviewing the patient's chart, communicating with other professionals, and coordinating care. Greater than 50% of this time was spent in counseling or coordinating care with the patient regarding goals of hospitalization, psycho-education, and discharge planning needs.   Markevion Lattin Abbott PaoAttiah, MD 05/30/2022, 10:56 AM

## 2022-05-30 NOTE — Progress Notes (Signed)
Adult Psychoeducational Group Note  Date:  05/30/2022 Time:  9:08 PM  Group Topic/Focus:  Wrap-Up Group:   The focus of this group is to help patients review their daily goal of treatment and discuss progress on daily workbooks.  Participation Level:  Active  Participation Quality:  Appropriate  Affect:  Appropriate  Cognitive:  Appropriate  Insight: Appropriate  Engagement in Group:  Engaged  Modes of Intervention:  Discussion  Additional Comments:  her day was a 6. Her goad not to be depressed she did not achieved her goal coping skills talking  Chauncey Fischer 05/30/2022, 9:08 PM

## 2022-05-31 DIAGNOSIS — F3181 Bipolar II disorder: Principal | ICD-10-CM

## 2022-05-31 MED ORDER — QUETIAPINE FUMARATE 400 MG PO TABS: 400.0000 mg | ORAL_TABLET | Freq: Every day | ORAL | 0 refills | Status: DC

## 2022-05-31 MED ORDER — LORAZEPAM 0.5 MG PO TABS: 0.5000 mg | ORAL_TABLET | Freq: Four times a day (QID) | ORAL | 0 refills | Status: DC | PRN

## 2022-05-31 MED ORDER — ONDANSETRON 4 MG PO TBDP
4.0000 mg | ORAL_TABLET | Freq: Once | ORAL | Status: AC
  Administered 2022-05-31: 4 mg via ORAL
  Filled 2022-05-31 (×2): qty 1

## 2022-05-31 MED ORDER — NICOTINE 7 MG/24HR TD PT24: 7.0000 mg | MEDICATED_PATCH | Freq: Every day | TRANSDERMAL | 0 refills | Status: DC

## 2022-05-31 MED ORDER — GABAPENTIN 100 MG PO CAPS: 100.0000 mg | ORAL_CAPSULE | Freq: Three times a day (TID) | ORAL | 0 refills | Status: DC

## 2022-05-31 MED ORDER — LEVOTHYROXINE SODIUM 150 MCG PO TABS: 150.0000 ug | ORAL_TABLET | Freq: Every day | ORAL | 0 refills | Status: DC

## 2022-05-31 MED ORDER — DULOXETINE HCL 30 MG PO CPEP: 30.0000 mg | ORAL_CAPSULE | Freq: Two times a day (BID) | ORAL | 0 refills | Status: DC

## 2022-05-31 NOTE — Discharge Summary (Signed)
Physician Discharge Summary Note  Patient:  Linda [name] is an 31 y.o., female MRN:  [id number] DOB:  1991-02-16 Patient phone:  [phone] (home)  Patient address:   [address] [place] [address],  Total Time spent with patient: 45 minutes  Date of Admission:  05/26/2022 Date of Discharge: 05/31/2022   Reason for Admission:  Linda [name] is a 31 y.o., female with a past psychiatric history significant for GAD, MDD, PTSD, and pt/mom report of bipolar (NOS), PMH of Fibromyalgia, Hypothyroidism, IBS, and GERD, and PSH of emergency C-Section for placental abruption (03/20/13) who presents to the [facility] from [facility] for evaluation and management of suicidal ideation.  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 Mariane Masters (requesting new psychiatrist following PHP) Prior Outpatient Therapy: none (was followed 14-18 by [facility] per chart)  Past Medical History:  Past Medical History:  Diagnosis Date   ADD (attention deficit disorder)    Asthma    Fibromyalgia    GAD (generalized anxiety disorder)    GERD (gastroesophageal reflux disease)    Hashimoto's thyroiditis    Heart  murmur    IBS (irritable bowel syndrome)    Interstitial cystitis    postpartum depression     Past Surgical History:  Procedure Laterality Date   APPENDECTOMY     CESAREAN SECTION N/A 03/20/2013   Procedure: CESAREAN SECTION;  Surgeon: [name], MD;  Location: [facility];  Service: Obstetrics;  Laterality: N/A;   CESAREAN SECTION     gastric     gasric bypass   LAPAROSCOPIC APPENDECTOMY N/A 10/16/2019   Procedure: APPENDECTOMY LAPAROSCOPIC;  Surgeon: Franky Macho, MD;  Location: [facility];  Service: General;  Laterality: N/A;   MYRINGOTOMY WITH TUBE PLACEMENT     NO PAST SURGERIES     TYMPANOSTOMY TUBE PLACEMENT     UPPER GI ENDOSCOPY     Family History:  Family History  Problem Relation Age of Onset   Cancer Paternal Uncle    COPD Maternal Grandfather    Stroke Paternal Grandfather    Depression Mother    OCD Mother    Anxiety disorder Mother    Rheum arthritis Mother    Fibromyalgia Mother    Drug abuse Maternal Uncle    Depression Maternal Uncle    Anxiety disorder Maternal Uncle    Depression Maternal Grandmother    Anxiety disorder Brother    Healthy Brother    Dementia Paternal Grandmother    Autoimmune disease Father    ADD / ADHD Son    Healthy Son    Family Psychiatric  History: Psychiatric illness: maternal aunt suspected with bipolar Suicide: no Substance Abuse: no Social History:  Living situation: lives at home Social support: with husband and supportive mother Marital Status: second marriage, dating for 8 years, married for 2 Children:  52 27-year-old boy Education: high school, trade school Employment: [employer] streamer service: no Legal history: no Trauma: physical and emotional Access to guns: yes  Social History   Substance and Sexual Activity  Alcohol Use Not Currently   Comment: occasionally     Social History   Substance and Sexual Activity  Drug Use Yes   Types: Benzodiazepines   Comment: 1 mg of Xanax PRN, was  prescribed 3-4 years ago, last used on Thursday    Social History   Socioeconomic History   Marital status: Married    Spouse name: Not on file   Number of children: Not on file   Years of education: Not on file   Highest education level: Not on file  Occupational History   Not on file  Tobacco Use   Smoking status: Every Day    Packs/day: 1.00    Types: Cigarettes    Start date: 03/15/2021   Smokeless tobacco: Never  Vaping Use   Vaping Use: Former  Substance and Sexual Activity   Alcohol use: Not Currently    Comment: occasionally   Drug use: Yes    Types: Benzodiazepines    Comment: 1 mg of Xanax PRN, was prescribed 3-4 years ago, last used on Thursday   Sexual activity: Yes    Partners: Male    Birth control/protection: None  Other Topics Concern   Not on file  Social History Narrative   Married with a 2 y.o. son.    Social Determinants of Health   Financial Resource Strain: Not on file  Food Insecurity: Not on file  Transportation Needs: Not on file  Physical Activity: Not on file  Stress: Not on file  Social Connections: Not on file    Hospital Course:   During the patient's hospitalization, patient had extensive initial psychiatric evaluation, and follow-up psychiatric evaluations every day.   Psychiatric diagnoses provided upon initial assessment: depression and anxiety   Patient's psychiatric medications were adjusted on admission:  -- Duloxetine 30 mg daily for depression and anxiety, also will help with pain sec to fibromyalgia.             -- Quetiapine 300 mg qhs for mood, anxiety and will help with sleep             -- Levothyroxine 175 mcg daily for hypothyroidism/home meds             -- Loratadine 10 mg daily for seasonal allergy/home meds             -- patient declining miralax & senokot             PRNs:             -- Hydroxyzine 10 mg  TID prn for anxiety             -- Gabapentin 100 mg TID for anxiety and mood, also will help with chronic  pain   During the hospitalization, other adjustments were made to the patient's psychiatric medication regimen: seroquel was titrated up to 400 m,g at bedtime for mood and sleep, cymbalta was titrated up to 30 mg twice daily, patient failed vistaril and daytime seroquel for anxiety, ativan prn was added with good efficacy reported. dose of levothyroxine was decreased to daily to address abnormal lab work with recommendation to follow up with her pcp.   Patient's care was discussed during the interdisciplinary team meeting every day during the hospitalization.   The patient denies having  side effects to prescribed psychiatric medication.   Gradually, patient started adjusting to milieu. The patient was evaluated each day by a clinical provider to ascertain response to treatment. Improvement was noted by the patient's report of decreasing symptoms, improved sleep and appetite, affect, medication tolerance, behavior, and participation in unit programming.  Patient was asked each day to complete a self inventory noting mood, mental status, pain, new symptoms, anxiety and concerns.     Symptoms were reported as significantly decreased or resolved completely by discharge.    On day of discharge, the patient reports that their mood is stable. The patient denied having suicidal thoughts for more than 48 hours prior to discharge.  Patient denies having homicidal thoughts.  Patient denies having auditory hallucinations.  Patient denies any visual hallucinations or other symptoms of psychosis. The patient was motivated to continue taking medication with a goal of continued improvement in mental health.    The patient reports their target psychiatric symptoms of depression, anxiety, mood swings and passive si responded well to the psychiatric medications, and the patient reports overall benefit other psychiatric hospitalization. Supportive psychotherapy was provided to the patient. The patient also  participated in regular group therapy while hospitalized. Coping skills, problem solving as well as relaxation therapies were also part of the unit programming.   Labs were reviewed with the patient, and abnormal results were discussed with the patient.   The patient is able to verbalize their individual safety plan to this provider.   Behavioral Events: none   Restraints: none   Groups:attended and participated   Medications Changes: as above   D/C Medications: as above  Physical Findings: AIMS: Facial and Oral Movements Muscles of Facial Expression: None, normal Lips and Perioral Area: None, normal Jaw: None, normal Tongue: None, normal,Extremity Movements Upper (arms, wrists, hands, fingers): None, normal Lower (legs, knees, ankles, toes): None, normal, Trunk Movements Neck, shoulders, hips: None, normal, Overall Severity Severity of abnormal movements (highest score from questions above): None, normal Incapacitation due to abnormal movements: None, normal Patient's awareness of abnormal movements (rate only patient's report): No Awareness, Dental Status Current problems with teeth and/or dentures?: No Does patient usually wear dentures?: No  CIWA:  CIWA-Ar Total: 2 COWS:  COWS Total Score: 1  Musculoskeletal: Strength & Muscle Tone: within normal limits Gait & Station: normal Patient leans: N/A   Psychiatric Specialty Exam:  General Appearance: appears at stated age, fairly dressed and groomed  Behavior: pleasant and cooperative  Psychomotor Activity:No psychomotor agitation or retardation noted   Eye Contact: good Speech: normal amount, tone, volume and latency   Mood: euthymic Affect: congruent, pleasant and interactive  Thought Process: linear, goal directed, no circumstantial or tangential thought process noted, no racing thoughts or flight of ideas Descriptions of Associations: intact Thought Content: Hallucinations: denies AH, VH , does not appear  responding to stimuli Delusions: No paranoia or other delusions noted Suicidal Thoughts: denies SI, intention, plan  Homicidal Thoughts: denies HI, intention, plan   Alertness/Orientation: alert and fully oriented  Insight: fair, improved Judgment: fair, improved  Memory: intact  Executive Functions  Concentration: intact  Attention Span: Fair Recall: intact Fund of Knowledge: fair   Assets  Assets:Communication Skills; Desire for Improvement; Financial Resources/Insurance; Intimacy; Leisure Time; Housing; Resilience; Social Support; Talents/Skills; Physical Health; Transportation   Sleep Sleep:good, improved during hospital stay   Physical Exam:  Physical Exam Constitutional:      Appearance: Normal appearance.  HENT:     Head: Normocephalic and atraumatic.  Eyes:     Pupils: Pupils are equal, round, and reactive to light.  Pulmonary:     Effort: Pulmonary effort is normal.  Musculoskeletal:        General: Normal range of motion.     Cervical back: Normal range of motion.  Skin:    General: Skin is warm and dry.  Neurological:     General: No focal deficit present.     Mental Status: She is alert and oriented to person, place, and time.    Review of Systems  Constitutional: Negative.   Respiratory: Negative.    Cardiovascular: Negative.   Gastrointestinal: Negative.   Genitourinary: Negative.   Neurological: Negative.    Blood pressure 101/68, pulse (!) 107, temperature 98 F (36.7 C), temperature source Oral, resp. rate 16, height 5\' 5"  (1.651 m), weight 92.6 kg, last menstrual period 05/08/2022, SpO2 100 %. Body mass index is 33.98 kg/m.   Social History   Tobacco Use  Smoking Status Every Day   Packs/day: 1.00   Types: Cigarettes   Start date: 03/15/2021  Smokeless Tobacco Never   Tobacco Cessation:  A prescription for an FDA-approved tobacco cessation medication provided at discharge   Blood Alcohol level:  Lab Results  Component Value  Date   ETH <10 05/22/2022   ETH <10 05/16/2022    Metabolic Disorder Labs:  Lab Results  Component Value Date   HGBA1C 5.0 05/29/2022   MPG 96.8 05/29/2022   No results found for: "PROLACTIN" Lab Results  Component Value Date   CHOL 166 05/23/2022   TRIG 67 05/23/2022   HDL 32 (L) 05/23/2022   CHOLHDL 5.2 05/23/2022   VLDL 13 05/23/2022   LDLCALC 121 (H) 05/23/2022    See Psychiatric Specialty Exam and Suicide Risk Assessment completed by Attending Physician prior to discharge.  Discharge destination:  Home  Is patient on multiple antipsychotic therapies at discharge:  No   Has Patient had three or more failed trials of antipsychotic monotherapy by history:  No  Recommended Plan for Multiple Antipsychotic Therapies: NA  Discharge Instructions     Diet - low sodium heart healthy   Complete by: As directed    Increase activity slowly   Complete by: As directed       Allergies as of 05/31/2022       Reactions   Banana Swelling   "Mouth swells up"   Buspar [buspirone] Anxiety   Ciprofloxacin Nausea Only   Levaquin [levofloxacin] Anxiety   Sulfa Antibiotics Diarrhea, Nausea And Vomiting        Medication List     STOP taking these medications    ALPRAZolam 1 MG tablet Commonly known as: XANAX   Gabitril 4 MG tablet Generic drug: tiaGABine   multivitamin with minerals Tabs tablet   Oxcarbazepine 300 MG tablet Commonly known as: TRILEPTAL       TAKE these medications      Indication  albuterol 108 (90 Base) MCG/ACT inhaler Commonly known as: VENTOLIN HFA Inhale 1-2 puffs into the lungs every 6 (six) hours as needed for wheezing or shortness of breath.  Indication: Asthma   DULoxetine 30 MG capsule Commonly known as: CYMBALTA Take 1 capsule (30 mg total) by mouth 2 (two) times daily. What changed: when to take this  Indication: Major Depressive Disorder   gabapentin 100 MG capsule Commonly known as: NEURONTIN Take 1 capsule (100 mg  total) by mouth 3 (three) times daily.  Indication: Social Anxiety Disorder   levothyroxine  150 MCG tablet Commonly known as: SYNTHROID Take 1 tablet (150 mcg total) by mouth daily at 6 (six) AM. Start taking on: June 01, 2022 What changed:  medication strength how much to take when to take this  Indication: Underactive Thyroid   LORazepam 0.5 MG tablet Commonly known as: ATIVAN Take 1 tablet (0.5 mg total) by mouth every 6 (six) hours as needed for anxiety.  Indication: Feeling Anxious   nicotine 7 mg/24hr patch Commonly known as: NICODERM CQ - dosed in mg/24 hr Place 1 patch (7 mg total) onto the skin daily.  Indication: Nicotine Addiction   omeprazole 40 MG capsule Commonly known as: PRILOSEC Take 40 mg by mouth daily.  Indication: Gastroesophageal Reflux Disease   QUEtiapine 400 MG tablet Commonly known as: SEROQUEL Take 1 tablet (400 mg total) by mouth at bedtime.  Indication: Manic-Depression        Follow-up Information     [facility], [facility]. Go on 06/02/2022.   Why: You have a hospital follow up assessment to obtain therapy and medication management services, on 06/02/22 at 9:00 am.    This appointment will be held in person.  This appointment may take up to 3 hours. Contact information: [address] [place] [address] [phone]                 Discharge recommendations:   Activity: as tolerated  Diet: heart healthy  # It is recommended to the patient to continue psychiatric medications as prescribed, after discharge from the hospital.     # It is recommended to the patient to follow up with your outpatient psychiatric provider and PCP.   # It was discussed with the patient, the impact of alcohol, drugs, tobacco have been there overall psychiatric and medical wellbeing, and total abstinence from substance use was recommended the patient.ed.   # Prescriptions provided or sent directly to preferred pharmacy at discharge.  Patient agreeable to plan. Given opportunity to ask questions. Appears to feel comfortable with discharge.    # In the event of worsening symptoms, the patient is instructed to call the crisis hotline, 911 and or go to the nearest ED for appropriate evaluation and treatment of symptoms. To follow-up with primary care provider for other medical issues, concerns and or health care needs   # Patient was discharged home with a plan to follow up as noted above.  -Follow-up with outpatient primary care doctor and other specialists -for management of chronic medical disease, including: PCP follow up recommended to address adjusted dose of levothyroxine.   Patient agrees with D/C instructions and plan.   The patient received suicide prevention pamphlet:  Yes Belongings returned:  Clothing and Valuables  Total Time Spent in Direct Patient Care:  I personally spent 45 minutes on the unit in direct patient care. The direct patient care time included face-to-face time with the patient, reviewing the patient's chart, communicating with other professionals, and coordinating care. Greater than 50% of this time was spent in counseling or coordinating care with the patient regarding goals of hospitalization, psycho-education, and discharge planning needs.    SignedSarita Bottom: Yarielys Beed, MD 05/31/2022, 1:28 PM

## 2022-05-31 NOTE — Progress Notes (Signed)
  [facility] Adult Case Management Discharge Plan :  Will you be returning to the same living situation after discharge:  Yes,  Home  At discharge, do you have transportation home?: Yes,  Family  Do you have the ability to pay for your medications: Yes,  Medicaid   Release of information consent forms completed and in the chart;  Patient's signature needed at discharge.  Patient to Follow up at:  Follow-up Information     [facility]. Go on 06/02/2022.   Why: You have a hospital follow up assessment to obtain therapy and medication management services, on 06/02/22 at 9:00 am.    This appointment will be held in person.  This appointment may take up to 3 hours. Contact information: [address] [place] [address] [phone]                 Next level of care provider has access to [facility] Link:no  Safety Planning and Suicide Prevention discussed: Yes,  with patient and husband      Has patient been referred to the Quitline?: Patient refused referral  Patient has been referred for addiction treatment: Pt. refused referral  Aram Beecham, LCSWA 05/31/2022, 10:33 AM

## 2022-05-31 NOTE — Plan of Care (Signed)
Nurse discussed anxiety, depression and coping skills. 

## 2022-05-31 NOTE — [facility] Suicide Risk Assessment (Signed)
[facility] Discharge Suicide Risk Assessment   Principal Problem: Bipolar 2 disorder, major depressive episode (HCC) Discharge Diagnoses: Principal Problem:   Bipolar 2 disorder, major depressive episode (HCC) Active Problems:   Chronic anxiety   Fibromyalgia   Hashimoto's thyroiditis   Pure hypercholesterolemia   Total Time spent with patient: 45 minutes  Reason for admission: Linda [name] is a 31 y.o., female with a past psychiatric history significant for GAD, MDD, PTSD, and pt/mom report of bipolar (NOS), PMH of Fibromyalgia, Hypothyroidism, IBS, and GERD, and PSH of emergency C-Section for placental abruption (03/20/13) who presents to the [facility] from [facility] for evaluation and management of suicidal ideation.  PTA Medications:  Medication Sig Dispense Refill             ALPRAZolam (XANAX) 1 MG tablet Take 1 mg by mouth 2 (two) times daily as needed for anxiety.       DULoxetine (CYMBALTA) 30 MG capsule Take 30 mg by mouth every morning.       GABITRIL 4 MG tablet Take 4 mg by mouth 2 (two) times daily.       levothyroxine (SYNTHROID) 175 MCG tablet Take 175 mcg by mouth daily.                            Oxcarbazepine (TRILEPTAL) 300 MG tablet Take 300 mg by mouth 2 (two) times daily as needed (anxiety).      Hospital Course:   During the patient's hospitalization, patient had extensive initial psychiatric evaluation, and follow-up psychiatric evaluations every day.  Psychiatric diagnoses provided upon initial assessment: depression and anxiety  Patient's psychiatric medications were adjusted on admission:  -- Duloxetine 30 mg daily for depression and anxiety, also will help with pain sec to fibromyalgia.             -- Quetiapine 300 mg qhs for mood, anxiety and will help with sleep             -- Levothyroxine 175 mcg daily for hypothyroidism/home meds             -- Loratadine 10 mg daily for seasonal allergy/home meds             -- patient declining  miralax & senokot             PRNs:             -- Hydroxyzine 10 mg  TID prn for anxiety             -- Gabapentin 100 mg TID for anxiety and mood, also will help with chronic pain  During the hospitalization, other adjustments were made to the patient's psychiatric medication regimen: seroquel was titrated up to 400 m,g at bedtime for mood and sleep, patient failed vistaril and daytime seroquel for anxiety, ativan prn was added with good efficacy reported. dose of levothyroxine was decreased to daily to address abnormal lab work with recommendation to follow up with her pcp.  Patient's care was discussed during the interdisciplinary team meeting every day during the hospitalization.  The patient denies having side effects to prescribed psychiatric medication.  Gradually, patient started adjusting to milieu. The patient was evaluated each day by a clinical provider to ascertain response to treatment. Improvement was noted by the patient's report of decreasing symptoms, improved sleep and appetite, affect, medication tolerance, behavior, and participation in unit programming.  Patient was asked each day to  complete a self inventory noting mood, mental status, pain, new symptoms, anxiety and concerns.    Symptoms were reported as significantly decreased or resolved completely by discharge.   On day of discharge, the patient reports that their mood is stable. The patient denied having suicidal thoughts for more than 48 hours prior to discharge.  Patient denies having homicidal thoughts.  Patient denies having auditory hallucinations.  Patient denies any visual hallucinations or other symptoms of psychosis. The patient was motivated to continue taking medication with a goal of continued improvement in mental health.   The patient reports their target psychiatric symptoms of depression, anxiety, mood swings and passive si responded well to the psychiatric medications, and the patient reports  overall benefit other psychiatric hospitalization. Supportive psychotherapy was provided to the patient. The patient also participated in regular group therapy while hospitalized. Coping skills, problem solving as well as relaxation therapies were also part of the unit programming.  Labs were reviewed with the patient, and abnormal results were discussed with the patient.  The patient is able to verbalize their individual safety plan to this provider.  Behavioral Events: none  Restraints: none  Groups:attended and participated  Medications Changes: as above  D/C Medications: as above  Sleep  Sleep:No data recorded  Musculoskeletal: Strength & Muscle Tone: within normal limits Gait & Station: normal Patient leans: N/A  Psychiatric Specialty Exam  General Appearance: appears at stated age, fairly dressed and groomed  Behavior: pleasant and cooperative  Psychomotor Activity:No psychomotor agitation or retardation noted   Eye Contact: good Speech: normal amount, tone, volume and latency   Mood: euthymic Affect: congruent, pleasant and interactive  Thought Process: linear, goal directed, no circumstantial or tangential thought process noted, no racing thoughts or flight of ideas Descriptions of Associations: intact Thought Content: Hallucinations: denies AH, VH , does not appear responding to stimuli Delusions: No paranoia or other delusions noted Suicidal Thoughts: denies SI, intention, plan  Homicidal Thoughts: denies HI, intention, plan   Alertness/Orientation: alert and fully oriented  Insight: fair, improved Judgment: fair, improved  Memory: intact  Executive Functions  Concentration: intact  Attention Span: Fair Recall: intact Fund of Knowledge: fair   Executive Functions  Concentration: intact Attention Span: Fair Recall: intact Fund of Knowledge: fair   Assets  Assets:Communication Skills; Desire for Improvement; Financial Resources/Insurance;  Intimacy; Leisure Time; Housing; Resilience; Social Support; Talents/Skills; Physical Health; Transportation   Physical Exam: Physical Exam ROS Blood pressure 101/68, pulse (!) 107, temperature 98 F (36.7 C), temperature source Oral, resp. rate 16, height 5\' 5"  (1.651 m), weight 92.6 kg, last menstrual period 05/08/2022, SpO2 100 %. Body mass index is 33.98 kg/m.  Mental Status Per Nursing Assessment::   On Admission:  Self-harm thoughts  Demographic Factors:  NA  Loss Factors: NA  Historical Factors: NA  Risk Reduction Factors:   Responsible for children under 62 years of age, Sense of responsibility to family, Employed, and Living with another person, especially a relative  Continued Clinical Symptoms: symptoms improved significantly during hospital stay. Bipolar Disorder:   Bipolar II  Cognitive Features That Contribute To Risk:  None    Suicide Risk:  Minimal: No identifiable suicidal ideation.  Patients presenting with no risk factors but with morbid ruminations; may be classified as minimal risk based on the severity of the depressive symptoms   Follow-up Information     Services, [facility]. Go on 06/02/2022.   Why: You have a hospital follow up assessment to obtain therapy and  medication management services, on 06/02/22 at 9:00 am.    This appointment will be held in person.  This appointment may take up to 3 hours. Contact information: [address] [place] [address] [phone]                 Plan Of Care/Follow-up recommendations:   Discharge recommendations:    Activity: as tolerated  Diet: heart healthy  # It is recommended to the patient to continue psychiatric medications as prescribed, after discharge from the hospital.     # It is recommended to the patient to follow up with your outpatient psychiatric provider and PCP.   # It was discussed with the patient, the impact of alcohol, drugs, tobacco have been there overall  psychiatric and medical wellbeing, and total abstinence from substance use was recommended the patient.ed.   # Prescriptions provided or sent directly to preferred pharmacy at discharge. Patient agreeable to plan. Given opportunity to ask questions. Appears to feel comfortable with discharge.    # In the event of worsening symptoms, the patient is instructed to call the crisis hotline, 911 and or go to the nearest ED for appropriate evaluation and treatment of symptoms. To follow-up with primary care provider for other medical issues, concerns and or health care needs   # Patient was discharged home with a plan to follow up as noted above.  -Follow-up with outpatient primary care doctor and other specialists -for management of chronic medical disease, including: patient has FU appt with her PCP, encouraged to comply given dose change of levothyroxine during this hospital stay.   Patient agrees with D/C instructions and plan.  The patient received suicide prevention pamphlet:  Yes Belongings returned:  Clothing and Valuables  Total Time Spent in Direct Patient Care:  I personally spent 45 minutes on the unit in direct patient care. The direct patient care time included face-to-face time with the patient, reviewing the patient's chart, communicating with other professionals, and coordinating care. Greater than 50% of this time was spent in counseling or coordinating care with the patient regarding goals of hospitalization, psycho-education, and discharge planning needs.   Vetta Couzens 05/31/2022, 10:26 AM   Lochlann Mastrangelo Abbott Pao, MD 05/31/2022, 10:26 AM

## 2022-05-31 NOTE — Group Note (Signed)
[facility] LCSW Group Therapy Note    Group Date: 05/31/2022 Start Time: 1300 End Time: 1400  Type of Therapy and Topic:  Group Therapy:  Overcoming Obstacles  Participation Level:  [facility] PARTICIPATION LEVEL: Minimal  Mood:  Description of Group:   In this group patients will be encouraged to explore what they see as obstacles to their own wellness and recovery. They will be guided to discuss their thoughts, feelings, and behaviors related to these obstacles. The group will process together ways to cope with barriers, with attention given to specific choices patients can make. Each patient will be challenged to identify changes they are motivated to make in order to overcome their obstacles. This group will be process-oriented, with patients participating in exploration of their own experiences as well as giving and receiving support and challenge from other group members.  Therapeutic Goals: 1. Patient will identify personal and current obstacles as they relate to admission. 2. Patient will identify barriers that currently interfere with their wellness or overcoming obstacles.  3. Patient will identify feelings, thought process and behaviors related to these barriers. 4. Patient will identify two changes they are willing to make to overcome these obstacles:    Summary of Patient Progress   Counselor educated patient on the relationships between thoughts, feelings, and behaviors.     Therapeutic Modalities:   Cognitive Behavioral Therapy Solution Focused Therapy Motivational Interviewing Relapse Prevention Therapy   Carlota Philley M Toniann Dickerson, LCSW 

## 2022-05-31 NOTE — B[facility] Counselor (Signed)
CSW spoke with the Pt who stated that the provider told he she would be attending PHP services with [facility] after discharge.  CSW informed the Pt that [facility] does not take clients who are not [facility].  CSW also informed that Pt that the care coordinator may not be able to place a referral for that services prior to her discharge today at 4:00pm.  CSW asked the Pt to inquire about CST, Groups, and other resources that are available at the [facility] in [place] during her appointment on 06/02/2022.  The agreed to do this.

## 2022-05-31 NOTE — Progress Notes (Signed)
Discharge Note:  Patient discharged home with family member.  Suicide prevention information given and discussed with patient who stated she understood and had no questions.  Patient denied SI and HI.  Denied A/V hallucinations.  Patient stated she appreciated all assistance from [facility] staff.  Patient stated she received all her belongings, clothing, toiletries, misc items.  All required discharge information given.

## 2022-05-31 NOTE — Progress Notes (Signed)
D:  Patient's self inventory sheet, patient sleeps good, no sleep medicine.  Good appetite, normal energy level, good concentration.  Rated depression 3, hopeless 2, anxiety 4.  Denied withdrawals.  Denied SI.  Denied physical problems.  Denied physical pain.  Goal is work to decrease BP.  Plans to take deep breaths, talk to people.  Ready for discharge.  Does have discharge plans. A:  Medications administered per MD orders.  Emotional support and encouragement given patient. R:  Denied SI and HI, contracts for safety.  Denied A/V hallucinations.  Safety maintained with 15 minute checks.

## 2022-05-31 NOTE — [facility] Group Notes (Signed)
Spiritual care group on grief and loss facilitated by chaplain Dyanne Carrel, [facility]   Group Goal:   Support / Education around grief and loss   Members engage in facilitated group support and psycho-social education.   Group Description:   Following introductions and group rules, group members engaged in facilitated group dialog and support around topic of loss, with particular support around experiences of loss in their lives. Group Identified types of loss (relationships / self / things) and identified patterns, circumstances, and changes that precipitate losses. Reflected on thoughts / feelings around loss, normalized grief responses, and recognized variety in grief experience. Group noted Worden's four tasks of grief in discussion.   Group drew on Adlerian / Rogerian, narrative, MI,   Patient Progress: Linda [name] attended group for a short period of time, but left shortly after.  [address] [facility] Pager, [phone]

## 2022-05-31 NOTE — Group Note (Signed)
Recreation Therapy Group Note   Group Topic:Stress Management  Group Date: 05/31/2022 Start Time: 0930 End Time: 1052 Facilitators: Caroll Rancher, [place] Location: [address] Dayroom   Goal Area(s) Addresses:  Patient will identify positive stress management techniques. Patient will identify benefits of using stress management post d/c.  Group Description:  Meditation.  LRT played a meditation that focused seeing your higher self.  It also speaks on renewing your mind, body and spirit as you go into your day.     Affect/Mood: N/A   Participation Level: Did not attend    Clinical Observations/Individualized Feedback:     Plan: Continue to engage patient in RT group sessions 2-3x/week.   Caroll Rancher, LRT,CTRS 05/31/2022 11:59 AM

## 2022-05-31 NOTE — [facility] Group Notes (Signed)
Group Notes Pt attended and contributed

## 2022-08-02 ENCOUNTER — Encounter (HOSPITAL_COMMUNITY): Payer: Self-pay | Admitting: Emergency Medicine

## 2022-08-02 ENCOUNTER — Other Ambulatory Visit: Payer: Self-pay

## 2022-08-02 ENCOUNTER — Emergency Department (HOSPITAL_COMMUNITY)
Admission: EM | Admit: 2022-08-02 | Discharge: 2022-08-02 | Disposition: A | Discharge status: Home / Self Care | Payer: Medicaid Other | Attending: Emergency Medicine | Admitting: Emergency Medicine

## 2022-08-02 DIAGNOSIS — Z20822 Contact with and (suspected) exposure to covid-19: Secondary | ICD-10-CM | POA: Insufficient documentation

## 2022-08-02 DIAGNOSIS — F419 Anxiety disorder, unspecified: Secondary | ICD-10-CM | POA: Insufficient documentation

## 2022-08-02 DIAGNOSIS — R002 Palpitations: Secondary | ICD-10-CM

## 2022-08-02 DIAGNOSIS — E039 Hypothyroidism, unspecified: Secondary | ICD-10-CM | POA: Diagnosis not present

## 2022-08-02 LAB — COMPREHENSIVE METABOLIC PANEL
ALT: 30 U/L (ref 0–44)
AST: 18 U/L (ref 15–41)
Albumin: 4 g/dL (ref 3.5–5.0)
Alkaline Phosphatase: 157 U/L — ABNORMAL HIGH (ref 38–126)
Anion gap: 10 (ref 5–15)
BUN: 10 mg/dL (ref 6–20)
CO2: 26 mmol/L (ref 22–32)
Calcium: 9.4 mg/dL (ref 8.9–10.3)
Chloride: 103 mmol/L (ref 98–111)
Creatinine, Ser: 0.59 mg/dL (ref 0.44–1.00)
GFR, Estimated: >60 mL/min (ref >60)
Glucose, Bld: 95 mg/dL (ref 70–99)
Potassium: 3.7 mmol/L (ref 3.5–5.1)
Sodium: 139 mmol/L (ref 135–145)
Total Bilirubin: 1.2 mg/dL (ref 0.3–1.2)
Total Protein: 7.7 g/dL (ref 6.5–8.1)

## 2022-08-02 LAB — ETHANOL: Alcohol, Ethyl (B): <10 mg/dL (ref <10)

## 2022-08-02 LAB — URINALYSIS, ROUTINE W REFLEX MICROSCOPIC
Bilirubin Urine: NEGATIVE
Glucose, UA: NEGATIVE mg/dL
Hgb urine dipstick: NEGATIVE
Ketones, ur: 5 mg/dL — AB
Nitrite: NEGATIVE
Protein, ur: 30 mg/dL — AB
Specific Gravity, Urine: 1.027 (ref 1.005–1.030)
pH: 6 (ref 5.0–8.0)

## 2022-08-02 LAB — RAPID URINE DRUG SCREEN, HOSP PERFORMED
Amphetamines: NOT DETECTED
Barbiturates: NOT DETECTED
Benzodiazepines: NOT DETECTED
Cocaine: NOT DETECTED
Opiates: NOT DETECTED
Tetrahydrocannabinol: NOT DETECTED

## 2022-08-02 LAB — CBC WITH DIFFERENTIAL/PLATELET
Abs Immature Granulocytes: 0.05 10*3/uL (ref 0.00–0.07)
Basophils Absolute: 0 10*3/uL (ref 0.0–0.1)
Basophils Relative: 0 %
Eosinophils Absolute: 0.1 10*3/uL (ref 0.0–0.5)
Eosinophils Relative: 1 %
HCT: 40 % (ref 36.0–46.0)
Hemoglobin: 12.9 g/dL (ref 12.0–15.0)
Immature Granulocytes: 0 %
Lymphocytes Relative: 15 %
Lymphs Abs: 1.9 10*3/uL (ref 0.7–4.0)
MCH: 27.7 pg (ref 26.0–34.0)
MCHC: 32.3 g/dL (ref 30.0–36.0)
MCV: 86 fL (ref 80.0–100.0)
Monocytes Absolute: 0.9 10*3/uL (ref 0.1–1.0)
Monocytes Relative: 7 %
Neutro Abs: 9.9 10*3/uL — ABNORMAL HIGH (ref 1.7–7.7)
Neutrophils Relative %: 77 %
Platelets: 312 10*3/uL (ref 150–400)
RBC: 4.65 MIL/uL (ref 3.87–5.11)
RDW: 13.2 % (ref 11.5–15.5)
WBC: 12.9 10*3/uL — ABNORMAL HIGH (ref 4.0–10.5)
nRBC: 0 % (ref 0.0–0.2)

## 2022-08-02 LAB — TSH: TSH: 0.031 u[IU]/mL — ABNORMAL LOW (ref 0.350–4.500)

## 2022-08-02 LAB — SARS CORONAVIRUS 2 BY RT PCR: SARS Coronavirus 2 by RT PCR: NEGATIVE

## 2022-08-02 NOTE — ED Provider Notes (Signed)
Patient presents with increasing anxiety and what appears to be episodes of panic attacks.  The patient also has incidentally suffered with thyroid dysfunction and is currently on 138 mcg of levothyroxine according to her report.  Interestingly her TSH is virtually undetectable suggesting a hyperthyroid state which can also cause her symptoms.  Her vital signs were reviewed and she does not appear to have any signs of thyroid storm, no tachycardia fever or altered mental status.  She will be cautioned against using her levothyroxine until she follows up with primary care physician.  Currently in process of being referred to endocrinology.  Patient and mother agreeable to the plan, stable for discharge   Noemi Chapel, MD 08/03/22 2235

## 2022-08-02 NOTE — ED Triage Notes (Signed)
Pt to the ED with complaints of a panic attack after increasing her dose of zoloft on Saturday. Pt complains of shortness of breath, chest pain, vomiting and tremors.

## 2022-08-02 NOTE — ED Provider Notes (Signed)
[facility] EMERGENCY DEPARTMENT Provider Note   CSN: [id number] Arrival date & time: 08/02/22  1159     History  Chief Complaint  Patient presents with   Anxiety    Linda [name] is a 31 y.o. female.  Patient complains of shaking.  Patient reviewed reports feeling like she is having an anxiety attack.  Patient's mother is concerned that patient has serotonin syndrome as her antidepressant was recently doubled.  Patient reports she takes Zoloft.  patient has a past medical history of being hypothyroid.  Patient was taking Ativan till last month and this was stopped by her physician.  Patient denies any fever or chills she denies any cough or congestion.  The history is provided by the patient. No language interpreter was used.  Anxiety This is a new problem. The current episode started yesterday. The problem occurs constantly. The problem has been gradually worsening. Associated symptoms include headaches. Pertinent negatives include no chest pain. Nothing aggravates the symptoms. Nothing relieves the symptoms. She has tried nothing for the symptoms.       Home Medications Prior to Admission medications   Medication Sig Start Date End Date Taking? Authorizing Provider  albuterol (VENTOLIN HFA) 108 (90 Base) MCG/ACT inhaler Inhale 1-2 puffs into the lungs every 6 (six) hours as needed for wheezing or shortness of breath. 09/13/21   Jaynee Eagles, PA-C  DULoxetine (CYMBALTA) 30 MG capsule Take 1 capsule (30 mg total) by mouth 2 (two) times daily. 05/31/22   Dian Situ, MD  gabapentin (NEURONTIN) 100 MG capsule Take 1 capsule (100 mg total) by mouth 3 (three) times daily. 05/31/22   Dian Situ, MD  levothyroxine (SYNTHROID) 150 MCG tablet Take 1 tablet (150 mcg total) by mouth daily at 6 (six) AM. 06/01/22   Dian Situ, MD  LORazepam (ATIVAN) 0.5 MG tablet Take 1 tablet (0.5 mg total) by mouth every 6 (six) hours as needed for anxiety. 05/31/22   Dian Situ, MD  nicotine  (NICODERM CQ - DOSED IN MG/24 HR) 7 mg/24hr patch Place 1 patch (7 mg total) onto the skin daily. 05/31/22   Dian Situ, MD  omeprazole (PRILOSEC) 40 MG capsule Take 40 mg by mouth daily. 03/08/22   [provider]  QUEtiapine (SEROQUEL) 400 MG tablet Take 1 tablet (400 mg total) by mouth at bedtime. 05/31/22   Dian Situ, MD      Allergies    Banana, Lorazepam, Buspar [buspirone], Ciprofloxacin, Levaquin [levofloxacin], and Sulfa antibiotics    Review of Systems   Review of Systems  Cardiovascular:  Negative for chest pain.  Neurological:  Positive for headaches.  All other systems reviewed and are negative.   Physical Exam Updated Vital Signs BP 113/64 (BP Location: Left Arm)   Pulse (!) 58   Temp 98 F (36.7 C) (Oral)   Resp 16   Ht 5\' 5"  (1.651 m)   LMP 07/30/2022   SpO2 100%   BMI 33.98 kg/m  Physical Exam Vitals and nursing note reviewed.  Constitutional:      Appearance: She is well-developed.  HENT:     Head: Normocephalic.     Mouth/Throat:     Mouth: Mucous membranes are moist.  Eyes:     Extraocular Movements: Extraocular movements intact.     Pupils: Pupils are equal, round, and reactive to light.  Cardiovascular:     Rate and Rhythm: Normal rate and regular rhythm.     Pulses: Normal pulses.  Pulmonary:     Effort:  Pulmonary effort is normal.  Abdominal:     General: Abdomen is flat. There is no distension.  Musculoskeletal:        General: Normal range of motion.     Cervical back: Normal range of motion.  Skin:    General: Skin is warm.  Neurological:     General: No focal deficit present.     Mental Status: She is alert and oriented to person, place, and time.  Psychiatric:        Mood and Affect: Mood normal.     ED Results / Procedures / Treatments   Labs (all labs ordered are listed, but only abnormal results are displayed) Labs Reviewed  CBC WITH DIFFERENTIAL/PLATELET - Abnormal; Notable for the following components:       Result Value   WBC 12.9 (*)    Neutro Abs 9.9 (*)    All other components within normal limits  COMPREHENSIVE METABOLIC PANEL - Abnormal; Notable for the following components:   Alkaline Phosphatase 157 (*)    All other components within normal limits  URINALYSIS, ROUTINE W REFLEX MICROSCOPIC - Abnormal; Notable for the following components:   Color, Urine STRAW (*)    APPearance CLOUDY (*)    Ketones, ur 5 (*)    Protein, ur 30 (*)    Leukocytes,Ua MODERATE (*)    Bacteria, UA FEW (*)    All other components within normal limits  TSH - Abnormal; Notable for the following components:   TSH 0.031 (*)    All other components within normal limits  SARS CORONAVIRUS 2 BY RT PCR  ETHANOL  RAPID URINE DRUG SCREEN, HOSP PERFORMED    EKG None  Radiology No results found.  Procedures Procedures    Medications Ordered in ED Medications - No data to display  ED Course/ Medical Decision Making/ A&P                           Medical Decision Making Amount and/or Complexity of Data Reviewed Independent Historian: parent    Details: Patient is here with her mother patient's mother is concerned that patient may have some type of hormonal imbalance.  She is concerned that patient could have serotonin syndrome or some type of electrolyte abnormality. External Data Reviewed: notes.    Details: Primary care notes reviewed Labs: ordered. Decision-making details documented in ED Course.    Details: Labs ordered reviewed and interpreted patient has a white blood cell count of 12.9.  Patient has a TSH which is 0.031  Risk Risk Details: Medical decision making patient does not have any electrolyte abnormality she does have a low TSH.  I doubt serotonin syndrome.  I advised follow-up with primary care for evaluation of thyroid disease patient is advised to hold her Synthroid.  Sabra Heck is in to see and evaluate patient and counseled patient on follow-up           Final Clinical  Impression(s) / ED Diagnoses Final diagnoses:  Palpitations  Anxiety    Rx / DC Orders ED Discharge Orders     None      An After Visit Summary was printed and given to the patient.    Sidney Ace 08/02/22 2307    Noemi Chapel, MD 08/03/22 [phone]

## 2022-08-02 NOTE — Discharge Instructions (Addendum)
See your Physician for recheck in 1 week.  Hold synthroid (levothyroxine) until  rechecked by your MD.

## 2022-08-25 ENCOUNTER — Telehealth: Payer: Medicaid Other | Admitting: Family

## 2022-08-25 DIAGNOSIS — B372 Candidiasis of skin and nail: Secondary | ICD-10-CM

## 2022-08-25 MED ORDER — NYSTATIN 100000 UNIT/GM EX CREA: 1.0000 | TOPICAL_CREAM | Freq: Two times a day (BID) | CUTANEOUS | 1 refills | Status: AC

## 2022-08-25 MED ORDER — NYSTATIN 100000 UNIT/GM EX POWD: 1.0000 | Freq: Three times a day (TID) | CUTANEOUS | 0 refills | Status: AC

## 2022-08-25 NOTE — Progress Notes (Signed)
E Visit for Rash  We are sorry that you are not feeling well. Here is how we plan to help!   Based upon your presentation it appears you have a fungal infection.  I have prescribed: and Nystatin cream apply to the affected area twice daily and nystatin powder.    HOME CARE:  Take cool showers and avoid direct sunlight. Apply cool compress or wet dressings. Take a bath in an oatmeal bath.  Sprinkle content of one Aveeno packet under running faucet with comfortably warm water.  Bathe for 15-20 minutes, 1-2 times daily.  Pat dry with a towel. Do not rub the rash. Use hydrocortisone cream. Take an antihistamine like Benadryl for widespread rashes that itch.  The adult dose of Benadryl is 25-50 mg by mouth 4 times daily. Caution:  This type of medication may cause sleepiness.  Do not drink alcohol, drive, or operate dangerous machinery while taking antihistamines.  Do not take these medications if you have prostate enlargement.  Read package instructions thoroughly on all medications that you take.  GET HELP RIGHT AWAY IF:  Symptoms don't go away after treatment. Severe itching that persists. If you rash spreads or swells. If you rash begins to smell. If it blisters and opens or develops a yellow-brown crust. You develop a fever. You have a sore throat. You become short of breath.  MAKE SURE YOU:  Understand these instructions. Will watch your condition. Will get help right away if you are not doing well or get worse.  Thank you for choosing an e-visit.  Your e-visit answers were reviewed by a board certified advanced clinical practitioner to complete your personal care plan. Depending upon the condition, your plan could have included both over the counter or prescription medications.  Please review your pharmacy choice. Make sure the pharmacy is open so you can pick up prescription now. If there is a problem, you may contact your provider through MyChart messaging and have the  prescription routed to another pharmacy.  Your safety is important to us. If you have drug allergies check your prescription carefully.   For the next 24 hours you can use MyChart to ask questions about today's visit, request a non-urgent call back, or ask for a work or school excuse. You will get an email in the next two days asking about your experience. I hope that your e-visit has been valuable and will speed your recovery.  Approximately 5 minutes was spent documenting and reviewing patient's chart.    

## 2022-09-07 ENCOUNTER — Ambulatory Visit (HOSPITAL_COMMUNITY): Payer: Medicaid Other | Admitting: Psychiatry

## 2022-10-10 ENCOUNTER — Telehealth: Payer: Medicaid Other | Admitting: Nurse Practitioner

## 2022-10-10 DIAGNOSIS — R399 Unspecified symptoms and signs involving the genitourinary system: Secondary | ICD-10-CM

## 2022-10-10 MED ORDER — NITROFURANTOIN MONOHYD MACRO 100 MG PO CAPS: 100.0000 mg | ORAL_CAPSULE | Freq: Two times a day (BID) | ORAL | 0 refills | Status: AC

## 2022-10-10 NOTE — Progress Notes (Signed)

## 2022-10-10 NOTE — Progress Notes (Signed)
I have spent 5 minutes in review of e-visit questionnaire, review and updating patient chart, medical decision making and response to patient.  ° °Camaria Gerald W Nehemiah Mcfarren, NP ° °  °

## 2022-10-23 IMAGING — DX DG LUMBAR SPINE COMPLETE 4+V
5 series · 6 of 6 positions shown · non-contrast
Comparison: Lumbar spine radiographs 01/02/2008

CLINICAL DATA: Pt with lower back since fall that occurred two days
ago. Pt states she fell down a flight of stairs. Pt says pain is
worse when she tries to lay straight.

EXAM:
LUMBAR SPINE - COMPLETE 4+ VIEW

[l-spine ap]
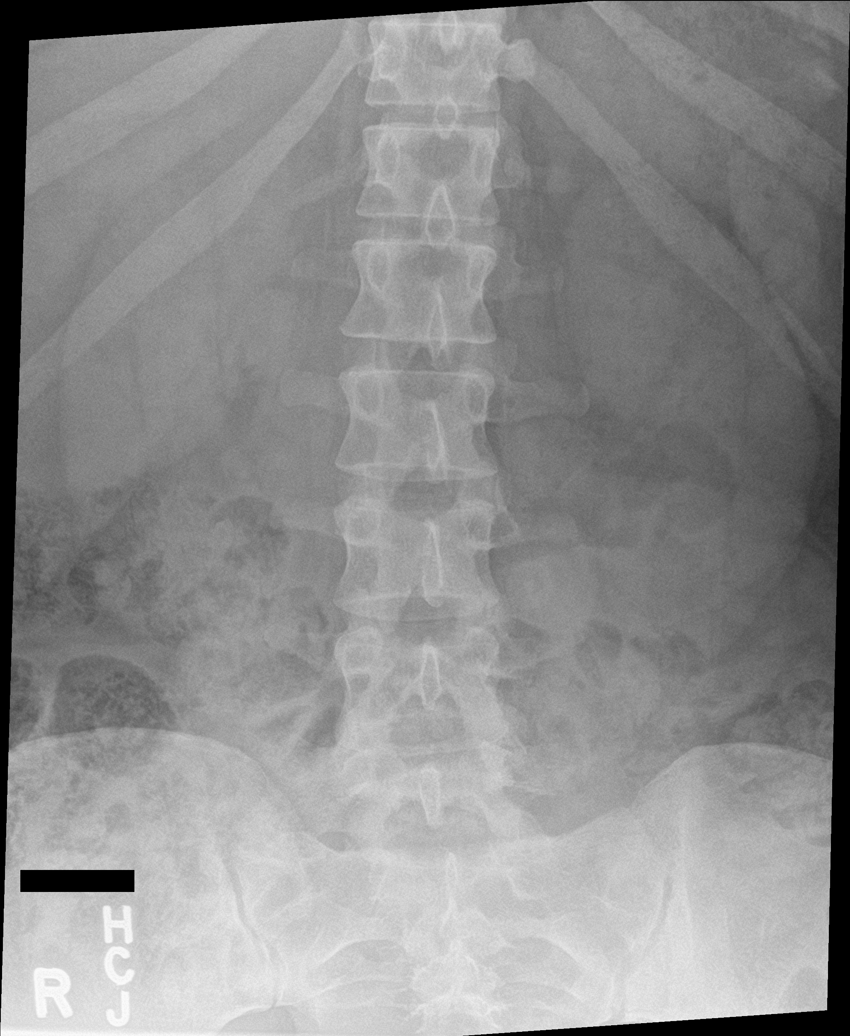

[l-spine obl (1 of 2)]
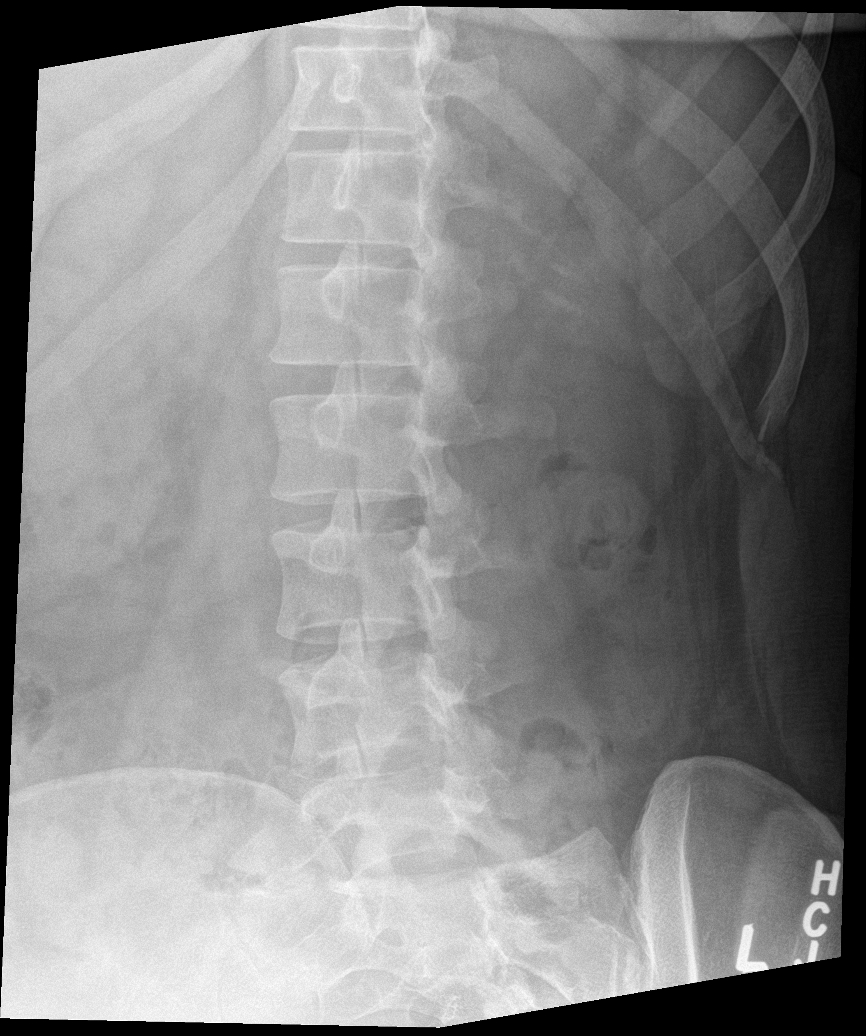

[l-spine obl (2 of 2)]
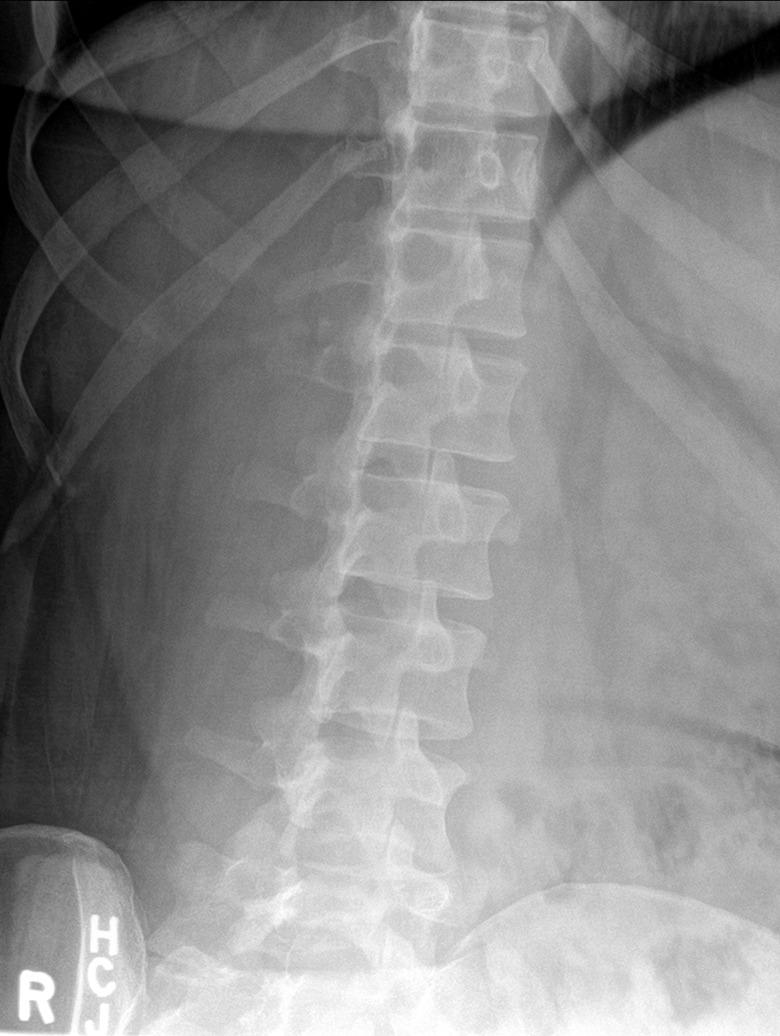

[Series 4: l-spine lat · 0.13mm/px · 2 of 2 slices shown]
[im 1/2]
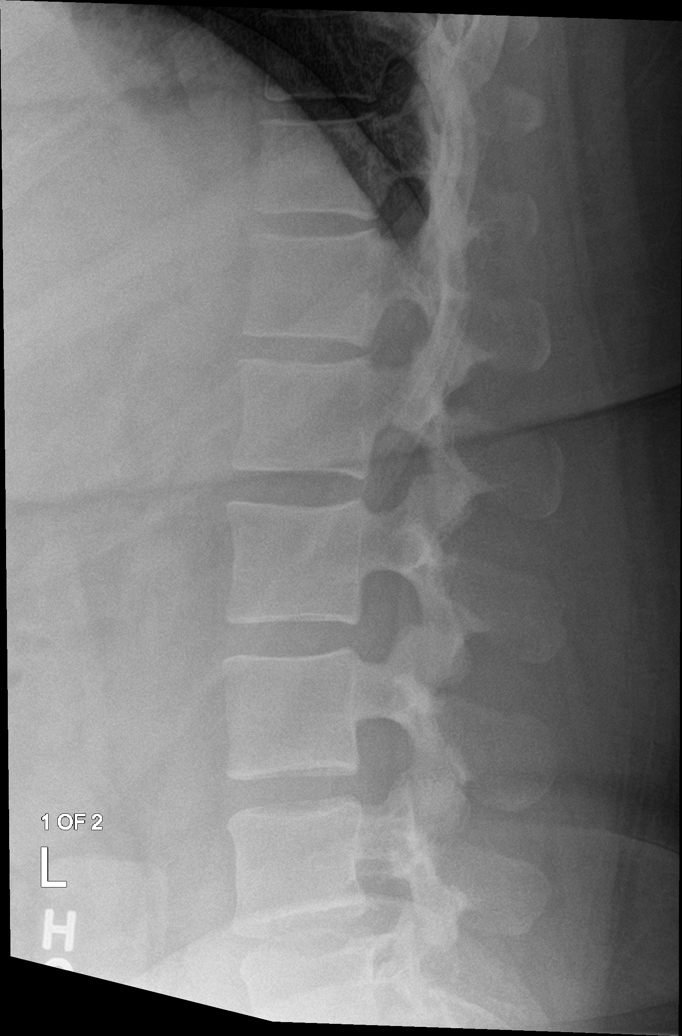
[im 2/2]
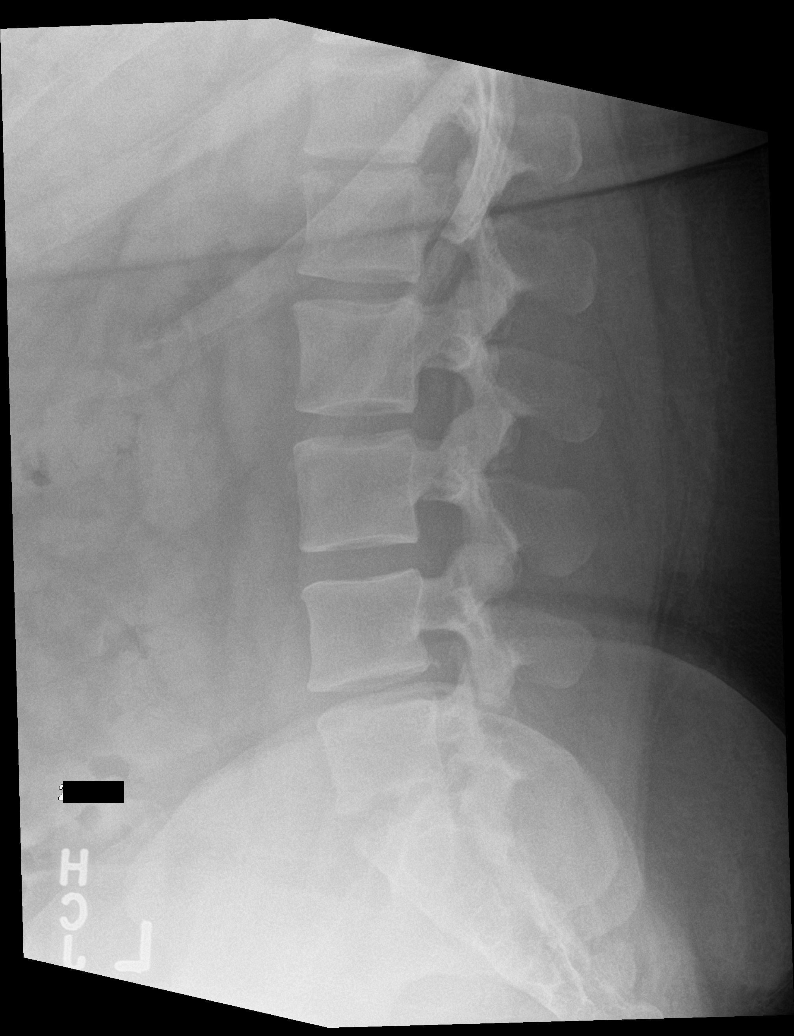

[l-spine spot]
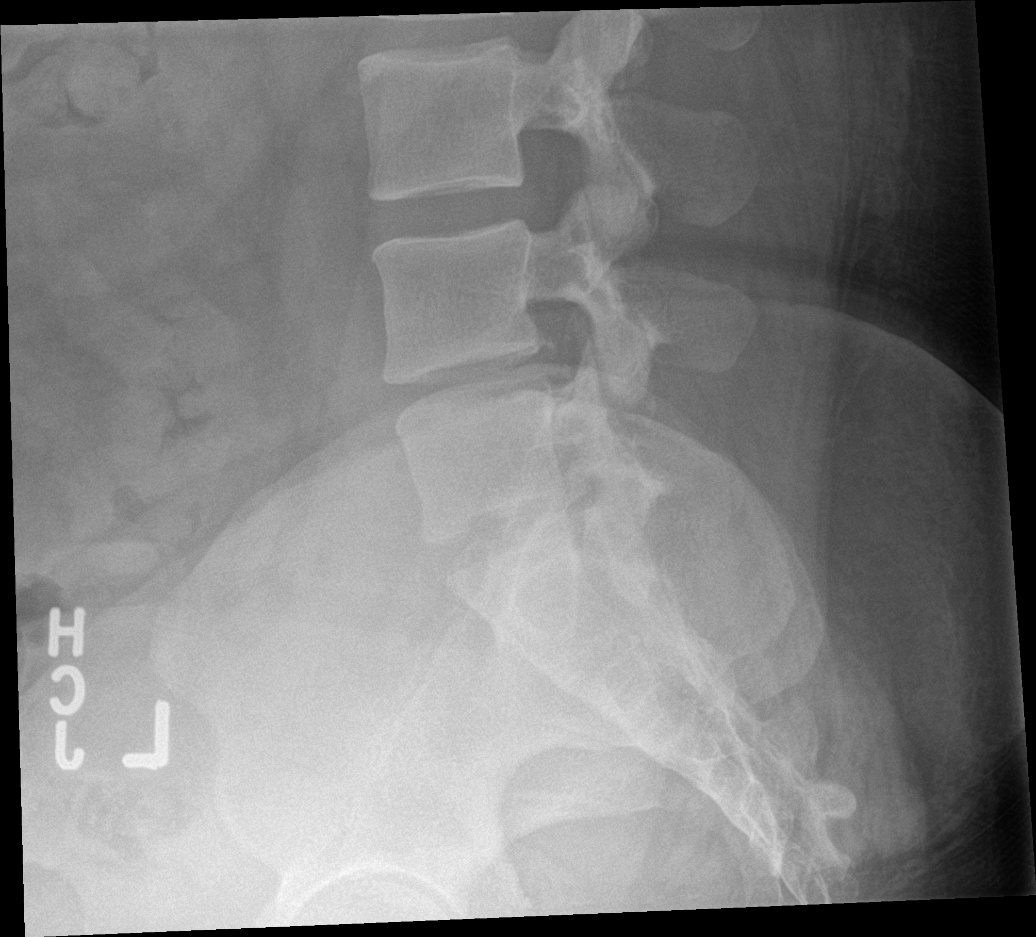

[6 of 6 positions shown; findings below may reference images not displayed]

FINDINGS: Normal alignment. Vertebral body heights and intervertebral disc
spaces are maintained. No focal bone lesion. No significant
degenerative change. SI joints are open and symmetric.
Nonobstructive bowel gas pattern.
IMPRESSION: Negative lumbar spine radiographs.

If symptoms persist or there is high clinical concern for
radiographically occult injury, consider cross-sectional imaging for
further evaluation.

## 2022-12-03 ENCOUNTER — Telehealth: Payer: Medicaid Other | Admitting: Physician Assistant

## 2022-12-03 DIAGNOSIS — T3695XA Adverse effect of unspecified systemic antibiotic, initial encounter: Secondary | ICD-10-CM | POA: Diagnosis not present

## 2022-12-03 DIAGNOSIS — B379 Candidiasis, unspecified: Secondary | ICD-10-CM | POA: Diagnosis not present

## 2022-12-03 DIAGNOSIS — R3989 Other symptoms and signs involving the genitourinary system: Secondary | ICD-10-CM

## 2022-12-03 MED ORDER — FLUCONAZOLE 150 MG PO TABS: 150.0000 mg | ORAL_TABLET | ORAL | 0 refills | Status: DC | PRN

## 2022-12-03 MED ORDER — CEPHALEXIN 500 MG PO CAPS: 500.0000 mg | ORAL_CAPSULE | Freq: Two times a day (BID) | ORAL | 0 refills | Status: DC

## 2022-12-03 NOTE — Progress Notes (Signed)
E-Visit for Urinary Problems  We are sorry that you are not feeling well.  Here is how we plan to help!  Based on what you shared with me it looks like you most likely have a simple urinary tract infection.  A UTI (Urinary Tract Infection) is a bacterial infection of the bladder.  Most cases of urinary tract infections are simple to treat but a key part of your care is to encourage you to drink plenty of fluids and watch your symptoms carefully.  I have prescribed Keflex 500 mg twice a day for 7 days.  Your symptoms should gradually improve. Call us if the burning in your urine worsens, you develop worsening fever, back pain or pelvic pain or if your symptoms do not resolve after completing the antibiotic.  Will treat for UTI first, if not improving, or symptoms change, please follow up.  Urinary tract infections can be prevented by drinking plenty of water to keep your body hydrated.  Also be sure when you wipe, wipe from front to back and don't hold it in!  If possible, empty your bladder every 4 hours.  HOME CARE Drink plenty of fluids Compete the full course of the antibiotics even if the symptoms resolve Remember, when you need to go.go. Holding in your urine can increase the likelihood of getting a UTI! GET HELP RIGHT AWAY IF: You cannot urinate You get a high fever Worsening back pain occurs You see blood in your urine You feel sick to your stomach or throw up You feel like you are going to pass out  MAKE SURE YOU  Understand these instructions. Will watch your condition. Will get help right away if you are not doing well or get worse.   Thank you for choosing an e-visit.  Your e-visit answers were reviewed by a board certified advanced clinical practitioner to complete your personal care plan. Depending upon the condition, your plan could have included both over the counter or prescription medications.  Please review your pharmacy choice. Make sure the pharmacy is open  so you can pick up prescription now. If there is a problem, you may contact your provider through [employer] and have the prescription routed to another pharmacy.  Your safety is important to [place]. If you have drug allergies check your prescription carefully.   For the next 24 hours you can use MyChart to ask questions about today's visit, request a non-urgent call back, or ask for a work or school excuse. You will get an email in the next two days asking about your experience. I hope that your e-visit has been valuable and will speed your recovery.  I have spent 5 minutes in review of e-visit questionnaire, review and updating patient chart, medical decision making and response to patient.   Mar Daring, PA-C

## 2023-01-02 ENCOUNTER — Telehealth: Payer: Medicaid Other | Admitting: Family

## 2023-01-02 DIAGNOSIS — J069 Acute upper respiratory infection, unspecified: Secondary | ICD-10-CM | POA: Diagnosis not present

## 2023-01-02 MED ORDER — PREDNISONE 10 MG (21) PO TBPK: ORAL_TABLET | ORAL | 0 refills | Status: DC

## 2023-01-02 MED ORDER — FLUTICASONE PROPIONATE 50 MCG/ACT NA SUSP: 2.0000 | Freq: Every day | NASAL | 6 refills | Status: DC

## 2023-01-02 MED ORDER — BENZONATATE 100 MG PO CAPS: 100.0000 mg | ORAL_CAPSULE | Freq: Three times a day (TID) | ORAL | 0 refills | Status: DC | PRN

## 2023-01-02 NOTE — Progress Notes (Signed)

## 2023-01-06 ENCOUNTER — Telehealth: Payer: Medicaid Other | Admitting: Physician Assistant

## 2023-01-06 DIAGNOSIS — B9689 Other specified bacterial agents as the cause of diseases classified elsewhere: Secondary | ICD-10-CM

## 2023-01-06 MED ORDER — DOXYCYCLINE HYCLATE 100 MG PO TABS: 100.0000 mg | ORAL_TABLET | Freq: Two times a day (BID) | ORAL | 0 refills | Status: DC

## 2023-01-06 NOTE — Progress Notes (Signed)

## 2023-01-06 NOTE — Progress Notes (Signed)
I have spent 5 minutes in review of e-visit questionnaire, review and updating patient chart, medical decision making and response to patient.   Aydyn Testerman Cody Tres Grzywacz, PA-C    

## 2023-02-03 ENCOUNTER — Other Ambulatory Visit (HOSPITAL_COMMUNITY)
Admission: RE | Admit: 2023-02-03 | Discharge: 2023-02-03 | Disposition: A | Discharge status: Home / Self Care | Payer: Medicaid Other | Source: Ambulatory Visit | Attending: Obstetrics & Gynecology | Admitting: Obstetrics & Gynecology

## 2023-02-03 ENCOUNTER — Ambulatory Visit: Payer: Medicaid Other | Admitting: Obstetrics & Gynecology

## 2023-02-03 ENCOUNTER — Encounter: Payer: Self-pay | Admitting: Obstetrics & Gynecology

## 2023-02-03 VITALS — BP 97/68 | HR 76 | Ht 65.5 in | Wt 195.0 lb

## 2023-02-03 DIAGNOSIS — Z01419 Encounter for gynecological examination (general) (routine) without abnormal findings: Secondary | ICD-10-CM | POA: Insufficient documentation

## 2023-02-03 MED ORDER — CITALOPRAM HYDROBROMIDE 20 MG PO TABS: ORAL_TABLET | ORAL | 3 refills | Status: DC

## 2023-02-03 NOTE — Progress Notes (Signed)
Subjective:     Linda [name] is a 32 y.o. female here for a routine exam.  Patient's last menstrual period was 01/15/2023 (exact date). G1P0101 Birth Control Method:  none Menstrual Calendar(currently): regular  Current complaints: PMDD.   Current acute medical issues:  nne   Recent Gynecologic History Patient's last menstrual period was 01/15/2023 (exact date). Last Pap: 2021,  normal Last mammogram: na,    Past Medical History:  Diagnosis Date   ADD (attention deficit disorder)    Asthma    Fibromyalgia    GAD (generalized anxiety disorder)    GERD (gastroesophageal reflux disease)    Hashimoto's thyroiditis    Heart murmur    IBS (irritable bowel syndrome)    Interstitial cystitis    postpartum depression     Past Surgical History:  Procedure Laterality Date   APPENDECTOMY     CESAREAN SECTION N/A 03/20/2013   Procedure: CESAREAN SECTION;  Surgeon: Logan Bores, MD;  Location: [facility];  Service: Obstetrics;  Laterality: N/A;   CESAREAN SECTION     gastric     gasric bypass   LAPAROSCOPIC APPENDECTOMY N/A 10/16/2019   Procedure: APPENDECTOMY LAPAROSCOPIC;  Surgeon: Aviva Signs, MD;  Location: [facility];  Service: General;  Laterality: N/A;   MYRINGOTOMY WITH TUBE PLACEMENT     NO PAST SURGERIES     TYMPANOSTOMY TUBE PLACEMENT     UPPER GI ENDOSCOPY      OB History     Gravida  1   Para  1   Term  0   Preterm  1   AB  0   Living  1      SAB  0   IAB  0   Ectopic  0   Multiple      Live Births  1           Social History   Socioeconomic History   Marital status: Married    Spouse name: Not on file   Number of children: Not on file   Years of education: Not on file   Highest education level: Not on file  Occupational History   Not on file  Tobacco Use   Smoking status: Every Day    Packs/day: 1    Types: Cigarettes    Start date: 03/15/2021   Smokeless tobacco: Never  Vaping Use   Vaping Use: Former  Substance and  Sexual Activity   Alcohol use: Not Currently    Comment: occasionally   Drug use: Yes    Types: Benzodiazepines    Comment: 1 mg of Xanax PRN, was prescribed 3-4 years ago, last used on Thursday   Sexual activity: Yes    Partners: Male    Birth control/protection: None  Other Topics Concern   Not on file  Social History Narrative   Married with a 49 y.o. son.    Social Determinants of Health   Financial Resource Strain: Not on file  Food Insecurity: Not on file  Transportation Needs: Not on file  Physical Activity: Not on file  Stress: Not on file  Social Connections: Not on file    Family History  Problem Relation Age of Onset   Stroke Paternal Grandfather    Dementia Paternal Grandmother    Depression Maternal Grandmother    COPD Maternal Grandfather    Autoimmune disease Father    Depression Mother    OCD Mother    Anxiety disorder Mother    Rheum arthritis Mother  Fibromyalgia Mother    Anxiety disorder Brother    Healthy Brother    ADD / ADHD Son    Healthy Son    Drug abuse Maternal Uncle    Depression Maternal Uncle    Anxiety disorder Maternal Uncle    Cancer Paternal Uncle    Breast cancer Maternal Aunt      Current Outpatient Medications:    albuterol (VENTOLIN HFA) 108 (90 Base) MCG/ACT inhaler, Inhale 1-2 puffs into the lungs every 6 (six) hours as needed for wheezing or shortness of breath., Disp: 18 g, Rfl: 0   fluticasone (FLONASE) 50 MCG/ACT nasal spray, Place 2 sprays into both nostrils daily., Disp: 16 g, Rfl: 6   lamoTRIgine (LAMICTAL) 100 MG tablet, Take 100 mg by mouth 2 (two) times daily., Disp: , Rfl:    levothyroxine (SYNTHROID) 150 MCG tablet, Take 1 tablet (150 mcg total) by mouth daily at 6 (six) AM., Disp: 30 tablet, Rfl: 0   nystatin (MYCOSTATIN/NYSTOP) powder, Apply 1 Application topically 3 (three) times daily., Disp: 15 g, Rfl: 0   nystatin cream (MYCOSTATIN), Apply 1 Application topically 2 (two) times daily., Disp: 30 g, Rfl:  1   omeprazole (PRILOSEC) 40 MG capsule, Take 40 mg by mouth daily., Disp: , Rfl:    citalopram (CELEXA) 20 MG tablet, Take as directed, 20 mg per day 1st 2 weeks of cycle, then 40 mg a day until starts bleeding, Disp: 45 tablet, Rfl: 3  Review of Systems  Review of Systems  Constitutional: Negative for fever, chills, weight loss, malaise/fatigue and diaphoresis.  HENT: Negative for hearing loss, ear pain, nosebleeds, congestion, sore throat, neck pain, tinnitus and ear discharge.   Eyes: Negative for blurred vision, double vision, photophobia, pain, discharge and redness.  Respiratory: Negative for cough, hemoptysis, sputum production, shortness of breath, wheezing and stridor.   Cardiovascular: Negative for chest pain, palpitations, orthopnea, claudication, leg swelling and PND.  Gastrointestinal: negative for abdominal pain. Negative for heartburn, nausea, vomiting, diarrhea, constipation, blood in stool and melena.  Genitourinary: Negative for dysuria, urgency, frequency, hematuria and flank pain.  Musculoskeletal: Negative for myalgias, back pain, joint pain and falls.  Skin: Negative for itching and rash.  Neurological: Negative for dizziness, tingling, tremors, sensory change, speech change, focal weakness, seizures, loss of consciousness, weakness and headaches.  Endo/Heme/Allergies: Negative for environmental allergies and polydipsia. Does not bruise/bleed easily.  Psychiatric/Behavioral: Negative for depression, suicidal ideas, hallucinations, memory loss and substance abuse. The patient is not nervous/anxious and does not have insomnia.        Objective:  Blood pressure 97/68, pulse 76, height 5' 5.5" (1.664 m), weight 195 lb (88.5 kg), last menstrual period 01/15/2023.   Physical Exam  Vitals reviewed. Constitutional: She is oriented to person, place, and time. She appears well-developed and well-nourished.  HENT:  Head: Normocephalic and atraumatic.        Right Ear:  External ear normal.  Left Ear: External ear normal.  Nose: Nose normal.  Mouth/Throat: Oropharynx is clear and moist.  Eyes: Conjunctivae and EOM are normal. Pupils are equal, round, and reactive to light. Right eye exhibits no discharge. Left eye exhibits no discharge. No scleral icterus.  Neck: Normal range of motion. Neck supple. No tracheal deviation present. No thyromegaly present.  Cardiovascular: Normal rate, regular rhythm, normal heart sounds and intact distal pulses.  Exam reveals no gallop and no friction rub.   No murmur heard. Respiratory: Effort normal and breath sounds normal. No respiratory distress. She has  no wheezes. She has no rales. She exhibits no tenderness.  GI: Soft. Bowel sounds are normal. She exhibits no distension and no mass. There is no tenderness. There is no rebound and no guarding.  Genitourinary:  Breasts no masses skin changes or nipple changes bilaterally      Vulva is normal without lesions Vagina is pink moist without discharge Cervix normal in appearance and pap is done Uterus is normal size shape and contour Adnexa is negative with normal sized ovaries   Musculoskeletal: Normal range of motion. She exhibits no edema and no tenderness.  Neurological: She is alert and oriented to person, place, and time. She has normal reflexes. She displays normal reflexes. No cranial nerve deficit. She exhibits normal muscle tone. Coordination normal.  Skin: Skin is warm and dry. No rash noted. No erythema. No pallor.  Psychiatric: She has a normal mood and affect. Her behavior is normal. Judgment and thought content normal.       Medications Ordered at today's visit: Meds ordered this encounter  Medications   citalopram (CELEXA) 20 MG tablet    Sig: Take as directed, 20 mg per day 1st 2 weeks of cycle, then 40 mg a day until starts bleeding    Dispense:  45 tablet    Refill:  3    Other orders placed at today's visit: No orders of the defined types were  placed in this encounter.     Assessment:    Normal Gyn exam.   PMDD Plan:    Sheliah Hatch pre menstrually to 40 mg     Return if symptoms worsen or fail to improve.

## 2023-02-09 ENCOUNTER — Encounter: Payer: Self-pay | Admitting: Obstetrics & Gynecology

## 2023-02-09 LAB — CYTOLOGY - PAP
Chlamydia: NEGATIVE
Comment: NEGATIVE
Comment: NEGATIVE
Comment: NORMAL
Diagnosis: NEGATIVE
High risk HPV: NEGATIVE
Neisseria Gonorrhea: NEGATIVE

## 2023-04-13 ENCOUNTER — Ambulatory Visit
Admission: RE | Admit: 2023-04-13 | Discharge: 2023-04-13 | Disposition: A | Discharge status: Home / Self Care | Payer: Medicaid Other | Source: Ambulatory Visit | Attending: Nurse Practitioner | Admitting: Nurse Practitioner

## 2023-04-13 VITALS — BP 103/68 | HR 82 | Temp 98.0°F | Resp 18

## 2023-04-13 DIAGNOSIS — L03213 Periorbital cellulitis: Secondary | ICD-10-CM

## 2023-04-13 MED ORDER — CEPHALEXIN 500 MG PO CAPS: 500.0000 mg | ORAL_CAPSULE | Freq: Four times a day (QID) | ORAL | 0 refills | Status: AC

## 2023-04-13 NOTE — Discharge Instructions (Addendum)
Take medication as prescribed.  Continue the eyedrops that you were previously prescribed. Continue Benadryl at bedtime to help with itching.  Recommend Zyrtec or Cetirizine for daytime. May take over-the-counter Tylenol or ibuprofen as needed for pain, fever, general discomfort. Cool compresses to the eyes to help with inflammation and swelling. Do not rub, or manipulate the eyes while symptoms of this. Do not recommend reapplying eyelashes in the future. If symptoms do not improve with this treatment, recommend physician, or your eye doctor evaluation. Follow-up as needed.

## 2023-04-13 NOTE — ED Provider Notes (Signed)
[facility]    CSN: [id number] Arrival date & time: 04/13/23  1604      History   Chief Complaint Chief Complaint  Patient presents with   Eye Problem    Entered by patient   Eye Drainage    HPI Linda [name] is a 32 y.o. female.   The history is provided by the patient.   The patient presents for complaints of bilateral irritation, and burning.  Patient states symptoms started approximately 2 weeks ago after she had false eyelashes applied.  She states the same day, she noticed the eyelash glue on her skin, which she removed.  She states the next day, she woke up the symptoms.  Patient states that she did go to an urgent care, and at that time was prescribed an eyedrop and was advised to take Benadryl as they thought this was an allergic reaction.  Patient states symptoms did begin to improve; however, over the past 24 hours her symptoms started, at which time she did remove the eyelashes.  She reports that morning, her eyes were matted shut. She denies fever, chills, change in vision, decreased vision, headache, nasal congestion, runny nose or cough.   Past Medical History:  Diagnosis Date   ADD (attention deficit disorder)    Asthma    Fibromyalgia    GAD (generalized anxiety disorder)    GERD (gastroesophageal reflux disease)    Hashimoto's thyroiditis    Heart murmur    IBS (irritable bowel syndrome)    Interstitial cystitis    postpartum depression     Patient Active Problem List   Diagnosis Date Noted   Fibromyalgia 05/24/2022   Hashimoto's thyroiditis 05/24/2022   Pure hypercholesterolemia 05/24/2022   Bipolar 2 disorder, major depressive episode (HCC) 05/23/2022   Hidradenitis suppurativa of left axilla 07/10/2020   Hepatomegaly 03/16/2020   Hepatic steatosis 03/16/2020   S/P laparoscopic appendectomy 10/16/2019   Acute appendicitis, uncomplicated    Ovarian cyst, right    Mood disorder in conditions classified elsewhere 04/06/2018    Generalized anxiety disorder 04/06/2018   Hypothyroidism 06/21/2014   Interstitial cystitis 06/14/2014   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    Past Surgical History:  Procedure Laterality Date   APPENDECTOMY     CESAREAN SECTION N/A 03/20/2013   Procedure: CESAREAN SECTION;  Surgeon: [name], MD;  Location: [facility];  Service: Obstetrics;  Laterality: N/A;   CESAREAN SECTION     gastric     gasric bypass   LAPAROSCOPIC APPENDECTOMY N/A 10/16/2019   Procedure: APPENDECTOMY LAPAROSCOPIC;  Surgeon: Franky Macho, MD;  Location: [facility];  Service: General;  Laterality: N/A;   MYRINGOTOMY WITH TUBE PLACEMENT     NO PAST SURGERIES     TYMPANOSTOMY TUBE PLACEMENT     UPPER GI ENDOSCOPY      OB History     Gravida  1   Para  1   Term  0   Preterm  1   AB  0   Living  1      SAB  0   IAB  0   Ectopic  0   Multiple      Live Births  1  Home Medications    Prior to Admission medications   Medication Sig Start Date End Date Taking? Authorizing Provider  albuterol (VENTOLIN HFA) 108 (90 Base) MCG/ACT inhaler Inhale 1-2 puffs into the lungs every 6 (six) hours as needed for wheezing or shortness of breath. 09/13/21  Yes Wallis Bamberg, PA-C  cephALEXin (KEFLEX) 500 MG capsule Take 1 capsule (500 mg total) by mouth 4 (four) times daily for 5 days. 04/13/23 04/18/23 Yes Oviya Ammar-Warren, Sadie Haber, NP  citalopram (CELEXA) 20 MG tablet Take as directed, 20 mg per day 1st 2 weeks of cycle, then 40 mg a day until starts bleeding 02/03/23  Yes Lazaro Arms, MD  fluticasone (FLONASE) 50 MCG/ACT nasal spray Place 2 sprays into both nostrils daily. 01/02/23  Yes Hawks, Christy A, FNP   lamoTRIgine (LAMICTAL) 100 MG tablet Take 100 mg by mouth 2 (two) times daily.   Yes [provider]  levothyroxine (SYNTHROID) 150 MCG tablet Take 1 tablet (150 mcg total) by mouth daily at 6 (six) AM. 06/01/22  Yes Abbott Pao, Nadir, MD  nystatin (MYCOSTATIN/NYSTOP) powder Apply 1 Application topically 3 (three) times daily. 08/25/22   Jannifer Rodney A, FNP  nystatin cream (MYCOSTATIN) Apply 1 Application topically 2 (two) times daily. 08/25/22   Junie Spencer, FNP  omeprazole (PRILOSEC) 40 MG capsule Take 40 mg by mouth daily. 03/08/22   [provider]    Family History Family History  Problem Relation Age of Onset   Stroke Paternal Grandfather    Dementia Paternal Grandmother    Depression Maternal Grandmother    COPD Maternal Grandfather    Autoimmune disease Father    Depression Mother    OCD Mother    Anxiety disorder Mother    Rheum arthritis Mother    Fibromyalgia Mother    Anxiety disorder Brother    Healthy Brother    ADD / ADHD Son    Healthy Son    Drug abuse Maternal Uncle    Depression Maternal Uncle    Anxiety disorder Maternal Uncle    Cancer Paternal Uncle    Breast cancer Maternal Aunt     Social History Social History   Tobacco Use   Smoking status: Every Day    Packs/day: 1    Types: Cigarettes    Start date: 03/15/2021   Smokeless tobacco: Never  Vaping Use   Vaping Use: Former  Substance Use Topics   Alcohol use: Not Currently    Comment: occasionally   Drug use: Yes    Types: Benzodiazepines    Comment: 1 mg of Xanax PRN, was prescribed 3-4 years ago, last used on Thursday     Allergies   Banana, Lorazepam, Buspar [buspirone], Ciprofloxacin, Levaquin [levofloxacin], and Sulfa antibiotics   Review of Systems Review of Systems Per HPI  Physical Exam Triage Vital Signs ED Triage Vitals [04/13/23 1614]  Enc Vitals Group     BP 103/68     Pulse Rate 82     Resp 18     Temp 98 F (36.7 C)     Temp Source Oral      SpO2 97 %     Weight      Height      Head Circumference      Peak Flow      Pain Score 5     Pain Loc      Pain Edu?      Excl. in GC?    No data found.  Updated  Vital Signs BP 103/68 (BP Location: Right Arm)   Pulse 82   Temp 98 F (36.7 C) (Oral)   Resp 18   LMP 04/12/2023 (Exact Date)   SpO2 97%   Visual Acuity Right Eye Distance:   Left Eye Distance:   Bilateral Distance:    Right Eye Near:   Left Eye Near:    Bilateral Near:     Physical Exam Vitals and nursing note reviewed.  Constitutional:      Appearance: Normal appearance. She is not toxic-appearing.  HENT:     Head: Normocephalic.     Right Ear: Tympanic membrane, ear canal and external ear normal.     Left Ear: Tympanic membrane, ear canal and external ear normal.     Nose: Nose normal.     Mouth/Throat:     Mouth: Mucous membranes are moist.  Eyes:     General: Vision grossly intact.        Right eye: No foreign body or discharge.        Left eye: No foreign body or discharge.     Extraocular Movements: Extraocular movements intact.     Right eye: Normal extraocular motion and no nystagmus.     Left eye: Normal extraocular motion and no nystagmus.     Pupils: Pupils are equal, round, and reactive to light.     Comments: Swelling noted to the bilateral periorbital regions which is present.  Dryness and erythema noted to the areas under both eyes.  Cardiovascular:     Rate and Rhythm: Normal rate and regular rhythm.     Pulses: Normal pulses.     Heart sounds: Normal heart sounds.  Pulmonary:     Effort: Pulmonary effort is normal.     Breath sounds: Normal breath sounds.  Musculoskeletal:     Cervical back: Normal range of motion.  Lymphadenopathy:     Cervical: No cervical adenopathy.  Skin:    General: Skin is warm and dry.  Neurological:     General: No focal deficit present.     Mental Status: She is alert and oriented to person, place, and time.  Psychiatric:        Mood and Affect:  Mood normal.        Behavior: Behavior normal.      UC Treatments / Results  Labs (all labs ordered are listed, but only abnormal results are displayed) Labs Reviewed - No data to display  EKG   Radiology No results found.  Procedures Procedures (including critical care time)  Medications Ordered in UC Medications - No data to display  Initial Impression / Assessment and Plan / UC Course  I have reviewed the triage vital signs and the nursing notes.  Pertinent labs & imaging results that were available during my care of the patient were reviewed by me and considered in my medical decision making (see chart for details).   The patient is well-appearing, she is in no acute distress, vital signs are stable.  Will treat patient for periorbital cellulitis with Keflex 500 mg for the next 5 days.  Patient advised to continue the application of the eyedrops previously prescribed.  Patient also advised to continue Benadryl, recommend adding Zyrtec daytime.  Supportive care recommendations were provided and discussed with the patient to include use of cool compresses, avoiding rubbing or manipulating the eyes while symptoms persist, over-the-counter analgesics and pain or discomfort.  Patient strict ER follow-up precautions or follow-up with her eye doctor.  Patient is in agreement with this plan of care and verbalizes understanding.  All questions were answered.  Patient stable for discharge.   Final Clinical Impressions(s) / UC Diagnoses   Final diagnoses:  Cellulitis of periorbital region of both eyes     Discharge Instructions      Take medication as prescribed.  Continue the eyedrops that you were previously prescribed. Continue Benadryl at bedtime to help with itching.  Recommend Zyrtec or Cetirizine for daytime. May take over-the-counter Tylenol or ibuprofen as needed for pain, fever, general discomfort. Cool compresses to the eyes to help with inflammation and  swelling. Do not rub, or manipulate the eyes while symptoms of this. Do not recommend reapplying eyelashes in the future. If symptoms do not improve with this treatment, recommend physician, or your eye doctor evaluation. Follow-up as needed.     ED Prescriptions     Medication Sig Dispense Auth. Provider   cephALEXin (KEFLEX) 500 MG capsule Take 1 capsule (500 mg total) by mouth 4 (four) times daily for 5 days. 20 capsule Ameya Kutz-Warren, Sadie Haber, NP      PDMP not reviewed this encounter.   Abran Cantor, NP 04/13/23 1642

## 2023-04-13 NOTE — ED Triage Notes (Signed)
Pt states her eyes are burning with redness and swelling with yellow discharge. Pt states eyes are matted shut with yellow mucus in the morning. Started a week ago, after she got her eye lashes done, thought it was a reaction to glue from the lashes and was seen last week and told to take benadryl and allergy eye drops to help but has not helped.

## 2023-08-08 ENCOUNTER — Ambulatory Visit: Payer: Medicaid Other | Admitting: Internal Medicine

## 2023-08-29 ENCOUNTER — Telehealth: Payer: Medicaid Other | Admitting: Emergency Medicine

## 2023-08-29 DIAGNOSIS — H60503 Unspecified acute noninfective otitis externa, bilateral: Secondary | ICD-10-CM | POA: Diagnosis not present

## 2023-08-29 MED ORDER — AMOXICILLIN 500 MG PO CAPS: 500.0000 mg | ORAL_CAPSULE | Freq: Two times a day (BID) | ORAL | 0 refills | Status: DC

## 2023-08-29 MED ORDER — CIPROFLOXACIN-DEXAMETHASONE 0.3-0.1 % OT SUSP: 4.0000 [drp] | Freq: Two times a day (BID) | OTIC | 0 refills | Status: DC

## 2023-08-29 NOTE — Progress Notes (Signed)
E Visit for Ear Pain - Swimmer's Ear  We are sorry that you are not feeling well. Here is how we plan to help!  Based on what you have shared with me it looks like you have Swimmer's Ear.  Swimmer's ear is a redness or swelling, irritation, or infection of your outer ear canal. These symptoms usually occur within a few days of swimming. Your ear canal is a tube that goes from the opening of the ear to the eardrum.  When water stays in your ear canal, germs can grow.  This is a painful condition that often happens to children and swimmers of all ages.  It is not contagious and oral antibiotics are not required to treat uncomplicated swimmer's ear.  The usual symptoms include:    Itchiness inside the ear  Redness or a sense of swelling in the ear  Pain when the ear is tugged on when pressure is placed on the ear  Pus draining from the infected ear    I have prescribed: Ciprofloxin 0.3% and dexamethasone 0.1% otic suspension 4 drops in affected ears twice daily for 7 days  And amoxicillin 500mg  twice daily for 10 days.   In certain cases, swimmer's ear may progress to a more serious bacterial infection of the middle or inner ear.  If you have a fever 102 and up and significantly worsening symptoms, this could indicate a more serious infection moving to the middle/inner and needs face to face evaluation in an office by a provider.  Your symptoms should improve over the next 3 days and should resolve in about 7 days.  Be sure to complete ALL of your prescription.  HOME CARE: Wash your hands frequently. If you are prescribed an ear drop, do not place the tip of the bottle on your ear or touch it with your fingers. You can take Acetaminophen 650 mg every 4-6 hours as needed for pain.  If pain is severe or moderate, you can apply a heating pad (set on low) or hot water bottle (wrapped in a towel) to outer ear for 20 minutes.  This will also increase drainage. Avoid ear plugs Do not go swimming  until the symptoms are gone Do not use Q-tips After showers, help the water run out by tilting your head to one side.   GET HELP RIGHT AWAY IF: Fever is over 102.2 degrees. You develop progressive ear pain or hearing loss. Ear symptoms persist longer than 3 days after treatment.  MAKE SURE YOU: Understand these instructions. Will watch your condition. Will get help right away if you are not doing well or get worse.  TO PREVENT SWIMMER'S EAR: Use a bathing cap or custom fitted swim molds to keep your ears dry. Towel off after swimming to dry your ears. Tilt your head or pull your earlobes to allow the water to escape your ear canal. If there is still water in your ears, consider using a hairdryer on the lowest setting.  Thank you for choosing an e-visit.  Your e-visit answers were reviewed by a board certified advanced clinical practitioner to complete your personal care plan. Depending upon the condition, your plan could have included both over the counter or prescription medications.  Please review your pharmacy choice. Make sure the pharmacy is open so you can pick up the prescription now. If there is a problem, you may contact your provider through [employer] and have the prescription routed to another pharmacy.  Your safety is important to [place].  If you have drug allergies check your prescription carefully.   For the next 24 hours you can use MyChart to ask questions about today's visit, request a non-urgent call back, or ask for a work or school excuse. You will get an email with a survey after your eVisit asking about your experience. We would appreciate your feedback. I hope that your e-visit has been valuable and will aid in your recovery.   I have spent 5 minutes in review of e-visit questionnaire, review and updating patient chart, medical decision making and response to patient.   Rica Mast, PhD, FNP-BC

## 2023-09-05 ENCOUNTER — Other Ambulatory Visit: Payer: Self-pay

## 2023-09-05 ENCOUNTER — Ambulatory Visit: Payer: Medicaid Other | Admitting: Internal Medicine

## 2023-09-05 ENCOUNTER — Encounter: Payer: Self-pay | Admitting: Internal Medicine

## 2023-09-05 VITALS — BP 112/70 | HR 84 | Temp 98.8°F | Ht 66.14 in | Wt 208.8 lb

## 2023-09-05 DIAGNOSIS — J4520 Mild intermittent asthma, uncomplicated: Secondary | ICD-10-CM

## 2023-09-05 DIAGNOSIS — F17218 Nicotine dependence, cigarettes, with other nicotine-induced disorders: Secondary | ICD-10-CM | POA: Diagnosis not present

## 2023-09-05 DIAGNOSIS — J3089 Other allergic rhinitis: Secondary | ICD-10-CM | POA: Diagnosis not present

## 2023-09-05 MED ORDER — ALBUTEROL SULFATE HFA 108 (90 BASE) MCG/ACT IN AERS: 1.0000 | INHALATION_SPRAY | Freq: Four times a day (QID) | RESPIRATORY_TRACT | 1 refills | Status: AC | PRN

## 2023-09-05 MED ORDER — FLUTICASONE PROPIONATE 50 MCG/ACT NA SUSP: 2.0000 | Freq: Every day | NASAL | 5 refills | Status: DC

## 2023-09-05 NOTE — Patient Instructions (Addendum)
Other Allergic Rhinitis: - Use nasal saline rinses before nose sprays such as with Neilmed Sinus Rinse.  Use distilled water.   - Use Flonase 2 sprays each nostril daily. Aim upward and outward. - Use Zyrtec 10 mg or Allegra 180mg  daily.  Hold 3 days prior to next visit.   Mild Intermittent Asthma: - Maintenance inhaler: none  - Rescue inhaler: Albuterol 2 puffs via spacer or 1 vial via nebulizer every 4-6 hours as needed for respiratory symptoms of cough, shortness of breath, or wheezing Asthma control goals:  Full participation in all desired activities (may need albuterol before activity) Albuterol use two times or less a week on average (not counting use with activity) Cough interfering with sleep two times or less a month Oral steroids no more than once a year No hospitalizations   Tobacco Use - Work on smoking cessation as that is the best thing you can do for your health.     Follow up with me next Monday 10/28 at 830 AM

## 2023-09-05 NOTE — Progress Notes (Signed)
NEW PATIENT  Date of Service/Encounter:  09/05/23  Consult requested by: Ladora Daniel, PA-C   Subjective:   Linda [name] (DOB: 04-30-1991) is a 32 y.o. female who presents to the clinic on 09/05/2023 with a chief complaint of allergies.  .    History obtained from: chart review and patient.   Asthma:  Diagnosed at a young age.  Only has symptoms when allergies are really bad with shortness of breath and cough.   rarely daytime symptoms in past month, none nighttime awakenings in past month Using rescue inhaler: last use was a month ago Limitations to daily activity: none 0 ED visits/UC visits and 0 oral steroids in the past year 0 number of lifetime hospitalizations, 0 number of lifetime intubations.  Identified Triggers:  allergies Prior PFTs or spirometry: none Previously used therapies: none Current regimen:  Maintenance: none Rescue: Albuterol 2 puffs q4-6 hrs PRN  Rhinitis:  Started since childhood.  Symptoms include:  ear fullness, headaches, nasal congestion, rhinorrhea, post nasal drainage, watery eyes, and itchy nose  Occurs seasonally-Summer Potential triggers: not sure  Treatments tried:  Claritin/Allegra/Zyrtec Flonase PRN  Previous allergy testing: no History of sinus surgery: no Nonallergic triggers: none   Past Medical History: Past Medical History:  Diagnosis Date   ADD (attention deficit disorder)    Asthma    Fibromyalgia    GAD (generalized anxiety disorder)    GERD (gastroesophageal reflux disease)    Hashimoto's thyroiditis    Heart murmur    IBS (irritable bowel syndrome)    Interstitial cystitis    postpartum depression    Past Surgical History: Past Surgical History:  Procedure Laterality Date   APPENDECTOMY     CESAREAN SECTION N/A 03/20/2013   Procedure: CESAREAN SECTION;  Surgeon: [name], MD;  Location: [facility];  Service: Obstetrics;  Laterality: N/A;   CESAREAN SECTION     gastric     gasric bypass    LAPAROSCOPIC APPENDECTOMY N/A 10/16/2019   Procedure: APPENDECTOMY LAPAROSCOPIC;  Surgeon: Franky Macho, MD;  Location: [facility];  Service: General;  Laterality: N/A;   MYRINGOTOMY WITH TUBE PLACEMENT     NO PAST SURGERIES     TYMPANOSTOMY TUBE PLACEMENT     UPPER GI ENDOSCOPY      Family History: Family History  Problem Relation Age of Onset   Stroke Paternal Grandfather    Dementia Paternal Grandmother    Depression Maternal Grandmother    COPD Maternal Grandfather    Autoimmune disease Father    Depression Mother    OCD Mother    Anxiety disorder Mother    Rheum arthritis Mother    Fibromyalgia Mother    Anxiety disorder Brother    Healthy Brother    ADD / ADHD Son    Healthy Son    Drug abuse Maternal Uncle    Depression Maternal Uncle    Anxiety disorder Maternal Uncle    Cancer Paternal Uncle    Breast cancer Maternal Aunt     Social History:  Flooring in bedroom: wood Pets: dog Tobacco use/exposure: 1/2 ppd, 10 year history  Job: hair stylist   Medication List:  Allergies as of 09/05/2023       Reactions   Banana Swelling   "Mouth swells up"   Lorazepam Other (See Comments)   Confusion   Buspar [buspirone] Anxiety   Ciprofloxacin Nausea Only   Levaquin [levofloxacin] Anxiety   Sulfa Antibiotics Diarrhea, Nausea And Vomiting  Medication List        Accurate as of September 05, 2023  9:20 AM. If you have any questions, ask your nurse or doctor.          STOP taking these medications    amoxicillin 500 MG capsule Commonly known as: AMOXIL Stopped by: Birder Robson   ciprofloxacin-dexamethasone OTIC suspension Commonly known as: Ciprodex Stopped by: Birder Robson       TAKE these medications    albuterol 108 (90 Base) MCG/ACT inhaler Commonly known as: VENTOLIN HFA Inhale 1-2 puffs into the lungs every 6 (six) hours as needed for wheezing or shortness of breath.   citalopram 20 MG tablet Commonly known as: CELEXA Take as  directed, 20 mg per day 1st 2 weeks of cycle, then 40 mg a day until starts bleeding   fluticasone 50 MCG/ACT nasal spray Commonly known as: FLONASE Place 2 sprays into both nostrils daily.   lamoTRIgine 100 MG tablet Commonly known as: LAMICTAL Take 100 mg by mouth 2 (two) times daily.   levothyroxine 150 MCG tablet Commonly known as: SYNTHROID Take 1 tablet (150 mcg total) by mouth daily at 6 (six) AM.   nystatin powder Commonly known as: MYCOSTATIN/NYSTOP Apply 1 Application topically 3 (three) times daily.   nystatin cream Commonly known as: MYCOSTATIN Apply 1 Application topically 2 (two) times daily.   omeprazole 40 MG capsule Commonly known as: PRILOSEC Take 40 mg by mouth daily.         REVIEW OF SYSTEMS: Pertinent positives and negatives discussed in HPI.   Objective:   Physical Exam: BP 112/70   Pulse 84   Temp 98.8 F (37.1 C)   Ht 5' 6.14" (1.68 m)   Wt 208 lb 12.8 oz (94.7 kg)   SpO2 97%   BMI 33.56 kg/m  Body mass index is 33.56 kg/m. GEN: alert, well developed HEENT: clear conjunctiva, TM grey and translucent, nose with + mild inferior turbinate hypertrophy, pink nasal mucosa, slight clear rhinorrhea, no cobblestoning HEART: regular rate and rhythm, no murmur LUNGS: clear to auscultation bilaterally, no coughing, unlabored respiration ABDOMEN: soft, non distended  SKIN: no rashes or lesions  Reviewed:  08/22/2023: followed by [facility] for hypothyroidism. On levothyroxine .  06/22/2023: followed by [facility] for iron deficiency anemia. Discussed lab testing and possibility of Feaheme therapy.   07/11/2023: seen by Psych for recurrent MDD, anxiety disorder, MTHFR mutation. On lamictal.   Spirometry:  Tracings reviewed. Her effort: It was hard to get consistent efforts and there is a question as to whether this reflects a maximal maneuver. FVC: 3.69L FEV1: 2.47L, 73% predicted FEV1/FVC ratio: 67% Interpretation: Spirometry consistent  with mild obstructive disease.  Please see scanned spirometry results for details.   Assessment:   1. Other allergic rhinitis   2. Mild intermittent asthma without complication   3. Cigarette nicotine dependence with other nicotine-induced disorder     Plan/Recommendations:  Other Allergic Rhinitis: - Due to turbinate hypertrophy, seasonal symptoms and unresponsive to over the counter meds, will perform skin testing to identify aeroallergen triggers.   - Use nasal saline rinses before nose sprays such as with Neilmed Sinus Rinse.  Use distilled water.   - Use Flonase 2 sprays each nostril daily. Aim upward and outward. - Use Zyrtec 10 mg or Allegra 180mg  daily.  Hold 3 days prior to next visit.   Mild Intermittent Asthma: - MDI technique discussed.  Rarely symptomatic with infrequent albuterol use.  Not  the best effort on spirometry with some obstruction.  - Maintenance inhaler: none  - Rescue inhaler: Albuterol 2 puffs via spacer or 1 vial via nebulizer every 4-6 hours as needed for respiratory symptoms of cough, shortness of breath, or wheezing Asthma control goals:  Full participation in all desired activities (may need albuterol before activity) Albuterol use two times or less a week on average (not counting use with activity) Cough interfering with sleep two times or less a month Oral steroids no more than once a year No hospitalizations   Tobacco Use - Work on smoking cessation as that is the best thing you can do for your health.   Patient provided counseling on tobacco cessation today. Discussed the various impacts smoking has on health such as increased infections, lung disease, malignancy, heart disease, stroke etc. Patient is alert and competent.   Willingness to quit: thinking about it but not sure  Methods/skills for cessation: on her own  Medication management for smoking: none Resources provided include discussion of harmful effects  Quit date: n/a Will discuss  further at next follow up visit. Spent over 3 minutes for tobacco cessation counseling.        Follow up with me next Monday at 830 AM   Alesia Morin, MD [facility]

## 2023-09-12 ENCOUNTER — Ambulatory Visit: Payer: Medicaid Other | Admitting: Internal Medicine

## 2023-09-12 VITALS — BP 116/70 | Temp 98.2°F

## 2023-09-12 DIAGNOSIS — J3081 Allergic rhinitis due to animal (cat) (dog) hair and dander: Secondary | ICD-10-CM

## 2023-09-12 DIAGNOSIS — J301 Allergic rhinitis due to pollen: Secondary | ICD-10-CM

## 2023-09-12 DIAGNOSIS — J3089 Other allergic rhinitis: Secondary | ICD-10-CM | POA: Diagnosis not present

## 2023-09-12 MED ORDER — CETIRIZINE HCL 10 MG PO TABS: 10.0000 mg | ORAL_TABLET | Freq: Every day | ORAL | 5 refills | Status: AC

## 2023-09-12 MED ORDER — AZELASTINE HCL 0.1 % NA SOLN: 1.0000 | Freq: Two times a day (BID) | NASAL | 5 refills | Status: DC | PRN

## 2023-09-12 NOTE — Progress Notes (Signed)
FOLLOW UP Date of Service/Encounter:  09/12/23   Subjective:  Linda [name] (DOB: 1991/11/10) is a 32 y.o. female who [facility] for follow up for skin testing.   History obtained from: chart review and patient.  Anti histamines held. Not sick.  Past Medical History: Past Medical History:  Diagnosis Date   ADD (attention deficit disorder)    Asthma    Fibromyalgia    GAD (generalized anxiety disorder)    GERD (gastroesophageal reflux disease)    Hashimoto's thyroiditis    Heart murmur    IBS (irritable bowel syndrome)    Interstitial cystitis    postpartum depression     Objective:  BP 116/70   Temp 98.2 F (36.8 C)  There is no height or weight on file to calculate BMI. Physical Exam: GEN: alert, well developed HEENT: clear conjunctiva, MMM LUNGS: no coughing, unlabored respiration SKIN: no rashes or lesions   Skin Testing:  Skin prick testing was placed, which includes aeroallergens/foods, histamine control, and saline control.  Verbal consent was obtained prior to placing test.  Patient tolerated procedure well.  Allergy testing results were read and interpreted by myself, documented by clinical staff. Adequate positive and negative control.  Positive results to:  Results discussed with patient/family.  Airborne Adult Perc - 09/12/23 0847     Time Antigen Placed 0847    Allergen Manufacturer Waynette Buttery    Location Back    Number of Test 55    1. Control-Buffer 50% Glycerol Negative    2. Control-Histamine 3+    3. Bahia 3+    4. [place] 3+    5. Johnson 3+    6. Kentucky Blue 3+    7. Meadow Fescue 3+    8. Perennial Rye 3+    9. Timothy 3+    10. Ragweed Mix 3+    11. Cocklebur 3+    12. Plantain,  English 3+    13. Baccharis Negative    14. Dog Fennel 3+    15. Russian Thistle 3+    16. Lamb's Quarters Negative    17. Sheep Sorrell 3+    18. Rough Pigweed 3+    19. Marsh Elder, Rough 3+    20.  Mugwort, Common 3+    21. Box, Elder 3+    22. Cedar, red 3+    23. Sweet Gum 3+    24. Pecan Pollen 3+    25. Pine Mix 2+    26. Walnut, Black Pollen 2+    27. Red Mulberry 2+    28. Ash Mix 3+    29. Birch Mix 3+    30. Beech American 3+    31. Cottonwood, Eastern 3+    32. Hickory, White 3+    33. Maple Mix 3+    34. Oak, Guinea-Bissau Mix 3+    35. Sycamore Eastern 3+    36. Alternaria Alternata 3+    37. Cladosporium Herbarum 3+    38. Aspergillus Mix 3+    39. Penicillium Mix Negative    40. Bipolaris Sorokiniana (Helminthosporium) Negative    41. Drechslera Spicifera (Curvularia) 3+    42. Mucor Plumbeus Negative    43. Fusarium Moniliforme Negative    44. Aureobasidium Pullulans (pullulara) 2+    45. Rhizopus Oryzae Negative    46. Botrytis Cinera 3+    47. Epicoccum Nigrum Negative    48. Phoma Betae 3+    49.  Dust Mite Mix Negative    50. Cat Hair 10,000 BAU/ml 3+    51.  Dog Epithelia Negative    52. Mixed Feathers Negative    53. Horse Epithelia 3+    54. Cockroach, German Negative    55. Tobacco Leaf 3+              Assessment:   1. Allergic rhinitis caused by mold   2. Seasonal allergic rhinitis due to pollen   3. Allergic rhinitis due to animal hair or dander     Plan/Recommendations:  Allergic Rhinitis: - Due to turbinate hypertrophy, seasonal symptoms and unresponsive to over the counter meds, will perform skin testing to identify aeroallergen triggers.   - Positive skin test 08/2023: trees, grasses, weeds, molds, cats, horses, tobacco leaf  - Avoidance measures discussed. - Use nasal saline rinses before nose sprays such as with Neilmed Sinus Rinse.  Use distilled water.   - Use Flonase 2 sprays each nostril daily. Aim upward and outward. - Use Azelastine 1-2 sprays each nostril twice daily as needed for runny nose, drainage, sneezing, congestion. Aim upward and outward. - Use Zyrtec 10 mg daily or Allegra 180mg  daily.  - Consider allergy shots  as long term control of your symptoms by teaching your immune system to be more tolerant of your allergy triggers   Mild Intermittent Asthma: - Maintenance inhaler: none  - Rescue inhaler: Albuterol 2 puffs via spacer or 1 vial via nebulizer every 4-6 hours as needed for respiratory symptoms of cough, shortness of breath, or wheezing Asthma control goals:  Full participation in all desired activities (may need albuterol before activity) Albuterol use two times or less a week on average (not counting use with activity) Cough interfering with sleep two times or less a month Oral steroids no more than once a year No hospitalizations   Tobacco Use - Work on smoking cessation as that is the best thing you can do for your health.     Follow up with me next Monday 10/28 at 830 AM     Return in about 6 weeks (around 10/24/2023).  Alesia Morin, MD [facility]

## 2023-09-12 NOTE — Patient Instructions (Addendum)
Allergic Rhinitis: - Positive skin test 08/2023: trees, grasses, weeds, molds, cats, horses, tobacco leaf  - Avoidance measures discussed. - Use nasal saline rinses before nose sprays such as with Neilmed Sinus Rinse.  Use distilled water.   - Use Flonase 2 sprays each nostril daily. Aim upward and outward. - Use Azelastine 1-2 sprays each nostril twice daily as needed for runny nose, drainage, sneezing, congestion. Aim upward and outward. - Use Zyrtec 10 mg daily or Allegra 180mg  daily.  - Consider allergy shots as long term control of your symptoms by teaching your immune system to be more tolerant of your allergy triggers   Mild Intermittent Asthma: - Maintenance inhaler: none  - Rescue inhaler: Albuterol 2 puffs via spacer or 1 vial via nebulizer every 4-6 hours as needed for respiratory symptoms of cough, shortness of breath, or wheezing Asthma control goals:  Full participation in all desired activities (may need albuterol before activity) Albuterol use two times or less a week on average (not counting use with activity) Cough interfering with sleep two times or less a month Oral steroids no more than once a year No hospitalizations   Tobacco Use - Work on smoking cessation as that is the best thing you can do for your health.     ALLERGEN AVOIDANCE MEASURES Molds - Indoor avoidance Use air conditioning to reduce indoor humidity.  Do not use a humidifier. Keep indoor humidity at 30 - 40%.  Use a dehumidifier if needed. In the bathroom use an exhaust fan or open a window after showering.  Wipe down damp surfaces after showering.  Clean bathrooms with a mold-killing solution (diluted bleach, or products like Tilex, etc) at least once a month. In the kitchen use an exhaust fan to remove steam from cooking.  Throw away spoiled foods immediately, and empty garbage daily.  Empty water pans below self-defrosting refrigerators frequently. Vent the clothes dryer to the outside. Limit  indoor houseplants; mold grows in the dirt.  No houseplants in the bedroom. Remove carpet from the bedroom. Encase the mattress and box springs with a zippered encasing.  Molds - Outdoor avoidance Avoid being outside when the grass is being mowed, or the ground is tilled. Avoid playing in leaves, pine straw, hay, etc.  Dead plant materials contain mold. Avoid going into barns or grain storage areas. Remove leaves, clippings and compost from around the home.  Pollen Avoidance Pollen levels are highest during the mid-day and afternoon.  Consider this when planning outdoor activities. Avoid being outside when the grass is being mowed, or wear a mask if the pollen-allergic person must be the one to mow the grass. Keep the windows closed to keep pollen outside of the home. Use an air conditioner to filter the air. Take a shower, wash hair, and change clothing after working or playing outdoors during pollen season. Pet Dander Keep the pet out of your bedroom and restrict it to only a few rooms. Be advised that keeping the pet in only one room will not limit the allergens to that room. Don't pet, hug or kiss the pet; if you do, wash your hands with soap and water. High-efficiency particulate air (HEPA) cleaners run continuously in a bedroom or living room can reduce allergen levels over time. Regular use of a high-efficiency vacuum cleaner or a central vacuum can reduce allergen levels. Giving your pet a bath at least once a week can reduce airborne allergen.

## 2023-10-31 ENCOUNTER — Ambulatory Visit: Payer: Medicaid Other | Admitting: Internal Medicine

## 2023-11-17 ENCOUNTER — Encounter (HOSPITAL_COMMUNITY): Payer: Self-pay

## 2023-11-17 ENCOUNTER — Emergency Department (HOSPITAL_COMMUNITY)
Admission: EM | Admit: 2023-11-17 | Discharge: 2023-11-18 | Disposition: A | Discharge status: Home / Self Care | Payer: Medicaid Other | Attending: Emergency Medicine | Admitting: Emergency Medicine

## 2023-11-17 ENCOUNTER — Other Ambulatory Visit: Payer: Self-pay

## 2023-11-17 DIAGNOSIS — R1011 Right upper quadrant pain: Secondary | ICD-10-CM | POA: Diagnosis present

## 2023-11-17 DIAGNOSIS — R1013 Epigastric pain: Secondary | ICD-10-CM | POA: Diagnosis not present

## 2023-11-17 DIAGNOSIS — K2900 Acute gastritis without bleeding: Secondary | ICD-10-CM

## 2023-11-17 LAB — URINALYSIS, ROUTINE W REFLEX MICROSCOPIC
Bilirubin Urine: NEGATIVE
Glucose, UA: NEGATIVE mg/dL
Hgb urine dipstick: NEGATIVE
Ketones, ur: NEGATIVE mg/dL
Leukocytes,Ua: NEGATIVE
Nitrite: NEGATIVE
Protein, ur: NEGATIVE mg/dL
Specific Gravity, Urine: 1.003 — ABNORMAL LOW (ref 1.005–1.030)
pH: 7 (ref 5.0–8.0)

## 2023-11-17 LAB — CBC
HCT: 40.8 % (ref 36.0–46.0)
Hemoglobin: 13.2 g/dL (ref 12.0–15.0)
MCH: 30.3 pg (ref 26.0–34.0)
MCHC: 32.4 g/dL (ref 30.0–36.0)
MCV: 93.8 fL (ref 80.0–100.0)
Platelets: 312 10*3/uL (ref 150–400)
RBC: 4.35 MIL/uL (ref 3.87–5.11)
RDW: 13.4 % (ref 11.5–15.5)
WBC: 10.3 10*3/uL (ref 4.0–10.5)
nRBC: 0 % (ref 0.0–0.2)

## 2023-11-17 LAB — COMPREHENSIVE METABOLIC PANEL
ALT: 39 U/L (ref 0–44)
AST: 24 U/L (ref 15–41)
Albumin: 4.1 g/dL (ref 3.5–5.0)
Alkaline Phosphatase: 95 U/L (ref 38–126)
Anion gap: 7 (ref 5–15)
BUN: 8 mg/dL (ref 6–20)
CO2: 27 mmol/L (ref 22–32)
Calcium: 9.5 mg/dL (ref 8.9–10.3)
Chloride: 104 mmol/L (ref 98–111)
Creatinine, Ser: 0.65 mg/dL (ref 0.44–1.00)
GFR, Estimated: >60 mL/min (ref >60)
Glucose, Bld: 124 mg/dL — ABNORMAL HIGH (ref 70–99)
Potassium: 3.9 mmol/L (ref 3.5–5.1)
Sodium: 138 mmol/L (ref 135–145)
Total Bilirubin: 0.6 mg/dL (ref 0.0–1.2)
Total Protein: 7.8 g/dL (ref 6.5–8.1)

## 2023-11-17 LAB — PREGNANCY, URINE: Preg Test, Ur: NEGATIVE

## 2023-11-17 LAB — LIPASE, BLOOD: Lipase: 30 U/L (ref 11–51)

## 2023-11-17 NOTE — ED Triage Notes (Signed)
 Pt presents to ED from home C/O RUQ abdominal pain starting about 4 hours ago. Endorses nausea, denies vomiting.

## 2023-11-18 ENCOUNTER — Emergency Department (HOSPITAL_COMMUNITY): Payer: Medicaid Other

## 2023-11-18 MED ORDER — ALUM & MAG HYDROXIDE-SIMETH 200-200-20 MG/5ML PO SUSP
30.0000 mL | Freq: Once | ORAL | Status: AC
  Administered 2023-11-18: 30 mL via ORAL
  Filled 2023-11-18: qty 30

## 2023-11-18 MED ORDER — ONDANSETRON HCL 4 MG/2ML IJ SOLN
4.0000 mg | Freq: Once | INTRAMUSCULAR | Status: AC
Start: 2023-11-18 — End: 2023-11-18
  Administered 2023-11-18: 4 mg via INTRAVENOUS
  Filled 2023-11-18: qty 2

## 2023-11-18 MED ORDER — FAMOTIDINE 20 MG PO TABS
40.0000 mg | ORAL_TABLET | Freq: Once | ORAL | Status: AC
  Administered 2023-11-18: 40 mg via ORAL
  Filled 2023-11-18: qty 2

## 2023-11-18 MED ORDER — PANTOPRAZOLE SODIUM 40 MG PO TBEC: 40.0000 mg | DELAYED_RELEASE_TABLET | Freq: Every day | ORAL | 3 refills | Status: AC

## 2023-11-18 MED ORDER — SUCRALFATE 1 G PO TABS: 1.0000 g | ORAL_TABLET | Freq: Three times a day (TID) | ORAL | 0 refills | Status: DC

## 2023-11-18 MED ORDER — MORPHINE SULFATE (PF) 4 MG/ML IV SOLN
4.0000 mg | Freq: Once | INTRAVENOUS | Status: AC
  Administered 2023-11-18: 4 mg via INTRAVENOUS
  Filled 2023-11-18: qty 1

## 2023-11-18 MED ORDER — IOHEXOL 300 MG/ML  SOLN
100.0000 mL | Freq: Once | INTRAMUSCULAR | Status: AC | PRN
  Administered 2023-11-18: 100 mL via INTRAVENOUS

## 2023-11-18 NOTE — ED Provider Notes (Signed)
 [place] EMERGENCY DEPARTMENT AT [facility] Provider Note   CSN: [id number] Arrival date & time: 11/17/23  1811     History  No chief complaint on file.   Linda [name] is a 33 y.o. female.  Patient presents to the emergency department for evaluation of abdominal pain.  Patient with upper abdominal pain that started around 3 PM today and has been persistent.  She reports that it worsens when she eats.  She has had nausea but no vomiting.  She does have a history of gastric bypass.  She has a gallbladder in place, has had prior appendectomy.       Home Medications Prior to Admission medications   Medication Sig Start Date End Date Taking? Authorizing Provider  pantoprazole  (PROTONIX ) 40 MG tablet Take 1 tablet (40 mg total) by mouth daily. 11/18/23  Yes Keishla Oyer, Lonni PARAS, MD  sucralfate  (CARAFATE ) 1 g tablet Take 1 tablet (1 g total) by mouth 4 (four) times daily -  with meals and at bedtime. 11/18/23  Yes Areebah Meinders, Lonni PARAS, MD  albuterol  (VENTOLIN  HFA) 108 (90 Base) MCG/ACT inhaler Inhale 1-2 puffs into the lungs every 6 (six) hours as needed for wheezing or shortness of breath. 09/05/23   Tobie Arleta SQUIBB, MD  azelastine  (ASTELIN ) 0.1 % nasal spray Place 1 spray into both nostrils 2 (two) times daily as needed. Use in each nostril as directed 09/12/23   Tobie Arleta SQUIBB, MD  cetirizine  (ZYRTEC  ALLERGY ) 10 MG tablet Take 1 tablet (10 mg total) by mouth daily. 09/12/23   Tobie Arleta SQUIBB, MD  citalopram  (CELEXA ) 20 MG tablet Take as directed, 20 mg per day 1st 2 weeks of cycle, then 40 mg a day until starts bleeding 02/03/23   Jayne Vonn DEL, MD  fluticasone  (FLONASE ) 50 MCG/ACT nasal spray Place 2 sprays into both nostrils daily. 09/05/23   Tobie Arleta SQUIBB, MD  lamoTRIgine (LAMICTAL) 100 MG tablet Take 100 mg by mouth 2 (two) times daily.    [provider]  levothyroxine  (SYNTHROID ) 150 MCG tablet Take 1 tablet (150 mcg total) by mouth daily at 6 (six) AM.  06/01/22   Evelena Figures, MD  nystatin  (MYCOSTATIN /NYSTOP ) powder Apply 1 Application topically 3 (three) times daily. 08/25/22   Lavell Bari LABOR, FNP  nystatin  cream (MYCOSTATIN ) Apply 1 Application topically 2 (two) times daily. 08/25/22   Lavell Bari LABOR, FNP  omeprazole (PRILOSEC) 40 MG capsule Take 40 mg by mouth daily. 03/08/22   [provider]      Allergies    Banana, Lorazepam , Buspar  [buspirone ], Ciprofloxacin , Levaquin  [levofloxacin ], and Sulfa antibiotics    Review of Systems   Review of Systems  Physical Exam Updated Vital Signs BP 112/69 (BP Location: Right Arm)   Pulse 67   Temp 98.1 F (36.7 C)   Resp 14   Ht 5' 6 (1.676 m)   Wt 92.1 kg   SpO2 100%   BMI 32.77 kg/m  Physical Exam Vitals and nursing note reviewed.  Constitutional:      General: She is not in acute distress.    Appearance: She is well-developed.  HENT:     Head: Normocephalic and atraumatic.     Mouth/Throat:     Mouth: Mucous membranes are moist.  Eyes:     General: Vision grossly intact. Gaze aligned appropriately.     Extraocular Movements: Extraocular movements intact.     Conjunctiva/sclera: Conjunctivae normal.  Cardiovascular:     Rate and Rhythm: Normal  rate and regular rhythm.     Pulses: Normal pulses.     Heart sounds: Normal heart sounds, S1 normal and S2 normal. No murmur heard.    No friction rub. No gallop.  Pulmonary:     Effort: Pulmonary effort is normal. No respiratory distress.     Breath sounds: Normal breath sounds.  Abdominal:     General: Bowel sounds are normal.     Palpations: Abdomen is soft.     Tenderness: There is abdominal tenderness in the epigastric area. There is no guarding or rebound. Negative signs include Murphy's sign.     Hernia: No hernia is present.  Musculoskeletal:        General: No swelling.     Cervical back: Full passive range of motion without pain, normal range of motion and neck supple. No spinous process tenderness or  muscular tenderness. Normal range of motion.     Right lower leg: No edema.     Left lower leg: No edema.  Skin:    General: Skin is warm and dry.     Capillary Refill: Capillary refill takes less than 2 seconds.     Findings: No ecchymosis, erythema, rash or wound.  Neurological:     General: No focal deficit present.     Mental Status: She is alert and oriented to person, place, and time.     GCS: GCS eye subscore is 4. GCS verbal subscore is 5. GCS motor subscore is 6.     Cranial Nerves: Cranial nerves 2-12 are intact.     Sensory: Sensation is intact.     Motor: Motor function is intact.     Coordination: Coordination is intact.  Psychiatric:        Attention and Perception: Attention normal.        Mood and Affect: Mood normal.        Speech: Speech normal.        Behavior: Behavior normal.     ED Results / Procedures / Treatments   Labs (all labs ordered are listed, but only abnormal results are displayed) Labs Reviewed  COMPREHENSIVE METABOLIC PANEL - Abnormal; Notable for the following components:      Result Value   Glucose, Bld 124 (*)    All other components within normal limits  URINALYSIS, ROUTINE W REFLEX MICROSCOPIC - Abnormal; Notable for the following components:   Color, Urine STRAW (*)    Specific Gravity, Urine 1.003 (*)    All other components within normal limits  LIPASE, BLOOD  CBC  PREGNANCY, URINE    EKG None  Radiology CT ABDOMEN PELVIS W CONTRAST Result Date: 11/18/2023 CLINICAL DATA:  Abdominal pain.  Nausea. EXAM: CT ABDOMEN AND PELVIS WITH CONTRAST TECHNIQUE: Multidetector CT imaging of the abdomen and pelvis was performed using the standard protocol following bolus administration of intravenous contrast. RADIATION DOSE REDUCTION: This exam was performed according to the departmental dose-optimization program which includes automated exposure control, adjustment of the mA and/or kV according to patient size and/or use of iterative  reconstruction technique. CONTRAST:  OMNIPAQUE  IOHEXOL  300 MG/ML  SOLN COMPARISON:  CT 10/16/2019 FINDINGS: Lower chest: Clear lung bases. Hepatobiliary: Liver is enlarged spanning 21.2 cm borderline hepatic steatosis. No focal liver lesion. Gallbladder physiologically distended, no calcified stone. No biliary dilatation. Pancreas: No ductal dilatation or inflammation. Spleen: Normal in size without focal abnormality. Adrenals/Urinary Tract: No adrenal nodule. No hydronephrosis or renal calculi. No perinephric fat stranding. Unremarkable urinary bladder. Stomach/Bowel: Gastric bypass  anatomy. Small amount of fluid in the excluded gastric remnant. The Roux limb is patulous. The jejunal anastomosis is unremarkable. There is no small bowel obstruction or inflammation. Appendectomy. Small to moderate volume of stool in the colon. Vascular/Lymphatic: No acute vascular findings. Normal caliber abdominal aorta. Patent portal vein. No adenopathy. Reproductive: Unremarkable appearance of the uterus. The right ovary is normal. There is a small physiologic corpus luteal cyst in the left ovary. No further follow-up imaging is recommended. Other: No free air or free fluid. Musculoskeletal: Posterior disc protrusion at L4-L5. There are no acute or suspicious osseous abnormalities. IMPRESSION: 1. No acute abnormality in the abdomen/pelvis. 2. Gastric bypass anatomy. Small amount of fluid in the excluded gastric remnant, can be seen with duodenal reflux or gastrogastric fistula. 3. Hepatomegaly with borderline hepatic steatosis. Electronically Signed   By: Andrea Gasman M.D.   On: 11/18/2023 02:10    Procedures Procedures    Medications Ordered in ED Medications  famotidine  (PEPCID ) tablet 40 mg (has no administration in time range)  alum & mag hydroxide-simeth (MAALOX/MYLANTA) 200-200-20 MG/5ML suspension 30 mL (has no administration in time range)  morphine  (PF) 4 MG/ML injection 4 mg (4 mg Intravenous Given  11/18/23 0130)  ondansetron  (ZOFRAN ) injection 4 mg (4 mg Intravenous Given 11/18/23 0130)  iohexol  (OMNIPAQUE ) 300 MG/ML solution 100 mL (100 mLs Intravenous Contrast Given 11/18/23 0137)    ED Course/ Medical Decision Making/ A&P                                 Medical Decision Making Amount and/or Complexity of Data Reviewed Labs: ordered. Radiology: ordered.  Risk Prescription drug management.   Differential Diagnosis considered includes, but not limited to: Cholelithiasis; cholecystitis; cholangitis; bowel obstruction; esophagitis; gastritis; peptic ulcer disease; pancreatitis; cardiac.  Presents with upper abdominal discomfort.  Abdominal exam is benign.  She has had nausea but no vomiting.  Pain worsens with eating.  She has only had the pain since 3 PM today, has not experienced this previously.  Patient has had prior gastric bypass and also does still have a gallbladder.  Lab work was unremarkable.  She underwent CT abdomen and pelvis and there is no acute pathology, no problems with prior gastric bypass.  Will treat for gastritis.        Final Clinical Impression(s) / ED Diagnoses Final diagnoses:  None    Rx / DC Orders ED Discharge Orders          Ordered    pantoprazole  (PROTONIX ) 40 MG tablet  Daily        11/18/23 0214    sucralfate  (CARAFATE ) 1 g tablet  3 times daily with meals & bedtime        11/18/23 0214              Haze Lonni PARAS, MD 11/18/23 [phone]

## 2023-12-19 ENCOUNTER — Other Ambulatory Visit (HOSPITAL_COMMUNITY)
Admission: RE | Admit: 2023-12-19 | Discharge: 2023-12-19 | Disposition: A | Discharge status: Home / Self Care | Payer: Medicaid Other | Source: Ambulatory Visit | Attending: Adult Health | Admitting: Adult Health

## 2023-12-19 ENCOUNTER — Encounter: Payer: Self-pay | Admitting: Adult Health

## 2023-12-19 ENCOUNTER — Ambulatory Visit: Payer: Medicaid Other | Admitting: Adult Health

## 2023-12-19 VITALS — BP 104/70 | HR 71 | Ht 65.5 in | Wt 219.5 lb

## 2023-12-19 DIAGNOSIS — N921 Excessive and frequent menstruation with irregular cycle: Secondary | ICD-10-CM | POA: Diagnosis not present

## 2023-12-19 DIAGNOSIS — Z8742 Personal history of other diseases of the female genital tract: Secondary | ICD-10-CM

## 2023-12-19 DIAGNOSIS — Z113 Encounter for screening for infections with a predominantly sexual mode of transmission: Secondary | ICD-10-CM | POA: Diagnosis not present

## 2023-12-19 DIAGNOSIS — Z3202 Encounter for pregnancy test, result negative: Secondary | ICD-10-CM

## 2023-12-19 DIAGNOSIS — R5383 Other fatigue: Secondary | ICD-10-CM

## 2023-12-19 LAB — POCT URINE PREGNANCY: Preg Test, Ur: NEGATIVE

## 2023-12-19 MED ORDER — MEGESTROL ACETATE 40 MG PO TABS: ORAL_TABLET | ORAL | 1 refills | Status: AC

## 2023-12-19 NOTE — Progress Notes (Signed)
  Subjective:     Patient ID: Linda [name], female   DOB: 22-Nov-1990, 33 y.o.   MRN: [id number]  HPI Linda [name] is a 33 year old white female, married, G1P0101, in complaining of irregular and heavy bleeding for 3 weeks now. Has cyst left ovary 11/18/23 on CT, looks like corpus luteal cyst, and had fatty liver and stool in colon.      Component Value Date/Time   DIAGPAP  02/03/2023 1047    - Negative for intraepithelial lesion or malignancy (NILM)   HPVHIGH Negative 02/03/2023 1047   ADEQPAP  02/03/2023 1047    Satisfactory for evaluation; transformation zone component PRESENT.    PCP is Ladora Daniel PA  Review of Systems  +irregular and heavy bleeding for 3 weeks now. On heaviest days will change tampon every hour  +tired  Reviewed past medical,surgical, social and family history. Reviewed medications and allergies.     Objective:   Physical Exam BP 104/70 (BP Location: Left Arm, Patient Position: Sitting, Cuff Size: Normal)   Pulse 71   Ht 5' 5.5" (1.664 m)   Wt 219 lb 8 oz (99.6 kg)   LMP 11/30/2023   BMI 35.97 kg/m     UPT is negative Skin warm and dry.Pelvic: external genitalia is normal in appearance no lesions, vagina: +blood,urethra has no lesions or masses noted, cervix:smooth, uterus: normal size, shape and contour, non tender, no masses felt, adnexa: no masses or tenderness noted. Bladder is non tender and no masses felt. CV swab obtained.   Upstream - 12/19/23 4098       Pregnancy Intention Screening   Does the patient want to become pregnant in the next year? No    Does the patient's partner want to become pregnant in the next year? No    Would the patient like to discuss contraceptive options today? No      Contraception Wrap Up   Current Method No Method - Other Reason    End Method No Method - Other Reason    Contraception Counseling Provided No            Examination chaperoned by Malachy Mood LPN  Assessment:     1. Menorrhagia with irregular  cycle (Primary)  +irregular and heavy bleeding for 3 weeks now. On heaviest days will change tampon every hour  Will rx megace to bleeding and check labs, use condoms  Meds ordered this encounter  Medications   megestrol (MEGACE) 40 MG tablet    Sig: Take 3 x 5 days then 2 x 5 days then 1 daily til bleeding stops    Dispense:  45 tablet    Refill:  1    Supervising Provider:   Duane Lope H [[id number]]    - POCT urine pregnancy - CBC - Iron, TIBC and Ferritin Panel  2. Screening examination for STD (sexually transmitted disease) CV swab sent for GC/CHL,trich,BV and yeast  - Cervicovaginal ancillary only( [facility])  3. History of ovarian cyst  4. Pregnancy examination or test, negative result - POCT urine pregnancy  5. Tired +tired will check labs  - CBC - Comprehensive metabolic panel - Iron, TIBC and Ferritin Panel     Plan:     Follow up TBD after labs back

## 2023-12-20 LAB — CBC
Hematocrit: 35.6 % (ref 34.0–46.6)
Hemoglobin: 11.7 g/dL (ref 11.1–15.9)
MCH: 30.8 pg (ref 26.6–33.0)
MCHC: 32.9 g/dL (ref 31.5–35.7)
MCV: 94 fL (ref 79–97)
Platelets: 313 10*3/uL (ref 150–450)
RBC: 3.8 x10E6/uL (ref 3.77–5.28)
RDW: 13.2 % (ref 11.7–15.4)
WBC: 10.6 10*3/uL (ref 3.4–10.8)

## 2023-12-20 LAB — COMPREHENSIVE METABOLIC PANEL
ALT: 29 [IU]/L (ref 0–32)
AST: 19 [IU]/L (ref 0–40)
Albumin: 4 g/dL (ref 3.9–4.9)
Alkaline Phosphatase: 108 [IU]/L (ref 44–121)
BUN/Creatinine Ratio: 13 (ref 9–23)
BUN: 8 mg/dL (ref 6–20)
Bilirubin Total: 0.3 mg/dL (ref 0.0–1.2)
CO2: 23 mmol/L (ref 20–29)
Calcium: 9 mg/dL (ref 8.7–10.2)
Chloride: 104 mmol/L (ref 96–106)
Creatinine, Ser: 0.64 mg/dL (ref 0.57–1.00)
Globulin, Total: 2.1 g/dL (ref 1.5–4.5)
Glucose: 67 mg/dL — ABNORMAL LOW (ref 70–99)
Potassium: 4 mmol/L (ref 3.5–5.2)
Sodium: 141 mmol/L (ref 134–144)
Total Protein: 6.1 g/dL (ref 6.0–8.5)
eGFR: 121 mL/min/{1.73_m2} (ref >59)

## 2023-12-20 LAB — CERVICOVAGINAL ANCILLARY ONLY
Bacterial Vaginitis (gardnerella): NEGATIVE
Candida Glabrata: NEGATIVE
Candida Vaginitis: NEGATIVE
Chlamydia: NEGATIVE
Comment: NEGATIVE
Comment: NEGATIVE
Comment: NEGATIVE
Comment: NEGATIVE
Comment: NEGATIVE
Comment: NORMAL
Neisseria Gonorrhea: NEGATIVE
Trichomonas: NEGATIVE

## 2023-12-20 LAB — IRON,TIBC AND FERRITIN PANEL
Ferritin: 90 ng/mL (ref 15–150)
Iron Saturation: 18 % (ref 15–55)
Iron: 55 ug/dL (ref 27–159)
Total Iron Binding Capacity: 304 ug/dL (ref 250–450)
UIBC: 249 ug/dL (ref 131–425)
